# Patient Record
Sex: Male | Born: 1955 | Race: White | Hispanic: No | Marital: Married | Smoking: Former smoker
Health system: Southern US, Community
[De-identification: ages and names within clinical notes are randomized; demographics above are authoritative.]

## PROBLEM LIST (undated history)

## (undated) DIAGNOSIS — K2270 Barrett's esophagus without dysplasia: Secondary | ICD-10-CM

## (undated) DIAGNOSIS — C801 Malignant (primary) neoplasm, unspecified: Secondary | ICD-10-CM

## (undated) DIAGNOSIS — R7303 Prediabetes: Secondary | ICD-10-CM

## (undated) DIAGNOSIS — B029 Zoster without complications: Secondary | ICD-10-CM

## (undated) DIAGNOSIS — I209 Angina pectoris, unspecified: Secondary | ICD-10-CM

## (undated) DIAGNOSIS — E785 Hyperlipidemia, unspecified: Secondary | ICD-10-CM

## (undated) DIAGNOSIS — J189 Pneumonia, unspecified organism: Secondary | ICD-10-CM

## (undated) DIAGNOSIS — M795 Residual foreign body in soft tissue: Secondary | ICD-10-CM

## (undated) DIAGNOSIS — F32A Depression, unspecified: Secondary | ICD-10-CM

## (undated) DIAGNOSIS — M1990 Unspecified osteoarthritis, unspecified site: Secondary | ICD-10-CM

## (undated) DIAGNOSIS — G709 Myoneural disorder, unspecified: Secondary | ICD-10-CM

## (undated) DIAGNOSIS — Z87891 Personal history of nicotine dependence: Secondary | ICD-10-CM

## (undated) DIAGNOSIS — G35D Multiple sclerosis, unspecified: Secondary | ICD-10-CM

## (undated) DIAGNOSIS — B019 Varicella without complication: Secondary | ICD-10-CM

## (undated) DIAGNOSIS — I7143 Infrarenal abdominal aortic aneurysm, without rupture: Secondary | ICD-10-CM

## (undated) DIAGNOSIS — K219 Gastro-esophageal reflux disease without esophagitis: Secondary | ICD-10-CM

## (undated) DIAGNOSIS — I714 Abdominal aortic aneurysm, without rupture, unspecified: Secondary | ICD-10-CM

## (undated) DIAGNOSIS — Z87442 Personal history of urinary calculi: Secondary | ICD-10-CM

## (undated) DIAGNOSIS — G35 Multiple sclerosis: Secondary | ICD-10-CM

## (undated) DIAGNOSIS — I10 Essential (primary) hypertension: Secondary | ICD-10-CM

## (undated) DIAGNOSIS — H269 Unspecified cataract: Secondary | ICD-10-CM

## (undated) DIAGNOSIS — J45909 Unspecified asthma, uncomplicated: Secondary | ICD-10-CM

## (undated) DIAGNOSIS — K635 Polyp of colon: Secondary | ICD-10-CM

## (undated) DIAGNOSIS — J449 Chronic obstructive pulmonary disease, unspecified: Secondary | ICD-10-CM

## (undated) DIAGNOSIS — K5909 Other constipation: Secondary | ICD-10-CM

## (undated) DIAGNOSIS — R0789 Other chest pain: Secondary | ICD-10-CM

## (undated) DIAGNOSIS — Z7960 Long term (current) use of unspecified immunomodulators and immunosuppressants: Secondary | ICD-10-CM

## (undated) DIAGNOSIS — N400 Enlarged prostate without lower urinary tract symptoms: Secondary | ICD-10-CM

## (undated) DIAGNOSIS — Z79899 Other long term (current) drug therapy: Secondary | ICD-10-CM

## (undated) DIAGNOSIS — N200 Calculus of kidney: Secondary | ICD-10-CM

## (undated) DIAGNOSIS — I2510 Atherosclerotic heart disease of native coronary artery without angina pectoris: Secondary | ICD-10-CM

## (undated) HISTORY — PX: FRACTURE SURGERY: SHX138

## (undated) HISTORY — PX: SEPTOPLASTY: SUR1290

## (undated) HISTORY — PX: WRIST FRACTURE SURGERY: SHX121

## (undated) HISTORY — DX: Calculus of kidney: N20.0

## (undated) HISTORY — PX: CARDIAC CATHETERIZATION: SHX172

## (undated) HISTORY — PX: JOINT REPLACEMENT: SHX530

## (undated) HISTORY — PX: EYE SURGERY: SHX253

---
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
## 1977-03-26 HISTORY — PX: APPENDECTOMY: SHX54

## 2007-06-20 ENCOUNTER — Inpatient Hospital Stay: Payer: Self-pay | Admitting: Psychiatry

## 2007-06-20 ENCOUNTER — Other Ambulatory Visit: Payer: Self-pay

## 2007-06-24 ENCOUNTER — Emergency Department: Payer: Self-pay | Admitting: Emergency Medicine

## 2007-06-24 ENCOUNTER — Ambulatory Visit: Payer: Self-pay | Admitting: Unknown Physician Specialty

## 2007-06-25 ENCOUNTER — Ambulatory Visit: Payer: Self-pay | Admitting: Unknown Physician Specialty

## 2007-07-14 DIAGNOSIS — I2510 Atherosclerotic heart disease of native coronary artery without angina pectoris: Secondary | ICD-10-CM

## 2007-07-14 HISTORY — PX: LEFT HEART CATH AND CORONARY ANGIOGRAPHY: CATH118249

## 2007-07-14 HISTORY — DX: Atherosclerotic heart disease of native coronary artery without angina pectoris: I25.10

## 2007-07-18 ENCOUNTER — Ambulatory Visit: Payer: Self-pay | Admitting: Internal Medicine

## 2007-07-25 ENCOUNTER — Ambulatory Visit: Payer: Self-pay | Admitting: Unknown Physician Specialty

## 2007-08-06 ENCOUNTER — Ambulatory Visit: Payer: Self-pay

## 2008-02-05 ENCOUNTER — Ambulatory Visit: Payer: Self-pay | Admitting: Gastroenterology

## 2008-02-05 HISTORY — PX: ESOPHAGOGASTRODUODENOSCOPY ENDOSCOPY: SHX5814

## 2008-02-05 HISTORY — PX: COLONOSCOPY: SHX174

## 2008-02-05 HISTORY — PX: TONSILLECTOMY: SUR1361

## 2008-03-26 HISTORY — PX: CARDIAC CATHETERIZATION: SHX172

## 2010-08-22 ENCOUNTER — Other Ambulatory Visit: Payer: Self-pay | Admitting: Unknown Physician Specialty

## 2010-12-20 DIAGNOSIS — I2510 Atherosclerotic heart disease of native coronary artery without angina pectoris: Secondary | ICD-10-CM

## 2010-12-20 DIAGNOSIS — I259 Chronic ischemic heart disease, unspecified: Secondary | ICD-10-CM

## 2010-12-20 DIAGNOSIS — Z8249 Family history of ischemic heart disease and other diseases of the circulatory system: Secondary | ICD-10-CM | POA: Insufficient documentation

## 2010-12-20 DIAGNOSIS — Z720 Tobacco use: Secondary | ICD-10-CM | POA: Insufficient documentation

## 2010-12-20 DIAGNOSIS — K219 Gastro-esophageal reflux disease without esophagitis: Secondary | ICD-10-CM | POA: Insufficient documentation

## 2010-12-20 DIAGNOSIS — F32A Depression, unspecified: Secondary | ICD-10-CM | POA: Insufficient documentation

## 2010-12-20 DIAGNOSIS — IMO0002 Reserved for concepts with insufficient information to code with codable children: Secondary | ICD-10-CM | POA: Insufficient documentation

## 2010-12-20 DIAGNOSIS — E785 Hyperlipidemia, unspecified: Secondary | ICD-10-CM | POA: Insufficient documentation

## 2010-12-20 DIAGNOSIS — F329 Major depressive disorder, single episode, unspecified: Secondary | ICD-10-CM | POA: Insufficient documentation

## 2010-12-20 HISTORY — DX: Atherosclerotic heart disease of native coronary artery without angina pectoris: I25.10

## 2010-12-20 HISTORY — DX: Chronic ischemic heart disease, unspecified: I25.9

## 2010-12-27 ENCOUNTER — Ambulatory Visit: Payer: Self-pay | Admitting: Unknown Physician Specialty

## 2010-12-27 DIAGNOSIS — R072 Precordial pain: Secondary | ICD-10-CM

## 2011-01-02 ENCOUNTER — Ambulatory Visit: Payer: Self-pay | Admitting: Unknown Physician Specialty

## 2011-01-02 HISTORY — PX: KNEE ARTHROSCOPY W/ PARTIAL MEDIAL MENISCECTOMY: SHX1882

## 2011-01-02 HISTORY — PX: ARTHROPLASTY: SHX135

## 2012-01-31 DIAGNOSIS — G35 Multiple sclerosis: Secondary | ICD-10-CM | POA: Insufficient documentation

## 2012-04-07 DIAGNOSIS — R0600 Dyspnea, unspecified: Secondary | ICD-10-CM | POA: Insufficient documentation

## 2013-06-08 DIAGNOSIS — Z79899 Other long term (current) drug therapy: Secondary | ICD-10-CM | POA: Insufficient documentation

## 2013-08-27 DIAGNOSIS — S5420XA Injury of radial nerve at forearm level, unspecified arm, initial encounter: Secondary | ICD-10-CM | POA: Insufficient documentation

## 2013-08-31 ENCOUNTER — Ambulatory Visit: Payer: Self-pay | Admitting: Gastroenterology

## 2013-09-02 LAB — PATHOLOGY REPORT

## 2015-03-11 ENCOUNTER — Other Ambulatory Visit: Payer: Self-pay | Admitting: Orthopedic Surgery

## 2015-03-11 DIAGNOSIS — M25561 Pain in right knee: Secondary | ICD-10-CM

## 2015-03-11 DIAGNOSIS — M1711 Unilateral primary osteoarthritis, right knee: Secondary | ICD-10-CM

## 2015-03-11 DIAGNOSIS — M25621 Stiffness of right elbow, not elsewhere classified: Secondary | ICD-10-CM

## 2015-03-16 ENCOUNTER — Ambulatory Visit
Admission: RE | Admit: 2015-03-16 | Discharge: 2015-03-16 | Disposition: A | Discharge status: Home / Self Care | Payer: BLUE CROSS/BLUE SHIELD | Source: Ambulatory Visit | Attending: Orthopedic Surgery | Admitting: Orthopedic Surgery

## 2015-03-16 DIAGNOSIS — X58XXXD Exposure to other specified factors, subsequent encounter: Secondary | ICD-10-CM | POA: Insufficient documentation

## 2015-03-16 DIAGNOSIS — M25561 Pain in right knee: Secondary | ICD-10-CM

## 2015-03-16 DIAGNOSIS — M1711 Unilateral primary osteoarthritis, right knee: Secondary | ICD-10-CM

## 2015-03-16 DIAGNOSIS — M25621 Stiffness of right elbow, not elsewhere classified: Secondary | ICD-10-CM

## 2015-03-16 DIAGNOSIS — S50351D Superficial foreign body of right elbow, subsequent encounter: Secondary | ICD-10-CM | POA: Diagnosis present

## 2015-04-11 DIAGNOSIS — M19121 Post-traumatic osteoarthritis, right elbow: Secondary | ICD-10-CM | POA: Insufficient documentation

## 2015-04-11 DIAGNOSIS — M24621 Ankylosis, right elbow: Secondary | ICD-10-CM | POA: Insufficient documentation

## 2015-05-27 ENCOUNTER — Other Ambulatory Visit: Payer: Self-pay | Admitting: Orthopedic Surgery

## 2015-05-27 DIAGNOSIS — M25561 Pain in right knee: Secondary | ICD-10-CM

## 2015-05-27 DIAGNOSIS — M1711 Unilateral primary osteoarthritis, right knee: Secondary | ICD-10-CM

## 2015-06-20 ENCOUNTER — Ambulatory Visit
Admission: RE | Admit: 2015-06-20 | Discharge: 2015-06-20 | Disposition: A | Discharge status: Home / Self Care | Payer: BLUE CROSS/BLUE SHIELD | Source: Ambulatory Visit | Attending: Orthopedic Surgery | Admitting: Orthopedic Surgery

## 2015-06-20 DIAGNOSIS — M1711 Unilateral primary osteoarthritis, right knee: Secondary | ICD-10-CM

## 2015-06-20 DIAGNOSIS — Z9889 Other specified postprocedural states: Secondary | ICD-10-CM | POA: Diagnosis not present

## 2015-06-20 DIAGNOSIS — M7121 Synovial cyst of popliteal space [Baker], right knee: Secondary | ICD-10-CM | POA: Diagnosis not present

## 2015-06-20 DIAGNOSIS — M25561 Pain in right knee: Secondary | ICD-10-CM | POA: Diagnosis present

## 2015-06-20 DIAGNOSIS — M25461 Effusion, right knee: Secondary | ICD-10-CM | POA: Insufficient documentation

## 2015-06-20 DIAGNOSIS — M659 Synovitis and tenosynovitis, unspecified: Secondary | ICD-10-CM | POA: Diagnosis not present

## 2015-11-24 ENCOUNTER — Other Ambulatory Visit: Payer: Self-pay | Admitting: Otolaryngology

## 2015-11-24 DIAGNOSIS — M542 Cervicalgia: Secondary | ICD-10-CM

## 2015-12-02 ENCOUNTER — Ambulatory Visit
Admission: RE | Admit: 2015-12-02 | Discharge: 2015-12-02 | Disposition: A | Discharge status: Home / Self Care | Payer: BLUE CROSS/BLUE SHIELD | Source: Ambulatory Visit | Attending: Otolaryngology | Admitting: Otolaryngology

## 2015-12-02 DIAGNOSIS — M542 Cervicalgia: Secondary | ICD-10-CM | POA: Diagnosis not present

## 2016-08-23 ENCOUNTER — Ambulatory Visit
Admission: RE | Admit: 2016-08-23 | Discharge: 2016-08-23 | Disposition: A | Discharge status: Home / Self Care | Payer: BLUE CROSS/BLUE SHIELD | Source: Ambulatory Visit | Attending: Gastroenterology | Admitting: Gastroenterology

## 2016-08-23 ENCOUNTER — Encounter
Admission: RE | Disposition: A | Discharge status: Home / Self Care | Payer: Self-pay | Source: Ambulatory Visit | Attending: Gastroenterology

## 2016-08-23 ENCOUNTER — Ambulatory Visit: Payer: BLUE CROSS/BLUE SHIELD | Admitting: Anesthesiology

## 2016-08-23 ENCOUNTER — Encounter: Payer: Self-pay | Admitting: *Deleted

## 2016-08-23 DIAGNOSIS — I493 Ventricular premature depolarization: Secondary | ICD-10-CM | POA: Diagnosis not present

## 2016-08-23 DIAGNOSIS — Z7982 Long term (current) use of aspirin: Secondary | ICD-10-CM | POA: Insufficient documentation

## 2016-08-23 DIAGNOSIS — Z86010 Personal history of colonic polyps: Secondary | ICD-10-CM | POA: Diagnosis not present

## 2016-08-23 DIAGNOSIS — K219 Gastro-esophageal reflux disease without esophagitis: Secondary | ICD-10-CM | POA: Insufficient documentation

## 2016-08-23 DIAGNOSIS — K2970 Gastritis, unspecified, without bleeding: Secondary | ICD-10-CM | POA: Insufficient documentation

## 2016-08-23 DIAGNOSIS — K2270 Barrett's esophagus without dysplasia: Secondary | ICD-10-CM | POA: Insufficient documentation

## 2016-08-23 DIAGNOSIS — I2510 Atherosclerotic heart disease of native coronary artery without angina pectoris: Secondary | ICD-10-CM | POA: Diagnosis not present

## 2016-08-23 DIAGNOSIS — D125 Benign neoplasm of sigmoid colon: Secondary | ICD-10-CM | POA: Insufficient documentation

## 2016-08-23 DIAGNOSIS — K621 Rectal polyp: Secondary | ICD-10-CM | POA: Insufficient documentation

## 2016-08-23 DIAGNOSIS — Z79899 Other long term (current) drug therapy: Secondary | ICD-10-CM | POA: Diagnosis not present

## 2016-08-23 DIAGNOSIS — D123 Benign neoplasm of transverse colon: Secondary | ICD-10-CM | POA: Insufficient documentation

## 2016-08-23 DIAGNOSIS — G35 Multiple sclerosis: Secondary | ICD-10-CM | POA: Insufficient documentation

## 2016-08-23 DIAGNOSIS — K5909 Other constipation: Secondary | ICD-10-CM | POA: Insufficient documentation

## 2016-08-23 DIAGNOSIS — Z800 Family history of malignant neoplasm of digestive organs: Secondary | ICD-10-CM | POA: Diagnosis not present

## 2016-08-23 DIAGNOSIS — E785 Hyperlipidemia, unspecified: Secondary | ICD-10-CM | POA: Insufficient documentation

## 2016-08-23 DIAGNOSIS — Z87891 Personal history of nicotine dependence: Secondary | ICD-10-CM | POA: Diagnosis not present

## 2016-08-23 DIAGNOSIS — D126 Benign neoplasm of colon, unspecified: Secondary | ICD-10-CM

## 2016-08-23 DIAGNOSIS — Z1211 Encounter for screening for malignant neoplasm of colon: Secondary | ICD-10-CM | POA: Insufficient documentation

## 2016-08-23 DIAGNOSIS — D124 Benign neoplasm of descending colon: Secondary | ICD-10-CM | POA: Diagnosis not present

## 2016-08-23 HISTORY — DX: Other constipation: K59.09

## 2016-08-23 HISTORY — DX: Zoster without complications: B02.9

## 2016-08-23 HISTORY — DX: Barrett's esophagus without dysplasia: K22.70

## 2016-08-23 HISTORY — PX: ESOPHAGOGASTRODUODENOSCOPY (EGD) WITH PROPOFOL: SHX5813

## 2016-08-23 HISTORY — DX: Unspecified cataract: H26.9

## 2016-08-23 HISTORY — DX: Other chest pain: R07.89

## 2016-08-23 HISTORY — DX: Benign neoplasm of colon, unspecified: D12.6

## 2016-08-23 HISTORY — DX: Varicella without complication: B01.9

## 2016-08-23 HISTORY — DX: Gastro-esophageal reflux disease without esophagitis: K21.9

## 2016-08-23 HISTORY — PX: COLONOSCOPY WITH PROPOFOL: SHX5780

## 2016-08-23 HISTORY — DX: Atherosclerotic heart disease of native coronary artery without angina pectoris: I25.10

## 2016-08-23 HISTORY — DX: Multiple sclerosis: G35

## 2016-08-23 HISTORY — DX: Residual foreign body in soft tissue: M79.5

## 2016-08-23 HISTORY — DX: Hyperlipidemia, unspecified: E78.5

## 2016-08-23 HISTORY — DX: Multiple sclerosis, unspecified: G35.D

## 2016-08-23 LAB — CBC WITH DIFFERENTIAL/PLATELET
Basophils Absolute: 0 10*3/uL (ref 0–0.1)
Basophils Relative: 1 %
EOS ABS: 0.2 10*3/uL (ref 0–0.7)
EOS PCT: 4 %
HCT: 44.1 % (ref 40.0–52.0)
Hemoglobin: 14.9 g/dL (ref 13.0–18.0)
LYMPHS ABS: 0.1 10*3/uL — AB (ref 1.0–3.6)
Lymphocytes Relative: 4 %
MCH: 30.7 pg (ref 26.0–34.0)
MCHC: 33.8 g/dL (ref 32.0–36.0)
MCV: 90.8 fL (ref 80.0–100.0)
MONOS PCT: 15 %
Monocytes Absolute: 0.6 10*3/uL (ref 0.2–1.0)
Neutro Abs: 3 10*3/uL (ref 1.4–6.5)
Neutrophils Relative %: 76 %
PLATELETS: 215 10*3/uL (ref 150–440)
RBC: 4.86 MIL/uL (ref 4.40–5.90)
RDW: 14.2 % (ref 11.5–14.5)
WBC: 3.9 10*3/uL (ref 3.8–10.6)

## 2016-08-23 SURGERY — COLONOSCOPY WITH PROPOFOL
Anesthesia: General

## 2016-08-23 MED ORDER — PROPOFOL 500 MG/50ML IV EMUL
INTRAVENOUS | Status: DC | PRN
  Administered 2016-08-23: 150 ug/kg/min via INTRAVENOUS

## 2016-08-23 MED ORDER — PHENYLEPHRINE HCL 10 MG/ML IJ SOLN
INTRAMUSCULAR | Status: DC | PRN
  Administered 2016-08-23 (×2): 100 ug via INTRAVENOUS

## 2016-08-23 MED ORDER — PROPOFOL 10 MG/ML IV BOLUS
INTRAVENOUS | Status: DC | PRN
  Administered 2016-08-23: 80 mg via INTRAVENOUS
  Administered 2016-08-23: 20 mg via INTRAVENOUS

## 2016-08-23 MED ORDER — SODIUM CHLORIDE 0.9 % IV SOLN
INTRAVENOUS | Status: DC
  Administered 2016-08-23 (×3): via INTRAVENOUS

## 2016-08-23 MED ORDER — PROPOFOL 500 MG/50ML IV EMUL
INTRAVENOUS | Status: AC
  Filled 2016-08-23: qty 50

## 2016-08-23 MED ORDER — SODIUM CHLORIDE 0.9 % IV SOLN: INTRAVENOUS | Status: DC

## 2016-08-23 NOTE — Anesthesia Postprocedure Evaluation (Signed)
Anesthesia Post Note  Patient: [name]  Procedure(s) Performed: Procedure(s) (LRB): COLONOSCOPY WITH PROPOFOL (N/A) ESOPHAGOGASTRODUODENOSCOPY (EGD) WITH PROPOFOL (N/A)  Patient location during evaluation: Endoscopy Anesthesia Type: General Level of consciousness: awake and alert Pain management: pain level controlled Vital Signs Assessment: post-procedure vital signs reviewed and stable Respiratory status: spontaneous breathing and respiratory function stable Cardiovascular status: stable Anesthetic complications: no     Last Vitals:  Vitals:   08/23/16 1120 08/23/16 1130  BP: (!) 83/48 (!) 94/58  Pulse: (!) 56 (!) 50  Resp: 18 12  Temp: 36.1 C     Last Pain:  Vitals:   08/23/16 1120  TempSrc: Tympanic                 Aceson Labell K

## 2016-08-23 NOTE — H&P (Signed)
Outpatient short stay form Pre-procedure 08/23/2016 10:07 AM Lollie Sails MD  Primary Physician: Dr Derinda Late  Reason for visit:  EGD and colonoscopy  History of present illness:  Patient is a 61 year old male presenting today as above. He has personal history of adenomatous colon polyps as well as a family history of a cancer in his father. It was thought to be possibly GI related although not completely certain. Patient also has a personal history of Barrett's esophagus. He is taking omeprazole twice a day with good control symptoms. Does have multiple sclerosis and may contribute to some reflux or dysphagia issues. He tolerated his prep well. He takes no blood thinning agents. He does take 81 mg aspirin but has not for several days.    Current Facility-Administered Medications:  .  0.9 %  sodium chloride infusion, , Intravenous, Continuous, Lollie Sails, MD, Last Rate: 20 mL/hr at 08/23/16 0912 .  0.9 %  sodium chloride infusion, , Intravenous, Continuous, Lollie Sails, MD  Prescriptions Prior to Admission  Medication Sig Dispense Refill Last Dose  . acetaminophen (TYLENOL) 500 MG tablet Take 1,000 mg by mouth every 6 (six) hours as needed.   Past Week at Unknown time  . amphetamine-dextroamphetamine (ADDERALL XR) 25 MG 24 hr capsule Take 25 mg by mouth every morning.   Past Week at Unknown time  . aspirin EC 81 MG tablet Take 81 mg by mouth daily.   Past Week at Unknown time  . baclofen (LIORESAL) 20 MG tablet Take 20 mg by mouth 3 (three) times daily.   08/22/2016 at Unknown time  . cholecalciferol (VITAMIN D) 1000 units tablet Take 1,000 Units by mouth daily. 2 capsules daily (2,000 units)   Past Week at Unknown time  . Fingolimod HCl (GILENYA) 0.5 MG CAPS Take by mouth every morning.   08/22/2016 at Unknown time  . metoprolol tartrate (LOPRESSOR) 25 MG tablet Take 25 mg by mouth every morning.   08/22/2016 at Unknown time  . omeprazole (PRILOSEC) 20 MG capsule Take  20 mg by mouth 2 (two) times daily before a meal.   08/22/2016 at Unknown time  . simvastatin (ZOCOR) 40 MG tablet Take 40 mg by mouth daily.   08/22/2016 at Unknown time  . traZODone (DESYREL) 100 MG tablet Take 200 mg by mouth at bedtime.   08/22/2016 at Unknown time  . albuterol (PROVENTIL HFA;VENTOLIN HFA) 108 (90 Base) MCG/ACT inhaler Inhale 2 puffs into the lungs every 4 (four) hours as needed for wheezing or shortness of breath.   Not Taking at Unknown time  . amoxicillin (AMOXIL) 875 MG tablet Take 875 mg by mouth 2 (two) times daily. Every 12 hours x 7 days   Completed Course at Unknown time  . benzonatate (TESSALON) 200 MG capsule Take 200 mg by mouth 3 (three) times daily as needed for cough.   Completed Course at Unknown time  . predniSONE (STERAPRED UNI-PAK 21 TAB) 10 MG (21) TBPK tablet Take 10 mg by mouth daily. 6 po qd x 1 day, then 5 po qd x 1 day, then 4 po x 1 day, then 3 po x 1 day, then 2 po x 1 day, then 1 po x 1 day.   Completed Course at Unknown time     No Known Allergies   Past Medical History:  Diagnosis Date  . Atypical chest pain   . Barrett's esophagus   . Bilateral cataracts   . Chicken pox   . Chronic  constipation   . Coronary artery disease   . Foreign body (FB) in soft tissue   . GERD (gastroesophageal reflux disease)   . Hyperlipidemia   . MS (multiple sclerosis) ([place])   . Shingles     Review of systems:      Physical Exam    Heart and lungs: Regular rate and rhythm without rub or gallop, lungs are bilaterally clear.    HEENT: Normal cephalic atraumatic eyes are anicteric    Other:     Pertinant exam for procedure: Soft nontender nondistended bowel sounds positive normoactive    Planned proceedures: EGD, colonoscopy and indicated procedures. I have discussed the risks benefits and complications of procedures to include not limited to bleeding, infection, perforation and the risk of sedation and the patient wishes to proceed.    Lollie Sails, MD Gastroenterology 08/23/2016  10:07 AM

## 2016-08-23 NOTE — Anesthesia Preprocedure Evaluation (Signed)
Anesthesia Evaluation  Patient identified by MRN, date of birth, ID band Patient awake    Reviewed: Allergy & Precautions, NPO status , Patient's Chart, lab work & pertinent test results, reviewed documented beta blocker date and time   History of Anesthesia Complications Negative for: history of anesthetic complications  Airway Mallampati: II       Dental   Pulmonary former smoker,           Cardiovascular Pt. on home beta blockers + CAD  + dysrhythmias (PVCs)      Neuro/Psych    GI/Hepatic Neg liver ROS, GERD  Medicated,  Endo/Other  negative endocrine ROS  Renal/GU negative Renal ROS     Musculoskeletal   Abdominal   Peds  Hematology negative hematology ROS (+)   Anesthesia Other Findings   Reproductive/Obstetrics                             Anesthesia Physical Anesthesia Plan  ASA: II  Anesthesia Plan: General   Post-op Pain Management:    Induction: Intravenous  Airway Management Planned: Nasal Cannula  Additional Equipment:   Intra-op Plan:   Post-operative Plan:   Informed Consent: I have reviewed the patients History and Physical, chart, labs and discussed the procedure including the risks, benefits and alternatives for the proposed anesthesia with the patient or authorized representative who has indicated his/her understanding and acceptance.     Plan Discussed with:   Anesthesia Plan Comments:         Anesthesia Quick Evaluation

## 2016-08-23 NOTE — Anesthesia Post-op Follow-up Note (Cosign Needed)
Anesthesia QCDR form completed.        

## 2016-08-23 NOTE — Transfer of Care (Signed)
Immediate Anesthesia Transfer of Care Note  Patient: Allen Tapia  Procedure(s) Performed: Procedure(s): COLONOSCOPY WITH PROPOFOL (N/A) ESOPHAGOGASTRODUODENOSCOPY (EGD) WITH PROPOFOL (N/A)  Patient Location: PACU  Anesthesia Type:General  Level of Consciousness: sedated  Airway & Oxygen Therapy: Patient Spontanous Breathing and Patient connected to nasal cannula oxygen  Post-op Assessment: Report given to RN and Post -op Vital signs reviewed and stable  Post vital signs: Reviewed and stable  Last Vitals:  Vitals:   08/23/16 0822 08/23/16 1120  BP: 136/78 (!) 83/48  Pulse: 60 (!) 56  Resp:  18  Temp: (!) 36.1 C 36.1 C    Last Pain:  Vitals:   08/23/16 1120  TempSrc: Tympanic         Complications: No apparent anesthesia complications

## 2016-08-23 NOTE — Op Note (Signed)
[facility] Gastroenterology Patient Name: Allen Tapia Procedure Date: 08/23/2016 10:07 AM MRN: [id number] Account #: [id number] Date of Birth: 06-May-1955 Admit Type: Outpatient Age: 61 Room: [facility] ENDO ROOM 1 Gender: Male Note Status: Finalized Procedure:            Colonoscopy Indications:          Personal history of colonic polyps Providers:            Lollie Sails, MD Referring MD:         No Local Md, MD (Referring MD) Medicines:            Monitored Anesthesia Care Complications:        No immediate complications. Procedure:            Pre-Anesthesia Assessment:                       - ASA Grade Assessment: III - A patient with severe                        systemic disease.                       After obtaining informed consent, the colonoscope was                        passed under direct vision. Throughout the procedure,                        the patient's blood pressure, pulse, and oxygen                        saturations were monitored continuously. The                        Colonoscope was introduced through the anus and                        advanced to the the cecum, identified by appendiceal                        orifice and ileocecal valve. The colonoscopy was                        performed without difficulty. The patient tolerated the                        procedure well. The quality of the bowel preparation                        was fair. Findings:      A 4 mm polyp was found in the distal sigmoid colon. The polyp was       sessile. The polyp was removed with a cold snare. Resection and       retrieval were complete.      A 4 mm polyp was found in the transverse colon. The polyp was sessile.       The polyp was removed with a cold snare. Resection and retrieval were       complete.      A 4 mm polyp was found in the distal descending colon. The polyp was  flat. The polyp was removed with a cold snare. Resection and  retrieval       were complete.      A 2 mm polyp was found in the descending colon. The polyp was sessile.       The polyp was removed with a cold biopsy forceps. Resection and       retrieval were complete.      Three sessile polyps were found in the rectum. The polyps were less than       1 mm in size. These polyps were removed with a cold biopsy forceps.       Resection and retrieval were complete.      The retroflexed view of the distal rectum and anal verge was normal and       showed no anal or rectal abnormalities.      The digital rectal exam was normal. Impression:           - Preparation of the colon was fair.                       - One 4 mm polyp in the distal sigmoid colon, removed                        with a cold snare. Resected and retrieved.                       - One 4 mm polyp in the transverse colon, removed with                        a cold snare. Resected and retrieved.                       - One 4 mm polyp in the distal descending colon,                        removed with a cold snare. Resected and retrieved.                       - One 2 mm polyp in the descending colon, removed with                        a cold biopsy forceps. Resected and retrieved.                       - Three less than 1 mm polyps in the rectum, removed                        with a cold biopsy forceps. Resected and retrieved.                       - The distal rectum and anal verge are normal on                        retroflexion view. Recommendation:       - Await pathology results.                       - Telephone GI clinic for pathology results in 1 week. Procedure Code(s):    --- Professional ---  45385, Colonoscopy, flexible; with removal of tumor(s),                        polyp(s), or other lesion(s) by snare technique                       45380, 59, Colonoscopy, flexible; with biopsy, single                        or multiple Diagnosis Code(s):    ---  Professional ---                       D12.5, Benign neoplasm of sigmoid colon                       D12.3, Benign neoplasm of transverse colon (hepatic                        flexure or splenic flexure)                       D12.4, Benign neoplasm of descending colon                       K62.1, Rectal polyp                       Z86.010, Personal history of colonic polyps CPT copyright 2016 American Medical Association. All rights reserved. The codes documented in this report are preliminary and upon coder review may  be revised to meet current compliance requirements. Lollie Sails, MD 08/23/2016 11:18:56 AM This report has been signed electronically. Number of Addenda: 0 Note Initiated On: 08/23/2016 10:07 AM Scope Withdrawal Time: 0 hours 22 minutes 22 seconds  Total Procedure Duration: 0 hours 39 minutes 38 seconds       [facility]

## 2016-08-23 NOTE — Anesthesia Procedure Notes (Signed)
Performed by: Nelda Marseille Pre-anesthesia Checklist: Patient identified, Suction available, Emergency Drugs available, Patient being monitored and Timeout performed Oxygen Delivery Method: Nasal cannula

## 2016-08-23 NOTE — Op Note (Signed)
[facility]astroenter[facility] Patient Name: Allen Tapia Procedure Date: 08/23/2016 10:08 AM MRN: [id number] Account #: [id number] Date of Birth: Apr 16, 1955 Admit Type: Outpatient Age: 61 Room: [facility] ENDO ROOM 1 Gender: Male Note Status: Finalized Procedure:            Upper GI endoscopy Indications:          Follow-up of Barrett's esophagus Providers:            Lollie Sails, MD Referring MD:         No Local Md, MD (Referring MD) Medicines:            Monitored Anesthesia Care Complications:        No immediate complications. Procedure:            Pre-Anesthesia Assessment:                       - ASA Grade Assessment: III - A patient with severe                        systemic disease.                       After obtaining informed consent, the endoscope was                        passed under direct vision. Throughout the procedure,                        the patient's blood pressure, pulse, and oxygen                        saturations were monitored continuously. The Endoscope                        was introduced through the mouth, and advanced to the                        third part of duodenum. The upper GI endoscopy was                        accomplished without difficulty. The patient tolerated                        the procedure well. Findings:      The Z-line was variable. Mucosa was biopsied with a cold forceps for       histology in 4 quadrants at the gastroesophageal junction. One specimen       bottle was sent to pathology.      Patchy mild inflammation characterized by congestion (edema) and       erythema was found in the upper gastric body and in the gastric antrum.       Biopsies were taken with a cold forceps for histology. Biopsies were       taken with a cold forceps for Helicobacter pylori testing.      The cardia and gastric fundus were normal on retroflexion.      Patchy minimal erythematous mucosa without active bleeding and  with no       stigmata of bleeding was found in the duodenal bulb. Impression:           - Z-line  variable. Biopsied.                       - Gastritis. Biopsied. Recommendation:       - Return to GI clinic in 1 month.                       - Continue present medications.                       - Await pathology results. Procedure Code(s):    --- Professional ---                       [phone], Esophagogastroduodenoscopy, flexible, transoral;                        with biopsy, single or multiple Diagnosis Code(s):    --- Professional ---                       K22.8, Other specified diseases of esophagus                       K29.70, Gastritis, unspecified, without bleeding                       K22.70, Barrett's esophagus without dysplasia CPT copyright 2016 American Medical Association. All rights reserved. The codes documented in this report are preliminary and upon coder review may  be revised to meet current compliance requirements. Lollie Sails, MD 08/23/2016 10:32:02 AM This report has been signed electronically. Number of Addenda: 0 Note Initiated On: 08/23/2016 10:08 AM      [facility]

## 2016-08-24 ENCOUNTER — Encounter: Payer: Self-pay | Admitting: Gastroenterology

## 2016-08-24 LAB — SURGICAL PATHOLOGY

## 2016-10-10 ENCOUNTER — Encounter: Payer: Self-pay | Admitting: Podiatry

## 2016-10-10 ENCOUNTER — Ambulatory Visit (INDEPENDENT_AMBULATORY_CARE_PROVIDER_SITE_OTHER): Payer: BLUE CROSS/BLUE SHIELD

## 2016-10-10 ENCOUNTER — Ambulatory Visit (INDEPENDENT_AMBULATORY_CARE_PROVIDER_SITE_OTHER): Payer: BLUE CROSS/BLUE SHIELD | Admitting: Podiatry

## 2016-10-10 DIAGNOSIS — K5909 Other constipation: Secondary | ICD-10-CM | POA: Insufficient documentation

## 2016-10-10 DIAGNOSIS — K2270 Barrett's esophagus without dysplasia: Secondary | ICD-10-CM | POA: Insufficient documentation

## 2016-10-10 DIAGNOSIS — M722 Plantar fascial fibromatosis: Secondary | ICD-10-CM

## 2016-10-10 MED ORDER — METHYLPREDNISOLONE 4 MG PO TBPK: ORAL_TABLET | ORAL | 0 refills | Status: DC

## 2016-10-10 MED ORDER — MELOXICAM 15 MG PO TABS: 15.0000 mg | ORAL_TABLET | Freq: Every day | ORAL | 3 refills | Status: DC

## 2016-10-10 NOTE — Patient Instructions (Signed)

## 2016-10-10 NOTE — Progress Notes (Signed)
   Subjective:    Patient ID: Allen Tapia, male    DOB: 21-Jan-1956, 61 y.o.   MRN: [id number]  HPI: He presents today with chief complaint of plantar heel pain right and mid foot heel pain left he says been aching for several months mornings are particularly bad he also has numbness in his feet and other parts of his body as well. States that he's noticed more falling recently with his MS and he takes Tylenol.    Review of Systems  All other systems reviewed and are negative.      Objective:   Physical Exam: Vital signs are stable alert and oriented 3. Pulses are palpable. Neurologic sensorium is intact. Deep tendon reflexes are intact. Muscle strength is 5 over 5 dorsiflexion plantar flexors and inverters everters all intrinsic musculature is intact. Orthopedic evaluation demonstrates pain on palpation medial calcaneal tubercles bilateral. Some tenderness on palpation of the dorsal lateral aspect of the left foot overlying lateral Lisfranc's joints possibly associated compensation.  Radiographs taken today demonstrate osseous mature individual with no major osseous abnormalities. Mild pes planus is noted with soft-tissue increase in density at the plantar fascial calcaneal insertion sites bilaterally consistent with plantar fasciitis.     Assessment & Plan:  Assessment: Plantar fasciitis bilateral lateral compensatory syndrome.  Plan: Discussed etiology pathology conservative or surgical clips. At this point start him on a Medrol Dosepak to be followed by meloxicam. Injected the bilateral heels today with Kenalog and local anesthetic started him with plantar fascial braces and the night splint. Discussed appropriate shoe gear stretching exercises ice therapy shoe modifications I also put stretching exercises on his checkup sheet.

## 2016-11-14 ENCOUNTER — Ambulatory Visit: Payer: BLUE CROSS/BLUE SHIELD | Admitting: Podiatry

## 2016-12-05 ENCOUNTER — Ambulatory Visit: Payer: BLUE CROSS/BLUE SHIELD | Admitting: Podiatry

## 2017-01-07 ENCOUNTER — Ambulatory Visit (INDEPENDENT_AMBULATORY_CARE_PROVIDER_SITE_OTHER): Payer: BLUE CROSS/BLUE SHIELD | Admitting: Podiatry

## 2017-01-07 ENCOUNTER — Encounter: Payer: Self-pay | Admitting: Podiatry

## 2017-01-07 VITALS — BP 106/64 | HR 69 | Resp 16

## 2017-01-07 DIAGNOSIS — M722 Plantar fascial fibromatosis: Secondary | ICD-10-CM

## 2017-01-07 MED ORDER — MELOXICAM 15 MG PO TABS: 15.0000 mg | ORAL_TABLET | Freq: Every day | ORAL | 1 refills | Status: DC

## 2017-01-07 NOTE — Progress Notes (Signed)
   Subjective:    Patient ID: Allen Tapia, male    DOB: 1956-01-23, 62 y.o.   MRN: [id number]  HPI: He presents chief complaint plantar fasciitis resolving to about 75%. He states that he wears his braces and takes his medication. He is also concerned about discoloration of the toenail second digit of the right foot and a callus in that toe.    Review of Systems  All other systems reviewed and are negative.      Objective:   Physical Exam: Pulses are palpable. Hammertoe deformity noted second right. This is resulting in a distal clavus and a dystrophic second nail with some bleeding beneath it. No major abnormalities. Residual pain on palpation medial calcaneal tubercle bilateral        Assessment & Plan:  Plantar fasciitis resolves 75%. Hammertoe deformity second digit right foot. History of multiple sclerosis.  Plan: Reinject the bilateral heels today with Kenalog and local anesthetic he will follow-up with me in 4 weeks if necessary which time we'll discuss orthotics.

## 2017-02-11 ENCOUNTER — Ambulatory Visit: Payer: BLUE CROSS/BLUE SHIELD | Admitting: Podiatry

## 2017-03-13 ENCOUNTER — Ambulatory Visit: Payer: BLUE CROSS/BLUE SHIELD | Admitting: Podiatry

## 2017-05-15 ENCOUNTER — Ambulatory Visit: Payer: BLUE CROSS/BLUE SHIELD | Admitting: Podiatry

## 2017-05-15 ENCOUNTER — Encounter: Payer: Self-pay | Admitting: Podiatry

## 2017-05-15 DIAGNOSIS — M722 Plantar fascial fibromatosis: Secondary | ICD-10-CM

## 2017-05-15 NOTE — Progress Notes (Signed)
He presents today for follow-up of his plantar fasciitis.  States that it seems to be doing much better he does state approximately 85%.  Objective: Vital signs are stable he is alert and oriented x3.  Pulses are palpable.  He has pain on palpation medial calcaneal tubercles bilateral left greater than right no calf pain.  Assessment: Resolving plantar fasciitis.  Plan: I reinjected his bilateral heels today after sterile Betadine skin prep and verbal consent with 20 mg of Kenalog 5 mg Marcaine bilateral heel to the point of maximal tenderness medial aspect of the foot.  Tolerated procedure well without comp occasions.  He spoke to [place] today about getting a pair of orthotics.  He was scanned and they were ordered.

## 2017-06-12 ENCOUNTER — Encounter: Payer: BLUE CROSS/BLUE SHIELD | Admitting: Orthotics

## 2017-06-19 ENCOUNTER — Encounter: Payer: BLUE CROSS/BLUE SHIELD | Admitting: Orthotics

## 2017-06-19 ENCOUNTER — Ambulatory Visit: Payer: BLUE CROSS/BLUE SHIELD | Admitting: Orthotics

## 2017-06-19 DIAGNOSIS — M722 Plantar fascial fibromatosis: Secondary | ICD-10-CM

## 2017-06-19 NOTE — Progress Notes (Signed)
Patient came in today to pick up custom made foot orthotics.  The goals were accomplished and the patient reported no dissatisfaction with said orthotics.  Patient was advised of breakin period and how to report any issues. 

## 2018-06-11 ENCOUNTER — Other Ambulatory Visit: Payer: Self-pay

## 2018-06-11 ENCOUNTER — Ambulatory Visit (INDEPENDENT_AMBULATORY_CARE_PROVIDER_SITE_OTHER): Payer: Medicare Other | Admitting: Pulmonary Disease

## 2018-06-11 ENCOUNTER — Encounter: Payer: Self-pay | Admitting: Pulmonary Disease

## 2018-06-11 VITALS — BP 122/80 | HR 67 | Ht 74.0 in | Wt 191.0 lb

## 2018-06-11 DIAGNOSIS — Z8709 Personal history of other diseases of the respiratory system: Secondary | ICD-10-CM

## 2018-06-11 DIAGNOSIS — Z87891 Personal history of nicotine dependence: Secondary | ICD-10-CM

## 2018-06-11 DIAGNOSIS — R062 Wheezing: Secondary | ICD-10-CM

## 2018-06-11 DIAGNOSIS — R059 Cough, unspecified: Secondary | ICD-10-CM

## 2018-06-11 DIAGNOSIS — G35D Multiple sclerosis, unspecified: Secondary | ICD-10-CM

## 2018-06-11 DIAGNOSIS — J45998 Other asthma: Secondary | ICD-10-CM

## 2018-06-11 DIAGNOSIS — R05 Cough: Secondary | ICD-10-CM

## 2018-06-11 DIAGNOSIS — G35 Multiple sclerosis: Secondary | ICD-10-CM

## 2018-06-11 MED ORDER — AEROCHAMBER MV MISC: 0 refills | Status: AC

## 2018-06-11 MED ORDER — BUDESONIDE-FORMOTEROL FUMARATE 160-4.5 MCG/ACT IN AERO: 2.0000 | INHALATION_SPRAY | Freq: Two times a day (BID) | RESPIRATORY_TRACT | 0 refills | Status: DC

## 2018-06-11 NOTE — Progress Notes (Signed)
Synopsis: Referred in March 2020 for cough by Derinda Late, MD  Subjective:   PATIENT ID: Allen Tapia GENDER: male DOB: 05-12-1955, MRN: [id number]  Chief Complaint  Patient presents with   Pulmonary Consult    cough since jan 6, productive clear mucus, already been on prednisone and ABK    Patient complains of cough for the past several weeks. He has episodic events since then. He took a group of teenagers to the beach and got sick for a few days with URI symptoms the cough has been persistent. Former smoker, quit 20 years ago, smoked for 20 years, for about 1 ppd. He had pulmonary function tests in 2009. 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 He notices that his cough is worse when its cold. He has had allergies as a child. He had significant allergies as a kid. Never hospitalized as a child for respiratory symptoms. He does have a dry mouth. He does take trazadone and benadryl at bedtime. No eczema. He was dx with MS, dx in 2012. His gait has been the most difficult. He a swallow study 5 years ago. He has had episodes of cough/choking recently.    Past Medical History:  Diagnosis Date   Atypical chest pain    Barrett's esophagus    Bilateral cataracts    Chicken pox    Chronic constipation    Coronary artery disease    Foreign body (FB) in soft tissue    GERD (gastroesophageal reflux disease)    Hyperlipidemia    MS (multiple sclerosis) (HCC)    Shingles      No family history on file.   Past Surgical History:  Procedure Laterality Date   ADENOIDECTOMY     APPENDECTOMY     ARTHROPLASTY Right 01/02/2011   partial meniscectomy    CARDIAC CATHETERIZATION     COLONOSCOPY  02/05/2008   COLONOSCOPY WITH PROPOFOL N/A 08/23/2016   Procedure: COLONOSCOPY WITH PROPOFOL;  Surgeon: Lollie Sails, MD;  Location: [facility] ENDOSCOPY;  Service: Endoscopy;  Laterality: N/A;   ESOPHAGOGASTRODUODENOSCOPY (EGD) WITH PROPOFOL N/A 08/23/2016   Procedure: ESOPHAGOGASTRODUODENOSCOPY (EGD) WITH PROPOFOL;  Surgeon: Lollie Sails, MD;  Location: [facility] ENDOSCOPY;  Service: Endoscopy;  Laterality: N/A;   ESOPHAGOGASTRODUODENOSCOPY ENDOSCOPY  02/05/2008   right wrist surgery     SEPTOPLASTY     TONSILLECTOMY      Social History   Socioeconomic History   Marital status: Married    Spouse name: Not on file   Number of children: Not on file   Years of education: Not on file   Highest education level: Not on file  Occupational History   Not on file  Social Needs   Financial resource strain: Not on file   Food insecurity:    Worry: Not on file    Inability: Not on file   Transportation needs:    Medical: Not on file    Non-medical: Not on file  Tobacco Use   Smoking status: Former Smoker    Packs/day: 1.00    Years: 20.00    Pack years: 20.00    Types: Cigarettes    Last attempt to quit: 09/23/2006    Years since quitting: 11.7   Smokeless tobacco: Never Used  Substance and Sexual Activity   Alcohol use: No   Drug use: No   Sexual activity: Not on file  Lifestyle   Physical activity:    Days per week: Not on file    Minutes per  session: Not on file   Stress: Not on file  Relationships   Social connections:    Talks on phone: Not on file    Gets together: Not on file    Attends religious service: Not on file    Active member of club or organization: Not on file    Attends meetings of clubs or organizations: Not on file    Relationship status: Not on file   Intimate partner violence:    Fear of current or ex partner: Not on file    Emotionally abused: Not on file    Physically abused: Not on file    Forced sexual activity: Not on file  Other Topics Concern   Not on file  Social History Narrative   Not on file     No Known Allergies   Outpatient Medications Prior to Visit  Medication Sig Dispense Refill   acetaminophen (TYLENOL) 500 MG tablet Take 1,000 mg by mouth every 6 (six)  hours as needed.     albuterol (PROVENTIL HFA;VENTOLIN HFA) 108 (90 Base) MCG/ACT inhaler Inhale 2 puffs into the lungs every 4 (four) hours as needed for wheezing or shortness of breath.     Albuterol Sulfate 108 (90 Base) MCG/ACT AEPB Inhale into the lungs.     amoxicillin (AMOXIL) 875 MG tablet Take 875 mg by mouth 2 (two) times daily. Every 12 hours x 7 days     amphetamine-dextroamphetamine (ADDERALL XR) 25 MG 24 hr capsule Take 25 mg by mouth every morning.     aspirin EC 81 MG tablet Take 81 mg by mouth daily.     baclofen (LIORESAL) 20 MG tablet Take 20 mg by mouth 3 (three) times daily.     benzonatate (TESSALON) 200 MG capsule Take 200 mg by mouth 3 (three) times daily as needed for cough.     cholecalciferol (VITAMIN D) 1000 units tablet Take 1,000 Units by mouth daily. 2 capsules daily (2,000 units)     Fingolimod HCl (GILENYA) 0.5 MG CAPS Take by mouth every morning.     Fingolimod HCl (GILENYA) 0.5 MG CAPS TAKE 1 CAPSULE (0.5 MG TOTAL) DAILY     meloxicam (MOBIC) 15 MG tablet Take 1 tablet (15 mg total) by mouth daily. 90 tablet 1   methylPREDNISolone (MEDROL DOSEPAK) 4 MG TBPK tablet 6 day dose pack - take as directed 21 tablet 0   metoprolol tartrate (LOPRESSOR) 25 MG tablet Take 25 mg by mouth every morning.     omeprazole (PRILOSEC) 20 MG capsule Take 20 mg by mouth 2 (two) times daily before a meal.     predniSONE (STERAPRED UNI-PAK 21 TAB) 10 MG (21) TBPK tablet Take 10 mg by mouth daily. 6 po qd x 1 day, then 5 po qd x 1 day, then 4 po x 1 day, then 3 po x 1 day, then 2 po x 1 day, then 1 po x 1 day.     SHINGRIX injection inject 0.5 milliliter intramuscularly  0   simvastatin (ZOCOR) 40 MG tablet Take 40 mg by mouth daily.     traMADol (ULTRAM) 50 MG tablet take 1 tablet by mouth every 4 to 6 hours if needed for DENTAL PAIN  0   traZODone (DESYREL) 100 MG tablet Take 200 mg by mouth at bedtime.     FLOVENT HFA 110 MCG/ACT inhaler INL 1 PUFF ITL BID      No facility-administered medications prior to visit.     Review of Systems  Constitutional: Negative for chills, fever, malaise/fatigue and weight loss.  HENT: Negative for hearing loss, sore throat and tinnitus.   Eyes: Negative for blurred vision and double vision.  Respiratory: Positive for cough and wheezing. Negative for hemoptysis, sputum production, shortness of breath and stridor.   Cardiovascular: Negative for chest pain, palpitations, orthopnea, leg swelling and PND.  Gastrointestinal: Negative for abdominal pain, constipation, diarrhea, heartburn, nausea and vomiting.  Genitourinary: Negative for dysuria, hematuria and urgency.  Musculoskeletal: Negative for joint pain and myalgias.  Skin: Negative for itching and rash.  Neurological: Negative for dizziness, tingling, weakness and headaches.  Endo/Heme/Allergies: Negative for environmental allergies. Does not bruise/bleed easily.  Psychiatric/Behavioral: Negative for depression. The patient is not nervous/anxious and does not have insomnia.   All other systems reviewed and are negative.    Objective:  Physical Exam Vitals signs reviewed.  Constitutional:      General: He is not in acute distress.    Appearance: He is well-developed.  HENT:     Head: Normocephalic and atraumatic.  Eyes:     General: No scleral icterus.    Conjunctiva/sclera: Conjunctivae normal.     Pupils: Pupils are equal, round, and reactive to light.  Neck:     Musculoskeletal: Neck supple.     Vascular: No JVD.     Trachea: No tracheal deviation.  Cardiovascular:     Rate and Rhythm: Normal rate and regular rhythm.     Heart sounds: Normal heart sounds. No murmur.  Pulmonary:     Effort: Pulmonary effort is normal. No tachypnea, accessory muscle usage or respiratory distress.     Breath sounds: No stridor. Wheezing present. No rhonchi or rales.  Abdominal:     General: Bowel sounds are normal. There is no distension.     Palpations:  Abdomen is soft.     Tenderness: There is no abdominal tenderness.  Musculoskeletal:        General: No tenderness.  Lymphadenopathy:     Cervical: No cervical adenopathy.  Skin:    General: Skin is warm and dry.     Capillary Refill: Capillary refill takes less than 2 seconds.     Findings: No rash.  Neurological:     Mental Status: He is alert and oriented to person, place, and time.  Psychiatric:        Behavior: Behavior normal.      Vitals:   06/11/18 0912  BP: 122/80  Pulse: 67  SpO2: 99%  Weight: 191 lb (86.6 kg)  Height: 6\' 2"  (1.88 m)   99% on RA BMI Readings from Last 3 Encounters:  06/11/18 24.52 kg/m  08/23/16 26.04 kg/m   Wt Readings from Last 3 Encounters:  06/11/18 191 lb (86.6 kg)  08/23/16 192 lb (87.1 kg)     CBC    Component Value Date/Time   WBC 3.9 08/23/2016 0848   RBC 4.86 08/23/2016 0848   HGB 14.9 08/23/2016 0848   HCT 44.1 08/23/2016 0848   PLT 215 08/23/2016 0848   MCV 90.8 08/23/2016 0848   MCH 30.7 08/23/2016 0848   MCHC 33.8 08/23/2016 0848   RDW 14.2 08/23/2016 0848   LYMPHSABS 0.1 (L) 08/23/2016 0848   MONOABS 0.6 08/23/2016 0848   EOSABS 0.2 08/23/2016 0848   BASOSABS 0.0 08/23/2016 0848    Chest Imaging: No recent chest imaging   Pulmonary Functions Testing Results: No flowsheet data found.  FeNO: None   Pathology: None   Echocardiogram: None   Heart Catheterization:  None     Assessment & Plan:   Cough - Plan: Pulmonary Function Test  Wheezing - Plan: Pulmonary Function Test  History of URI (upper respiratory infection)  Post viral asthma  Post viral RAD (reactive airway disease)  Former smoker  MS (multiple sclerosis) ([place])  Discussion:  This is a 63 year old gentleman former smoker, history of allergies as a child.  Does have seasonal allergies as well.  Presents with cough since January.  This is all following an upper respiratory tract infection.  I do believe he has baseline reasoning for  development of a post viral asthma/post viral reactive airway disease type situation.  He is now ongoing persistent daily cough and chest tightness worse with cold weather.  On exam today has wheezing.  Patient also has multiple sclerosis and is on fingolimod for treatment.  Has had some lymphocyte suppression in the past.  We will start with the following: Continue use of albuterol for shortness of breath and wheezing. We will add Symbicort 160, 2 puffs twice daily with spacer.  After he is done this for approximately a month and if he is improving can de-escalate back to the Flovent inhaler that he was started on by primary care. We will also need to complete pulmonary function tests once he returns to the office and is back to his normal breathing baseline. If his cough is continued we need to start investigating other reasons.  He should continue the use of his PPI for his reflux and antihistamines for his allergies As for his multiple sclerosis he has had swallow studies in the past.  If his cough stays persistent may need to consider evaluation of swallow function again.  Greater than 50% of this patient 60-minute of visit was been face-to-face discussing above recommendations and treatment plan.    Current Outpatient Medications:    acetaminophen (TYLENOL) 500 MG tablet, Take 1,000 mg by mouth every 6 (six) hours as needed., Disp: , Rfl:    albuterol (PROVENTIL HFA;VENTOLIN HFA) 108 (90 Base) MCG/ACT inhaler, Inhale 2 puffs into the lungs every 4 (four) hours as needed for wheezing or shortness of breath., Disp: , Rfl:    Albuterol Sulfate 108 (90 Base) MCG/ACT AEPB, Inhale into the lungs., Disp: , Rfl:    amoxicillin (AMOXIL) 875 MG tablet, Take 875 mg by mouth 2 (two) times daily. Every 12 hours x 7 days, Disp: , Rfl:    amphetamine-dextroamphetamine (ADDERALL XR) 25 MG 24 hr capsule, Take 25 mg by mouth every morning., Disp: , Rfl:    aspirin EC 81 MG tablet, Take 81 mg by mouth  daily., Disp: , Rfl:    baclofen (LIORESAL) 20 MG tablet, Take 20 mg by mouth 3 (three) times daily., Disp: , Rfl:    benzonatate (TESSALON) 200 MG capsule, Take 200 mg by mouth 3 (three) times daily as needed for cough., Disp: , Rfl:    cholecalciferol (VITAMIN D) 1000 units tablet, Take 1,000 Units by mouth daily. 2 capsules daily (2,000 units), Disp: , Rfl:    Fingolimod HCl (GILENYA) 0.5 MG CAPS, Take by mouth every morning., Disp: , Rfl:    Fingolimod HCl (GILENYA) 0.5 MG CAPS, TAKE 1 CAPSULE (0.5 MG TOTAL) DAILY, Disp: , Rfl:    meloxicam (MOBIC) 15 MG tablet, Take 1 tablet (15 mg total) by mouth daily., Disp: 90 tablet, Rfl: 1   methylPREDNISolone (MEDROL DOSEPAK) 4 MG TBPK tablet, 6 day dose pack - take as directed, Disp: 21 tablet, Rfl: 0  metoprolol tartrate (LOPRESSOR) 25 MG tablet, Take 25 mg by mouth every morning., Disp: , Rfl:    omeprazole (PRILOSEC) 20 MG capsule, Take 20 mg by mouth 2 (two) times daily before a meal., Disp: , Rfl:    predniSONE (STERAPRED UNI-PAK 21 TAB) 10 MG (21) TBPK tablet, Take 10 mg by mouth daily. 6 po qd x 1 day, then 5 po qd x 1 day, then 4 po x 1 day, then 3 po x 1 day, then 2 po x 1 day, then 1 po x 1 day., Disp: , Rfl:    SHINGRIX injection, inject 0.5 milliliter intramuscularly, Disp: , Rfl: 0   simvastatin (ZOCOR) 40 MG tablet, Take 40 mg by mouth daily., Disp: , Rfl:    traMADol (ULTRAM) 50 MG tablet, take 1 tablet by mouth every 4 to 6 hours if needed for DENTAL PAIN, Disp: , Rfl: 0   traZODone (DESYREL) 100 MG tablet, Take 200 mg by mouth at bedtime., Disp: , Rfl:    FLOVENT HFA 110 MCG/ACT inhaler, INL 1 PUFF ITL BID, Disp: , Rfl:    Garner Nash, DO [facility] 06/11/2018 9:17 AM

## 2018-06-11 NOTE — Patient Instructions (Addendum)
Thank you for visiting Dr. Valeta Harms at [facility]. Today we recommend the following:  Orders Placed This Encounter  Procedures  . Pulmonary Function Test   Please use your symbicort 160, 2 puffs twice daily with spacer for the next month.  Then you can return to using the flovent Continue to use your albuterol for shortness of breath and wheezing.   Return in about 8 weeks (around 08/06/2018).

## 2018-08-06 ENCOUNTER — Ambulatory Visit: Payer: Medicare Other | Admitting: Pulmonary Disease

## 2018-09-16 ENCOUNTER — Encounter: Payer: Self-pay | Admitting: Pulmonary Disease

## 2018-09-29 ENCOUNTER — Ambulatory Visit: Payer: Medicare Other | Admitting: Pulmonary Disease

## 2018-10-08 ENCOUNTER — Ambulatory Visit: Payer: Self-pay | Admitting: Urology

## 2018-10-09 ENCOUNTER — Telehealth: Payer: Self-pay | Admitting: Pulmonary Disease

## 2018-10-09 NOTE — Telephone Encounter (Signed)
Patient called back to schedule PFT - pt scheduled on 11/06/2018 - pt mention that he has ran out of the samples of Symbicort - he would like to know if he needs to be on this medicine still and if so can he can additional samples -pr

## 2018-10-09 NOTE — Telephone Encounter (Signed)
Discussion:  This is a 63 year old gentleman former smoker, history of allergies as a child.  Does have seasonal allergies as well.  Presents with cough since January.  This is all following an upper respiratory tract infection.  I do believe he has baseline reasoning for development of a post viral asthma/post viral reactive airway disease type situation.  He is now ongoing persistent daily cough and chest tightness worse with cold weather.  On exam today has wheezing.  Patient also has multiple sclerosis and is on fingolimod for treatment.  Has had some lymphocyte suppression in the past.  We will start with the following: Continue use of albuterol for shortness of breath and wheezing. We will add Symbicort 160, 2 puffs twice daily with spacer.  After he is done this for approximately a month and if he is improving can de-escalate back to the Flovent inhaler that he was started on by primary care. We will also need to complete pulmonary function tests once he returns to the office and is back to his normal breathing baseline. If his cough is continued we need to start investigating other reasons.  He should continue the use of his PPI for his reflux and antihistamines for his allergies As for his multiple sclerosis he has had swallow studies in the past.  If his cough stays persistent may need to consider evaluation of swallow function again.  Called and spoke with pt letting him know info stated in last OV in regards to the symbicort. Pt verbalized understanding and stated he was going to go without using inhalers now that he was finished with the symbicort and not go back on flovent to see how things go for him. Stated to pt that after PFT in  August, when he has OV following PFT, Dr. Valeta Harms could reassess at that visit to see if any changes needed to be made with inhalers. Pt verbalized understanding.nothing further needed.

## 2018-10-23 ENCOUNTER — Other Ambulatory Visit: Payer: Self-pay | Admitting: Pulmonary Disease

## 2018-11-01 ENCOUNTER — Other Ambulatory Visit (HOSPITAL_COMMUNITY): Payer: Medicare Other

## 2018-11-06 ENCOUNTER — Ambulatory Visit: Payer: Medicare Other | Admitting: Pulmonary Disease

## 2018-11-13 ENCOUNTER — Ambulatory Visit: Payer: Self-pay | Admitting: Urology

## 2018-12-24 ENCOUNTER — Telehealth: Payer: Self-pay | Admitting: Pulmonary Disease

## 2018-12-24 MED ORDER — BUDESONIDE-FORMOTEROL FUMARATE 160-4.5 MCG/ACT IN AERO
2.0000 | INHALATION_SPRAY | Freq: Two times a day (BID) | RESPIRATORY_TRACT | 0 refills | Status: DC
Start: 2018-12-24 — End: 2018-12-30

## 2018-12-24 NOTE — Telephone Encounter (Signed)
Attempted to call patient, no answer, left a detailed message per DPR. I had also spoke to patient prior letting him know I would check with provider and send in Rx to preferred pharmacy. Nothing further needed at this time.

## 2018-12-24 NOTE — Telephone Encounter (Signed)
Per last OV note patient was to use samples of Symbicort 160 and then go back on his Flovent.  Called and spoke to patient to relay message from Dr. Valeta Harms. Patient reports he doesn't have any Flovent and doesn't remember when he last used it.   Dr. Valeta Harms would you like me to send in a refill of the Symbicort or Flovent?  Thank you!

## 2018-12-24 NOTE — Telephone Encounter (Signed)
PCCM:  That is fine. But I am not going to be able to tell him much more than his last office visit.   If he is breathing worse then he should follow the first recommendations I made when I saw him and continue his symbicort like he was instructed.   Thank I am glad to see him and reinforce the same plan if necessary.   Garner Nash, DO [facility] 12/24/2018 9:58 AM

## 2018-12-24 NOTE — Telephone Encounter (Signed)
PCCM:  Yes, ok to refill symbicort Thanks  [facility] 12/24/2018 2:08 PM

## 2018-12-24 NOTE — Telephone Encounter (Signed)
Called and spoke to patient to schedule pre-procedure covid testing prior to PFT scheduled for 12/30/2018.  Patient stated that he thinks it isn't a good time for a PFT since he hasn't been breathing well.  Patient reports that he has been having shortness of breath and increased wheezing since the weather became cooler.  Patient stated he has been using albuterol rescue inhaler 4 times a day, everyday.  Patient stated that he hasn't been using Symbicort.  Patient shared that he used the samples but the weather was then warm and he felt fine so he has not been taking any other preventative/maintance medication.  Patient stated that he went and had blood drawn for the covid antibodies and that he was negative.  Patient would like to keep OV for 10/6 and talk with Dr. Valeta Harms during that OV about when it would be good to have a PFT.  Dr. Valeta Harms, please advise. Thanks!

## 2018-12-30 ENCOUNTER — Ambulatory Visit (INDEPENDENT_AMBULATORY_CARE_PROVIDER_SITE_OTHER): Payer: Medicare Other | Admitting: Pulmonary Disease

## 2018-12-30 ENCOUNTER — Other Ambulatory Visit: Payer: Self-pay

## 2018-12-30 ENCOUNTER — Encounter: Payer: Self-pay | Admitting: Pulmonary Disease

## 2018-12-30 VITALS — BP 120/80 | HR 64 | Temp 98.0°F | Ht 75.0 in | Wt 193.4 lb

## 2018-12-30 DIAGNOSIS — R062 Wheezing: Secondary | ICD-10-CM | POA: Diagnosis not present

## 2018-12-30 DIAGNOSIS — R05 Cough: Secondary | ICD-10-CM | POA: Diagnosis not present

## 2018-12-30 DIAGNOSIS — Z87891 Personal history of nicotine dependence: Secondary | ICD-10-CM | POA: Diagnosis not present

## 2018-12-30 DIAGNOSIS — R059 Cough, unspecified: Secondary | ICD-10-CM

## 2018-12-30 MED ORDER — MONTELUKAST SODIUM 10 MG PO TABS: 10.0000 mg | ORAL_TABLET | Freq: Every day | ORAL | 3 refills | Status: DC

## 2018-12-30 MED ORDER — BUDESONIDE-FORMOTEROL FUMARATE 160-4.5 MCG/ACT IN AERO: 2.0000 | INHALATION_SPRAY | Freq: Two times a day (BID) | RESPIRATORY_TRACT | 3 refills | Status: DC

## 2018-12-30 MED ORDER — MONTELUKAST SODIUM 10 MG PO TABS: 10.0000 mg | ORAL_TABLET | Freq: Every day | ORAL | 11 refills | Status: DC

## 2018-12-30 MED ORDER — BUDESONIDE-FORMOTEROL FUMARATE 160-4.5 MCG/ACT IN AERO: 2.0000 | INHALATION_SPRAY | Freq: Two times a day (BID) | RESPIRATORY_TRACT | 0 refills | Status: DC

## 2018-12-30 MED ORDER — BUDESONIDE-FORMOTEROL FUMARATE 160-4.5 MCG/ACT IN AERO
2.0000 | INHALATION_SPRAY | Freq: Two times a day (BID) | RESPIRATORY_TRACT | 6 refills | Status: DC
Start: 2018-12-30 — End: 2019-01-20

## 2018-12-30 NOTE — Progress Notes (Signed)
Synopsis: Referred in March 2020 for cough by Derinda Late, MD  Subjective:   PATIENT ID: Allen Tapia GENDER: male DOB: 1955-06-04, MRN: [id number]  Chief Complaint  Patient presents with  . Follow-up    F/u Re: Cough. He is currenlty using symbicort. He reports using 2-3/day but has since decreased since starting back on the symbicort.     Patient complains of cough for the past several weeks. He has episodic events since then. He took a group of teenagers to the beach and got sick for a few days with URI symptoms the cough has been persistent. Former smoker, quit 20 years ago, smoked for 20 years, for about 1 ppd. He had pulmonary function tests in 2009. 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 He notices that his cough is worse when its cold. He has had allergies as a child. He had significant allergies as a kid. Never hospitalized as a child for respiratory symptoms. He does have a dry mouth. He does take trazadone and benadryl at bedtime. No eczema. He was dx with MS, dx in 2012. His gait has been the most difficult. He a swallow study 5 years ago. He has had episodes of cough/choking recently.   OV 12/30/2018: Doing well since last office visit.  He used Symbicort for a short period of time.  His symptoms abated.  He did well throughout the summer with no issues.  This fall noticed with the change in weather developed chest tightness with wheezing again.  Had an exacerbation a few weeks ago.  He was called to set up his PFT appointment at this time noticed that he was still having much difficulty breathing.  He did restart his Symbicort samples that he was given in the past.  After about a week of Symbicort his symptoms significantly improved no longer had dyspnea on exertion shortness of breath chest tightness or wheezing.  At this time he is doing much better understands that he needs to be on medication daily.  He has restarted it does need refills of this  medication.   Past Medical History:  Diagnosis Date  . Atypical chest pain   . Barrett's esophagus   . Bilateral cataracts   . Chicken pox   . Chronic constipation   . Coronary artery disease   . Foreign body (FB) in soft tissue   . GERD (gastroesophageal reflux disease)   . Hyperlipidemia   . MS (multiple sclerosis) ([place])   . Shingles      No family history on file.   Past Surgical History:  Procedure Laterality Date  . ADENOIDECTOMY    . APPENDECTOMY    . ARTHROPLASTY Right 01/02/2011   partial meniscectomy   . CARDIAC CATHETERIZATION    . COLONOSCOPY  02/05/2008  . COLONOSCOPY WITH PROPOFOL N/A 08/23/2016   Procedure: COLONOSCOPY WITH PROPOFOL;  Surgeon: Lollie Sails, MD;  Location: [facility] ENDOSCOPY;  Service: Endoscopy;  Laterality: N/A;  . ESOPHAGOGASTRODUODENOSCOPY (EGD) WITH PROPOFOL N/A 08/23/2016   Procedure: ESOPHAGOGASTRODUODENOSCOPY (EGD) WITH PROPOFOL;  Surgeon: Lollie Sails, MD;  Location: [facility] ENDOSCOPY;  Service: Endoscopy;  Laterality: N/A;  . ESOPHAGOGASTRODUODENOSCOPY ENDOSCOPY  02/05/2008  . right wrist surgery    . SEPTOPLASTY    . TONSILLECTOMY      Social History   Socioeconomic History  . Marital status: Married    Spouse name: Not on file  . Number of children: Not on file  . Years of education: Not on file  .  Highest education level: Not on file  Occupational History  . Not on file  Social Needs  . Financial resource strain: Not on file  . Food insecurity    Worry: Not on file    Inability: Not on file  . Transportation needs    Medical: Not on file    Non-medical: Not on file  Tobacco Use  . Smoking status: Former Smoker    Packs/day: 1.00    Years: 20.00    Pack years: 20.00    Types: Cigarettes    Quit date: 09/23/2006    Years since quitting: 12.2  . Smokeless tobacco: Never Used  Substance and Sexual Activity  . Alcohol use: No  . Drug use: No  . Sexual activity: Not on file  Lifestyle  . Physical activity     Days per week: Not on file    Minutes per session: Not on file  . Stress: Not on file  Relationships  . Social Herbalist on phone: Not on file    Gets together: Not on file    Attends religious service: Not on file    Active member of club or organization: Not on file    Attends meetings of clubs or organizations: Not on file    Relationship status: Not on file  . Intimate partner violence    Fear of current or ex partner: Not on file    Emotionally abused: Not on file    Physically abused: Not on file    Forced sexual activity: Not on file  Other Topics Concern  . Not on file  Social History Narrative  . Not on file     No Known Allergies   Outpatient Medications Prior to Visit  Medication Sig Dispense Refill  . acetaminophen (TYLENOL) 500 MG tablet Take 1,000 mg by mouth every 6 (six) hours as needed.    Marland Kitchen albuterol (PROVENTIL HFA;VENTOLIN HFA) 108 (90 Base) MCG/ACT inhaler Inhale 2 puffs into the lungs every 4 (four) hours as needed for wheezing or shortness of breath.    . Albuterol Sulfate 108 (90 Base) MCG/ACT AEPB Inhale into the lungs.    Marland Kitchen amoxicillin (AMOXIL) 875 MG tablet Take 875 mg by mouth 2 (two) times daily. Every 12 hours x 7 days    . amphetamine-dextroamphetamine (ADDERALL XR) 25 MG 24 hr capsule Take 25 mg by mouth every morning.    Marland Kitchen aspirin EC 81 MG tablet Take 81 mg by mouth daily.    . baclofen (LIORESAL) 20 MG tablet Take 20 mg by mouth 3 (three) times daily.    . benzonatate (TESSALON) 200 MG capsule Take 200 mg by mouth 3 (three) times daily as needed for cough.    . budesonide-formoterol (SYMBICORT) 160-4.5 MCG/ACT inhaler Inhale 2 puffs into the lungs 2 (two) times daily. 1 Inhaler 0  . cholecalciferol (VITAMIN D) 1000 units tablet Take 1,000 Units by mouth daily. 2 capsules daily (2,000 units)    . Fingolimod HCl (GILENYA) 0.5 MG CAPS Take by mouth every morning.    . Fingolimod HCl (GILENYA) 0.5 MG CAPS TAKE 1 CAPSULE (0.5 MG TOTAL) DAILY     . FLOVENT HFA 110 MCG/ACT inhaler INL 1 PUFF ITL BID    . meloxicam (MOBIC) 15 MG tablet Take 1 tablet (15 mg total) by mouth daily. 90 tablet 1  . methylPREDNISolone (MEDROL DOSEPAK) 4 MG TBPK tablet 6 day dose pack - take as directed 21 tablet 0  . metoprolol  tartrate (LOPRESSOR) 25 MG tablet Take 25 mg by mouth every morning.    Marland Kitchen omeprazole (PRILOSEC) 20 MG capsule Take 20 mg by mouth 2 (two) times daily before a meal.    . SHINGRIX injection inject 0.5 milliliter intramuscularly  0  . simvastatin (ZOCOR) 40 MG tablet Take 40 mg by mouth daily.    Marland Kitchen Spacer/Aero-Holding Chambers (AEROCHAMBER MV) inhaler Use as instructed 1 each 0  . traMADol (ULTRAM) 50 MG tablet take 1 tablet by mouth every 4 to 6 hours if needed for DENTAL PAIN  0  . traZODone (DESYREL) 100 MG tablet Take 200 mg by mouth at bedtime.    . predniSONE (STERAPRED UNI-PAK 21 TAB) 10 MG (21) TBPK tablet Take 10 mg by mouth daily. 6 po qd x 1 day, then 5 po qd x 1 day, then 4 po x 1 day, then 3 po x 1 day, then 2 po x 1 day, then 1 po x 1 day.     No facility-administered medications prior to visit.     Review of Systems  Constitutional: Negative for chills, fever, malaise/fatigue and weight loss.  HENT: Negative for hearing loss, sore throat and tinnitus.   Eyes: Negative for blurred vision and double vision.  Respiratory: Positive for cough and wheezing. Negative for hemoptysis, sputum production, shortness of breath and stridor.   Cardiovascular: Negative for chest pain, palpitations, orthopnea, leg swelling and PND.  Gastrointestinal: Negative for abdominal pain, constipation, diarrhea, heartburn, nausea and vomiting.  Genitourinary: Negative for dysuria, hematuria and urgency.  Musculoskeletal: Negative for joint pain and myalgias.  Skin: Negative for itching and rash.  Neurological: Negative for dizziness, tingling, weakness and headaches.  Endo/Heme/Allergies: Negative for environmental allergies. Does not  bruise/bleed easily.  Psychiatric/Behavioral: Negative for depression. The patient is not nervous/anxious and does not have insomnia.   All other systems reviewed and are negative.    Objective:  Physical Exam Vitals signs reviewed.  Constitutional:      General: He is not in acute distress.    Appearance: He is well-developed.  HENT:     Head: Normocephalic and atraumatic.  Eyes:     General: No scleral icterus.    Conjunctiva/sclera: Conjunctivae normal.     Pupils: Pupils are equal, round, and reactive to light.  Neck:     Musculoskeletal: Neck supple.     Vascular: No JVD.     Trachea: No tracheal deviation.  Cardiovascular:     Rate and Rhythm: Normal rate and regular rhythm.     Heart sounds: Normal heart sounds. No murmur.  Pulmonary:     Effort: Pulmonary effort is normal. No tachypnea, accessory muscle usage or respiratory distress.     Breath sounds: Normal breath sounds. No stridor. No wheezing, rhonchi or rales.  Abdominal:     General: Bowel sounds are normal. There is no distension.     Palpations: Abdomen is soft.     Tenderness: There is no abdominal tenderness.  Musculoskeletal:        General: No tenderness.  Lymphadenopathy:     Cervical: No cervical adenopathy.  Skin:    General: Skin is warm and dry.     Capillary Refill: Capillary refill takes less than 2 seconds.     Findings: No rash.  Neurological:     Mental Status: He is alert and oriented to person, place, and time.  Psychiatric:        Behavior: Behavior normal.      Vitals:  12/30/18 1413  BP: 120/80  Pulse: 64  Temp: 98 F (36.7 C)  TempSrc: Temporal  SpO2: 93%  Weight: 193 lb 6.4 oz (87.7 kg)  Height: 6\' 3"  (1.905 m)   93% on RA BMI Readings from Last 3 Encounters:  12/30/18 24.17 kg/m  06/11/18 24.52 kg/m  08/23/16 26.04 kg/m   Wt Readings from Last 3 Encounters:  12/30/18 193 lb 6.4 oz (87.7 kg)  06/11/18 191 lb (86.6 kg)  08/23/16 192 lb (87.1 kg)     CBC     Component Value Date/Time   WBC 3.9 08/23/2016 0848   RBC 4.86 08/23/2016 0848   HGB 14.9 08/23/2016 0848   HCT 44.1 08/23/2016 0848   PLT 215 08/23/2016 0848   MCV 90.8 08/23/2016 0848   MCH 30.7 08/23/2016 0848   MCHC 33.8 08/23/2016 0848   RDW 14.2 08/23/2016 0848   LYMPHSABS 0.1 (L) 08/23/2016 0848   MONOABS 0.6 08/23/2016 0848   EOSABS 0.2 08/23/2016 0848   BASOSABS 0.0 08/23/2016 0848    Chest Imaging: No recent chest imaging   Pulmonary Functions Testing Results: No flowsheet data found.  FeNO: None   Pathology: None   Echocardiogram: None   Heart Catheterization: None     Assessment & Plan:   Cough  Wheezing  Former smoker  Discussion:  This is a 63 year old gentleman former smoker history of allergies as a child, seasonal allergies, worse now in the fall.  He does have multiple sclerosis on fingolimod which has lymphocyte suppression in the past.  At this time I believe he has moderate persistent asthma symptoms.  He did significantly improve on Symbicort.  However he needs refills of this medication.  He is using it with a spacer.  He does have questions about albuterol use as well.  Plan: Continue Symbicort 2 puffs twice daily with spacer. Continue albuterol use as needed for shortness of breath and wheezing. We will start you on Singulair 10 mg daily at this time. Pulmonary function test to be completed in approximately 8 weeks with return to the office. Patient can be seen by myself or app for return to the office to review pulmonary function test.  Greater than 50% of this patient's 15-minute office visit was spent face-to-face discussing the recommendations and treatment plan.    Current Outpatient Medications:  .  acetaminophen (TYLENOL) 500 MG tablet, Take 1,000 mg by mouth every 6 (six) hours as needed., Disp: , Rfl:  .  albuterol (PROVENTIL HFA;VENTOLIN HFA) 108 (90 Base) MCG/ACT inhaler, Inhale 2 puffs into the lungs every 4 (four) hours  as needed for wheezing or shortness of breath., Disp: , Rfl:  .  Albuterol Sulfate 108 (90 Base) MCG/ACT AEPB, Inhale into the lungs., Disp: , Rfl:  .  amoxicillin (AMOXIL) 875 MG tablet, Take 875 mg by mouth 2 (two) times daily. Every 12 hours x 7 days, Disp: , Rfl:  .  amphetamine-dextroamphetamine (ADDERALL XR) 25 MG 24 hr capsule, Take 25 mg by mouth every morning., Disp: , Rfl:  .  aspirin EC 81 MG tablet, Take 81 mg by mouth daily., Disp: , Rfl:  .  baclofen (LIORESAL) 20 MG tablet, Take 20 mg by mouth 3 (three) times daily., Disp: , Rfl:  .  benzonatate (TESSALON) 200 MG capsule, Take 200 mg by mouth 3 (three) times daily as needed for cough., Disp: , Rfl:  .  budesonide-formoterol (SYMBICORT) 160-4.5 MCG/ACT inhaler, Inhale 2 puffs into the lungs 2 (two) times daily., Disp:  1 Inhaler, Rfl: 0 .  cholecalciferol (VITAMIN D) 1000 units tablet, Take 1,000 Units by mouth daily. 2 capsules daily (2,000 units), Disp: , Rfl:  .  Fingolimod HCl (GILENYA) 0.5 MG CAPS, Take by mouth every morning., Disp: , Rfl:  .  Fingolimod HCl (GILENYA) 0.5 MG CAPS, TAKE 1 CAPSULE (0.5 MG TOTAL) DAILY, Disp: , Rfl:  .  FLOVENT HFA 110 MCG/ACT inhaler, INL 1 PUFF ITL BID, Disp: , Rfl:  .  meloxicam (MOBIC) 15 MG tablet, Take 1 tablet (15 mg total) by mouth daily., Disp: 90 tablet, Rfl: 1 .  methylPREDNISolone (MEDROL DOSEPAK) 4 MG TBPK tablet, 6 day dose pack - take as directed, Disp: 21 tablet, Rfl: 0 .  metoprolol tartrate (LOPRESSOR) 25 MG tablet, Take 25 mg by mouth every morning., Disp: , Rfl:  .  omeprazole (PRILOSEC) 20 MG capsule, Take 20 mg by mouth 2 (two) times daily before a meal., Disp: , Rfl:  .  SHINGRIX injection, inject 0.5 milliliter intramuscularly, Disp: , Rfl: 0 .  simvastatin (ZOCOR) 40 MG tablet, Take 40 mg by mouth daily., Disp: , Rfl:  .  Spacer/Aero-Holding Chambers (AEROCHAMBER MV) inhaler, Use as instructed, Disp: 1 each, Rfl: 0 .  traMADol (ULTRAM) 50 MG tablet, take 1 tablet by mouth  every 4 to 6 hours if needed for DENTAL PAIN, Disp: , Rfl: 0 .  traZODone (DESYREL) 100 MG tablet, Take 200 mg by mouth at bedtime., Disp: , Rfl:    Garner Nash, DO [facility] 12/30/2018 2:26 PM

## 2018-12-30 NOTE — Patient Instructions (Addendum)
Thank you for visiting Dr. Valeta Harms at [facility]. Today we recommend the following:  Meds ordered this encounter  Medications  . budesonide-formoterol (SYMBICORT) 160-4.5 MCG/ACT inhaler    Sig: Inhale 2 puffs into the lungs 2 (two) times daily.    Dispense:  3 Inhaler    Refill:  3  . montelukast (SINGULAIR) 10 MG tablet    Sig: Take 1 tablet (10 mg total) by mouth at bedtime.    Dispense:  30 tablet    Refill:  11   Return in about 8 weeks (around 02/24/2019).    Please do your part to reduce the spread of COVID-19.

## 2018-12-31 ENCOUNTER — Ambulatory Visit: Payer: Self-pay | Admitting: Urology

## 2019-01-05 ENCOUNTER — Other Ambulatory Visit: Payer: Self-pay

## 2019-01-05 ENCOUNTER — Encounter: Payer: Self-pay | Admitting: Podiatry

## 2019-01-05 ENCOUNTER — Ambulatory Visit (INDEPENDENT_AMBULATORY_CARE_PROVIDER_SITE_OTHER): Payer: Medicare Other | Admitting: Podiatry

## 2019-01-05 ENCOUNTER — Ambulatory Visit: Payer: Self-pay

## 2019-01-05 DIAGNOSIS — Q828 Other specified congenital malformations of skin: Secondary | ICD-10-CM

## 2019-01-05 DIAGNOSIS — M722 Plantar fascial fibromatosis: Secondary | ICD-10-CM

## 2019-01-05 DIAGNOSIS — M7751 Other enthesopathy of right foot: Secondary | ICD-10-CM

## 2019-01-05 DIAGNOSIS — L603 Nail dystrophy: Secondary | ICD-10-CM

## 2019-01-05 NOTE — Progress Notes (Signed)
He presents today requesting an injection for his anterior ankle right.  States that his worked in the past and like to have another one.  He is also concerned about a callus at the tip of the right toe.  Objective: Vital signs are stable alert and oriented x3.  There is no erythema edema cellulitis drainage odor.  He has reactive hyper keratoma distal aspect second digit right foot with mild mallet toe deformity.  He also has pain on palpation anterior ankle right.  There appears to be fluctuance within the ankle capsule.  Assessment: Capsulitis of the ankle joint right.  Mallet toe fifth right.  Plan: Discussed etiology pathology and surgical therapies debrided reactive hyperkeratotic lesion.  After that I went ahead and injected his anterior ankle with 20 mg Kenalog 5 mg Marcaine after Betadine skin prep.  He tolerated procedure well without complications.

## 2019-01-07 ENCOUNTER — Ambulatory Visit: Payer: Self-pay | Admitting: Urology

## 2019-01-08 ENCOUNTER — Other Ambulatory Visit: Payer: Self-pay

## 2019-01-08 ENCOUNTER — Encounter: Payer: Self-pay | Admitting: Urology

## 2019-01-08 ENCOUNTER — Ambulatory Visit (INDEPENDENT_AMBULATORY_CARE_PROVIDER_SITE_OTHER): Payer: Medicare Other | Admitting: Urology

## 2019-01-08 VITALS — BP 120/70 | HR 76 | Ht 74.0 in | Wt 189.0 lb

## 2019-01-08 DIAGNOSIS — N3281 Overactive bladder: Secondary | ICD-10-CM | POA: Diagnosis not present

## 2019-01-08 DIAGNOSIS — R350 Frequency of micturition: Secondary | ICD-10-CM

## 2019-01-08 LAB — URINALYSIS, COMPLETE
Bilirubin, UA: NEGATIVE
Glucose, UA: NEGATIVE
Ketones, UA: NEGATIVE
Nitrite, UA: NEGATIVE
Protein,UA: NEGATIVE
RBC, UA: NEGATIVE
Specific Gravity, UA: 1.02 (ref 1.005–1.030)
Urobilinogen, Ur: 0.2 mg/dL (ref 0.2–1.0)
pH, UA: 7 (ref 5.0–7.5)

## 2019-01-08 LAB — MICROSCOPIC EXAMINATION
Bacteria, UA: NONE SEEN
RBC: NONE SEEN /hpf (ref 0–2)

## 2019-01-08 LAB — BLADDER SCAN AMB NON-IMAGING: Scan Result: 206

## 2019-01-08 NOTE — Patient Instructions (Addendum)

## 2019-01-08 NOTE — Progress Notes (Signed)
01/08/19 3:06 PM   Allen Tapia 1955-12-31 [id number]  Referring provider: Derinda Late, MD [phone] [facility] [place],  [place] [phone]  CC: Urinary frequency  HPI: I saw Allen Tapia in urology clinic in consultation for urinary frequency from Dr. Marion Downer off.  He is a 63 year old male with multiple sclerosis who complains of chronic moderate to severe urinary frequency.  This can be as bad as 20-30 times during the day.  He is minimally bothered overnight.  He is never had urinary tract infections or needed a catheter before.  He consumes diet cherry Coke and high volumes during the day.  His primary complaint is urinary frequency and some occasional urgency, but he denies any urge incontinence.  He does occasionally have some trouble starting his stream and some weak stream.  He also has trouble sometimes in a public bathroom figuring out if he has to have a bowel movement or urinate.  He tried Flomax previously about 10 years ago that did not improve his symptoms at all.  There are no aggravating factors.  IPSS score in clinic today as 21, with quality of life mostly dissatisfied.  PVR is 117ml.  Urinalysis is benign today with 0-5 WBCs, 0 RBCs, no bacteria, nitrite negative.  PSA is well within the normal range at 0.2. PMH: Past Medical History:  Diagnosis Date  . Atypical chest pain   . Barrett's esophagus   . Bilateral cataracts   . Chicken pox   . Chronic constipation   . Coronary artery disease   . Foreign body (FB) in soft tissue   . GERD (gastroesophageal reflux disease)   . Hyperlipidemia   . MS (multiple sclerosis) ([place])   . Shingles     Surgical History: Past Surgical History:  Procedure Laterality Date  . ADENOIDECTOMY    . APPENDECTOMY    . ARTHROPLASTY Right 01/02/2011   partial meniscectomy   . CARDIAC CATHETERIZATION    . COLONOSCOPY  02/05/2008  . COLONOSCOPY WITH PROPOFOL N/A 08/23/2016   Procedure:  COLONOSCOPY WITH PROPOFOL;  Surgeon: Lollie Sails, MD;  Location: [facility] ENDOSCOPY;  Service: Endoscopy;  Laterality: N/A;  . ESOPHAGOGASTRODUODENOSCOPY (EGD) WITH PROPOFOL N/A 08/23/2016   Procedure: ESOPHAGOGASTRODUODENOSCOPY (EGD) WITH PROPOFOL;  Surgeon: Lollie Sails, MD;  Location: [facility] ENDOSCOPY;  Service: Endoscopy;  Laterality: N/A;  . ESOPHAGOGASTRODUODENOSCOPY ENDOSCOPY  02/05/2008  . right wrist surgery    . SEPTOPLASTY    . TONSILLECTOMY     Allergies: No Known Allergies  Family History: No family history on file.  Social History:  reports that he quit smoking about 12 years ago. His smoking use included cigarettes. He has a 20.00 pack-year smoking history. He has never used smokeless tobacco. He reports that he does not drink alcohol or use drugs.  ROS: Please see flowsheet from today's date for complete review of systems.  Physical Exam: BP 120/70   Pulse 76   Ht 6\' 2"  (1.88 m)   Wt 189 lb (85.7 kg)   BMI 24.27 kg/m    Constitutional:  Alert and oriented, No acute distress. Cardiovascular: No clubbing, cyanosis, or edema. Respiratory: Normal respiratory effort, no increased work of breathing. GI: Abdomen is soft, nontender, nondistended, no abdominal masses Lymph: No cervical or inguinal lymphadenopathy. Skin: No rashes, bruises or suspicious lesions. Neurologic: Grossly intact, no focal deficits, moving all 4 extremities. Psychiatric: Normal mood and affect.  Assessment & Plan:   In summary, the  patient is a 63 year old male with MS and primarily urinary frequency and symptoms most consistent with overactive bladder, as opposed to obstruction.  We had a long conversation about the relationship between overactive bladder and MS, as well as the role of diet sodas and overactive bladder.  I recommended minimizing the diet sodas in his diet, and seeing our pelvic floor physical therapist.  We also discussed a possible trial of Myrbetriq 50 mg daily if his  symptoms do not improve with these measures.  If he is not making progress despite these changes, could consider further investigation with either cystoscopy or urodynamics.  Minimize diet sodas Referral placed to pelvic floor physical therapy Myrbetriq 50 mg daily samples given to try if no improvement with above RTC 8 weeks for PVR and symptom check  Billey Co, MD  [facility] [address], [place], [place] [address] [phone]

## 2019-01-20 ENCOUNTER — Telehealth: Payer: Self-pay | Admitting: Pulmonary Disease

## 2019-01-20 MED ORDER — BUDESONIDE-FORMOTEROL FUMARATE 160-4.5 MCG/ACT IN AERO
2.0000 | INHALATION_SPRAY | Freq: Two times a day (BID) | RESPIRATORY_TRACT | 3 refills | Status: DC
Start: 2019-01-20 — End: 2019-03-26

## 2019-01-20 NOTE — Telephone Encounter (Signed)
Spoke with pt, requesting a 90 day rx for Symbicort to be sent to [facility].  This has been sent as requested.  Nothing further needed at this time- will close encounter.

## 2019-02-04 ENCOUNTER — Ambulatory Visit: Payer: Medicare Other | Attending: Urology | Admitting: Physical Therapy

## 2019-02-04 ENCOUNTER — Encounter: Payer: Self-pay | Admitting: Physical Therapy

## 2019-02-04 ENCOUNTER — Other Ambulatory Visit: Payer: Self-pay

## 2019-02-04 DIAGNOSIS — R296 Repeated falls: Secondary | ICD-10-CM | POA: Diagnosis present

## 2019-02-04 DIAGNOSIS — M6281 Muscle weakness (generalized): Secondary | ICD-10-CM | POA: Insufficient documentation

## 2019-02-04 DIAGNOSIS — R2689 Other abnormalities of gait and mobility: Secondary | ICD-10-CM

## 2019-02-05 NOTE — Patient Instructions (Signed)
Wear shoe lift in R  Remove if it gets uncomfortable or makes you off balance

## 2019-02-05 NOTE — Therapy (Addendum)
[place] MAIN [facility] SERVICES [address] [place], [place], [address] Phone: [phone]   Fax:  [phone]  Physical Therapy Evaluation  Patient Details  Name: MUCAAD SCHIERMAN MRN: [id number] Date of Birth: [birth year] Referring Provider (PT): Sninisky    Encounter Date: 02/04/2019  PT End of Session - 02/04/19 1016    Visit Number  1    Number of Visits  10    Date for PT Re-Evaluation  04/15/19    PT Start Time  1010    PT Stop Time  1110    PT Time Calculation (min)  60 min    Activity Tolerance  Patient tolerated treatment well    Behavior During Therapy  [facility] for tasks assessed/performed       Past Medical History:  Diagnosis Date  . Atypical chest pain   . Barrett's esophagus   . Bilateral cataracts   . Chicken pox   . Chronic constipation   . Coronary artery disease   . Foreign body (FB) in soft tissue   . GERD (gastroesophageal reflux disease)   . Hyperlipidemia   . MS (multiple sclerosis) ([place])   . Shingles     Past Surgical History:  Procedure Laterality Date  . ADENOIDECTOMY    . APPENDECTOMY    . ARTHROPLASTY Right 01/02/2011   partial meniscectomy   . CARDIAC CATHETERIZATION    . COLONOSCOPY  02/05/2008  . COLONOSCOPY WITH PROPOFOL N/A 08/23/2016   Procedure: COLONOSCOPY WITH PROPOFOL;  Surgeon: Lollie Sails, MD;  Location: [facility] ENDOSCOPY;  Service: Endoscopy;  Laterality: N/A;  . ESOPHAGOGASTRODUODENOSCOPY (EGD) WITH PROPOFOL N/A 08/23/2016   Procedure: ESOPHAGOGASTRODUODENOSCOPY (EGD) WITH PROPOFOL;  Surgeon: Lollie Sails, MD;  Location: [facility] ENDOSCOPY;  Service: Endoscopy;  Laterality: N/A;  . ESOPHAGOGASTRODUODENOSCOPY ENDOSCOPY  02/05/2008  . right wrist surgery    . SEPTOPLASTY    . TONSILLECTOMY      There were no vitals filed for this visit.   Subjective Assessment - 02/04/19 1014    Subjective 1) urinary frequency:  Bladder issues started 5 years ago with frequent voiding with 3 x  per every 2 hours. It has been better since he stopped drinking sodas. Daily fluid intake 5-6 ( 16 fl oz) water, no sodas, no teas, no alcohol. 2) Difficulty with eliminating urine . It takes 3 x before voiding. standing is the preferred position. If he waits till very full to the point of pain, then he can not completely empty. Many times it takes a reasonable amount 2 out of 3 x.  The stream is slowly.  3) irregular bowel movements since childhood. 5-6 x week.    Pertinent History  Pt was Dx in 2012 with MS, relapsing and remitting. No flare ups. 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 Pt was looking into R TKA surgery but continues to get steroid shots and more recently received a gel shot which has relieved 90% of knee pain. Pt has to rely on holding his hand rail with stair climbing. Pt noticed he has L foot drop for 3-4 years and the podiatrist gave him injection 1.5 years in between shots. Pt has had R knee issues for at least 10 years with pain in the below the patella, with walking and stressful activities: bending and twist the knee. Has not had PT for his L knee.  Pt has had vision issues and due for cataract surgery in 1 weeek.  Total vision is not as good.  Late June 2020, pt got shingles and tingling on R side of his face.    Patient Stated Goals  Learn to help with bladder issues and avoid self-cath         [facility] PT Assessment - 02/05/19 1139      Assessment   Medical Diagnosis  overactive bladders    Referring Provider (PT)  Sninisky       Precautions   Precautions  None      Restrictions   Weight Bearing Restrictions  No      Balance Screen   Has the patient fallen in the past 6 months  Yes   15 falls across 2 years 2017-8, broke elbow in fall in 2015.   Has the patient had a decrease in activity level because of a fear of falling?   No    Is the patient reluctant to leave their home because of a fear of falling?   No      Strength    Overall Strength Comments  R hip flex 3/5, L 4/5, Knee flex/ext R 4-/5, L 4+/5 , hip abd 3/5 B       Palpation   Spinal mobility  L rotation ~20%, R 45%  ,sidebend WFL     SI assessment   R convex lumbar curve, R iliac crest lower, R shoulder lower   .   Pelvic floor contraction with overuse of abdominal mm - dyscoordination     Ambulation/Gait   Gait Pattern  --   decreased R stance, R genu varus, pelvic on R, trunklean    Gait velocity  pre Tx: 1.17 m/s, ( post Tx: 1.5 m/s with less trunk lean , more equal stance phase B) )                 Objective measurements completed on examination: See above findings.    Pelvic Floor Special Questions - 02/04/19 1105    Diastasis Recti  3 fingers below umbilicus        [facility] Adult PT Treatment/Exercise - 02/05/19 1139      Therapeutic Activites    Therapeutic Activities  Other Therapeutic Activities    Other Therapeutic Activities  discussed anatomy/ physiology, role of gait/ pelvic alignment, MS on his urinary Sx and repeated falls, Importance of continuation of care for MS management                   PT Long Term Goals - 02/05/19 1138      PT LONG TERM GOAL #1   Title  Pt will demo levelled pelvis girdle with use of shoe lift in R shoe across 2 visits in order to restore alignment of pelvic floor and progress to deep core training    Time  2    Period  Weeks    Status  New    Target Date  02/19/19      PT LONG TERM GOAL #2   Title  Pt will demo increased gait speed from 1.17 m/s to > 1.3 m/s in order to ambulate safely in community and to get to bathroom before leakage    Time  8    Period  Weeks    Status  New    Target Date  04/02/19      PT LONG TERM GOAL #3   Title  Pt will demo increased anterior pelvic floor mobility to eliminate urine    Time  4    Status  New    Target Date  03/05/19      PT LONG TERM GOAL #4   Title  Pt will demo increased leg strength from 3/5 to > 4/5 ( B hip abd, R knee/  ext) in order to walk with less deviations and minimize falls    Time  10    Period  Weeks    Status  New    Target Date  04/16/19      PT LONG TERM GOAL #5   Title  Pt will demo decreased 5 x Sit to stand from 2:29 to < 2:00 without BUE support in order to minimize falls    Time  10    Period  Weeks    Status  New    Target Date  04/16/19      Additional Long Term Goals   Additional Long Term Goals  --      PT LONG TERM GOAL #6   Title  Pt will decrease NIH-CPSI score from 49% to < 25 % in order to restore pelvic floor function    Time  10    Period  Weeks    Status  New    Target Date  04/16/19      PT LONG TERM GOAL #7   Title  Pt will report less difficulty with complete elimination of urine from 3 x to < 2 x in order to improve hygiene and function    Time  5    Period  Weeks    Status  New    Target Date  03/12/19             Plan - 02/05/19 1145    Clinical Impression Statement  Pt is a  63 yo male who reports of urinary frequency, difficulty with eliminating urine,  irregular bowel movements, and frequent falls. These Sx impact his QOL. Pt's Hx of MS is a contributing factor. Below are musculoskeletal findings that lead to an inefficient intraabdominal pressure system which also impact his urinary and bowel functions: _ gait deviations with decreased R stance phase, decreased R stance, R genu varus, pelvic on R, trunklean _ R lumbar scoliotic curve, lowered R shoulder/ iliac crest, limited trunk L rotation  _dyscoordination of pelvic floor with abdominal overuse _diastasis recti of 3 fingers width below umbilicus  Provided a shoe lift to R foot and pt's gait speed increased from 1.17 m/s to 1.5 m/s without c/o pain.  Prioritizing addressing pt's signifcantly spinal and lower kinetic chain deviations first to minimize risk for falls. Plan to refer to neuro PT after completing pelvic PT as pt will benefit from a continuum of care for minimize fall risks.   Next  session, reassess diastasis recti, pelvic floor mm, and progress to deep core coordination and strengthening.  Pt benefit from customized skilled Pelvic PT.      Personal Factors and Comorbidities  Comorbidity 3+;Comorbidity 2    Comorbidities  GERD, MS, multiple falls    Examination-Activity Limitations  Continence;Locomotion Level    Stability/Clinical Decision Making  Unstable/Unpredictable    Clinical Decision Making  High    Rehab Potential  Good    PT Frequency  1x / week    PT Duration  --   10   PT Treatment/Interventions  Functional mobility training;Moist Heat;Therapeutic activities;Patient/family education;Manual lymph drainage;Therapeutic exercise;Gait training;Iontophoresis 4mg /ml Dexamethasone;Electrical Stimulation;ADLs/Self Care Home Management;Neuromuscular re-education;Balance training;Manual techniques;Scar mobilization;Energy conservation;Splinting;Taping;Spinal Manipulations;Joint Manipulations    Consulted  and Agree with Plan of Care  Patient       Patient will benefit from skilled therapeutic intervention in order to improve the following deficits and impairments:  Increased muscle spasms, Postural dysfunction, Improper body mechanics, Abnormal gait, Decreased balance, Impaired flexibility, Decreased endurance, Decreased coordination, Decreased mobility, Decreased scar mobility, Hypomobility, Decreased strength, Decreased range of motion, Decreased activity tolerance, Decreased safety awareness, Difficulty walking  Visit Diagnosis: Other abnormalities of gait and mobility  Muscle weakness (generalized)  Repeated falls     Problem List Patient Active Problem List   Diagnosis Date Noted  . Barrett's esophagus 10/10/2016  . Chronic constipation 10/10/2016  . 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 Encounter for long-term (current) use of other medications  06/08/2013  . Dyspnea, unspecified 04/07/2012  . Multiple sclerosis ([place]) 01/31/2012  . Acid reflux 12/20/2010  . CAD (coronary artery disease) 12/20/2010  . Depression 12/20/2010  . FH: ischemic heart disease 12/20/2010  . Foreign body 12/20/2010  . Hyperlipemia 12/20/2010  . Tobacco abuse 12/20/2010    Jerl Mina ,PT, DPT, E-RYT  02/05/2019, 11:57 AM  [place] MAIN [facility] SERVICES [address] [place], [place], [address] Phone: [phone]   Fax:  [phone]  Name: KAMRIN STANBERY MRN: [id number] Date of Birth: 07-Jan-1956

## 2019-02-11 DIAGNOSIS — Z9842 Cataract extraction status, left eye: Secondary | ICD-10-CM | POA: Insufficient documentation

## 2019-02-13 NOTE — Addendum Note (Signed)
Addended by: Jerl Mina on: 02/13/2019 10:36 AM   Modules accepted: Orders

## 2019-02-23 ENCOUNTER — Ambulatory Visit: Payer: Medicare Other | Admitting: Physical Therapy

## 2019-02-23 ENCOUNTER — Other Ambulatory Visit: Payer: Self-pay

## 2019-02-23 DIAGNOSIS — R296 Repeated falls: Secondary | ICD-10-CM

## 2019-02-23 DIAGNOSIS — R2689 Other abnormalities of gait and mobility: Secondary | ICD-10-CM

## 2019-02-23 DIAGNOSIS — M6281 Muscle weakness (generalized): Secondary | ICD-10-CM

## 2019-02-23 NOTE — Therapy (Signed)
[place] MAIN [facility] SERVICES [address] [place], [place], [address] Phone: [phone]   Fax:  [phone]  Physical Therapy Treatment  Patient Details  Name: Allen Tapia MRN: [id number] Date of Birth: 1955-05-03 Referring Provider (PT): Sninisky    Encounter Date: 02/23/2019  PT End of Session - 02/23/19 1316    Visit Number  2    Number of Visits  10    Date for PT Re-Evaluation  04/15/19    PT Start Time  1000    PT Stop Time  1100    PT Time Calculation (min)  60 min    Activity Tolerance  Patient tolerated treatment well    Behavior During Therapy  [facility] for tasks assessed/performed       Past Medical History:  Diagnosis Date  . Atypical chest pain   . Barrett's esophagus   . Bilateral cataracts   . Chicken pox   . Chronic constipation   . Coronary artery disease   . Foreign body (FB) in soft tissue   . GERD (gastroesophageal reflux disease)   . Hyperlipidemia   . MS (multiple sclerosis) ([place])   . Shingles     Past Surgical History:  Procedure Laterality Date  . ADENOIDECTOMY    . APPENDECTOMY    . ARTHROPLASTY Right 01/02/2011   partial meniscectomy   . CARDIAC CATHETERIZATION    . COLONOSCOPY  02/05/2008  . COLONOSCOPY WITH PROPOFOL N/A 08/23/2016   Procedure: COLONOSCOPY WITH PROPOFOL;  Surgeon: Lollie Sails, MD;  Location: [facility] ENDOSCOPY;  Service: Endoscopy;  Laterality: N/A;  . ESOPHAGOGASTRODUODENOSCOPY (EGD) WITH PROPOFOL N/A 08/23/2016   Procedure: ESOPHAGOGASTRODUODENOSCOPY (EGD) WITH PROPOFOL;  Surgeon: Lollie Sails, MD;  Location: [facility] ENDOSCOPY;  Service: Endoscopy;  Laterality: N/A;  . ESOPHAGOGASTRODUODENOSCOPY ENDOSCOPY  02/05/2008  . right wrist surgery    . SEPTOPLASTY    . TONSILLECTOMY      There were no vitals filed for this visit.  Subjective Assessment - 02/23/19 1005    Subjective  On occasion the breathing practice helped with the urine to come out sooner. It did help  everytime. Pt cut out soda altogether.  Pt plans to number his bottles to make sure he is drinking water.  The shoe lift helped with walking. Pt still has a fair amount of knee pain.    Pertinent History  Pt was Dx in 2012 with MS, relapsing and remitting. No flare ups. 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 Pt was looking into R TKA surgery but continues to get steroid shots and more recently received a gel shot which has relieved 90% of knee pain. Pt has to rely on holding his hand rail with stair climbing. Pt noticed he has L foot drop for 3-4 years and the podiatrist gave him injection 1.5 years in between shots. Pt has had R knee issues for at least 10 years with pain in the below the patella, with walking and stressful activities: bending and twist the knee. Has not had PT for his L knee.  Pt has had vision issues and due for cataract surgery in 1 weeek. Total vision is not as good.  Late June 2020, pt got shingles and tingling on R side of his face.    Patient Stated Goals  Learn to help with bladder issues and avoid self-cath         [facility] PT Assessment - 02/23/19 1008  Strength   Overall Strength Comments  R hip flex, knee flex 3/5, knee ext 3+/5, L hip /knee flexion/ flex 4-/5        Palpation   Spinal mobility  L rotation ~45%, R 45%  ,sidebend WFL     SI assessment   R convex curve, Iliac crest levelled in stance     Palpation comment  increased QL/ paraspinal/ interspinal R > L.       Ambulation/Gait   Gait Pattern  --   less R trunk lean, more stance phase on R    Gait velocity  1.33 m/s with shoe lift in R                 Pelvic Floor Special Questions - 02/23/19 0001    Diastasis Recti  no fingers width separation.     External Perineal Exam  through clothing: no tightness, proper lengthening and quick squeeze without compensatory mechanicms        [facility] Adult PT Treatment/Exercise - 02/23/19 1008      Exercises    Other Exercises   see pt instructions to increase spinal mobility/ correct asymmetrical tightness 2/2 scoliosis       Modalities   Modalities  Moist Heat      Moist Heat Therapy   Number Minutes Moist Heat  5 Minutes   during exercise training   Moist Heat Location  --   lumbar      Manual Therapy   Manual therapy comments  STM/ MWM QL/ paraspinal/ interspinal R > L, midthoracic                    PT Long Term Goals - 02/05/19 1138      PT LONG TERM GOAL #1   Title  Pt will demo levelled pelvis girdle with use of shoe lift in R shoe across 2 visits in order to restore alignment of pelvic floor and progress to deep core training    Time  2    Period  Weeks    Status  New    Target Date  02/19/19      PT LONG TERM GOAL #2   Title  Pt will demo increased gait speed from 1.17 m/s to > 1.3 m/s in order to ambulate safely in community and to get to bathroom before leakage    Time  8    Period  Weeks    Status  New    Target Date  04/02/19      PT LONG TERM GOAL #3   Title  Pt will demo increased anterior pelvic floor mobility to eliminate urine    Time  4    Status  New    Target Date  03/05/19      PT LONG TERM GOAL #4   Title  Pt will demo increased leg strength from 3/5 to > 4/5 ( B hip abd, R knee/ ext) in order to walk with less deviations and minimize falls    Time  10    Period  Weeks    Status  New    Target Date  04/16/19      PT LONG TERM GOAL #5   Title  Pt will demo decreased 5 x Sit to stand from 2:29 to < 2:00 without BUE support in order to minimize falls    Time  10    Period  Weeks    Status  New    Target  Date  04/16/19      Additional Long Term Goals   Additional Long Term Goals  --      PT LONG TERM GOAL #6   Title  Pt will decrease NIH-CPSI score from 49 % to <25 % in order to restore pelvic floor function    Time  10    Period  Weeks    Status  New    Target Date  04/16/19      PT LONG TERM GOAL #7   Title  Pt will report less  difficulty with complete elimination of urine from 3 x to < 2 x in order to improve hygiene and function    Time  5    Period  Weeks    Status  New    Target Date  03/12/19            Plan - 02/23/19 1326    Clinical Impression Statement  Pt demo'd increased spinal mobility, levelled pelvic girdle with shoe lift placed R shoe and reports his walking is better but knee pain still remains. Breathing practices has occasionally helped with urination. Diastasis recti resolved which indicates intraabdominal pressure system will continue to improve.  Today's focus helped to decrease R lumbar mm tightness 2/2 R convex curve. Manual Tx was tolerated without increased pain and HEP was provided to increase mm pliability for HEP and after functional activities. Still withholding kegel exercises even though no pelvic floor tightness was present. Anticipate decreasing global back mm tightness will yield better outcome and pt will be ready for deep core strengthening at next session.  Pt continues to benefit from skilled PT.      Personal Factors and Comorbidities  Comorbidity 3+;Comorbidity 2    Comorbidities  GERD, MS, multiple falls    Examination-Activity Limitations  Continence;Locomotion Level    Stability/Clinical Decision Making  Unstable/Unpredictable    Rehab Potential  Good    PT Frequency  1x / week    PT Duration  --   10   PT Treatment/Interventions  Functional mobility training;Moist Heat;Therapeutic activities;Patient/family education;Manual lymph drainage;Therapeutic exercise;Gait training;Iontophoresis 4mg /ml Dexamethasone;Electrical Stimulation;ADLs/Self Care Home Management;Neuromuscular re-education;Balance training;Manual techniques;Scar mobilization;Energy conservation;Splinting;Taping;Spinal Manipulations;Joint Manipulations    Consulted and Agree with Plan of Care  Patient       Patient will benefit from skilled therapeutic intervention in order to improve the following  deficits and impairments:  Increased muscle spasms, Postural dysfunction, Improper body mechanics, Abnormal gait, Decreased balance, Impaired flexibility, Decreased endurance, Decreased coordination, Decreased mobility, Decreased scar mobility, Hypomobility, Decreased strength, Decreased range of motion, Decreased activity tolerance, Decreased safety awareness, Difficulty walking  Visit Diagnosis: Other abnormalities of gait and mobility  Repeated falls  Muscle weakness (generalized)     Problem List Patient Active Problem List   Diagnosis Date Noted  . Barrett's esophagus 10/10/2016  . Chronic constipation 10/10/2016  . 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 Encounter for long-term (current) use of other medications 06/08/2013  . Dyspnea, unspecified 04/07/2012  . Multiple sclerosis ([place]) 01/31/2012  . Acid reflux 12/20/2010  . CAD (coronary artery disease) 12/20/2010  . Depression 12/20/2010  . FH: ischemic heart disease 12/20/2010  . Foreign body 12/20/2010  . Hyperlipemia 12/20/2010  . Tobacco abuse 12/20/2010    Jerl Mina ,PT, DPT, E-RYT  02/23/2019, 1:29 PM  [place] MAIN [facility] SERVICES [place], [place],  [place] Phone: [phone]   Fax:  [phone]  Name: NICKSON DIMARE MRN: [id number] Date of Birth: 1955-12-28

## 2019-02-23 NOTE — Patient Instructions (Addendum)
Open book ( both sides ) 15 reps each side     Scoliosis specific stretch for R low back  -  Side of hip stretch:  Reclined twist for hips and side of the hips/ legs  Lay on your back, knees bend Scoot hips to the R , leave shoulders in place  A) rock knees side to side   B)  Drop knees to the L side resting onto pillows to keep leg at the same width of hips Pillow under L thigh to minimize too much strain  Rest for 5 mins    ____  Perform these stretches mid day after activities as well  _ B side stretch by wall  stand perpendicular to wall and reach up and down on forearm pressing against wall ( tip the tea pot)   _ hands on countertop,    Mini squat, lengthening back and     To R and L    _ coming up, hands on hips, and mini squat, chest lifts, look up,

## 2019-02-25 ENCOUNTER — Ambulatory Visit: Payer: Medicare Other | Admitting: Pulmonary Disease

## 2019-03-02 ENCOUNTER — Ambulatory Visit: Payer: Medicare Other | Admitting: Physical Therapy

## 2019-03-05 ENCOUNTER — Ambulatory Visit: Payer: Medicare Other | Admitting: Urology

## 2019-03-09 ENCOUNTER — Other Ambulatory Visit: Payer: Self-pay

## 2019-03-09 ENCOUNTER — Ambulatory Visit: Payer: Medicare Other | Attending: Urology | Admitting: Physical Therapy

## 2019-03-09 DIAGNOSIS — R296 Repeated falls: Secondary | ICD-10-CM | POA: Diagnosis present

## 2019-03-09 DIAGNOSIS — R2689 Other abnormalities of gait and mobility: Secondary | ICD-10-CM | POA: Insufficient documentation

## 2019-03-09 DIAGNOSIS — M6281 Muscle weakness (generalized): Secondary | ICD-10-CM | POA: Diagnosis present

## 2019-03-09 NOTE — Patient Instructions (Signed)
Deep core 1 and 2 ( handout)

## 2019-03-09 NOTE — Therapy (Signed)
[place] MAIN [facility] SERVICES [address] [place], [place], [address] Phone: [phone]   Fax:  [phone]  Physical Therapy Treatment  Patient Details  Name: Allen Tapia MRN: [id number] Date of Birth: 03-08-1956 Referring Provider (PT): [place]    Encounter Date: 03/09/2019  PT End of Session - 03/09/19 1031    Visit Number  3    Number of Visits  10    Date for PT Re-Evaluation  04/15/19    PT Start Time  1005    PT Stop Time  1103    PT Time Calculation (min)  58 min    Activity Tolerance  Patient tolerated treatment well    Behavior During Therapy  [facility] for tasks assessed/performed       Past Medical History:  Diagnosis Date  . Atypical chest pain   . Barrett's esophagus   . Bilateral cataracts   . Chicken pox   . Chronic constipation   . Coronary artery disease   . Foreign body (FB) in soft tissue   . GERD (gastroesophageal reflux disease)   . Hyperlipidemia   . MS (multiple sclerosis) ([place])   . Shingles     Past Surgical History:  Procedure Laterality Date  . ADENOIDECTOMY    . APPENDECTOMY    . ARTHROPLASTY Right 01/02/2011   partial meniscectomy   . CARDIAC CATHETERIZATION    . COLONOSCOPY  02/05/2008  . COLONOSCOPY WITH PROPOFOL N/A 08/23/2016   Procedure: COLONOSCOPY WITH PROPOFOL;  Surgeon: Lollie Sails, MD;  Location: [facility] ENDOSCOPY;  Service: Endoscopy;  Laterality: N/A;  . ESOPHAGOGASTRODUODENOSCOPY (EGD) WITH PROPOFOL N/A 08/23/2016   Procedure: ESOPHAGOGASTRODUODENOSCOPY (EGD) WITH PROPOFOL;  Surgeon: Lollie Sails, MD;  Location: [facility] ENDOSCOPY;  Service: Endoscopy;  Laterality: N/A;  . ESOPHAGOGASTRODUODENOSCOPY ENDOSCOPY  02/05/2008  . right wrist surgery    . SEPTOPLASTY    . TONSILLECTOMY      There were no vitals filed for this visit.  Subjective Assessment - 03/09/19 1008    Subjective  Pt needs to write up the exercises from last week , step by step. Pt will have cataract  surgery this week. Pt reports that he notices voiding is better when inhaling, but it does not happen all the time.    Pertinent History  Pt was Dx in 2012 with MS, relapsing and remitting. No flare ups. 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 Pt was looking into R TKA surgery but continues to get steroid shots and more recently received a gel shot which has relieved 90% of knee pain. Pt has to rely on holding his hand rail with stair climbing. Pt noticed he has L foot drop for 3-4 years and the podiatrist gave him injection 1.5 years in between shots. Pt has had R knee issues for at least 10 years with pain in the below the patella, with walking and stressful activities: bending and twist the knee. Has not had PT for his L knee.  Pt has had vision issues and due for cataract surgery in 1 weeek. Total vision is not as good.  Late June 2020, pt got shingles and tingling on R side of his face.    Patient Stated Goals  Learn to help with bladder issues and avoid self-cath         [facility] PT Assessment - 03/09/19 1029      AROM   Overall AROM   --  WFL all four directions     Palpation   Spinal mobility  iliac crest levelled     Palpation comment  no more tightness along R QL and B paraspinals                  Pelvic Floor Special Questions - 03/09/19 1245    Diastasis Recti  no fingers width separation.         [facility] PT Treatment/Exercise - 03/09/19 1244      Neuro Re-ed    Neuro Re-ed Details   reviewed HEP, introduced deep core level 1 and 2 with verbal and visual cues                  PT Long Term Goals - 02/05/19 1138      PT LONG TERM GOAL #1   Title  Pt will demo levelled pelvis girdle with use of shoe lift in R shoe across 2 visits in order to restore alignment of pelvic floor and progress to deep core training    Time  2    Period  Weeks    Status  New    Target Date  02/19/19      PT LONG TERM GOAL #2   Title   Pt will demo increased gait speed from 1.17 m/s to > 1.3 m/s in order to ambulate safely in community and to get to bathroom before leakage    Time  8    Period  Weeks    Status  New    Target Date  04/02/19      PT LONG TERM GOAL #3   Title  Pt will demo increased anterior pelvic floor mobility to eliminate urine    Time  4    Status  New    Target Date  03/05/19      PT LONG TERM GOAL #4   Title  Pt will demo increased leg strength from 3/5 to > 4/5 ( B hip abd, R knee/ ext) in order to walk with less deviations and minimize falls    Time  10    Period  Weeks    Status  New    Target Date  04/16/19      PT LONG TERM GOAL #5   Title  Pt will demo decreased 5 x Sit to stand from 2:29 to < 2:00 without BUE support in order to minimize falls    Time  10    Period  Weeks    Status  New    Target Date  04/16/19      Additional Long Term Goals   Additional Long Term Goals  --      PT LONG TERM GOAL #6   Title  Pt will decrease NIH-CPSI score from 49 % to <25 % in order to restore pelvic floor function    Time  10    Period  Weeks    Status  New    Target Date  04/16/19      PT LONG TERM GOAL #7   Title  Pt will report less difficulty with complete elimination of urine from 3 x to < 2 x in order to improve hygiene and function    Time  5    Period  Weeks    Status  New    Target Date  03/12/19            Plan - 03/09/19 1245    Clinical  Impression Statement  Pt demonstrates more equal alignment of iliac crest with shoe lift in R shoe, less back mm tightness with previous manual Tx at last session. Diastasis recti has resolved. These improvements optimizes his intraabdominal pressure system and thus, pt progressed to deep core coordination and strengthening today.  Pt demo'd technique correctly. Pt reports he has noticed better elimination of urine with inhalation. This is sign of progress. Pt continues to benefit from skilled PT.     Personal Factors and Comorbidities   Comorbidity 3+;Comorbidity 2    Comorbidities  GERD, MS, multiple falls    Examination-Activity Limitations  Continence;Locomotion Level    Stability/Clinical Decision Making  Unstable/Unpredictable    Rehab Potential  Good    PT Frequency  1x / week    PT Duration  --   10   PT Treatment/Interventions  Functional mobility training;Moist Heat;Therapeutic activities;Patient/family education;Manual lymph drainage;Therapeutic exercise;Gait training;Iontophoresis 4mg /ml Dexamethasone;Electrical Stimulation;ADLs/Self Care Home Management;Neuromuscular re-education;Balance training;Manual techniques;Scar mobilization;Energy conservation;Splinting;Taping;Spinal Manipulations;Joint Manipulations    Consulted and Agree with Plan of Care  Patient       Patient will benefit from skilled therapeutic intervention in order to improve the following deficits and impairments:  Increased muscle spasms, Postural dysfunction, Improper body mechanics, Abnormal gait, Decreased balance, Impaired flexibility, Decreased endurance, Decreased coordination, Decreased mobility, Decreased scar mobility, Hypomobility, Decreased strength, Decreased range of motion, Decreased activity tolerance, Decreased safety awareness, Difficulty walking  Visit Diagnosis: Other abnormalities of gait and mobility  Repeated falls  Muscle weakness (generalized)     Problem List Patient Active Problem List   Diagnosis Date Noted  . Barrett's esophagus 10/10/2016  . Chronic constipation 10/10/2016  . 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 Encounter for long-term (current) use of other medications 06/08/2013  . Dyspnea, unspecified 04/07/2012  . Multiple sclerosis ([place]) 01/31/2012  . Acid reflux 12/20/2010  . CAD (coronary artery disease) 12/20/2010  . Depression 12/20/2010  . FH: ischemic heart disease 12/20/2010  . Foreign body  12/20/2010  . Hyperlipemia 12/20/2010  . Tobacco abuse 12/20/2010    Jerl Mina ,PT, DPT, E-RYT  03/09/2019, 12:46 PM  [place] MAIN [facility] SERVICES [address] [place], [place], [address] Phone: [phone]   Fax:  [phone]  Name: Allen Tapia MRN: [id number] Date of Birth: [birth year]

## 2019-03-12 HISTORY — PX: CATARACT EXTRACTION, BILATERAL: SHX1313

## 2019-03-16 ENCOUNTER — Encounter: Payer: Medicare Other | Admitting: Physical Therapy

## 2019-03-23 ENCOUNTER — Ambulatory Visit: Payer: Medicare Other | Admitting: Physical Therapy

## 2019-03-26 ENCOUNTER — Other Ambulatory Visit: Payer: Self-pay

## 2019-03-26 ENCOUNTER — Encounter: Payer: Self-pay | Admitting: Pulmonary Disease

## 2019-03-26 ENCOUNTER — Ambulatory Visit (INDEPENDENT_AMBULATORY_CARE_PROVIDER_SITE_OTHER): Payer: Medicare Other | Admitting: Pulmonary Disease

## 2019-03-26 VITALS — BP 122/80 | HR 61 | Temp 97.3°F | Ht 74.0 in | Wt 202.4 lb

## 2019-03-26 DIAGNOSIS — J45998 Other asthma: Secondary | ICD-10-CM

## 2019-03-26 DIAGNOSIS — R059 Cough, unspecified: Secondary | ICD-10-CM

## 2019-03-26 DIAGNOSIS — J4520 Mild intermittent asthma, uncomplicated: Secondary | ICD-10-CM | POA: Diagnosis not present

## 2019-03-26 DIAGNOSIS — R05 Cough: Secondary | ICD-10-CM | POA: Diagnosis not present

## 2019-03-26 DIAGNOSIS — R062 Wheezing: Secondary | ICD-10-CM | POA: Diagnosis not present

## 2019-03-26 DIAGNOSIS — Z9109 Other allergy status, other than to drugs and biological substances: Secondary | ICD-10-CM

## 2019-03-26 DIAGNOSIS — I2510 Atherosclerotic heart disease of native coronary artery without angina pectoris: Secondary | ICD-10-CM | POA: Diagnosis not present

## 2019-03-26 MED ORDER — BUDESONIDE-FORMOTEROL FUMARATE 160-4.5 MCG/ACT IN AERO: 2.0000 | INHALATION_SPRAY | Freq: Two times a day (BID) | RESPIRATORY_TRACT | 3 refills | Status: DC

## 2019-03-26 NOTE — Patient Instructions (Addendum)
Thank you for visiting Dr. Valeta Harms at [facility]. Today we recommend the following:  Orders Placed This Encounter  Procedures  . Ambulatory referral to Cardiology   Continue current inhaler regimen   Can decrease to 1 puff twice daily Symbicort and monitor symptoms.   Return in about 6 months (around 09/23/2019) for with APP or Dr. Valeta Harms.    Please do your part to reduce the spread of COVID-19.

## 2019-03-26 NOTE — Progress Notes (Signed)
Synopsis: Referred in March 2020 for cough by Derinda Late, MD  Subjective:   PATIENT ID: Allen Tapia GENDER: male DOB: 05-Oct-1955, MRN: [id number]  Chief Complaint  Patient presents with  . Follow-up    Patient states he feels better since last visit. Patient states Symbicort has really helped and his cough is gone for the most part.    Patient complains of cough for the past several weeks. He has episodic events since then. He took a group of teenagers to the beach and got sick for a few days with URI symptoms the cough has been persistent. Former smoker, quit 20 years ago, smoked for 20 years, for about 1 ppd. He had pulmonary function tests in 2009. 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 He notices that his cough is worse when its cold. He has had allergies as a child. He had significant allergies as a kid. Never hospitalized as a child for respiratory symptoms. He does have a dry mouth. He does take trazadone and benadryl at bedtime. No eczema. He was dx with MS, dx in 2012. His gait has been the most difficult. He a swallow study 5 years ago. He has had episodes of cough/choking recently.   OV 12/30/2018: Doing well since last office visit.  He used Symbicort for a short period of time.  His symptoms abated.  He did well throughout the summer with no issues.  This fall noticed with the change in weather developed chest tightness with wheezing again.  Had an exacerbation a few weeks ago.  He was called to set up his PFT appointment at this time noticed that he was still having much difficulty breathing.  He did restart his Symbicort samples that he was given in the past.  After about a week of Symbicort his symptoms significantly improved no longer had dyspnea on exertion shortness of breath chest tightness or wheezing.  At this time he is doing much better understands that he needs to be on medication daily.  He has restarted it does need refills of this medication.  OV  03/26/2019: Patient here today for follow-up from her's symptoms of cough and shortness of breath.  He was restarted on Symbicort.  His symptoms have completely dissipated.  No longer having chest tightness no longer having wheezing.  His cough is also gone.  He is doing much better.  Using his inhalers regularly with spacer.  He has occasionally noticed some hoarseness of voice.  Overall respiratory symptoms are better.  Denies hemoptysis shortness of breath chest pain chest tightness or wheezing.   Past Medical History:  Diagnosis Date  . Atypical chest pain   . Barrett's esophagus   . Bilateral cataracts   . Chicken pox   . Chronic constipation   . Coronary artery disease   . Foreign body (FB) in soft tissue   . GERD (gastroesophageal reflux disease)   . Hyperlipidemia   . MS (multiple sclerosis) ([place])   . Shingles      Family History  Problem Relation Age of Onset  . Heart disease Mother   . Hypertension Father   . Heart disease Father   . Cancer Father      Past Surgical History:  Procedure Laterality Date  . ADENOIDECTOMY    . APPENDECTOMY    . ARTHROPLASTY Right 01/02/2011   partial meniscectomy   . CARDIAC CATHETERIZATION    . CATARACT EXTRACTION, BILATERAL  03/12/2019  . COLONOSCOPY  02/05/2008  . COLONOSCOPY  WITH PROPOFOL N/A 08/23/2016   Procedure: COLONOSCOPY WITH PROPOFOL;  Surgeon: Lollie Sails, MD;  Location: [facility] ENDOSCOPY;  Service: Endoscopy;  Laterality: N/A;  . ESOPHAGOGASTRODUODENOSCOPY (EGD) WITH PROPOFOL N/A 08/23/2016   Procedure: ESOPHAGOGASTRODUODENOSCOPY (EGD) WITH PROPOFOL;  Surgeon: Lollie Sails, MD;  Location: [facility] ENDOSCOPY;  Service: Endoscopy;  Laterality: N/A;  . ESOPHAGOGASTRODUODENOSCOPY ENDOSCOPY  02/05/2008  . right wrist surgery    . SEPTOPLASTY    . TONSILLECTOMY      Social History   Socioeconomic History  . Marital status: Married    Spouse name: Not on file  . Number of children: Not on file  . Years of education:  Not on file  . Highest education level: Not on file  Occupational History  . Not on file  Tobacco Use  . Smoking status: Former Smoker    Packs/day: 1.00    Years: 20.00    Pack years: 20.00    Types: Cigarettes    Quit date: 09/23/2006    Years since quitting: 12.5  . Smokeless tobacco: Never Used  Substance and Sexual Activity  . Alcohol use: No  . Drug use: No  . Sexual activity: Not on file  Other Topics Concern  . Not on file  Social History Narrative  . Not on file   Social Determinants of Health   Financial Resource Strain:   . Difficulty of Paying Living Expenses: Not on file  Food Insecurity:   . Worried About Charity fundraiser in the Last Year: Not on file  . Ran Out of Food in the Last Year: Not on file  Transportation Needs:   . Lack of Transportation (Medical): Not on file  . Lack of Transportation (Non-Medical): Not on file  Physical Activity:   . Days of Exercise per Week: Not on file  . Minutes of Exercise per Session: Not on file  Stress:   . Feeling of Stress : Not on file  Social Connections:   . Frequency of Communication with Friends and Family: Not on file  . Frequency of Social Gatherings with Friends and Family: Not on file  . Attends Religious Services: Not on file  . Active Member of Clubs or Organizations: Not on file  . Attends Archivist Meetings: Not on file  . Marital Status: Not on file  Intimate Partner Violence:   . Fear of Current or Ex-Partner: Not on file  . Emotionally Abused: Not on file  . Physically Abused: Not on file  . Sexually Abused: Not on file     No Known Allergies   Outpatient Medications Prior to Visit  Medication Sig Dispense Refill  . acetaminophen (TYLENOL) 500 MG tablet Take 1,000 mg by mouth every 6 (six) hours as needed.    Marland Kitchen amphetamine-dextroamphetamine (ADDERALL XR) 25 MG 24 hr capsule Take 25 mg by mouth every morning.    Marland Kitchen aspirin EC 81 MG tablet Take 81 mg by mouth daily.    . baclofen  (LIORESAL) 20 MG tablet Take 20 mg by mouth 3 (three) times daily.    . budesonide-formoterol (SYMBICORT) 160-4.5 MCG/ACT inhaler Inhale 2 puffs into the lungs every 12 (twelve) hours. 3 Inhaler 3  . buPROPion (WELLBUTRIN XL) 150 MG 24 hr tablet     . cholecalciferol (VITAMIN D) 1000 units tablet Take 1,000 Units by mouth daily. 2 capsules daily (2,000 units)    . Fingolimod HCl (GILENYA) 0.5 MG CAPS Take by mouth every morning.    Marland Kitchen  gabapentin (NEURONTIN) 300 MG capsule     . metoprolol tartrate (LOPRESSOR) 25 MG tablet Take 25 mg by mouth every morning.    . montelukast (SINGULAIR) 10 MG tablet Take 1 tablet (10 mg total) by mouth at bedtime. 90 tablet 3  . omeprazole (PRILOSEC) 20 MG capsule Take 20 mg by mouth 2 (two) times daily before a meal.    . simvastatin (ZOCOR) 40 MG tablet Take 40 mg by mouth daily.    Marland Kitchen Spacer/Aero-Holding Chambers (AEROCHAMBER MV) inhaler Use as instructed 1 each 0  . traZODone (DESYREL) 100 MG tablet Take 200 mg by mouth at bedtime.    Marland Kitchen albuterol (VENTOLIN HFA) 108 (90 Base) MCG/ACT inhaler Inhale 2 puffs into the lungs as needed for wheezing or shortness of breath.    Cristy Friedlander HFA 110 MCG/ACT inhaler INL 1 PUFF ITL BID    . meloxicam (MOBIC) 15 MG tablet Take 1 tablet (15 mg total) by mouth daily. (Patient not taking: Reported on 03/26/2019) 90 tablet 1  . SHINGRIX injection inject 0.5 milliliter intramuscularly  0  . benzonatate (TESSALON) 200 MG capsule Take 200 mg by mouth 3 (three) times daily as needed for cough.    . valACYclovir (VALTREX) 1000 MG tablet      No facility-administered medications prior to visit.    Review of Systems  Constitutional: Negative for chills, fever, malaise/fatigue and weight loss.  HENT: Negative for hearing loss, sore throat and tinnitus.   Eyes: Negative for blurred vision and double vision.  Respiratory: Negative for cough, hemoptysis, sputum production, shortness of breath, wheezing and stridor.   Cardiovascular:  Negative for chest pain, palpitations, orthopnea, leg swelling and PND.  Gastrointestinal: Negative for abdominal pain, constipation, diarrhea, heartburn, nausea and vomiting.  Genitourinary: Negative for dysuria, hematuria and urgency.  Musculoskeletal: Negative for joint pain and myalgias.  Skin: Negative for itching and rash.  Neurological: Negative for dizziness, tingling, weakness and headaches.  Endo/Heme/Allergies: Negative for environmental allergies. Does not bruise/bleed easily.  Psychiatric/Behavioral: Negative for depression. The patient is not nervous/anxious and does not have insomnia.   All other systems reviewed and are negative.    Objective:  Physical Exam Vitals reviewed.  Constitutional:      General: He is not in acute distress.    Appearance: He is well-developed.  HENT:     Head: Normocephalic and atraumatic.  Eyes:     General: No scleral icterus.    Conjunctiva/sclera: Conjunctivae normal.     Pupils: Pupils are equal, round, and reactive to light.  Neck:     Vascular: No JVD.     Trachea: No tracheal deviation.  Cardiovascular:     Rate and Rhythm: Normal rate and regular rhythm.     Heart sounds: Normal heart sounds. No murmur.  Pulmonary:     Effort: Pulmonary effort is normal. No tachypnea, accessory muscle usage or respiratory distress.     Breath sounds: Normal breath sounds. No stridor. No wheezing, rhonchi or rales.  Abdominal:     General: Bowel sounds are normal. There is no distension.     Palpations: Abdomen is soft.     Tenderness: There is no abdominal tenderness.  Musculoskeletal:        General: No tenderness.     Cervical back: Neck supple.  Lymphadenopathy:     Cervical: No cervical adenopathy.  Skin:    General: Skin is warm and dry.     Findings: No rash.  Neurological:  Mental Status: He is alert and oriented to person, place, and time.  Psychiatric:        Behavior: Behavior normal.      Vitals:   03/26/19 1146    BP: 122/80  Pulse: 61  Temp: (!) 97.3 F (36.3 C)  TempSrc: Temporal  SpO2: 95%  Weight: 202 lb 6.4 oz (91.8 kg)  Height: 6\' 2"  (1.88 m)   95% on RA BMI Readings from Last 3 Encounters:  03/26/19 25.99 kg/m  01/08/19 24.27 kg/m  12/30/18 24.17 kg/m   Wt Readings from Last 3 Encounters:  03/26/19 202 lb 6.4 oz (91.8 kg)  01/08/19 189 lb (85.7 kg)  12/30/18 193 lb 6.4 oz (87.7 kg)     CBC    Component Value Date/Time   WBC 3.9 08/23/2016 0848   RBC 4.86 08/23/2016 0848   HGB 14.9 08/23/2016 0848   HCT 44.1 08/23/2016 0848   PLT 215 08/23/2016 0848   MCV 90.8 08/23/2016 0848   MCH 30.7 08/23/2016 0848   MCHC 33.8 08/23/2016 0848   RDW 14.2 08/23/2016 0848   LYMPHSABS 0.1 (L) 08/23/2016 0848   MONOABS 0.6 08/23/2016 0848   EOSABS 0.2 08/23/2016 0848   BASOSABS 0.0 08/23/2016 0848    Chest Imaging: No recent chest imaging   Pulmonary Functions Testing Results: No flowsheet data found.  FeNO: None   Pathology: None   Echocardiogram: None   Heart Catheterization: None     Assessment & Plan:   Mild intermittent asthma without complication  Coronary artery disease involving native coronary artery of native heart without angina pectoris - Plan: Ambulatory referral to Cardiology  Cough  Wheezing  Post viral asthma  Environmental allergies   Discussion:  63 year old gentleman, former smoker, history of environmental allergies, seasonal allergies, history of multiple sclerosis on fingolimod.  Diagnosis of mild intermittent asthma.  Doing well on Symbicort.  Plan: Continue Symbicort 160, decrease to 1 puff, twice daily with spacer See if this helps with his hoarseness of voice and dry mouth symptoms. Patient to return to clinic in 6 months or as needed. If still having significant allergy symptoms can consider addition of daily antihistamine Continue montelukast daily.  RTC 6 months or as needed.  Greater than 50% of this patient's 15-minute  office visit was spent face-to-face discussing above recommendations and treatment plan.   Current Outpatient Medications:  .  acetaminophen (TYLENOL) 500 MG tablet, Take 1,000 mg by mouth every 6 (six) hours as needed., Disp: , Rfl:  .  amphetamine-dextroamphetamine (ADDERALL XR) 25 MG 24 hr capsule, Take 25 mg by mouth every morning., Disp: , Rfl:  .  aspirin EC 81 MG tablet, Take 81 mg by mouth daily., Disp: , Rfl:  .  baclofen (LIORESAL) 20 MG tablet, Take 20 mg by mouth 3 (three) times daily., Disp: , Rfl:  .  budesonide-formoterol (SYMBICORT) 160-4.5 MCG/ACT inhaler, Inhale 2 puffs into the lungs every 12 (twelve) hours., Disp: 3 Inhaler, Rfl: 3 .  buPROPion (WELLBUTRIN XL) 150 MG 24 hr tablet, , Disp: , Rfl:  .  cholecalciferol (VITAMIN D) 1000 units tablet, Take 1,000 Units by mouth daily. 2 capsules daily (2,000 units), Disp: , Rfl:  .  Fingolimod HCl (GILENYA) 0.5 MG CAPS, Take by mouth every morning., Disp: , Rfl:  .  gabapentin (NEURONTIN) 300 MG capsule, , Disp: , Rfl:  .  metoprolol tartrate (LOPRESSOR) 25 MG tablet, Take 25 mg by mouth every morning., Disp: , Rfl:  .  montelukast (SINGULAIR)  10 MG tablet, Take 1 tablet (10 mg total) by mouth at bedtime., Disp: 90 tablet, Rfl: 3 .  omeprazole (PRILOSEC) 20 MG capsule, Take 20 mg by mouth 2 (two) times daily before a meal., Disp: , Rfl:  .  simvastatin (ZOCOR) 40 MG tablet, Take 40 mg by mouth daily., Disp: , Rfl:  .  Spacer/Aero-Holding Chambers (AEROCHAMBER MV) inhaler, Use as instructed, Disp: 1 each, Rfl: 0 .  traZODone (DESYREL) 100 MG tablet, Take 200 mg by mouth at bedtime., Disp: , Rfl:  .  albuterol (VENTOLIN HFA) 108 (90 Base) MCG/ACT inhaler, Inhale 2 puffs into the lungs as needed for wheezing or shortness of breath., Disp: , Rfl:  .  FLOVENT HFA 110 MCG/ACT inhaler, INL 1 PUFF ITL BID, Disp: , Rfl:  .  meloxicam (MOBIC) 15 MG tablet, Take 1 tablet (15 mg total) by mouth daily. (Patient not taking: Reported on  03/26/2019), Disp: 90 tablet, Rfl: 1 .  SHINGRIX injection, inject 0.5 milliliter intramuscularly, Disp: , Rfl: 0   Garner Nash, DO [facility] 03/26/2019 12:04 PM

## 2019-03-30 ENCOUNTER — Other Ambulatory Visit: Payer: Self-pay

## 2019-03-30 ENCOUNTER — Ambulatory Visit: Payer: Medicare Other | Attending: Urology | Admitting: Physical Therapy

## 2019-03-30 DIAGNOSIS — R296 Repeated falls: Secondary | ICD-10-CM | POA: Diagnosis present

## 2019-03-30 DIAGNOSIS — R2689 Other abnormalities of gait and mobility: Secondary | ICD-10-CM | POA: Insufficient documentation

## 2019-03-30 DIAGNOSIS — M6281 Muscle weakness (generalized): Secondary | ICD-10-CM | POA: Diagnosis present

## 2019-03-30 NOTE — Therapy (Signed)
[place] MAIN [facility] SERVICES [address] [place], [place], [address] Phone: [phone]   Fax:  [phone]  Physical Therapy Treatment / Progress Note  Reporting from 02/04/19- 03/30/19   Patient Details  Name: Allen Tapia MRN: [id number] Date of Birth: Apr 19, 1955 Referring Provider (PT): [place]    Encounter Date: 03/30/2019  PT End of Session - 03/30/19 1158    Visit Number  4    Number of Visits  10    Date for PT Re-Evaluation  04/15/19    PT Start Time  1100    PT Stop Time  1158    PT Time Calculation (min)  58 min    Activity Tolerance  Patient tolerated treatment well    Behavior During Therapy  [facility] for tasks assessed/performed       Past Medical History:  Diagnosis Date  . Atypical chest pain   . Barrett's esophagus   . Bilateral cataracts   . Chicken pox   . Chronic constipation   . Coronary artery disease   . Foreign body (FB) in soft tissue   . GERD (gastroesophageal reflux disease)   . Hyperlipidemia   . MS (multiple sclerosis) ([place])   . Shingles     Past Surgical History:  Procedure Laterality Date  . ADENOIDECTOMY    . APPENDECTOMY    . ARTHROPLASTY Right 01/02/2011   partial meniscectomy   . CARDIAC CATHETERIZATION    . CATARACT EXTRACTION, BILATERAL  03/12/2019  . COLONOSCOPY  02/05/2008  . COLONOSCOPY WITH PROPOFOL N/A 08/23/2016   Procedure: COLONOSCOPY WITH PROPOFOL;  Surgeon: Lollie Sails, MD;  Location[facility] ENDOSCOPY;  Service: Endoscopy;  Laterality: N/A;  . ESOPHAGOGASTRODUODENOSCOPY (EGD) WITH PROPOFOL N/A 08/23/2016   Procedure: ESOPHAGOGASTRODUODENOSCOPY (EGD) WITH PROPOFOL;  Surgeon: Lollie Sails, MD;  Location: [facility] ENDOSCOPY;  Service: Endoscopy;  Laterality: N/A;  . ESOPHAGOGASTRODUODENOSCOPY ENDOSCOPY  02/05/2008  . right wrist surgery    . SEPTOPLASTY    . TONSILLECTOMY      There were no vitals filed for this visit.  Subjective Assessment - 03/30/19 1110    Subjective  Pt has been intermittent about his exercises lately. Pt has been doing strenously, removing ply wood, chain linked fence with some help. Pt feels his bladder is better. Pt has stopped drinking soda altogether. When he goes to void and he concentrates on his breathing, most of the time it does help eliminating urine 50% of the time. .    Pertinent History  Pt was Dx in 2012 with MS, relapsing and remitting. No flare ups. 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 Pt was looking into R TKA surgery but continues to get steroid shots and more recently received a gel shot which has relieved 90% of knee pain. Pt has to rely on holding his hand rail with stair climbing. Pt noticed he has L foot drop for 3-4 years and the podiatrist gave him injection 1.5 years in between shots. Pt has had R knee issues for at least 10 years with pain in the below the patella, with walking and stressful activities: bending and twist the knee. Has not had PT for his L knee.  Pt has had vision issues and due for cataract surgery in 1 weeek. Total vision is not as good.  Late June 2020, pt got shingles and tingling on R side of his face.    Patient Stated Goals  Learn to help  with bladder issues and avoid self-cath         [facility] PT Assessment - 03/30/19 1136      Strength   Overall Strength Comments    L hip 4-/5, R 4/5, knee flex/ hip ext  L 4+/5, R 3+/5.       Ambulation/Gait   Gait Comments  1.32 m/s                    [facility] Adult PT Treatment/Exercise - 03/30/19 1136      Therapeutic Activites    Other Therapeutic Activities  reassessed goals       Neuro Re-ed    Neuro Re-ed Details   technique and alignment for new HEP                   PT Long Term Goals - 03/30/19 1122      PT LONG TERM GOAL #1   Title  Pt will demo levelled pelvis girdle with use of shoe lift in R shoe across 2 visits in order to restore alignment of pelvic floor and  progress to deep core training    Time  2    Period  Weeks    Status  Achieved      PT LONG TERM GOAL #2   Title  Pt will demo increased gait speed from 1.17 m/s to > 1.3 m/s in order to ambulate safely in community and to get to bathroom before leakage  ( 03/30/19:  1.3 m/s)    Time  8    Period  Weeks    Status  Achieved      PT LONG TERM GOAL #3   Title  Pt will demo increased anterior pelvic floor mobility to eliminate urine    Time  4    Status  Achieved      PT LONG TERM GOAL #4   Title  Pt will demo increased leg strength from 3/5 to > 4/5 ( B hip abd, R knee/ ext) in order to walk with less deviations and minimize falls  ( 03/30/19:  L hip 4-/5, R 4/5, knee flex/ hip ext  L 4+/5, R 3+/5. )    Time  10    Period  Weeks    Status  Partially Met      PT LONG TERM GOAL #5   Title  Pt will demo decreased 5 x Sit to stand from 2:29 to < 2:00 without BUE support in order to minimize falls  ( 1/4: 13 sec)    Time  10    Period  Weeks    Status  Achieved      PT LONG TERM GOAL #6   Title  Pt will decrease NIH-CPSI score from 49 % to <25 % in order to restore pelvic floor function  ( 1/4/ 21 : 30%)    Time  10    Period  Weeks    Status  Partially Met      PT LONG TERM GOAL #7   Title  Pt will report less difficulty with complete elimination of urine from 3 x to < 2 x in order to improve hygiene and function    Time  5    Period  Weeks    Status  Achieved            Plan - 03/30/19 1157    Clinical Impression Statement  Across the past 4 weeks, pt has achieved 5/7  goals and progressing well towards remaining goals. Pt's improvements include: _increased gait speed and 5 X Sit to Stand speed( lowers risk for falls)   _decreased NIH-CPSI score from 49%to 30% with improved elimination of urine and frequency  _better bladder health habits with no more soda intake and increased water intake   _ leg length difference addressed with shoe lift  _pelvic girdle / SIJ  misalignment, spinal/ pelvic mm imbalances./ scoliosis addressed with manual Tx and proper strengthening  _improved upright posture   Today, focused on specific strengthening of lower kinetic chain. Pt demo'd correctly with cues. Pt continues to benefit from skilled PT to progress to remaining goals.      Personal Factors and Comorbidities  Comorbidity 3+;Comorbidity 2    Comorbidities  GERD, MS, multiple falls    Examination-Activity Limitations  Continence;Locomotion Level    Stability/Clinical Decision Making  Unstable/Unpredictable    Rehab Potential  Good    PT Frequency  1x / week    PT Duration  --   10   PT Treatment/Interventions  Functional mobility training;Moist Heat;Therapeutic activities;Patient/family education;Manual lymph drainage;Therapeutic exercise;Gait training;Iontophoresis 24m/ml Dexamethasone;Electrical Stimulation;ADLs/Self Care Home Management;Neuromuscular re-education;Balance training;Manual techniques;Scar mobilization;Energy conservation;Splinting;Taping;Spinal Manipulations;Joint Manipulations    Consulted and Agree with Plan of Care  Patient       Patient will benefit from skilled therapeutic intervention in order to improve the following deficits and impairments:  Increased muscle spasms, Postural dysfunction, Improper body mechanics, Abnormal gait, Decreased balance, Impaired flexibility, Decreased endurance, Decreased coordination, Decreased mobility, Decreased scar mobility, Hypomobility, Decreased strength, Decreased range of motion, Decreased activity tolerance, Decreased safety awareness, Difficulty walking  Visit Diagnosis: Other abnormalities of gait and mobility  Repeated falls  Muscle weakness (generalized)     Problem List Patient Active Problem List   Diagnosis Date Noted  . Barrett's esophagus 10/10/2016  . Chronic constipation 10/10/2016  . 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 Post-traumatic osteoarthritis of right elbow  04/11/2015  . 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 Encounter for long-term (current) use of other medications 06/08/2013  . Dyspnea, unspecified 04/07/2012  . Multiple sclerosis ([place] 01/31/2012  . Acid reflux 12/20/2010  . CAD (coronary artery disease) 12/20/2010  . Depression 12/20/2010  . FH: ischemic heart disease 12/20/2010  . Foreign body 12/20/2010  . Hyperlipemia 12/20/2010  . Tobacco abuse 12/20/2010    YJerl Mina,PT, DPT, E-RYT  03/30/2019, 1:27 PM  [place] [facility] [address] [place] [address]Phone: [phone]  Fax:  [phone] Name: Allen MODEMRN: [id number] of Birth: 910-14-1957

## 2019-03-30 NOTE — Patient Instructions (Addendum)
Bladder diary provided to mark frequency of urination and the amount of fluid  ___  Clam Shell 45 Degrees   R sideLying with hips and knees bent 45, one pillow between knees and ankles. Lift knee with exhale. Be sure pelvis does not roll backward. Do not arch back. Do 30  On L leg only      Complimentary stretch: [employer] _ foot over _ thigh, opposite knee straight  3 breaths  * Keep pelvis levelled with tactile cue with hand under back of hips  * Slide the ankle of the supporting foot out to decrease the angle which can help level the pelvis   ____  Hamstring with blue band under door Hold onto wall to put loop at ankle Hold onto wall during exercise  Inhale, exhale, bend knee, heel towards butt, lower slow 20 reps , R leg only  Hamstring stretch ( see video)    _____ Hip ext strengthening  h blue band under door Hold onto wall to put loop at ankle Hold onto wall during exercise  Inhale, exhale, keep knee straight, swing R foot back, press ballmounds into floor at the end of the swing back

## 2019-04-06 ENCOUNTER — Ambulatory Visit: Payer: Medicare Other | Admitting: Physical Therapy

## 2019-04-07 DIAGNOSIS — Z8616 Personal history of COVID-19: Secondary | ICD-10-CM

## 2019-04-07 HISTORY — DX: Personal history of COVID-19: Z86.16

## 2019-04-10 ENCOUNTER — Ambulatory Visit: Payer: Medicare Other | Admitting: Cardiology

## 2019-04-13 ENCOUNTER — Ambulatory Visit: Payer: Medicare Other | Admitting: Physical Therapy

## 2019-04-22 ENCOUNTER — Ambulatory Visit: Payer: Medicare Other | Admitting: Urology

## 2019-04-27 ENCOUNTER — Ambulatory Visit: Payer: Medicare Other | Admitting: Physical Therapy

## 2019-05-04 ENCOUNTER — Encounter: Payer: Self-pay | Admitting: Cardiology

## 2019-05-04 ENCOUNTER — Ambulatory Visit (INDEPENDENT_AMBULATORY_CARE_PROVIDER_SITE_OTHER): Payer: Medicare Other | Admitting: Cardiology

## 2019-05-04 ENCOUNTER — Other Ambulatory Visit: Payer: Self-pay

## 2019-05-04 VITALS — BP 112/80 | HR 63 | Ht 74.0 in | Wt 204.2 lb

## 2019-05-04 DIAGNOSIS — E7800 Pure hypercholesterolemia, unspecified: Secondary | ICD-10-CM | POA: Diagnosis not present

## 2019-05-04 DIAGNOSIS — Z7189 Other specified counseling: Secondary | ICD-10-CM

## 2019-05-04 DIAGNOSIS — R0789 Other chest pain: Secondary | ICD-10-CM

## 2019-05-04 DIAGNOSIS — Z8249 Family history of ischemic heart disease and other diseases of the circulatory system: Secondary | ICD-10-CM | POA: Diagnosis not present

## 2019-05-04 DIAGNOSIS — I2510 Atherosclerotic heart disease of native coronary artery without angina pectoris: Secondary | ICD-10-CM | POA: Diagnosis not present

## 2019-05-04 MED ORDER — ROSUVASTATIN CALCIUM 20 MG PO TABS: 20.0000 mg | ORAL_TABLET | Freq: Every day | ORAL | 3 refills | Status: DC

## 2019-05-04 NOTE — Patient Instructions (Signed)
Medication Instructions:  Stop: Simvastatin 40 mg and Metoprolol 25 mg Start: Rosuvastatin 20 mg daily  *If you need a refill on your cardiac medications before your next appointment, please call your pharmacy*  Lab Work: None  Testing/Procedures: None  Follow-Up: At [facility], you and your health needs are our priority.  As part of our continuing mission to provide you with exceptional heart care, we have created designated Provider Care Teams.  These Care Teams include your primary Cardiologist (physician) and Advanced Practice Providers (APPs -  Physician Assistants and Nurse Practitioners) who all work together to provide you with the care you need, when you need it.  Your next appointment:   3 month(s)  The format for your next appointment:   In Person  Provider:   Buford Dresser, MD

## 2019-05-04 NOTE — Progress Notes (Signed)
Cardiology Office Note:    Date:  05/04/2019   ID:  RITVIK POPOCA, DOB 06-27-1955, MRN [id number]  PCP:  Derinda Late, MD  Cardiologist:  Buford Dresser, MD  Referring MD: Garner Nash, DO   CC: new patient evaluation for history of coronary artery disease  History of Present Illness:    Allen Tapia is a 64 y.o. male with a hx of coronary disease, multiple sclerosis, squamous cell skin cancer who is seen as a new consult at the request of Icard, Octavio Graves, DO for the evaluation and management of coronary artery disease.  Cardiac history: Cardiac cath ([facility]) 07/14/2007: obstructive LAD disease only, not intervened on as there was no matching ischemia on stress testing/atypical symptoms, EF 65%. Other vessels with nonobstructive lesions  Patient concerns today: -has taken gilenya for multiple sclerosis for a number of years. Initially had heart pounding but now tolerates. -would like to review cath from [facility]  Cardiovascular risk factors: Prior clinical ASCVD: CAD, no history Comorbid conditions endorses hyperlipidemia, Denies hypertension, diabetes (has been told prediabetes), chronic kidney disease Metabolic syndrome/Obesity: BMI 26 Chronic inflammatory conditions: no specific, does have multiple sclerosis Tobacco use history: former, quit in 2008 in not earlier. Alcohol: none since 2009 Family history: father had 5V CABG in his mid-50s, did well for 5 years, then quickly declined with cancer. Had hypertension. Mother had a viral illness and suspected cardiomyopathy from it, had a pacemaker for the last 15 years of life. Had unclear valve issue, pacemaker turned off at end of like Prior cardiac testing and/or incidental findings on other testing (ie coronary calcium): cath as above Exercise level: stays busy renovating houses. Moves slowly but can do what he wants to do most of the time, but he does feel like he is losing strength/fine motor skills. Current diet:  loves bread, occasional ice cream. Tries to eat well overall, does eat vegetables but not as much as he should.  Recent labs reviewed from [facility]: Tchol 130, TG 55, HDL 50, LDL 69,  CMP reviewed, Cr 0.7, rest of panel normal CBC reviewed, within normal limits  Has chronic MSK chest pain, chronic, always sternal. Occurs with very specific movements, and he knows what will trigger it. Improves with tylenol and/or ibuprofren. He is no longer taking meloxicam.  In a similar sense, we discussed the adderall and CV risk. He feels much more functional with it. We discussed risks/benefits of this. He recently start wellbutrin but still feels better with adderall.  We discussed statins as well. He has not been tried on any other statins in the past.  Also discussed metoprolol. He is taking metoprolol tartrate but only in the AMs.   Has never been told to avoid NSAIDs. We discussed risk of MI theoretically, and he understands the risks. He is on meloxicam and occasionally takes ibuprofen as well.  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   Had covid early January, sick for about 3 days, recovered well.  Past Medical History:  Diagnosis Date  . Atypical chest pain   . Barrett's esophagus   . Bilateral cataracts   . Chicken pox   . Chronic constipation   . Coronary artery disease   . Foreign body (FB) in soft tissue   . GERD (gastroesophageal reflux disease)   . Hyperlipidemia   . MS (multiple sclerosis) ([place])   . Shingles  Past Surgical History:  Procedure Laterality Date  . ADENOIDECTOMY    . APPENDECTOMY    . ARTHROPLASTY Right 01/02/2011   partial meniscectomy   . CARDIAC CATHETERIZATION    . CATARACT EXTRACTION, BILATERAL  03/12/2019  . COLONOSCOPY  02/05/2008  . COLONOSCOPY WITH PROPOFOL N/A 08/23/2016   Procedure: COLONOSCOPY WITH PROPOFOL;  Surgeon: Lollie Sails, MD;  Location: [facility]  ENDOSCOPY;  Service: Endoscopy;  Laterality: N/A;  . ESOPHAGOGASTRODUODENOSCOPY (EGD) WITH PROPOFOL N/A 08/23/2016   Procedure: ESOPHAGOGASTRODUODENOSCOPY (EGD) WITH PROPOFOL;  Surgeon: Lollie Sails, MD;  Location: [facility] ENDOSCOPY;  Service: Endoscopy;  Laterality: N/A;  . ESOPHAGOGASTRODUODENOSCOPY ENDOSCOPY  02/05/2008  . right wrist surgery    . SEPTOPLASTY    . TONSILLECTOMY      Current Medications: Current Outpatient Medications on File Prior to Visit  Medication Sig  . acetaminophen (TYLENOL) 500 MG tablet Take 1,000 mg by mouth every 6 (six) hours as needed.  Marland Kitchen albuterol (VENTOLIN HFA) 108 (90 Base) MCG/ACT inhaler Inhale 2 puffs into the lungs as needed for wheezing or shortness of breath.  . amphetamine-dextroamphetamine (ADDERALL XR) 25 MG 24 hr capsule Take 25 mg by mouth every morning.  Marland Kitchen aspirin EC 81 MG tablet Take 81 mg by mouth daily.  . baclofen (LIORESAL) 20 MG tablet Take 20 mg by mouth 3 (three) times daily.  Marland Kitchen buPROPion (WELLBUTRIN XL) 150 MG 24 hr tablet   . cholecalciferol (VITAMIN D) 1000 units tablet Take 1,000 Units by mouth daily. 2 capsules daily (2,000 units)  . Fingolimod HCl (GILENYA) 0.5 MG CAPS Take by mouth every morning.  Marland Kitchen FLOVENT HFA 110 MCG/ACT inhaler INL 1 PUFF ITL BID  . gabapentin (NEURONTIN) 300 MG capsule   . meloxicam (MOBIC) 15 MG tablet Take 1 tablet (15 mg total) by mouth daily.  . metoprolol tartrate (LOPRESSOR) 25 MG tablet Take 25 mg by mouth every morning.  Marland Kitchen omeprazole (PRILOSEC) 20 MG capsule Take 20 mg by mouth 2 (two) times daily before a meal.  . SHINGRIX injection inject 0.5 milliliter intramuscularly  . simvastatin (ZOCOR) 40 MG tablet Take 40 mg by mouth daily.  Marland Kitchen Spacer/Aero-Holding Chambers (AEROCHAMBER MV) inhaler Use as instructed  . traZODone (DESYREL) 100 MG tablet Take 200 mg by mouth at bedtime.  . budesonide-formoterol (SYMBICORT) 160-4.5 MCG/ACT inhaler Inhale 2 puffs into the lungs every 12 (twelve) hours.  .  montelukast (SINGULAIR) 10 MG tablet Take 1 tablet (10 mg total) by mouth at bedtime.   No current facility-administered medications on file prior to visit.     Allergies:   Patient has no known allergies.   Social History   Tobacco Use  . Smoking status: Former Smoker    Packs/day: 1.00    Years: 20.00    Pack years: 20.00    Types: Cigarettes    Quit date: 09/23/2006    Years since quitting: 12.6  . Smokeless tobacco: Never Used  Substance Use Topics  . Alcohol use: No  . Drug use: No    Family History: family history includes Cancer in his father; Heart disease in his father and mother; Hypertension in his father.  ROS:   Please see the history of present illness.  Additional pertinent ROS: Constitutional: Negative for chills, fever, night sweats, unintentional weight loss  HENT: Negative for ear pain and hearing loss.   Eyes: Negative for loss of vision and eye pain.  Respiratory: Negative for cough, sputum, wheezing.   Cardiovascular: See HPI. Gastrointestinal: Negative for  abdominal pain, melena, and hematochezia.  Genitourinary: Negative for dysuria and hematuria.  Musculoskeletal: Negative for falls and myalgias.  Skin: Negative for itching and rash.  Neurological: Negative for focal weakness, focal sensory changes and loss of consciousness.  Endo/Heme/Allergies: Does not bruise/bleed easily.     EKGs/Labs/Other Studies Reviewed:    The following studies were reviewed today: Cath 06/2007 ([facility]) CORONARY ARTERIOGRAMS Dominance: right SA node origin: right AV node origin: right BranchStenosisLesion TypeTIMI Flow ---------------------------------------------------- Right Coronary Artery Prox RCA40% Discrete Prox RCA40% Discrete Dist RCA40% Discrete RPL120% Discrete RPL2(Small) RPL3(Small) Left  Main L Main30% Discrete Left Circumflex Mid LCX 20% Discrete Mid LCX 40% Discrete OM3(Large)20% Discrete OM3(Large)20% Discrete Left Anterior Descending Prox LAD20% Discrete Mid LAD 40% Diffuse *Mid LAD70% Discrete Mid LAD 40% Discrete Dist LAD20% Discrete D140% Discrete D240% Discrete  MPI ([facility]) 07/08/2007 Left Ventricle Analysis :  Rest Stress Conclusions  - High AnterolateralNormal Normal Normal  - Low Anterolateral Normal Normal Normal  - High Posterolateral Normal Normal Normal  - Low PosterolateralNormal Normal Normal  - High Anterior Normal Normal Normal  - Low AnteriorNormal Normal Normal  - AnteroapicalNormal Normal Normal  - PosterobasalNormal Dec modIschemia  - InferiorNormal Dec modIschemia  - InferoapicalNormal Normal Normal  - InferoseptalNormal Normal Normal  - AnteroseptalNormal Normal Normal   EKG:  EKG is personally reviewed.  The ekg ordered today demonstrates NSR  Recent Labs: No results found for requested labs within last 8760 hours.  Recent Lipid Panel No results found for: CHOL, TRIG, HDL, CHOLHDL, VLDL, LDLCALC, LDLDIRECT  Physical Exam:    VS:  BP 112/80   Pulse 63   Ht 6\' 2"  (1.88 m)   Wt 204 lb 3.2 oz (92.6 kg)   SpO2 93%   BMI 26.22 kg/m     Wt Readings from Last 3  Encounters:  05/04/19 204 lb 3.2 oz (92.6 kg)  03/26/19 202 lb 6.4 oz (91.8 kg)  01/08/19 189 lb (85.7 kg)    GEN: Well nourished, well developed in no acute distress HEENT: Normal, moist mucous membranes NECK: No JVD CARDIAC: regular rhythm, normal S1 and S2, no rubs or gallops. No murmurs. VASCULAR: Radial and DP pulses 2+ bilaterally. No carotid bruits RESPIRATORY:  Clear to auscultation without rales, wheezing or rhonchi  ABDOMEN: Soft, non-tender, non-distended MUSCULOSKELETAL:  Ambulates independently SKIN: Warm and dry, no edema NEUROLOGIC:  Alert and oriented x 3. No focal neuro deficits noted. PSYCHIATRIC:  Normal affect    ASSESSMENT:    1. Coronary artery disease involving native coronary artery of native heart without angina pectoris   2. Pure hypercholesterolemia   3. Family history of heart disease   4. Cardiac risk counseling   5. Counseling on health promotion and disease prevention   6. Other chest pain    PLAN:    Coronary artery disease without angina: family history of heart disease as risk -reviewed his cath from [place] based on report -on aspirin 81 mg daily -counseled on risk of adderall with known CAD -counseled on risk of NSAIDs with known CAD -lipid management, as below -has been on metoprolol for year. No known reduced EF. EF on stress test prior to cath 60%. Will trial cessation and see how he feels.  Chronic musculoskeletal chest pain: long term -discussed red flag symptoms that need immediate medical attention and are concerning for cardiac source of chest pain -discussed risk of NSAIDs, as above  Hypercholesterolemia: Switch simvastatin to rosuvastatin for high intensity statin treatment, f/u 3 mos with  lipids.  Cardiac risk counseling and prevention recommendations: -recommend heart healthy/Mediterranean diet, with whole grains, fruits, vegetable, fish, lean meats, nuts, and olive oil. Limit salt. -recommend moderate walking, 3-5 times/week  for 30-50 minutes each session. Aim for at least 150 minutes.week. Goal should be pace of 3 miles/hours, or walking 1.5 miles in 30 minutes -recommend avoidance of tobacco products. Avoid excess alcohol.  Plan for follow up: 3 mos, labs at that time  Buford Dresser, MD, PhD [place]  [facility]    Medication Adjustments/Labs and Tests Ordered: Current medicines are reviewed at length with the patient today.  Concerns regarding medicines are outlined above.  Orders Placed This Encounter  Procedures  . EKG 12-Lead   Meds ordered this encounter  Medications  . rosuvastatin (CRESTOR) 20 MG tablet    Sig: Take 1 tablet (20 mg total) by mouth daily.    Dispense:  90 tablet    Refill:  3    Patient Instructions  Medication Instructions:  Stop: Simvastatin 40 mg and Metoprolol 25 mg Start: Rosuvastatin 20 mg daily  *If you need a refill on your cardiac medications before your next appointment, please call your pharmacy*  Lab Work: None  Testing/Procedures: None  Follow-Up: At [facility], you and your health needs are our priority.  As part of our continuing mission to provide you with exceptional heart care, we have created designated Provider Care Teams.  These Care Teams include your primary Cardiologist (physician) and Advanced Practice Providers (APPs -  Physician Assistants and Nurse Practitioners) who all work together to provide you with the care you need, when you need it.  Your next appointment:   3 month(s)  The format for your next appointment:   In Person  Provider:   Buford Dresser, MD     Signed, Buford Dresser, MD PhD 05/04/2019  [place]

## 2019-05-18 ENCOUNTER — Other Ambulatory Visit: Payer: Self-pay

## 2019-05-18 ENCOUNTER — Ambulatory Visit: Payer: Medicare Other | Attending: Urology | Admitting: Physical Therapy

## 2019-05-18 DIAGNOSIS — R2689 Other abnormalities of gait and mobility: Secondary | ICD-10-CM | POA: Diagnosis not present

## 2019-05-18 DIAGNOSIS — M6281 Muscle weakness (generalized): Secondary | ICD-10-CM | POA: Diagnosis present

## 2019-05-18 DIAGNOSIS — R296 Repeated falls: Secondary | ICD-10-CM | POA: Diagnosis present

## 2019-05-18 NOTE — Addendum Note (Signed)
Addended by: Jerl Mina on: 05/18/2019 10:48 AM   Modules accepted: Orders

## 2019-05-18 NOTE — Patient Instructions (Addendum)
Applying Pelvic tilts:  Finding a comfortable position when laying on your back  Laying on your back, lift hips up, then scoot tail under, lowering ribs / midback first, then the low back Pillow under knees    Decreasing Low back pain:  Pelvic tilts Forward, Back, and Neutral   STANDING ( neutral is where you want to practice finding more and more often. More weight across the ball mound of feet and heels not only the heels, and not locking the knees.   SITTING: feet under knees, hip width apart. Weigh on the sitting bones. Thumb on the back iliac crest, Index finger at the front of the hip. Rock through 3 positions to find neutral, press in the feet and sense the sitting bones ( ischial tuberosity) in contact to the seat.   Posterior tilt ( thumb is lower)  Anterior tilt ( index finger is lower).   Neutral ( thumb and index finger is levelled)      ---   Apply tilts when seated  toilet this week to promote eliminate

## 2019-05-18 NOTE — Therapy (Signed)
[place] MAIN [facility] SERVICES [address] [place], [place], [address] Phone: [phone]   Fax:  [phone]  Physical Therapy Treatment   Patient Details  Name: Allen Tapia MRN: [id number] Date of Birth: 11/30/1955 Referring Provider (PT): Sn[place]    Encounter Date: 05/18/2019  PT End of Session - 05/18/19 0919    Visit Number  5    Number of Visits  10    Date for PT Re-Evaluation  04/15/19    PT Start Time  0903    PT Stop Time  1000    PT Time Calculation (min)  57 min    Activity Tolerance  Patient tolerated treatment well    Behavior During Therapy  [facility] for tasks assessed/performed       Past Medical History:  Diagnosis Date  . Atypical chest pain   . Barrett's esophagus   . Bilateral cataracts   . Chicken pox   . Chronic constipation   . Coronary artery disease   . Foreign body (FB) in soft tissue   . GERD (gastroesophageal reflux disease)   . Hyperlipidemia   . MS (multiple sclerosis) ([place])   . Shingles     Past Surgical History:  Procedure Laterality Date  . ADENOIDECTOMY    . APPENDECTOMY    . ARTHROPLASTY Right 01/02/2011   partial meniscectomy   . CARDIAC CATHETERIZATION    . CATARACT EXTRACTION, BILATERAL  03/12/2019  . COLONOSCOPY  02/05/2008  . COLONOSCOPY WITH PROPOFOL N/A 08/23/2016   Procedure: COLONOSCOPY WITH PROPOFOL;  Surgeon: Lollie Sails, MD;  Location: [facility] ENDOSCOPY;  Service: Endoscopy;  Laterality: N/A;  . ESOPHAGOGASTRODUODENOSCOPY (EGD) WITH PROPOFOL N/A 08/23/2016   Procedure: ESOPHAGOGASTRODUODENOSCOPY (EGD) WITH PROPOFOL;  Surgeon: Lollie Sails, MD;  Location: [facility] ENDOSCOPY;  Service: Endoscopy;  Laterality: N/A;  . ESOPHAGOGASTRODUODENOSCOPY ENDOSCOPY  02/05/2008  . right wrist surgery    . SEPTOPLASTY    . TONSILLECTOMY      There were no vitals filed for this visit.  Subjective Assessment - 05/18/19 0913    Subjective  Pt recovered from [place].  Pt tried placing a  shoe lift in his work shoe and even with the lowest elevation caused L hip pain. Pt 's urinary frequency has decreased from 26 x / day to 12 x/ day. Pt started a new medication prescribed by urologist. Pt has stopped diet soda.    Pertinent History  Pt was Dx in 2012 with MS, relapsing and remitting. No flare ups. 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 Pt was looking into R TKA surgery but continues to get steroid shots and more recently received a gel shot which has relieved 90% of knee pain. Pt has to rely on holding his hand rail with stair climbing. Pt noticed he has L foot drop for 3-4 years and the podiatrist gave him injection 1.5 years in between shots. Pt has had R knee issues for at least 10 years with pain in the below the patella, with walking and stressful activities: bending and twist the knee. Has not had PT for his L knee.  Pt has had vision issues and due for cataract surgery in 1 weeek. Total vision is not as good.  Late June 2020, pt got shingles and tingling on R side of his face.    Patient Stated Goals  Learn to help with bladder issues and avoid self-cath  [facility] PT Assessment - 05/18/19 0931      Coordination   Gross Motor Movements are Fluid and Coordinated  --   pelvic tilt with poor coordination/ excessive upper spine ov   Fine Motor Movements are Fluid and Coordinated  --   decreased lower kinetic chain co-activation      Strength   Overall Strength Comments  hip abd L 4-/5, R 4/5, hip ext B 4+/5, knee flex/ext  4+/5  B                 Pelvic Floor Special Questions - 05/18/19 0935    External Perineal Exam  through clothing: 10 quick contractions without proper technique, minimal cues     External Palpation  suprapubic fascial region restrictons by lateral border of bladder         [facility] Adult PT Treatment/Exercise - 05/18/19 0921      Therapeutic Activites    Other Therapeutic Activities  provided shoe lift  and discussed pattern to cut pieces to inser tinto work shoes                   PT Long Term Goals - 05/18/19 0919      PT LONG TERM GOAL #1   Title  Pt will demo levelled pelvis girdle with use of shoe lift in R shoe across 2 visits in order to restore alignment of pelvic floor and progress to deep core training    Time  2    Period  Weeks    Status  Achieved      PT LONG TERM GOAL #2   Title  Pt will demo increased gait speed from 1.17 m/s to > 1.3 m/s in order to ambulate safely in community and to get to bathroom before leakage  ( 03/30/19:  1.3 m/s)    Time  8    Period  Weeks    Status  Achieved      PT LONG TERM GOAL #3   Title  Pt will demo increased anterior pelvic floor mobility to eliminate urine    Time  4    Status  Achieved      PT LONG TERM GOAL #4   Title  Pt will demo increased leg strength from 3/5 to > 4/5 ( B hip abd, R knee/ ext) in order to walk with less deviations and minimize falls  ( 03/30/19:  L hip 4-/5, R 4/5, knee flex/ hip ext  L 4+/5, R 3+/5.     05/18/19: knee ext/flex 4+/5 B, 4-/5 hip abd L, 4+/5 R )  )    Time  10    Period  Weeks    Status  Achieved      PT LONG TERM GOAL #5   Title  Pt will demo decreased 5 x Sit to stand from 2:29 to < 2:00 without BUE support in order to minimize falls  ( 1/4: 13 sec)    Time  10    Period  Weeks    Status  Achieved      Additional Long Term Goals   Additional Long Term Goals  Yes      PT LONG TERM GOAL #6   Title  Pt will decrease NIH-CPSI score from 49 % to <25 % in order to restore pelvic floor function  ( 1/4/ 21 : 30%,             05/18/19:  23%   )  Time  10    Period  Weeks    Status  Achieved      PT LONG TERM GOAL #7   Title  Pt will report less difficulty with complete elimination of urine from 3 x to < 2 x in order to improve hygiene and function    Time  5    Period  Weeks    Status  Partially Met      PT LONG TERM GOAL #8   Title  Pt will demo pelvic tilts in standing  without cuing to be able to eliminate urine with less delay    Time  4    Period  Weeks    Status  New    Target Date  06/15/19            Plan - 05/18/19 1046    Clinical Impression Statement  Pt returns to PT after skipping a month due to cataract surgery and contracting COVID. Pt has recovered from [place].    Improvements:   Pt 's urinary frequency has decreased from 26 x / day to 12 x/ day. Pt started a new medication prescribed by urologist. Pt has stopped diet soda.   Pt decreased NIH-CPSI score from 49 % to 23 % indicating improved pelvic function.    Focused today on helping pt improve eliminating urine with less delay with manual Tx over suprapubic area which was restricted by R lateral bladder wall. Pt demo'd improved fascial mobility post Tx. Pt required excessive cues for pelvic tilt in supine and standing. Recommended pt to practice pelvic tilts in seated position on toliet before urination to promote pelvic floor lengthening. Pt demo'd poor pelvic propioception and coordination with pelvic tilt in standing position. Plan to focus on  Improving this issue at next session to help pt urinate in standing with less difficulty. Pt continues to benefit from skilled PT.     Personal Factors and Comorbidities  Comorbidity 3+;Comorbidity 2    Comorbidities  GERD, MS, multiple falls    Examination-Activity Limitations  Continence;Locomotion Level    Stability/Clinical Decision Making  Unstable/Unpredictable    Rehab Potential  Good    PT Frequency  1x / week    PT Duration  --   10   PT Treatment/Interventions  Functional mobility training;Moist Heat;Therapeutic activities;Patient/family education;Manual lymph drainage;Therapeutic exercise;Gait training;Iontophoresis 85m/ml Dexamethasone;Electrical Stimulation;ADLs/Self Care Home Management;Neuromuscular re-education;Balance training;Manual techniques;Scar mobilization;Energy conservation;Splinting;Taping;Spinal Manipulations;Joint  Manipulations    Consulted and Agree with Plan of Care  Patient       Patient will benefit from skilled therapeutic intervention in order to improve the following deficits and impairments:  Increased muscle spasms, Postural dysfunction, Improper body mechanics, Abnormal gait, Decreased balance, Impaired flexibility, Decreased endurance, Decreased coordination, Decreased mobility, Decreased scar mobility, Hypomobility, Decreased strength, Decreased range of motion, Decreased activity tolerance, Decreased safety awareness, Difficulty walking  Visit Diagnosis: Other abnormalities of gait and mobility  Repeated falls  Muscle weakness (generalized)     Problem List Patient Active Problem List   Diagnosis Date Noted  . Barrett's esophagus 10/10/2016  . Chronic constipation 10/10/2016  . 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 Encounter for long-term (current) use of other medications 06/08/2013  . Dyspnea, unspecified 04/07/2012  . Multiple sclerosis ([place] 01/31/2012  . Acid reflux 12/20/2010  . CAD (coronary artery disease) 12/20/2010  . Depression 12/20/2010  . FH: ischemic heart disease 12/20/2010  .  Foreign body 12/20/2010  . Hyperlipemia 12/20/2010  . Tobacco abuse 12/20/2010    Jerl Mina ,PT, DPT, E-RYT  05/18/2019, 10:56 AM  [place] MAIN [facility] SERVICES [address] [place], [place], [address] Phone: [phone]   Fax:  [phone]  Name: Allen Tapia MRN: [id number] Date of Birth: 1956-01-25

## 2019-05-20 ENCOUNTER — Ambulatory Visit (INDEPENDENT_AMBULATORY_CARE_PROVIDER_SITE_OTHER): Payer: Medicare Other | Admitting: Urology

## 2019-05-20 ENCOUNTER — Encounter: Payer: Self-pay | Admitting: Urology

## 2019-05-20 ENCOUNTER — Other Ambulatory Visit: Payer: Self-pay

## 2019-05-20 VITALS — BP 124/73 | HR 71 | Ht 74.0 in | Wt 204.0 lb

## 2019-05-20 DIAGNOSIS — N401 Enlarged prostate with lower urinary tract symptoms: Secondary | ICD-10-CM | POA: Diagnosis not present

## 2019-05-20 DIAGNOSIS — N3281 Overactive bladder: Secondary | ICD-10-CM | POA: Diagnosis not present

## 2019-05-20 DIAGNOSIS — N529 Male erectile dysfunction, unspecified: Secondary | ICD-10-CM | POA: Diagnosis not present

## 2019-05-20 DIAGNOSIS — R350 Frequency of micturition: Secondary | ICD-10-CM | POA: Diagnosis not present

## 2019-05-20 DIAGNOSIS — N138 Other obstructive and reflux uropathy: Secondary | ICD-10-CM

## 2019-05-20 LAB — BLADDER SCAN AMB NON-IMAGING

## 2019-05-20 MED ORDER — TAMSULOSIN HCL 0.4 MG PO CAPS: 0.4000 mg | ORAL_CAPSULE | Freq: Every day | ORAL | 11 refills | Status: DC

## 2019-05-20 MED ORDER — TADALAFIL 5 MG PO TABS: 5.0000 mg | ORAL_TABLET | Freq: Every day | ORAL | 11 refills | Status: DC | PRN

## 2019-05-20 NOTE — Progress Notes (Signed)
   05/20/2019 10:05 AM   Allen Tapia 06/27/1955 [id number]  Reason for visit: Follow up urinary symptoms, ED  HPI: I saw Allen Tapia back in urology clinic for follow-up of his urinary symptoms.  I originally saw him in October 2020 for severe urinary frequency with 25-30 voids per day.  He is minimally bothered overnight with only 1-3 episodes of nocturia.  His past medical history is notable for multiple sclerosis.  He was also drinking high volumes of diet sodas at that time, PVR was normal, and we elected to try behavioral strategies, pelvic floor physical therapy, and Myrbetriq for his urinary frequency.  He stopped taking the Myrbetriq about a month ago, but has noticed persistent improvement in his frequency down to about 15 urinations a day.  He complains of some weakening stream and difficulty initiating the stream over the last few months as well.  He has completely cut out sodas which she feels improved his urination significantly, he also has noticed some benefit from pelvic floor physical therapy.  He tried Flomax in the past over 10 years ago that he did not think improved his symptoms.  Urinalysis at our last visit was benign, and PSA was normal at 0.2.  Bladder scan mildly elevated today at 234 mL.  He also has noticed worsening difficulty with ED and is interested in trying medications for this.  He has never taken medications for ED.  He does not take any nitrates for chest pain.  We a long conversation about his urinary symptoms and ED.  Regarding ED, we discussed PDE5 inhibitors and the risks and benefits, and he would like to trial Cialis 5 mg on demand.  Regarding his urination we discussed that his mixed symptoms may be related to either overactive bladder, BPH, multiple sclerosis, or combination of these things.  We discussed that these are all managed very differently.  I do think he would benefit significantly from urodynamics for more objective picture of his voiding  problems.  While he awaits urodynamic evaluation, he would like to try Flomax to see if this improves his urinary stream.  Trial of Flomax for weak stream Trial of Cialis for ED, good Rx coupon provided Referral placed for urodynamics in [place], RTC 1 to 2 weeks after for results   I spent 30 total minutes on the day of the encounter including pre-visit review of the medical record, face-to-face time with the patient, and post visit ordering of labs/imaging/tests.  Billey Co, [facility] [address] [place], [place] [address] [phone]

## 2019-05-20 NOTE — Patient Instructions (Signed)
Benign Prostatic Hyperplasia  Benign prostatic hyperplasia (BPH) is an enlarged prostate gland that is caused by the normal aging process and not by cancer. The prostate is a walnut-sized gland that is involved in the production of semen. It is located in front of the rectum and below the bladder. The bladder stores urine and the urethra is the tube that carries the urine out of the body. The prostate may get bigger as a man gets older. An enlarged prostate can press on the urethra. This can make it harder to pass urine. The build-up of urine in the bladder can cause infection. Back pressure and infection may progress to bladder damage and kidney (renal) failure. What are the causes? This condition is part of a normal aging process. However, not all men develop problems from this condition. If the prostate enlarges away from the urethra, urine flow will not be blocked. If it enlarges toward the urethra and compresses it, there will be problems passing urine. What increases the risk? This condition is more likely to develop in men over the age of 50 years. What are the signs or symptoms? Symptoms of this condition include:  Getting up often during the night to urinate.  Needing to urinate frequently during the day.  Difficulty starting urine flow.  Decrease in size and strength of your urine stream.  Leaking (dribbling) after urinating.  Inability to pass urine. This needs immediate treatment.  Inability to completely empty your bladder.  Pain when you pass urine. This is more common if there is also an infection.  Urinary tract infection (UTI). How is this diagnosed? This condition is diagnosed based on your medical history, a physical exam, and your symptoms. Tests will also be done, such as:  A post-void bladder scan. This measures any amount of urine that may remain in your bladder after you finish urinating.  A digital rectal exam. In a rectal exam, your health care provider  checks your prostate by putting a lubricated, gloved finger into your rectum to feel the back of your prostate gland. This exam detects the size of your gland and any abnormal lumps or growths.  An exam of your urine (urinalysis).  A prostate specific antigen (PSA) screening. This is a blood test used to screen for prostate cancer.  An ultrasound. This test uses sound waves to electronically produce a picture of your prostate gland. Your health care provider may refer you to a specialist in kidney and prostate diseases (urologist). How is this treated? Once symptoms begin, your health care provider will monitor your condition (active surveillance or watchful waiting). Treatment for this condition will depend on the severity of your condition. Treatment may include:  Observation and yearly exams. This may be the only treatment needed if your condition and symptoms are mild.  Medicines to relieve your symptoms, including: ? Medicines to shrink the prostate. ? Medicines to relax the muscle of the prostate.  Surgery in severe cases. Surgery may include: ? Prostatectomy. In this procedure, the prostate tissue is removed completely through an open incision or with a laparoscope or robotics. ? Transurethral resection of the prostate (TURP). In this procedure, a tool is inserted through the opening at the tip of the penis (urethra). It is used to cut away tissue of the inner core of the prostate. The pieces are removed through the same opening of the penis. This removes the blockage. ? Transurethral incision (TUIP). In this procedure, small cuts are made in the prostate. This lessens   the prostate's pressure on the urethra. ? Transurethral microwave thermotherapy (TUMT). This procedure uses microwaves to create heat. The heat destroys and removes a small amount of prostate tissue. ? Transurethral needle ablation (TUNA). This procedure uses radio frequencies to destroy and remove a small amount of  prostate tissue. ? Interstitial laser coagulation (ILC). This procedure uses a laser to destroy and remove a small amount of prostate tissue. ? Transurethral electrovaporization (TUVP). This procedure uses electrodes to destroy and remove a small amount of prostate tissue. ? Prostatic urethral lift. This procedure inserts an implant to push the lobes of the prostate away from the urethra. Follow these instructions at home:  Take over-the-counter and prescription medicines only as told by your health care provider.  Monitor your symptoms for any changes. Contact your health care provider with any changes.  Avoid drinking large amounts of liquid before going to bed or out in public.  Avoid or reduce how much caffeine or alcohol you drink.  Give yourself time when you urinate.  Keep all follow-up visits as told by your health care provider. This is important. Contact a health care provider if:  You have unexplained back pain.  Your symptoms do not get better with treatment.  You develop side effects from the medicine you are taking.  Your urine becomes very dark or has a bad smell.  Your lower abdomen becomes distended and you have trouble passing your urine. Get help right away if:  You have a fever or chills.  You suddenly cannot urinate.  You feel lightheaded, or very dizzy, or you faint.  There are large amounts of blood or clots in the urine.  Your urinary problems become hard to manage.  You develop moderate to severe low back or flank pain. The flank is the side of your body between the ribs and the hip. These symptoms may represent a serious problem that is an emergency. Do not wait to see if the symptoms will go away. Get medical help right away. Call your local emergency services (911 in the [place] Do not drive yourself to the hospital. Summary  Benign prostatic hyperplasia (BPH) is an enlarged prostate that is caused by the normal aging process and not by  cancer.  An enlarged prostate can press on the urethra. This can make it hard to pass urine.  This condition is part of a normal aging process and is more likely to develop in men over the age of 50 years.  Get help right away if you suddenly cannot urinate. This information is not intended to replace advice given to you by your health care provider. Make sure you discuss any questions you have with your health care provider. Document Revised: 02/04/2018 Document Reviewed: 04/16/2016 Elsevier Patient Education  2020 [employer]   Overactive Bladder, Adult  Overactive bladder refers to a condition in which a person has a sudden need to pass urine. The person may leak urine if he or she cannot get to the bathroom fast enough (urinary incontinence). A person with this condition may also wake up several times in the night to go to the bathroom. Overactive bladder is associated with poor nerve signals between your bladder and your brain. Your bladder may get the signal to empty before it is full. You may also have very sensitive muscles that make your bladder squeeze too soon. These symptoms might interfere with daily work or social activities. What are the causes? This condition may be associated with or caused by:    Urinary tract infection.  Infection of nearby tissues, such as the prostate.  Prostate enlargement.  Surgery on the uterus or urethra.  Bladder stones, inflammation, or tumors.  Drinking too much caffeine or alcohol.  Certain medicines, especially medicines that get rid of extra fluid in the body (diuretics).  Muscle or nerve weakness, especially from: ? A spinal cord injury. ? Stroke. ? Multiple sclerosis. ? Parkinson's disease.  Diabetes.  Constipation. What increases the risk? You may be at greater risk for overactive bladder if you:  Are an older adult.  Smoke.  Are going through menopause.  Have prostate problems.  Have a neurological disease, such  as stroke, dementia, Parkinson's disease, or multiple sclerosis (MS).  Eat or drink things that irritate the bladder. These include alcohol, spicy food, and caffeine.  Are overweight or obese. What are the signs or symptoms? Symptoms of this condition include:  Sudden, strong urge to urinate.  Leaking urine.  Urinating 8 or more times a day.  Waking up to urinate 2 or more times a night. How is this diagnosed? Your health care provider may suspect overactive bladder based on your symptoms. He or she will diagnose this condition by:  A physical exam and medical history.  Blood or urine tests. You might need bladder or urine tests to help determine what is causing your overactive bladder. You might also need to see a health care provider who specializes in urinary tract problems (urologist). How is this treated? Treatment for overactive bladder depends on the cause of your condition and whether it is mild or severe. You can also make lifestyle changes at home. Options include:  Bladder training. This may include: ? Learning to control the urge to urinate by following a schedule that directs you to urinate at regular intervals (timed voiding). ? Doing Kegel exercises to strengthen your pelvic floor muscles, which support your bladder. Toning these muscles can help you control urination, even if your bladder muscles are overactive.  Special devices. This may include: ? Biofeedback, which uses sensors to help you become aware of your body's signals. ? Electrical stimulation, which uses electrodes placed inside the body (implanted) or outside the body. These electrodes send gentle pulses of electricity to strengthen the nerves or muscles that control the bladder. ? Women may use a plastic device that fits into the vagina and supports the bladder (pessary).  Medicines. ? Antibiotics to treat bladder infection. ? Antispasmodics to stop the bladder from releasing urine at the wrong  time. ? Tricyclic antidepressants to relax bladder muscles. ? Injections of botulinum toxin type A directly into the bladder tissue to relax bladder muscles.  Lifestyle changes. This may include: ? Weight loss. Talk to your health care provider about weight loss methods that would work best for you. ? Diet changes. This may include reducing how much alcohol and caffeine you consume, or drinking fluids at different times of the day. ? Not smoking. Do not use any products that contain nicotine or tobacco, such as cigarettes and e-cigarettes. If you need help quitting, ask your health care provider.  Surgery. ? A device may be implanted to help manage the nerve signals that control urination. ? An electrode may be implanted to stimulate electrical signals in the bladder. ? A procedure may be done to change the shape of the bladder. This is done only in very severe cases. Follow these instructions at home: Lifestyle  Make any diet or lifestyle changes that are recommended by your health care   provider. These may include: ? Drinking less fluid or drinking fluids at different times of the day. ? Cutting down on caffeine or alcohol. ? Doing Kegel exercises. ? Losing weight if needed. ? Eating a healthy and balanced diet to prevent constipation. This may include:  Eating foods that are high in fiber, such as fresh fruits and vegetables, whole grains, and beans.  Limiting foods that are high in fat and processed sugars, such as fried and sweet foods. General instructions  Take over-the-counter and prescription medicines only as told by your health care provider.  If you were prescribed an antibiotic medicine, take it as told by your health care provider. Do not stop taking the antibiotic even if you start to feel better.  Use any implants or pessary as told by your health care provider.  If needed, wear pads to absorb urine leakage.  Keep a journal or log to track how much and when you  drink and when you feel the need to urinate. This will help your health care provider monitor your condition.  Keep all follow-up visits as told by your health care provider. This is important. Contact a health care provider if:  You have a fever.  Your symptoms do not get better with treatment.  Your pain and discomfort get worse.  You have more frequent urges to urinate. Get help right away if:  You are not able to control your bladder. Summary  Overactive bladder refers to a condition in which a person has a sudden need to pass urine.  Several conditions may lead to an overactive bladder.  Treatment for overactive bladder depends on the cause and severity of your condition.  Follow your health care provider's instructions about lifestyle changes, doing Kegel exercises, keeping a journal, and taking medicines. This information is not intended to replace advice given to you by your health care provider. Make sure you discuss any questions you have with your health care provider. Document Revised: 07/03/2018 Document Reviewed: 03/28/2017 Elsevier Patient Education  [place] What is urodynamic testing?  Urodynamic tests are done to determine how well your lower urinary tract is working. The lower urinary tract includes your bladder and the part of your body that drains urine from the bladder (urethra). When your kidneys filter your blood, urine is stored in your bladder until you feel the urge to urinate. Urination requires coordination between the nerves and muscles of your bladder and urethra. When your lower urinary tract is working well, you should be able to:  Start urinating when your bladder is full.  Empty your bladder completely.  Control the flow of your urine. Why do I need urodynamic testing? You may need urodynamic testing to help find the cause of any of these problems:  Leaking urine (incontinence).  Problems starting or  stopping your urine flow.  Frequent or painful urination.  Frequent urinary tract infections.  Being unable to empty your bladder completely.  Having strong urges to pass urine (urgency).  Having a weak flow of urine. How do I prepare for the tests?  Ask your health care provider about changing or stopping your regular medicines. This is especially important if you are taking diabetes medicines or blood thinners.  You may be asked to avoid urinating before coming to the test so that you arrive with a full bladder.  Tell a health care provider about: ? Any allergies you have. ? All medicines you are taking, including vitamins, herbs, eye  drops, creams, and over-the-counter medicines. ? Whether you are pregnant or may be pregnant. What are the risks of this testing? Generally, these tests are safe. However, some of the tests have risks, including:  Discomfort.  Frequent urge to urinate.  Bleeding.  Infection.  Allergic reactions to medicines or dyes (contrast material). How is urodynamic testing done? You may have various urodynamic tests. The tests may be done separately or may all be done during one testing visit. You may be given an antibiotic medicine before or after testing to help prevent infection. The types of tests that may be done include: Uroflowmetry This test measures how much urine you pass and how long it takes to pass.  You will urinate into a certain type of toilet or device (flowmeter).  The device will measure the volume and the time of your urine flow.  These measurements will be sent to a computer that creates a graph of your urine flow. Postvoid residual measurement This test measures how much urine is left in your bladder after you urinate.  The test may be done with ultrasound. In this method, sound waves and a computer will be used to create an image of your bladder.  The test can also be done by inserting a thin, flexible tube (catheter) into  your bladder after you urinate. The remaining urine will be removed through the catheter so it can be measured.  Remaining urine will be measured in milliliters (mL). If you have more than 100 mL left in your bladder after you urinate, your bladder is not emptying as it should. Cystometric testing This test uses a type of bladder catheter that can measure pressure.  You may be given a medicine to numb the area (local anesthetic).  The area around the opening of your urethra will be cleaned.  A urinary catheter will be passed through your urethra into your bladder and used to empty your bladder completely.  Then a measuring catheter will be placed, and your bladder will be filled with warm, germ-free (sterile) water.  Pressure measurements will be taken: ? As your bladder fills. ? When you feel the need to urinate. ? As your bladder is emptied.  You may be asked to cough or bear down to check for leakage.  In some cases, your bladder may be filled with a material that shows up on X-rays (contrast material) so that X-ray pictures can be taken during the test. Electromyogram This test measures the electrical activity of the nerves and muscles of your bladder and the opening of your urethra.  Sticky patches (electrodes) will be placed near your rectum and urethra to measure electrical activity.  The measurements will show how well your nerves are communicating with your muscles. What happens after the testing?  You should be able to go home right away and do your usual activities.  You may be told to drink a glass of water every 30 minutes for the first 2 hours after testing.  Taking a warm bath or using warm, wet cloths (warm compresses) may relieve any discomfort near your urethra.  Contact your health care provider if you have: ? Pain. ? Blood in your urine. ? Chills. ? Fever. What do the results mean? Talk with your health care provider about what your results mean. Some  common causes for abnormal results from urodynamic tests include:  Enlarged prostate in men.  Overactive bladder.  Urinary tract infection.  Nervous system diseases.  Spinal cord damage. Questions to ask  your health care provider Ask your health care provider, or the department that is doing the test:  When will my results be ready?  How will I get my results?  What are my treatment options?  What other tests do I need?  What are my next steps? Summary  Urodynamic tests are done to determine how well your lower urinary tract is working. The lower urinary tract includes your bladder and urethra.  You may need urodynamic testing to help find the cause of various problems with urination, such as leaking urine (incontinence) or problems starting or stopping your urine flow.  You may have various urodynamic tests. The tests may be done separately or may all be done during one testing visit.  Talk with your health care provider about what your results mean.  Contact your health care provider if you have pain, chills, a fever, or blood in your urine. This information is not intended to replace advice given to you by your health care provider. Make sure you discuss any questions you have with your health care provider. Document Revised: 07/01/2018 Document Reviewed: 01/14/2017 Elsevier Patient Education  [place].  Tadalafil tablets (Cialis) What is this medicine? TADALAFIL (tah DA la fil) is used to treat erection problems in men. It is also used for enlargement of the prostate gland in men, a condition called benign prostatic hyperplasia or BPH. This medicine improves urine flow and reduces BPH symptoms. This medicine can also treat both erection problems and BPH when they occur together. This medicine may be used for other purposes; ask your health care provider or pharmacist if you have questions. COMMON BRAND NAME(S): Kathaleen Bury, Cialis What should I tell my  health care provider before I take this medicine? They need to know if you have any of these conditions:  bleeding disorders  eye or vision problems, including a rare inherited eye disease called retinitis pigmentosa  anatomical deformation of the penis, Peyronie's disease, or history of priapism (painful and prolonged erection)  heart disease, angina, a history of heart attack, irregular heart beats, or other heart problems  high or low blood pressure  history of blood diseases, like sickle cell anemia or leukemia  history of stomach bleeding  kidney disease  liver disease  stroke  an unusual or allergic reaction to tadalafil, other medicines, foods, dyes, or preservatives  pregnant or trying to get pregnant  breast-feeding How should I use this medicine? Take this medicine by mouth with a glass of water. Follow the directions on the prescription label. You may take this medicine with or without meals. When this medicine is used for erection problems, your doctor may prescribe it to be taken once daily or as needed. If you are taking the medicine as needed, you may be able to have sexual activity 30 minutes after taking it and for up to 36 hours after taking it. Whether you are taking the medicine as needed or once daily, you should not take more than one dose per day. If you are taking this medicine for symptoms of benign prostatic hyperplasia (BPH) or to treat both BPH and an erection problem, take the dose once daily at about the same time each day. Do not take your medicine more often than directed. Talk to your pediatrician regarding the use of this medicine in children. Special care may be needed. Overdosage: If you think you have taken too much of this medicine contact a poison control center or emergency room at once.  NOTE: This medicine is only for you. Do not share this medicine with others. What if I miss a dose? If you are taking this medicine as needed for erection  problems, this does not apply. If you miss a dose while taking this medicine once daily for an erection problem, benign prostatic hyperplasia, or both, take it as soon as you remember, but do not take more than one dose per day. What may interact with this medicine? Do not take this medicine with any of the following medications:  nitrates like amyl nitrite, isosorbide dinitrate, isosorbide mononitrate, nitroglycerin  other medicines for erectile dysfunction like avanafil, sildenafil, vardenafil  other tadalafil products (Adcirca)  riociguat This medicine may also interact with the following medications:  certain drugs for high blood pressure  certain drugs for the treatment of HIV infection or AIDS  certain drugs used for fungal or yeast infections, like fluconazole, itraconazole, ketoconazole, and voriconazole  certain drugs used for seizures like carbamazepine, phenytoin, and phenobarbital  grapefruit juice  macrolide antibiotics like clarithromycin, erythromycin, troleandomycin  medicines for prostate problems  rifabutin, rifampin or rifapentine This list may not describe all possible interactions. Give your health care provider a list of all the medicines, herbs, non-prescription drugs, or dietary supplements you use. Also tell them if you smoke, drink alcohol, or use illegal drugs. Some items may interact with your medicine. What should I watch for while using this medicine? If you notice any changes in your vision while taking this drug, call your doctor or health care professional as soon as possible. Stop using this medicine and call your health care provider right away if you have a loss of sight in one or both eyes. Contact your doctor or health care professional right away if the erection lasts longer than 4 hours or if it becomes painful. This may be a sign of serious problem and must be treated right away to prevent permanent damage. If you experience symptoms of nausea,  dizziness, chest pain or arm pain upon initiation of sexual activity after taking this medicine, you should refrain from further activity and call your doctor or health care professional as soon as possible. Do not drink alcohol to excess (examples, 5 glasses of wine or 5 shots of whiskey) when taking this medicine. When taken in excess, alcohol can increase your chances of getting a headache or getting dizzy, increasing your heart rate or lowering your blood pressure. Using this medicine does not protect you or your partner against HIV infection (the virus that causes AIDS) or other sexually transmitted diseases. What side effects may I notice from receiving this medicine? Side effects that you should report to your doctor or health care professional as soon as possible:  allergic reactions like skin rash, itching or hives, swelling of the face, lips, or tongue  breathing problems  changes in hearing  changes in vision  chest pain  fast, irregular heartbeat  prolonged or painful erection  seizures Side effects that usually do not require medical attention (report to your doctor or health care professional if they continue or are bothersome):  back pain  dizziness  flushing  headache  indigestion  muscle aches  nausea  stuffy or runny nose This list may not describe all possible side effects. Call your doctor for medical advice about side effects. You may report side effects to FDA at 1-800-FDA-1088. Where should I keep my medicine? Keep out of the reach of children. Store at room temperature between 15 and 30  degrees C (59 and 86 degrees F). Throw away any unused medicine after the expiration date. NOTE: This sheet is a summary. It may not cover all possible information. If you have questions about this medicine, talk to your doctor, pharmacist, or health care provider.  2020 Elsevier/Gold Standard (2013-07-31 13:15:49)

## 2019-05-27 ENCOUNTER — Encounter: Payer: Self-pay | Admitting: Cardiology

## 2019-05-27 DIAGNOSIS — E7800 Pure hypercholesterolemia, unspecified: Secondary | ICD-10-CM | POA: Insufficient documentation

## 2019-05-27 DIAGNOSIS — N138 Other obstructive and reflux uropathy: Secondary | ICD-10-CM

## 2019-05-28 ENCOUNTER — Ambulatory Visit: Payer: Medicare Other | Attending: Urology | Admitting: Physical Therapy

## 2019-05-28 ENCOUNTER — Other Ambulatory Visit: Payer: Self-pay

## 2019-05-28 DIAGNOSIS — M6281 Muscle weakness (generalized): Secondary | ICD-10-CM | POA: Diagnosis present

## 2019-05-28 DIAGNOSIS — R2689 Other abnormalities of gait and mobility: Secondary | ICD-10-CM | POA: Diagnosis present

## 2019-05-28 DIAGNOSIS — R296 Repeated falls: Secondary | ICD-10-CM

## 2019-05-28 MED ORDER — TAMSULOSIN HCL 0.4 MG PO CAPS: 0.4000 mg | ORAL_CAPSULE | Freq: Every day | ORAL | 3 refills | Status: DC

## 2019-05-28 NOTE — Therapy (Signed)
[place] MAIN [facility] SERVICES [address] [place], [place], [address] Phone: [phone]   Fax:  [phone]  Physical Therapy Treatment / Discharge Summary  reporting from 02/04/20 to 05/28/19  across 6 visits   Patient Details  Name: Allen Tapia MRN: [id number] Date of Birth: [birth year] Referring Provider (PT): Snin[place]y    Encounter Date: 05/28/2019  PT End of Session - 05/28/19 0811    Visit Number  6    Number of Visits  10    Date for PT Re-Evaluation  06/08/19    PT Start Time  0804    PT Stop Time  0858    PT Time Calculation (min)  54 min    Activity Tolerance  Patient tolerated treatment well    Behavior During Therapy  [facility] for tasks assessed/performed       Past Medical History:  Diagnosis Date  . Atypical chest pain   . Barrett's esophagus   . Bilateral cataracts   . Chicken pox   . Chronic constipation   . Coronary artery disease   . Foreign body (FB) in soft tissue   . GERD (gastroesophageal reflux disease)   . Hyperlipidemia   . MS (multiple sclerosis) ([place])   . Shingles     Past Surgical History:  Procedure Laterality Date  . ADENOIDECTOMY    . APPENDECTOMY    . ARTHROPLASTY Right 01/02/2011   partial meniscectomy   . CARDIAC CATHETERIZATION    . CATARACT EXTRACTION, BILATERAL  03/12/2019  . COLONOSCOPY  02/05/2008  . COLONOSCOPY WITH PROPOFOL N/A 08/23/2016   Procedure: COLONOSCOPY WITH PROPOFOL;  Surgeon: Lollie Sails, MD;  Location: [facility] ENDOSCOPY;  Service: Endoscopy;  Laterality: N/A;  . ESOPHAGOGASTRODUODENOSCOPY (EGD) WITH PROPOFOL N/A 08/23/2016   Procedure: ESOPHAGOGASTRODUODENOSCOPY (EGD) WITH PROPOFOL;  Surgeon: Lollie Sails, MD;  Location: [facility] ENDOSCOPY;  Service: Endoscopy;  Laterality: N/A;  . ESOPHAGOGASTRODUODENOSCOPY ENDOSCOPY  02/05/2008  . right wrist surgery    . SEPTOPLASTY    . TONSILLECTOMY      There were no vitals filed for this visit.  Subjective Assessment -  05/28/19 0812    Subjective  Pt feels his delay with urination has improved by 50%  Pt feels his Sx has changed "A Great Deal Better" based on the [facility].      Pertinent History  Pt was Dx in 2012 with MS, relapsing and remitting. No flare ups. 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 Pt was looking into R TKA surgery but continues to get steroid shots and more recently received a gel shot which has relieved 90% of knee pain. Pt has to rely on holding his hand rail with stair climbing. Pt noticed he has L foot drop for 3-4 years and the podiatrist gave him injection 1.5 years in between shots. Pt has had R knee issues for at least 10 years with pain in the below the patella, with walking and stressful activities: bending and twist the knee. Has not had PT for his L knee.  Pt has had vision issues and due for cataract surgery in 1 weeek. Total vision is not as good.  Late June 2020, pt got shingles and tingling on R side of his face.    Patient Stated Goals  Learn to help with bladder issues and avoid self-cath         [facility] PT Assessment - 05/28/19 0826      Strength  Overall Strength Comments  hip abd L 4-/5, R 4+/5 , hip ext B 4+/5, knee flex/ext  4+/5  B                 Pelvic Floor Special Questions - 05/28/19 0830    External Perineal Exam  through clothing: 10 quick contractions without proper technique, minimal cues     External Palpation  no suprapubic fascial restrictions noted                      PT Long Term Goals - 05/28/19 [id number]      PT LONG TERM GOAL #1   Title  Pt will demo levelled pelvis girdle with use of shoe lift in R shoe across 2 visits in order to restore alignment of pelvic floor and progress to deep core training    Time  2    Period  Weeks    Status  Achieved      PT LONG TERM GOAL #2   Title  Pt will demo increased gait speed from 1.17 m/s to > 1.3 m/s in order to ambulate safely in community and to  get to bathroom before leakage  ( 03/30/19:  1.3 m/s)    Time  8    Period  Weeks    Status  Achieved      PT LONG TERM GOAL #3   Title  Pt will demo increased anterior pelvic floor mobility to eliminate urine    Time  4    Status  Achieved      PT LONG TERM GOAL #4   Title  Pt will demo increased leg strength from 3/5 to > 4/5 ( B hip abd, R knee/ ext) in order to walk with less deviations and minimize falls  ( 03/30/19:  L hip 4-/5, R 4/5, knee flex/ hip ext  L 4+/5, R 3+/5.     05/18/19: knee ext/flex 4+/5 B, 4-/5 hip abd L, 4+/5 R )  )    Time  10    Period  Weeks    Status  Achieved      PT LONG TERM GOAL #5   Title  Pt will demo decreased 5 x Sit to stand from 2:29 to < 2:00 without BUE support in order to minimize falls  ( 1/4: 13 sec)    Time  10    Period  Weeks    Status  Achieved      PT LONG TERM GOAL #6   Title  Pt will decrease NIH-CPSI score from 49 % to <25 % in order to restore pelvic floor function  ( 1/4/ 21 : 30%,             05/18/19:  23%   )    Time  10    Period  Weeks    Status  Achieved      PT LONG TERM GOAL #7   Title  Pt will report less difficulty with complete elimination of urine from 3 x to < 2 x in order to improve hygiene and function    Time  5    Period  Weeks    Status  Achieved      PT LONG TERM GOAL #8   Title  Pt will demo pelvic tilts in standing without cuing to be able to eliminate urine with less delay    Time  4    Period  Weeks    Status  Achieved            Plan - 05/28/19 2206    Clinical Impression Statement  Pt has achieved 100% of goals across visits. Despite a hiatus from PT due to cataract surgery and getting COVID, pt has remained compliant to HEP and has maintained the outcomes of manual Tx , HEP, neuromuscular reeducation, and body mechanics. Pt feels his Sx has changed "A Great Deal Better" based on the GROC scale.  Pt achieved the following improvements: _NIH-CPSI score from 49 % to 23%  _levelled pelvic girdle  with shoe lift 2/2 leg length difference _gait speed increased from 1.13 m/s to 1.3 m/s  _less forward head / thoracic kyphosis/ pelvic tilts  _stronger deep core / pelvic floor coordination and strength _stronger hip strength _decreased pelvic floor mm tightness and increased ability to eliminate urinate completely _reports his delay with urination has improved by 50%   Pt was educated on the importance to continue strengthening L hip abductors, monitor for signs of acute urinary retention which will require medical attention immediately, understand the progression of MS can lead to neurogenic bladder with different bladder training strategies. Pt is ready for d/c at this time.  _   Personal Factors and Comorbidities  Comorbidity 3+;Comorbidity 2    Comorbidities  GERD, MS, multiple falls    Examination-Activity Limitations  Continence;Locomotion Level    Stability/Clinical Decision Making  Unstable/Unpredictable    Rehab Potential  Good    PT Frequency  1x / week    PT Duration  --   10   PT Treatment/Interventions  Functional mobility training;Moist Heat;Therapeutic activities;Patient/family education;Manual lymph drainage;Therapeutic exercise;Gait training;Iontophoresis 4mg /ml Dexamethasone;Electrical Stimulation;ADLs/Self Care Home Management;Neuromuscular re-education;Balance training;Manual techniques;Scar mobilization;Energy conservation;Splinting;Taping;Spinal Manipulations;Joint Manipulations    Consulted and Agree with Plan of Care  Patient       Patient will benefit from skilled therapeutic intervention in order to improve the following deficits and impairments:  Increased muscle spasms, Postural dysfunction, Improper body mechanics, Abnormal gait, Decreased balance, Impaired flexibility, Decreased endurance, Decreased coordination, Decreased mobility, Decreased scar mobility, Hypomobility, Decreased strength, Decreased range of motion, Decreased activity tolerance, Decreased  safety awareness, Difficulty walking  Visit Diagnosis: Other abnormalities of gait and mobility  Repeated falls  Muscle weakness (generalized)     Problem List Patient Active Problem List   Diagnosis Date Noted  . Pure hypercholesterolemia 05/27/2019  . Status post laser cataract surgery, left 02/11/2019  . Barrett's esophagus 10/10/2016  . Chronic constipation 10/10/2016  . 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 Encounter for long-term (current) use of other medications 06/08/2013  . Dyspnea, unspecified 04/07/2012  . Multiple sclerosis ([place]) 01/31/2012  . Acid reflux 12/20/2010  . CAD (coronary artery disease) 12/20/2010  . Depression 12/20/2010  . Family history of heart disease 12/20/2010  . Foreign body 12/20/2010  . Hyperlipemia 12/20/2010  . Tobacco abuse 12/20/2010    Jerl Mina ,PT, DPT, E-RYT  05/28/2019, 10:17 PM  [place] MAIN [facility] SERVICES [address] [place], [place], [address] Phone: [phone]   Fax:  [phone]  Name: Allen Tapia MRN: [id number] Date of Birth: 1956/02/11

## 2019-05-28 NOTE — Patient Instructions (Addendum)
  Information on Acute   [email or web address]  [email or web address]  ___   Maintain your stretches, deep core exercises,  Maintain upright posture   Consider PT for R knee for less pain   ___   Clam Shell 45 Degrees   Lying with hips and knees bent 45, one pillow between knees and ankles. Lift knee with exhale. Be sure pelvis does not roll backward. Do not arch back. Do 20 times, each leg, 2 times per day.      Complimentary stretch: Figure-4  3 breaths  * Keep pelvis levelled with tactile cue with hand under back of hips  * Slide the ankle of the supporting foot out to decrease the angle which can help level the pelvis    ___   Standing pelvic tilts by the wall to bring awareness anterior tilt which can be applied with lengthened pelvic floor muscles when urinating in standing position

## 2019-05-28 NOTE — Telephone Encounter (Signed)
Pt requests RX for tamsolusion be transferred to [facility]. RX sent.

## 2019-06-04 ENCOUNTER — Other Ambulatory Visit: Payer: Self-pay | Admitting: Urology

## 2019-06-17 ENCOUNTER — Other Ambulatory Visit: Payer: Self-pay

## 2019-06-17 ENCOUNTER — Encounter: Payer: Self-pay | Admitting: Urology

## 2019-06-17 ENCOUNTER — Ambulatory Visit (INDEPENDENT_AMBULATORY_CARE_PROVIDER_SITE_OTHER): Payer: Medicare Other | Admitting: Urology

## 2019-06-17 VITALS — BP 163/78 | HR 81 | Ht 74.0 in | Wt 204.0 lb

## 2019-06-17 DIAGNOSIS — N529 Male erectile dysfunction, unspecified: Secondary | ICD-10-CM

## 2019-06-17 DIAGNOSIS — N401 Enlarged prostate with lower urinary tract symptoms: Secondary | ICD-10-CM | POA: Diagnosis not present

## 2019-06-17 DIAGNOSIS — N138 Other obstructive and reflux uropathy: Secondary | ICD-10-CM

## 2019-06-17 DIAGNOSIS — N3281 Overactive bladder: Secondary | ICD-10-CM | POA: Diagnosis not present

## 2019-06-17 MED ORDER — SILDENAFIL CITRATE 20 MG PO TABS: 20.0000 mg | ORAL_TABLET | ORAL | 11 refills | Status: DC | PRN

## 2019-06-17 NOTE — Patient Instructions (Signed)

## 2019-06-17 NOTE — Progress Notes (Signed)
   06/17/2019 9:53 AM   Costella Hatcher 02-16-1956 [id number]  Reason for visit: Follow up BPH/OAB, discuss UDS, ED  HPI: I saw Mr. Swindall back in urology clinic for follow-up of his urinary symptoms and ED.  I originally saw him in October 2020 for severe urinary frequency with 25-30 voids per day.  He is minimally bothered overnight with only 1-3 episodes of nocturia.  His past medical history is notable for multiple sclerosis.  He was also drinking high volumes of diet sodas at that time, and we elected to try behavioral strategies, pelvic floor physical therapy, and Myrbetriq for his urinary frequency.  He noticed significant improvement in his frequency by cutting out diet sodas and the pelvic floor physical therapy, but he did not feel the Myrbetriq helped.  At our last visit, he was having some weakening stream and difficulty initiating stream with mildly elevated PVR of 234 mL.  I recommended urodynamics, followed by trial of Flomax. Urinalysis at our last visit was benign, and PSA was normal at 0.2.  There is no prior imaging to evaluate prostate volume  We reviewed his urodynamics today.  They show max capacity of 470 mL, for sensation at 330 mL, normal desire 368 mL, and strong desire 397 mL.  The bladder was unstable, and first unstable contraction was at 349 mL.  He could sense the unstable contractions but was able to inhibit any leaking.  There was no stress incontinence.  He voided with a prolonged, interrupted, and obstructed flow pattern with significant intermittency.  Max flow was 7 mL/S with an average flow of 1.2 mL/s, with max detrusor pressure of 57 cmH2O, and PVR of 115 mL.   This was consistent with obstruction.  He started taking the Flomax a few days after completing urodynamics and reports a "remarkable" difference in his urinary symptoms.  He really denies any urinary complaints today and feels his strength of stream and time to initiate voiding has improved significantly.   He is delighted with his urinary symptoms at this time.  He has some minimal urgency or leakage that is non-bothersome.  We had a long conversation about his urodynamic findings and surgical options in the future if he would like to get off of Flomax or pursue more definitive treatment.  I would be very careful prior to pursuing a outlet procedure with his MS and unstable contractions on urodynamics.  He may be a better candidate for UroLift as opposed to a HoLEP, but would be dependent on prostate volume and anatomy.  He would like to continue Flomax at this time and is not interested in surgical options currently which is very reasonable.  Regarding his erectile dysfunction, he tried the Cialis up to 20 mg on demand without any significant improvement in his erections.  He is interested in a trial of Revatio at this time.  He does not want to pursue penile injections in the future for refractory ED.  Continue Flomax 0.4 mg nightly Trial of Revatio for ED, good Rx coupon provided RTC 6 months with PVR   Billey Co, MD  [place] [address], [place], [place] [address] [phone]

## 2019-07-06 ENCOUNTER — Ambulatory Visit (INDEPENDENT_AMBULATORY_CARE_PROVIDER_SITE_OTHER): Payer: Medicare Other | Admitting: Podiatry

## 2019-07-06 ENCOUNTER — Other Ambulatory Visit: Payer: Self-pay

## 2019-07-06 ENCOUNTER — Encounter: Payer: Self-pay | Admitting: Podiatry

## 2019-07-06 VITALS — Temp 98.2°F

## 2019-07-06 DIAGNOSIS — M7751 Other enthesopathy of right foot: Secondary | ICD-10-CM | POA: Diagnosis not present

## 2019-07-06 DIAGNOSIS — I2510 Atherosclerotic heart disease of native coronary artery without angina pectoris: Secondary | ICD-10-CM | POA: Diagnosis not present

## 2019-07-06 NOTE — Progress Notes (Signed)
He presents today for follow-up of his MS and how it is affecting his feet.  He states that my right ankle still has that foot drop but after the injections it does not cause me to stumble as much.  He is wondering if he could have another injection.  He is also complaining of painful feet bilaterally states that it seems to bother him worse in the evenings.  He states that currently he is tapering gabapentin for postherpetic neuralgia that he was taking for shingles outbreak of the facial nerve right.  He states that it really has not seemed to help 1 where the other with his feet.  Objective: Vital signs are stable he is alert oriented x3 pulses are palpable.  Neurologic sensorium is intact.  Deep tendon reflexes are intact.  Muscle strength is normal and symmetrical bilateral.  Orthopedic evaluation of straits all joints distal ankle full range of motion without crepitation.  Cutaneous evaluation of straits supple well-hydrated cutis no open lesions or wounds are noted.  He has discomfort on palpation of the right ankle  Assessment: Capsulitis ankle joint with mild foot drop associated with MS.  Neuralgias neuropathy bilateral foot.  Plan: Discussed etiology pathology conservative surgical therapies at this point he would like to get off the gabapentin I recommended Lyrica.  He will talk to his neurologist about getting started with Lyrica.  For not only the postherpetic neuralgia but also his feet.  I did inject the ankle joint today with dexamethasone and Kenalog as well as local anesthetic.  He tolerated procedure well without complications.

## 2019-08-03 ENCOUNTER — Ambulatory Visit: Payer: Medicare Other | Admitting: Cardiology

## 2019-08-13 ENCOUNTER — Ambulatory Visit: Payer: Medicare Other | Admitting: Cardiology

## 2019-08-31 ENCOUNTER — Ambulatory Visit (INDEPENDENT_AMBULATORY_CARE_PROVIDER_SITE_OTHER): Payer: Medicare Other | Admitting: Cardiology

## 2019-08-31 ENCOUNTER — Encounter: Payer: Self-pay | Admitting: Cardiology

## 2019-08-31 ENCOUNTER — Other Ambulatory Visit: Payer: Self-pay

## 2019-08-31 VITALS — BP 122/74 | HR 65 | Ht 73.0 in | Wt 195.6 lb

## 2019-08-31 DIAGNOSIS — Z7189 Other specified counseling: Secondary | ICD-10-CM

## 2019-08-31 DIAGNOSIS — I2510 Atherosclerotic heart disease of native coronary artery without angina pectoris: Secondary | ICD-10-CM

## 2019-08-31 DIAGNOSIS — Z8249 Family history of ischemic heart disease and other diseases of the circulatory system: Secondary | ICD-10-CM

## 2019-08-31 DIAGNOSIS — E7800 Pure hypercholesterolemia, unspecified: Secondary | ICD-10-CM

## 2019-08-31 NOTE — Progress Notes (Signed)
Cardiology Office Note:    Date:  08/31/2019   ID:  Allen Tapia, DOB [birth year], MRN [id number]  PCP:  Derinda Late, MD  Cardiologist:  Buford Dresser, MD  Referring MD: Derinda Late, MD   CC: follow up  History of Present Illness:    Allen Tapia is a 64 y.o. male with a hx of coronary disease, multiple sclerosis, squamous cell skin cancer who is seen for follow up. I initially met him 05/04/19 as a new consult at the request of Derinda Late, MD for the evaluation and management of coronary artery disease.  Cardiac history: Cardiac cath ([facility]) 07/14/2007: obstructive LAD disease only, not intervened on as there was no matching ischemia on stress testing/atypical symptoms, EF 65%. Other vessels with nonobstructive lesions Risk: hyperlipidemia, former tobacco use.   Has chronic MSK chest pain, chronic, always sternal. Occurs with very specific movements, and he knows what will trigger it.  Have discussed adderall at prior visit.  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   Today: Doing well overall. 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 Discussed benefit of aspirin vs risk of bleeding. No melena/hematochezia.hematuria.  Working on diet, likes to eat butter but has otherwise tried to eat well. Very active, hiked [facility] yesterday without chest pain or shortness of breath. Does stumble/uses cane due to foot issue.   Denies shortness of breath at rest or with normal exertion. No PND, orthopnea, LE edema or unexpected weight gain. No syncope. Rare palpitations. Atypical chest pain stable, not exertional, better with activity.   Reviewed labs from [facility] from 08/21/19. BMET, LFTs normal. Lipids: Tchol 123, TG 47, HDL 55, LDL 59. Excellent, improved with rosuvastatin. Multiple sclerosis is stable.    Past Medical History:  Diagnosis Date  . Atypical chest pain   . Barrett's esophagus   . Bilateral cataracts   . Chicken pox   . Chronic constipation   . Coronary artery disease   . Foreign body (FB) in soft tissue   . GERD (gastroesophageal reflux disease)   . Hyperlipidemia   . MS (multiple sclerosis) ([place])   . Shingles     Past Surgical History:  Procedure Laterality Date  . ADENOIDECTOMY    . APPENDECTOMY    . ARTHROPLASTY Right 01/02/2011   partial meniscectomy   . CARDIAC CATHETERIZATION    . CATARACT EXTRACTION, BILATERAL  03/12/2019  . COLONOSCOPY  02/05/2008  . COLONOSCOPY WITH PROPOFOL N/A 08/23/2016   Procedure: COLONOSCOPY WITH PROPOFOL;  Surgeon: Lollie Sails, MD;  Location: [facility] ENDOSCOPY;  Service: Endoscopy;  Laterality: N/A;  . ESOPHAGOGASTRODUODENOSCOPY (EGD) WITH PROPOFOL N/A 08/23/2016   Procedure: ESOPHAGOGASTRODUODENOSCOPY (EGD) WITH PROPOFOL;  Surgeon: Lollie Sails, MD;  Location: [facility] ENDOSCOPY;  Service: Endoscopy;  Laterality: N/A;  . ESOPHAGOGASTRODUODENOSCOPY ENDOSCOPY  02/05/2008  . right wrist surgery    . SEPTOPLASTY    . TONSILLECTOMY      Current Medications: Current Outpatient Medications on File Prior to Visit  Medication Sig  . acetaminophen (TYLENOL) 500 MG tablet Take 1,000 mg by mouth every 6 (six) hours as needed.  Marland Kitchen albuterol (VENTOLIN HFA) 108 (90 Base) MCG/ACT inhaler Inhale 2 puffs into the lungs as needed for wheezing or shortness of breath.  . amphetamine-dextroamphetamine (ADDERALL XR) 25 MG 24 hr capsule Take by mouth.  Marland Kitchen  aspirin EC 81 MG tablet Take 81 mg by mouth daily.  . baclofen (LIORESAL) 20 MG tablet Take 20 mg by mouth 3 (three) times daily.  Marland Kitchen buPROPion (WELLBUTRIN XL) 150 MG 24 hr tablet   . cholecalciferol (VITAMIN D) 1000 units tablet Take 1,000 Units by mouth daily. 2 capsules daily (2,000 units)  . Fingolimod HCl (GILENYA) 0.5 MG CAPS Take by mouth every morning.  . fluorouracil (EFUDEX) 5 % cream  APPLY EXTERNALLY TO THE AFFECTED AREA TWICE DAILY  . gabapentin (NEURONTIN) 300 MG capsule   . montelukast (SINGULAIR) 10 MG tablet Take 1 tablet (10 mg total) by mouth at bedtime.  Marland Kitchen omeprazole (PRILOSEC) 40 MG capsule Take 40 mg by mouth daily.  . rosuvastatin (CRESTOR) 20 MG tablet Take 1 tablet (20 mg total) by mouth daily.  Marland Kitchen SHINGRIX injection inject 0.5 milliliter intramuscularly  . sildenafil (REVATIO) 20 MG tablet Take 1 tablet (20 mg total) by mouth as needed.  Marland Kitchen Spacer/Aero-Holding Chambers (AEROCHAMBER MV) inhaler Use as instructed  . tamsulosin (FLOMAX) 0.4 MG CAPS capsule Take 1 capsule (0.4 mg total) by mouth daily.  . traZODone (DESYREL) 100 MG tablet Take 200 mg by mouth at bedtime.  . budesonide-formoterol (SYMBICORT) 160-4.5 MCG/ACT inhaler Inhale 2 puffs into the lungs every 12 (twelve) hours.   No current facility-administered medications on file prior to visit.     Allergies:   Patient has no known allergies.   Social History   Tobacco Use  . Smoking status: Former Smoker    Packs/day: 1.00    Years: 20.00    Pack years: 20.00    Types: Cigarettes    Quit date: 09/23/2006    Years since quitting: 12.9  . Smokeless tobacco: Never Used  Substance Use Topics  . Alcohol use: No  . Drug use: No    Family History: family history includes Cancer in his father; Heart disease in his father and mother; Hypertension in his father. father had 5V CABG in his mid-50s, did well for 5 years, then quickly declined with cancer. Had hypertension. Mother had a viral illness and suspected cardiomyopathy from it, had a pacemaker for the last 15 years of life. Had unclear valve issue, pacemaker turned off at end of life  ROS:   Please see the history of present illness.  Additional pertinent ROS otherwise unremarkable.  EKGs/Labs/Other Studies Reviewed:    The following studies were reviewed today: Cath 06/2007 ([facility]) CORONARY ARTERIOGRAMS Dominance: right SA node  origin: right AV node origin: right BranchStenosisLesion TypeTIMI Flow ---------------------------------------------------- Right Coronary Artery Prox RCA40% Discrete Prox RCA40% Discrete Dist RCA40% Discrete RPL120% Discrete RPL2(Small) RPL3(Small) Left Main L Main30% Discrete Left Circumflex Mid LCX 20% Discrete Mid LCX 40% Discrete OM3(Large)20% Discrete OM3(Large)20% Discrete Left Anterior Descending Prox LAD20% Discrete Mid LAD 40% Diffuse *Mid LAD70% Discrete Mid LAD 40% Discrete Dist LAD20% Discrete D140% Discrete D240% Discrete  MPI ([facility]) 07/08/2007 Left Ventricle Analysis :  Rest Stress Conclusions  - High AnterolateralNormal Normal Normal  - Low Anterolateral Normal Normal Normal  - High Posterolateral Normal Normal Normal  - Low PosterolateralNormal Normal Normal  - High Anterior Normal Normal Normal  - Low AnteriorNormal Normal Normal  - AnteroapicalNormal Normal Normal  - PosterobasalNormal Dec modIschemia  - InferiorNormal Dec modIschemia  - InferoapicalNormal Normal Normal  - InferoseptalNormal Normal Normal  - AnteroseptalNormal  Normal Normal   EKG:  EKG is personally reviewed.  The ekg ordered 05/04/19 demonstrates NSR  Recent Labs: No results found for  requested labs within last 8760 hours.  Recent Lipid Panel No results found for: CHOL, TRIG, HDL, CHOLHDL, VLDL, LDLCALC, LDLDIRECT  Physical Exam:    VS:  BP 122/74   Pulse 65   Ht _0  (1.854 m)   Wt 195 lb 9.6 oz (88.7 kg)   SpO2 94%   BMI 25.81 kg/m     Wt Readings from Last 3 Encounters:  08/31/19 195 lb 9.6 oz (88.7 kg)  06/17/19 204 lb (92.5 kg)  05/20/19 204 lb (92.5 kg)    GEN: Well nourished, well developed in no acute distress HEENT: Normal, moist mucous membranes NECK: No JVD CARDIAC: regular rhythm, normal S1 and S2, no rubs or gallops. No murmur. VASCULAR: Radial and DP pulses 2+ bilaterally. No carotid bruits RESPIRATORY:  Clear to auscultation without rales, wheezing or rhonchi  ABDOMEN: Soft, non-tender, non-distended MUSCULOSKELETAL:  Ambulates independently SKIN: Warm and dry, no edema NEUROLOGIC:  Alert and oriented x 3. No focal neuro deficits noted. PSYCHIATRIC:  Normal affect   ASSESSMENT:    1. Coronary artery disease involving native coronary artery of native heart without angina pectoris   2. Pure hypercholesterolemia   3. Family history of heart disease   4. Cardiac risk counseling   5. Counseling on health promotion and disease prevention    PLAN:    Coronary artery disease without angina: family history of heart disease is his primary risk -no typical angina, only atypical chest discomfort that improves with exercise -on aspirin 81 mg daily -Has been counseled on adderall and NSAIDs with known CAD -lipid management, as below -doing well off metoprolol -discussed red flag symptoms that need immediate medical attention  Hypercholesterolemia: -tolerating rosuvastatin 20 mg daily, lipids as noted. LDL 59. -discussed diet and exercise today, doing well with this  Cardiac risk counseling and prevention  recommendations: -recommend heart healthy/Mediterranean diet, with whole grains, fruits, vegetable, fish, lean meats, nuts, and olive oil. Limit salt. -recommend moderate walking, 3-5 times/week for 30-50 minutes each session. Aim for at least 150 minutes.week. Goal should be pace of 3 miles/hours, or walking 1.5 miles in 30 minutes -recommend avoidance of tobacco products. Avoid excess alcohol.  Plan for follow up: 6 mos or sooner as needed  Buford Dresser, MD, PhD [place]  [facility]    Medication Adjustments/Labs and Tests Ordered: Current medicines are reviewed at length with the patient today.  Concerns regarding medicines are outlined above.  No orders of the defined types were placed in this encounter.  No orders of the defined types were placed in this encounter.   Patient Instructions  Medication Instructions:  Your Physician recommend you continue on your current medication as directed.    *If you need a refill on your cardiac medications before your next appointment, please call your pharmacy*   Lab Work: None   Testing/Procedures: None   Follow-Up: At [facility], you and your health needs are our priority.  As part of our continuing mission to provide you with exceptional heart care, we have created designated Provider Care Teams.  These Care Teams include your primary Cardiologist (physician) and Advanced Practice Providers (APPs -  Physician Assistants and Nurse Practitioners) who all work together to provide you with the care you need, when you need it.  We recommend signing up for the patient portal called "MyChart".  Sign up information is provided on this After Visit Summary.  MyChart is used to connect with patients for Virtual Visits (Telemedicine).  Patients are able  to view lab/test results, encounter notes, upcoming appointments, etc.  Non-urgent messages can be sent to your provider as well.   To learn more about what you can do with  MyChart, go to [email or web address].    Your next appointment:   6 month(s)  The format for your next appointment:   In Person  Provider:   Buford Dresser, MD      Signed, Buford Dresser, MD PhD 08/31/2019  [place]

## 2019-08-31 NOTE — Patient Instructions (Signed)

## 2019-11-17 ENCOUNTER — Encounter: Payer: Self-pay | Admitting: Cardiology

## 2019-12-01 ENCOUNTER — Other Ambulatory Visit: Payer: Self-pay | Admitting: *Deleted

## 2019-12-01 MED ORDER — MONTELUKAST SODIUM 10 MG PO TABS: 10.0000 mg | ORAL_TABLET | Freq: Every day | ORAL | 1 refills | Status: DC

## 2019-12-07 ENCOUNTER — Ambulatory Visit (INDEPENDENT_AMBULATORY_CARE_PROVIDER_SITE_OTHER): Payer: Medicare Other | Admitting: Primary Care

## 2019-12-07 ENCOUNTER — Encounter: Payer: Self-pay | Admitting: Primary Care

## 2019-12-07 ENCOUNTER — Other Ambulatory Visit: Payer: Self-pay

## 2019-12-07 DIAGNOSIS — J4520 Mild intermittent asthma, uncomplicated: Secondary | ICD-10-CM | POA: Diagnosis not present

## 2019-12-07 MED ORDER — ALBUTEROL SULFATE HFA 108 (90 BASE) MCG/ACT IN AERS: 2.0000 | INHALATION_SPRAY | Freq: Four times a day (QID) | RESPIRATORY_TRACT | 3 refills | Status: DC | PRN

## 2019-12-07 NOTE — Patient Instructions (Addendum)
Recommendations: - Use Symbicort 160 two puffs TWICE day (set a reminder to help) - Continue Singulair 10mg  at bedtime  - Continue Albuterol 2 puffs every 4-6 hours for breakthrough shortness (sent new RX to pharmacy) - Add Claritin/Zyrtec or Xyzal once daily in am (over the counter) - Continue Trazodone and benadry 25mg  at bedtime  - Let us know if you need a letter for medical necessity for booster shot   Follow-up: - 6 months with Dr. Valeta Harms

## 2019-12-07 NOTE — Progress Notes (Signed)
@Patient  ID: Allen Tapia, male    DOB: [birth year], 64 y.o.   MRN: [id number]  Chief Complaint  Patient presents with  . Follow-up    Pt states he has been doing okay since last visit. Pt stated he tested positive for Covid 03/2019 and states his symptoms were mild.     Referring provider: Derinda Late, MD  HPI: 64 year old male, former smoker quit 2008 (20 pack year hx). PMH significant for mild intermittent asthma, MS, barrett's esophagus, CAD, tobacco abuse. Patient of Dr. Valeta Harms, last seen on 03/26/19. Maintained on Symbicort 160, decreased to 1 puff twice daily with spacer. Continue Montelukast, if symptoms continues consider additional of daily antihistamine.   Previous [facility] pulmonary encounters:  Patient complains of cough for the past several weeks. He has episodic events since then. He took a group of teenagers to the beach and got sick for a few days with URI symptoms the cough has been persistent. Former smoker, quit 20 years ago, smoked for 20 years, for about 1 ppd. He had pulmonary function tests in 2009. 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 He notices that his cough is worse when its cold. He has had allergies as a child. He had significant allergies as a kid. Never hospitalized as a child for respiratory symptoms. He does have a dry mouth. He does take trazadone and benadryl at bedtime. No eczema. He was dx with MS, dx in 2012. His gait has been the most difficult. He a swallow study 5 years ago. He has had episodes of cough/choking recently.   OV 12/30/2018: Doing well since last office visit.  He used Symbicort for a short period of time.  His symptoms abated.  He did well throughout the summer with no issues.  This fall noticed with the change in weather developed chest tightness with wheezing again.  Had an exacerbation a few weeks ago.  He was called to set up his PFT appointment at this time noticed that he was still having much difficulty breathing.  He did  restart his Symbicort samples that he was given in the past.  After about a week of Symbicort his symptoms significantly improved no longer had dyspnea on exertion shortness of breath chest tightness or wheezing.  At this time he is doing much better understands that he needs to be on medication daily.  He has restarted it does need refills of this medication.  OV 03/26/2019: Patient here today for follow-up from her's symptoms of cough and shortness of breath.  He was restarted on Symbicort.  His symptoms have completely dissipated.  No longer having chest tightness no longer having wheezing.  His cough is also gone.  He is doing much better.  Using his inhalers regularly with spacer.  He has occasionally noticed some hoarseness of voice.  Overall respiratory symptoms are better.  Denies hemoptysis shortness of breath chest pain chest tightness or wheezing.  12/07/2019 Patient presents today for 6-8 month follow-up/ mild intermittent asthma. He had a mild case of Covid-19 in January 2021. No acute complaints today. He is complaints with Symbicort and Singulair.   His appoint was delays s/t scheduled with covid. When he feels well he does not have nay wheezing. He recently noticed increased wheezing symptoms 3-4 weeks ago. No significant cough. He is currently doing some home renovations outside including power washing. He is moving grass.  He is currently back on 2 puffs Symbicort 160 twice a day. He has missed  an occasional evening dose a couple times a week. Using rescue inhaler mostly twice a day. He is compliant with Singulair. He has slight cough with little mucus production. Planning [place] trip in December. He has had both covid vaccines and is hopping to get boost shot.   No Known Allergies  Immunization History  Administered Date(s) Administered  . Influenza,inj,Quad PF,6+ Mos 01/18/2017, 01/21/2018, 01/09/2019  . Influenza-Unspecified 12/24/2018  . PFIZER SARS-COV-2 Vaccination  06/15/2019, 07/13/2019  . Pneumococcal Conjugate-13 01/21/2018  . Pneumococcal Polysaccharide-23 07/23/2013, 01/29/2019  . Td 09/23/2009    Past Medical History:  Diagnosis Date  . Atypical chest pain   . Barrett's esophagus   . Bilateral cataracts   . Chicken pox   . Chronic constipation   . Coronary artery disease   . Foreign body (FB) in soft tissue   . GERD (gastroesophageal reflux disease)   . Hyperlipidemia   . MS (multiple sclerosis) ([place])   . Shingles     Tobacco History: Social History   Tobacco Use  Smoking Status Former Smoker  . Packs/day: 1.00  . Years: 20.00  . Pack years: 20.00  . Types: Cigarettes  . Quit date: 09/23/2006  . Years since quitting: 13.2  Smokeless Tobacco Never Used   Counseling given: Not Answered   Outpatient Medications Prior to Visit  Medication Sig Dispense Refill  . acetaminophen (TYLENOL) 500 MG tablet Take 1,000 mg by mouth every 6 (six) hours as needed.    Marland Kitchen amphetamine-dextroamphetamine (ADDERALL XR) 25 MG 24 hr capsule Take 25 mg by mouth every morning.    Marland Kitchen aspirin EC 81 MG tablet Take 81 mg by mouth daily.    . baclofen (LIORESAL) 20 MG tablet Take 20 mg by mouth 3 (three) times daily.    . budesonide-formoterol (SYMBICORT) 160-4.5 MCG/ACT inhaler Inhale 2 puffs into the lungs 2 (two) times daily.    Marland Kitchen buPROPion (WELLBUTRIN XL) 150 MG 24 hr tablet     . cholecalciferol (VITAMIN D) 1000 units tablet Take 1,000 Units by mouth daily. 2 capsules daily (2,000 units)    . diphenhydrAMINE (BENADRYL) 25 MG tablet Take 25 mg by mouth at bedtime.    . Fingolimod HCl (GILENYA) 0.5 MG CAPS Take by mouth every morning.    . fluorouracil (EFUDEX) 5 % cream APPLY EXTERNALLY TO THE AFFECTED AREA TWICE DAILY    . gabapentin (NEURONTIN) 300 MG capsule     . montelukast (SINGULAIR) 10 MG tablet Take 1 tablet (10 mg total) by mouth at bedtime. 90 tablet 1  . omeprazole (PRILOSEC) 40 MG capsule Take 40 mg by mouth daily.    . rosuvastatin  (CRESTOR) 20 MG tablet Take 1 tablet (20 mg total) by mouth daily. 90 tablet 3  . Spacer/Aero-Holding Chambers (AEROCHAMBER MV) inhaler Use as instructed 1 each 0  . tamsulosin (FLOMAX) 0.4 MG CAPS capsule Take 1 capsule (0.4 mg total) by mouth daily. 90 capsule 3  . traZODone (DESYREL) 100 MG tablet Take 200 mg by mouth at bedtime.    Marland Kitchen albuterol (VENTOLIN HFA) 108 (90 Base) MCG/ACT inhaler Inhale 2 puffs into the lungs as needed for wheezing or shortness of breath.    . amphetamine-dextroamphetamine (ADDERALL XR) 25 MG 24 hr capsule Take by mouth.    . budesonide-formoterol (SYMBICORT) 160-4.5 MCG/ACT inhaler Inhale 2 puffs into the lungs every 12 (twelve) hours. 3 Inhaler 3  . SHINGRIX injection inject 0.5 milliliter intramuscularly  0  . sildenafil (REVATIO) 20 MG tablet Take  1 tablet (20 mg total) by mouth as needed. 30 tablet 11   No facility-administered medications prior to visit.     Review of Systems  Review of Systems  Constitutional: Negative.   Respiratory: Positive for wheezing. Negative for cough.   Cardiovascular: Negative.      Physical Exam  BP 122/78 (BP Location: Right Arm, Cuff Size: Normal)   Pulse 66   Temp 98.1 F (36.7 C) (Other (Comment)) Comment (Src): wrist  Ht 6\' 2"  (1.88 m)   Wt 197 lb 12.8 oz (89.7 kg)   SpO2 97%   BMI 25.40 kg/m  Physical Exam Constitutional:      Appearance: Normal appearance.  HENT:     Head: Normocephalic and atraumatic.     Mouth/Throat:     Mouth: Mucous membranes are moist.     Pharynx: Oropharynx is clear.  Cardiovascular:     Rate and Rhythm: Normal rate and regular rhythm.  Pulmonary:     Effort: Pulmonary effort is normal.     Breath sounds: Normal breath sounds.     Comments: CTA Skin:    General: Skin is warm and dry.  Neurological:     General: No focal deficit present.     Mental Status: He is alert and oriented to person, place, and time. Mental status is at baseline.  Psychiatric:        Mood and  Affect: Mood normal.        Behavior: Behavior normal.        Thought Content: Thought content normal.        Judgment: Judgment normal.      Lab Results:  CBC    Component Value Date/Time   WBC 3.9 08/23/2016 0848   RBC 4.86 08/23/2016 0848   HGB 14.9 08/23/2016 0848   HCT 44.1 08/23/2016 0848   PLT 215 08/23/2016 0848   MCV 90.8 08/23/2016 0848   MCH 30.7 08/23/2016 0848   MCHC 33.8 08/23/2016 0848   RDW 14.2 08/23/2016 0848   LYMPHSABS 0.1 (L) 08/23/2016 0848   MONOABS 0.6 08/23/2016 0848   EOSABS 0.2 08/23/2016 0848   BASOSABS 0.0 08/23/2016 0848    BMET No results found for: NA, K, CL, CO2, GLUCOSE, BUN, CREATININE, CALCIUM, GFRNONAA, GFRAA  BNP No results found for: BNP  ProBNP No results found for: PROBNP  Imaging: No results found.   Assessment & Plan:   Mild intermittent asthma - Fairly well controlled on high dose ICS/LABA Reports increased wheezing when working outside for the last 3-4 weeks. Using SABA 1-2 times a day. No signs of acute exacerbation today in office.  - Continue Symbicort 160, encourage patient use 2 puffs TWICE DAILY (set alarm as reminder) - Continue Singulair 10mg  at bedtime - Adding daily antihistamine      Martyn Ehrich, NP 12/07/2019

## 2019-12-07 NOTE — Assessment & Plan Note (Signed)
-   Fairly well controlled on high dose ICS/LABA Reports increased wheezing when working outside for the last 3-4 weeks. Using SABA 1-2 times a day. No signs of acute exacerbation today in office.  - Continue Symbicort 160, encourage patient use 2 puffs TWICE DAILY (set alarm as reminder) - Continue Singulair 10mg  at bedtime - Adding daily antihistamine

## 2019-12-08 MED ORDER — ALBUTEROL SULFATE HFA 108 (90 BASE) MCG/ACT IN AERS: 2.0000 | INHALATION_SPRAY | Freq: Four times a day (QID) | RESPIRATORY_TRACT | 3 refills | Status: DC | PRN

## 2019-12-23 ENCOUNTER — Ambulatory Visit: Payer: Medicare Other | Admitting: Urology

## 2019-12-31 NOTE — Progress Notes (Signed)
Thanks for seeing him Garner Nash, DO  Pulmonary Critical Care 12/31/2019 12:16 AM

## 2020-01-06 ENCOUNTER — Ambulatory Visit (INDEPENDENT_AMBULATORY_CARE_PROVIDER_SITE_OTHER): Payer: Medicare Other | Admitting: Podiatry

## 2020-01-06 ENCOUNTER — Other Ambulatory Visit: Payer: Self-pay

## 2020-01-06 ENCOUNTER — Encounter: Payer: Self-pay | Admitting: Podiatry

## 2020-01-06 DIAGNOSIS — I2510 Atherosclerotic heart disease of native coronary artery without angina pectoris: Secondary | ICD-10-CM

## 2020-01-06 DIAGNOSIS — M7751 Other enthesopathy of right foot: Secondary | ICD-10-CM

## 2020-01-06 NOTE — Progress Notes (Signed)
He presents today for follow-up of his capsulitis in his history with MS.  He states that that there have been no new changes and that his neurologist did not want to change his medicine to Lyrica.  He states that he has not fallen recently.  He states that his second toe seems to grab and tripped him as he refers to a long second digit of the right foot.  Objective: Vital signs are stable he is alert oriented x3 muscle strength is peers to be normal and symmetrical bilateral with possibly very slight loss of extensor function right side.  Maybe a little bit weaker than the left.  Otherwise pulses are strong with palpable there is no signs of infection.  No open lesions or wounds the distal aspect of his second toe right foot does demonstrate a mild mallet toe deformity I feel that if we could release some of that I think the reactive hyperkeratosis in that area will resolve and may quit tripping him.  He does not have a lot of extensor function so I do think that more than likely his flexor function is overpowering the extensors.  Assessment: MS with neuropathy.  Mild hammertoe deformity right.  Plan: I will follow-up with him in a few weeks at which time we will do a flexor tenotomy this will be after he gets back from [place] most likely in January.  We did not inject his ankle today.

## 2020-01-21 ENCOUNTER — Encounter: Payer: Self-pay | Admitting: Urology

## 2020-01-21 ENCOUNTER — Ambulatory Visit (INDEPENDENT_AMBULATORY_CARE_PROVIDER_SITE_OTHER): Payer: Medicare Other | Admitting: Urology

## 2020-01-21 ENCOUNTER — Other Ambulatory Visit: Payer: Self-pay

## 2020-01-21 VITALS — BP 123/71 | HR 71 | Ht 74.0 in | Wt 197.0 lb

## 2020-01-21 DIAGNOSIS — N401 Enlarged prostate with lower urinary tract symptoms: Secondary | ICD-10-CM | POA: Diagnosis not present

## 2020-01-21 DIAGNOSIS — N529 Male erectile dysfunction, unspecified: Secondary | ICD-10-CM

## 2020-01-21 DIAGNOSIS — N3281 Overactive bladder: Secondary | ICD-10-CM

## 2020-01-21 DIAGNOSIS — N138 Other obstructive and reflux uropathy: Secondary | ICD-10-CM | POA: Diagnosis not present

## 2020-01-21 NOTE — Patient Instructions (Addendum)
Cystoscopy Cystoscopy is a procedure that is used to help diagnose and sometimes treat conditions that affect the lower urinary tract. The lower urinary tract includes the bladder and the urethra. The urethra is the tube that drains urine from the bladder. Cystoscopy is done using a thin, tube-shaped instrument with a light and camera at the end (cystoscope). The cystoscope may be hard or flexible, depending on the goal of the procedure. The cystoscope is inserted through the urethra, into the bladder. Cystoscopy may be recommended if you have:  Urinary tract infections that keep coming back.  Blood in the urine (hematuria).  An inability to control when you urinate (urinary incontinence) or an overactive bladder.  Unusual cells found in a urine sample.  A blockage in the urethra, such as a urinary stone.  Painful urination.  An abnormality in the bladder found during an intravenous pyelogram (IVP) or CT scan. Cystoscopy may also be done to remove a sample of tissue to be examined under a microscope (biopsy). What are the risks? Generally, this is a safe procedure. However, problems may occur, including:  Infection.  Bleeding.  What happens during the procedure?  1. You will be given one or more of the following: ? A medicine to numb the area (local anesthetic). 2. The area around the opening of your urethra will be cleaned. 3. The cystoscope will be passed through your urethra into your bladder. 4. Germ-free (sterile) fluid will flow through the cystoscope to fill your bladder. The fluid will stretch your bladder so that your health care provider can clearly examine your bladder walls. 5. Your doctor will look at the urethra and bladder. 6. The cystoscope will be removed The procedure may vary among health care providers  What can I expect after the procedure? After the procedure, it is common to have: 1. Some soreness or pain in your abdomen and urethra. 2. Urinary symptoms.  These include: ? Mild pain or burning when you urinate. Pain should stop within a few minutes after you urinate. This may last for up to 1 week. ? A small amount of blood in your urine for several days. ? Feeling like you need to urinate but producing only a small amount of urine. Follow these instructions at home: General instructions  Return to your normal activities as told by your health care provider.   Do not drive for 24 hours if you were given a sedative during your procedure.  Watch for any blood in your urine. If the amount of blood in your urine increases, call your health care provider.  If a tissue sample was removed for testing (biopsy) during your procedure, it is up to you to get your test results. Ask your health care provider, or the department that is doing the test, when your results will be ready.  Drink enough fluid to keep your urine pale yellow.  Keep all follow-up visits as told by your health care provider. This is important. Contact a health care provider if you:  Have pain that gets worse or does not get better with medicine, especially pain when you urinate.  Have trouble urinating.  Have more blood in your urine. Get help right away if you:  Have blood clots in your urine.  Have abdominal pain.  Have a fever or chills.  Are unable to urinate. Summary  Cystoscopy is a procedure that is used to help diagnose and sometimes treat conditions that affect the lower urinary tract.  Cystoscopy is done using   a thin, tube-shaped instrument with a light and camera at the end.  After the procedure, it is common to have some soreness or pain in your abdomen and urethra.  Watch for any blood in your urine. If the amount of blood in your urine increases, call your health care provider.  If you were prescribed an antibiotic medicine, take it as told by your health care provider. Do not stop taking the antibiotic even if you start to feel better. This  information is not intended to replace advice given to you by your health care provider. Make sure you discuss any questions you have with your health care provider. Document Revised: 03/04/2018 Document Reviewed: 03/04/2018 Elsevier Patient Education  2020 [employer]    Holmium Laser Enucleation of the Prostate (HoLEP)  HoLEP is a treatment for men with benign prostatic hyperplasia (BPH). The laser surgery removed blockages of urine flow, and is done without any incisions on the body.     What is HoLEP?  HoLEP is a type of laser surgery used to treat obstruction (blockage) of urine flow as a result of benign prostatic hyperplasia (BPH). In men with BPH, the prostate gland is not cancerous, but has become enlarged. An enlarged prostate can result in a number of urinary tract symptoms such as weak urinary stream, difficulty in starting urination, inability to urinate, frequent urination, or getting up at night to urinate.  HoLEP was developed in the 1990's as a more effective and less expensive surgical option for BPH, compared to other surgical options such as laser vaporization(PVP/greenlight laser), transurethral resection of the prostate(TURP), and open simple prostatectomy.   What happens during a HoLEP?  HoLEP requires general anesthesia ("asleep" throughout the procedure).   An antibiotic is given to reduce the risk of infection  A surgical instrument called a resectoscope is inserted through the urethra (the tube that carries urine from the bladder). The resectoscope has a camera that allows the surgeon to view the internal structure of the prostate gland, and to see where the incisions are being made during surgery.  The laser is inserted into the resectoscope and is used to enucleate (free up) the enlarged prostate tissue from the capsule (outer shell) and then to seal up any blood vessels. The tissue that has been removed is pushed back into the bladder.  A morcellator is  placed through the resectoscope, and is used to suction out the prostate tissue that has been pushed into the bladder.  When the prostate tissue has been removed, the resectoscope is removed, and a foley catheter is placed to allow healing and drain the urine from the bladder.     What happens after a HoLEP?  More than 90% of patients go home the same day a few hours after surgery. Less than 10% will be admitted to the hospital overnight for observation to monitor the urine, or if they have other medical problems.  Fluid is flushed through the catheter for about 1 hour after surgery to clear any blood from the urine. It is normal to have some blood in the urine after surgery. The need for blood transfusion is extremely rare.  Eating and drinking are permitted after the procedure once the patient has fully awakened from anesthesia.  The catheter is usually removed 2-3 days after surgery- the patient will come to clinic to have the catheter removed and make sure they can urinate on their own.  It is very important to drink lots of fluids after surgery   for one week to keep the bladder flushed.  At first, there may be some burning with urination, but this typically improved within a few hours to days. Most patients do not have a significant amount of pain, and narcotic pain medications are rarely needed.  Symptoms of urinary frequency, urgency, and even leakage are NORMAL for the first few weeks after surgery as the bladder adjusts after having to work hard against blockage from the prostate for many years. This will improve, but can sometimes take several months.  The use of pelvic floor exercises (Kegel exercises) can help improve problems with urinary incontinence.   After catheter removal, patients will be seen at 6 weeks and 6 months for symptom check  No heavy lifting for at least 2-3 weeks after surgery, however patients can walk and do light activities the first day after surgery.  Return to work time depends on occupation.    What are the advantages of HoLEP?  HoLEP has been studied in many different parts of the world and has been shown to be a safe and effective procedure. Although there are many types of BPH surgeries available, HoLEP offers a unique advantage in being able to remove a large amount of tissue without any incisions on the body, even in very large prostates, while decreasing the risk of bleeding and providing tissue for pathology (to look for cancer). This decreases the need for blood transfusions during surgery, minimizes hospital stay, and reduces the risk of needing repeat treatment.  What are the side effects of HoLEP?  Temporary burning and bleeding during urination. Some blood may be seen in the urine for weeks after surgery and is part of the healing process.  Urinary incontinence (inability to control urine flow) is expected in all patients immediately after surgery and they should wear pads for the first few days/weeks. This typically improves over the course of several weeks. Performing Kegel exercises can help decrease leakage from stress maneuvers such as coughing, sneezing, or lifting. The rate of long term leakage is very low. Patients may also have leakage with urgency and this may be treated with medication. The risk of urge incontinence can be dependent on several factors including age, prostate size, symptoms, and other medical problems.  Retrograde ejaculation or "backwards ejaculation." In 75% of cases, the patient will not see any fluid during ejaculation after surgery.  Erectile function is generally not significantly affected.   What are the risks of HoLEP?  Injury to the urethra or development of scar tissue at a later date  Injury to the capsule of the prostate (typically treated with longer catheterization).  Injury to the bladder or ureteral orifices (where the urine from the kidney drains out)  Infection of the bladder,  testes, or kidneys  Return of urinary obstruction at a later date requiring another operation (<2%)  Need for blood transfusion or re-operation due to bleeding  Failure to relieve all symptoms and/or need for prolonged catheterization after surgery  5-15% of patients are found to have previously undiagnosed prostate cancer in their specimen. Prostate cancer can be treated after HoLEP.  Standard risks of anesthesia including blood clots, heart attacks, etc  When should I call my doctor?  Fever over 101.3 degrees  Inability to urinate, or large blood clots in the urine   

## 2020-01-21 NOTE — Progress Notes (Signed)
   01/21/2020 9:49 AM   Allen Tapia [birth year] [id number]  Reason for visit: Follow up BPH/OAB, ED  HPI: I saw [name] back in urology clinic for the above issues.  Briefly I originally saw him in October 2020 for severe urinary frequency with 25-30 voids per day, nocturia 1-3 times per night.  His history is notable for multiple sclerosis long-term that has been very well managed.  He was drinking a high volume of diet sodas and cut those back, as well as some pelvic floor physical therapy with significant improvement in his urination.  He also tried Myrbetriq with minimal improvement.  PVRs have been persistently mildly elevated around 200 mL, and remains elevated at 175 mL today.  He previously underwent urodynamics that showed max capacity of 470 mL, for sensation at 330 mL, normal desire 368 mL, and strong desire 397 mL.  The bladder was unstable, and first unstable contraction was at 349 mL.  He could sense the unstable contractions but was able to inhibit any leaking.  There was no stress incontinence.  He voided with a prolonged, interrupted, and obstructed flow pattern with significant intermittency.  Max flow was 7 mL/S with an average flow of 1.2 mL/s, with max detrusor pressure of 57 cmH2O, and PVR of 115 mL.  This was consistent with obstruction.  He notices significant improvement on Flomax and was very pleased with his urinary symptoms.  There is no prior cystoscopy or evaluation of prostate volume.  Over the last year, he does notice worsening of his urinary symptoms despite Flomax with continued weak stream and urinary frequency.  IPSS score today is 19, with quality of life mixed.  His primary complaint is weak stream and ongoing urinary frequency during the day.  He is interested in other options for his urinary symptoms outside of Flomax at this time.  We had a very long conversation about the pros and cons of UroLift and HOLEP, and that cystoscopy/TRUS as needed prior to  determining an optimal outlet procedure.  He is in agreement with proceeding with cystoscopy/TRUS, and likely outlet procedure in the future.  With his MS and overactive symptoms, UroLift may be the best option, but we discussed the importance of further evaluation with cystoscopy/TRUS.  PSA has been normal, most recently 0.17 in May 2021.  Finally, regarding his ED, he had no improvement on Cialis 20 mg, and at her last visit I recommended a trial of Revatio 20 to 100 mg on demand.  It sounds like he was only taking 1 tab at a time and noticed some improvement in his erections, but not enough for penetration.  He is able to achieve orgasm.  He also had side effects of stuffy nose with Revatio.  Okay to take up to 100 mg of Revatio on demand Continue Flomax Follow-up for cystoscopy/TRUS and discuss outlet procedures for worsening BPH  Billey Co, MD  [facility] [address], [place], [place] [address] [phone]

## 2020-02-11 ENCOUNTER — Encounter: Payer: Self-pay | Admitting: Urology

## 2020-02-25 ENCOUNTER — Encounter: Payer: Medicare Other | Admitting: Urology

## 2020-03-01 ENCOUNTER — Ambulatory Visit: Payer: Medicare Other | Admitting: Cardiology

## 2020-03-09 ENCOUNTER — Other Ambulatory Visit: Payer: Self-pay

## 2020-03-09 ENCOUNTER — Encounter: Payer: Self-pay | Admitting: Urology

## 2020-03-09 ENCOUNTER — Ambulatory Visit (INDEPENDENT_AMBULATORY_CARE_PROVIDER_SITE_OTHER): Payer: Medicare Other | Admitting: Urology

## 2020-03-09 VITALS — BP 166/73 | HR 70 | Ht 74.0 in | Wt 197.0 lb

## 2020-03-09 DIAGNOSIS — N401 Enlarged prostate with lower urinary tract symptoms: Secondary | ICD-10-CM

## 2020-03-09 DIAGNOSIS — N3281 Overactive bladder: Secondary | ICD-10-CM

## 2020-03-09 DIAGNOSIS — N138 Other obstructive and reflux uropathy: Secondary | ICD-10-CM | POA: Diagnosis not present

## 2020-03-09 NOTE — Progress Notes (Signed)
Cystoscopy/TRUS Procedure Note:  Indication: BPH/OAB symptoms.  After informed consent and discussion of the procedure and its risks, Allen Tapia was positioned and prepped in the standard fashion. Cystoscopy was performed with a flexible cystoscope. The urethra, bladder neck and entire bladder was visualized in a standard fashion. The prostate was moderate in size with obstructing lateral lobes.  No median lobe. The ureteral orifices were visualized in their normal location and orientation.  Mild bladder trabeculations throughout, no suspicious lesions.  No abnormalities on retroflexion  Imaging: The transrectal ultrasound probe was inserted into the rectum and measurements were taken to calculated volume of 33 g  Findings: 33 g prostate with obstructing lateral lobes, high bladder neck ----------------------------------------------------------  Assessment and Plan: Briefly, 64 year old male with history of multiple sclerosis long-term this has been well managed.  He had a number of urinary symptoms including weak stream and urinary frequency.  IPSS score 19 with quality of life mixed.  Noticed some improvement on Flomax, but symptoms have worsened over the last year.  History of urodynamics that showed some unstable contractions and an obstructed flow pattern with max flow of 7 mL/s with an average flow of 1.2 mL/s and max detrusor pressure of 57 cmH2O.  He has had mildly elevated PVRs ranging around 200 mL in the past.  We had a long conversation about the differences between HOLEP and UroLift, and the risks and benefits.  We discussed that HOLEP removes a large amount of tissue, and has a longer recovery time with a low risk of long-term incontinence.  I would be concerned with his history of MS that if his MS acutely worsened he could have worsening frequency reviewed incontinence.  I think a better option for him is UroLift which is least invasive and will likely improve his frequency and  weak stream.  We also discussed that in the future if he had worsening obstructive symptoms could consider HOLEP if symptoms are refractory to UroLift.  He would like to pursue UroLift which I think is very reasonable.  Schedule UroLift in late January per patient request  Nickolas Madrid, MD 03/09/2020

## 2020-03-10 ENCOUNTER — Encounter: Payer: Self-pay | Admitting: Urology

## 2020-03-15 ENCOUNTER — Telehealth: Payer: Self-pay | Admitting: Primary Care

## 2020-03-15 MED ORDER — BUDESONIDE-FORMOTEROL FUMARATE 160-4.5 MCG/ACT IN AERO: 2.0000 | INHALATION_SPRAY | Freq: Two times a day (BID) | RESPIRATORY_TRACT | 3 refills | Status: DC

## 2020-03-15 NOTE — Telephone Encounter (Signed)
Called and spoke with patient to verify medication he needs refilled and which pharmacy. He verified both. Refill has been sent in. Nothing further needed at this time.

## 2020-04-05 ENCOUNTER — Other Ambulatory Visit: Payer: Self-pay | Admitting: Cardiology

## 2020-04-05 DIAGNOSIS — I2510 Atherosclerotic heart disease of native coronary artery without angina pectoris: Secondary | ICD-10-CM

## 2020-04-15 ENCOUNTER — Ambulatory Visit: Payer: Medicare Other | Admitting: Cardiology

## 2020-04-20 ENCOUNTER — Ambulatory Visit: Payer: Medicare Other | Admitting: Podiatry

## 2020-04-27 ENCOUNTER — Other Ambulatory Visit
Admission: RE | Admit: 2020-04-27 | Discharge: 2020-04-27 | Disposition: A | Discharge status: Home / Self Care | Payer: Medicare Other | Source: Ambulatory Visit | Attending: Gastroenterology | Admitting: Gastroenterology

## 2020-04-27 ENCOUNTER — Other Ambulatory Visit: Payer: Self-pay

## 2020-04-27 DIAGNOSIS — Z01812 Encounter for preprocedural laboratory examination: Secondary | ICD-10-CM | POA: Insufficient documentation

## 2020-04-27 DIAGNOSIS — Z20822 Contact with and (suspected) exposure to covid-19: Secondary | ICD-10-CM | POA: Diagnosis not present

## 2020-04-27 LAB — SARS CORONAVIRUS 2 (TAT 6-24 HRS): SARS Coronavirus 2: NEGATIVE

## 2020-04-28 ENCOUNTER — Encounter: Payer: Self-pay | Admitting: *Deleted

## 2020-04-29 ENCOUNTER — Other Ambulatory Visit: Payer: Self-pay | Admitting: Urology

## 2020-04-29 ENCOUNTER — Encounter
Admission: RE | Disposition: A | Discharge status: Home / Self Care | Payer: Self-pay | Source: Home / Self Care | Attending: Gastroenterology

## 2020-04-29 ENCOUNTER — Ambulatory Visit
Admission: RE | Admit: 2020-04-29 | Discharge: 2020-04-29 | Disposition: A | Discharge status: Home / Self Care | Payer: Medicare Other | Attending: Gastroenterology | Admitting: Gastroenterology

## 2020-04-29 ENCOUNTER — Ambulatory Visit: Payer: Medicare Other | Admitting: Anesthesiology

## 2020-04-29 ENCOUNTER — Encounter: Payer: Self-pay | Admitting: *Deleted

## 2020-04-29 ENCOUNTER — Other Ambulatory Visit: Payer: Self-pay

## 2020-04-29 DIAGNOSIS — Z1211 Encounter for screening for malignant neoplasm of colon: Secondary | ICD-10-CM | POA: Insufficient documentation

## 2020-04-29 DIAGNOSIS — K640 First degree hemorrhoids: Secondary | ICD-10-CM | POA: Insufficient documentation

## 2020-04-29 DIAGNOSIS — K5730 Diverticulosis of large intestine without perforation or abscess without bleeding: Secondary | ICD-10-CM | POA: Insufficient documentation

## 2020-04-29 DIAGNOSIS — D124 Benign neoplasm of descending colon: Secondary | ICD-10-CM | POA: Insufficient documentation

## 2020-04-29 DIAGNOSIS — Z86010 Personal history of colonic polyps: Secondary | ICD-10-CM | POA: Diagnosis not present

## 2020-04-29 DIAGNOSIS — Z8616 Personal history of COVID-19: Secondary | ICD-10-CM | POA: Diagnosis not present

## 2020-04-29 DIAGNOSIS — N138 Other obstructive and reflux uropathy: Secondary | ICD-10-CM

## 2020-04-29 DIAGNOSIS — Z7951 Long term (current) use of inhaled steroids: Secondary | ICD-10-CM | POA: Insufficient documentation

## 2020-04-29 DIAGNOSIS — Z7982 Long term (current) use of aspirin: Secondary | ICD-10-CM | POA: Insufficient documentation

## 2020-04-29 DIAGNOSIS — Z79899 Other long term (current) drug therapy: Secondary | ICD-10-CM | POA: Diagnosis not present

## 2020-04-29 DIAGNOSIS — N401 Enlarged prostate with lower urinary tract symptoms: Secondary | ICD-10-CM

## 2020-04-29 DIAGNOSIS — K219 Gastro-esophageal reflux disease without esophagitis: Secondary | ICD-10-CM | POA: Insufficient documentation

## 2020-04-29 HISTORY — PX: ESOPHAGOGASTRODUODENOSCOPY (EGD) WITH PROPOFOL: SHX5813

## 2020-04-29 HISTORY — DX: Polyp of colon: K63.5

## 2020-04-29 HISTORY — PX: COLONOSCOPY WITH PROPOFOL: SHX5780

## 2020-04-29 HISTORY — DX: Unspecified asthma, uncomplicated: J45.909

## 2020-04-29 HISTORY — DX: Personal history of nicotine dependence: Z87.891

## 2020-04-29 SURGERY — COLONOSCOPY WITH PROPOFOL
Anesthesia: General

## 2020-04-29 MED ORDER — LIDOCAINE HCL (PF) 2 % IJ SOLN
INTRAMUSCULAR | Status: DC | PRN
  Administered 2020-04-29: 50 mg

## 2020-04-29 MED ORDER — PROPOFOL 10 MG/ML IV BOLUS
INTRAVENOUS | Status: DC | PRN
  Administered 2020-04-29 (×2): 20 mg via INTRAVENOUS

## 2020-04-29 MED ORDER — PROPOFOL 500 MG/50ML IV EMUL
INTRAVENOUS | Status: AC
  Filled 2020-04-29: qty 50

## 2020-04-29 MED ORDER — PROPOFOL 500 MG/50ML IV EMUL
INTRAVENOUS | Status: DC | PRN
  Administered 2020-04-29: 50 ug/kg/min via INTRAVENOUS

## 2020-04-29 MED ORDER — MIDAZOLAM HCL 5 MG/5ML IJ SOLN
INTRAMUSCULAR | Status: DC | PRN
  Administered 2020-04-29: 2 mg via INTRAVENOUS

## 2020-04-29 MED ORDER — FENTANYL CITRATE (PF) 100 MCG/2ML IJ SOLN
INTRAMUSCULAR | Status: AC
  Filled 2020-04-29: qty 2

## 2020-04-29 MED ORDER — MIDAZOLAM HCL 2 MG/2ML IJ SOLN
INTRAMUSCULAR | Status: AC
  Filled 2020-04-29: qty 2

## 2020-04-29 MED ORDER — FENTANYL CITRATE (PF) 100 MCG/2ML IJ SOLN
INTRAMUSCULAR | Status: DC | PRN
  Administered 2020-04-29: 25 ug via INTRAVENOUS
  Administered 2020-04-29: 50 ug via INTRAVENOUS
  Administered 2020-04-29: 25 ug via INTRAVENOUS

## 2020-04-29 MED ORDER — SODIUM CHLORIDE 0.9 % IV SOLN: INTRAVENOUS | Status: DC

## 2020-04-29 NOTE — Interval H&P Note (Signed)
History and Physical Interval Note:  04/29/2020 1:20 PM  Costella Hatcher  has presented today for surgery, with the diagnosis of HX ADEN POLYP GERD  DYSPHAGIA.  The various methods of treatment have been discussed with the patient and family. After consideration of risks, benefits and other options for treatment, the patient has consented to  Procedure(s): COLONOSCOPY WITH PROPOFOL (N/A) ESOPHAGOGASTRODUODENOSCOPY (EGD) WITH PROPOFOL (N/A) as a surgical intervention.  The patient's history has been reviewed, patient examined, no change in status, stable for surgery.  I have reviewed the patient's chart and labs.  Questions were answered to the patient's satisfaction.     Lesly Rubenstein  Ok to proceed with EGD/Colonoscopy

## 2020-04-29 NOTE — Op Note (Signed)
[facility] Gastroenterology Patient Name: Allen Tapia Procedure Date: 04/29/2020 1:14 PM MRN: [id number] Account #: [id number] Date of Birth: [birth year] Admit Type: Outpatient Age: 65 Room: [facility] ENDO ROOM 3 Gender: Male Note Status: Finalized Procedure:             Upper GI endoscopy Indications:           Gastro-esophageal reflux disease Providers:             Andrey Farmer MD, MD Referring MD:          Caprice Renshaw MD (Referring MD) Medicines:             Monitored Anesthesia Care Complications:         No immediate complications. Procedure:             Pre-Anesthesia Assessment:                        - Prior to the procedure, a History and Physical was                         performed, and patient medications and allergies were                         reviewed. The patient is competent. The risks and                         benefits of the procedure and the sedation options and                         risks were discussed with the patient. All questions                         were answered and informed consent was obtained.                         Patient identification and proposed procedure were                         verified by the physician, the nurse, the anesthetist                         and the technician in the endoscopy suite. Mental                         Status Examination: alert and oriented. Airway                         Examination: normal oropharyngeal airway and neck                         mobility. Respiratory Examination: clear to                         auscultation. CV Examination: normal. Prophylactic                         Antibiotics: The patient does not require prophylactic  antibiotics. Prior Anticoagulants: The patient has                         taken no previous anticoagulant or antiplatelet                         agents. ASA Grade Assessment: II - A patient with mild                          systemic disease. After reviewing the risks and                         benefits, the patient was deemed in satisfactory                         condition to undergo the procedure. The anesthesia                         plan was to use monitored anesthesia care (MAC).                         Immediately prior to administration of medications,                         the patient was re-assessed for adequacy to receive                         sedatives. The heart rate, respiratory rate, oxygen                         saturations, blood pressure, adequacy of pulmonary                         ventilation, and response to care were monitored                         throughout the procedure. The physical status of the                         patient was re-assessed after the procedure.                        After obtaining informed consent, the endoscope was                         passed under direct vision. Throughout the procedure,                         the patient's blood pressure, pulse, and oxygen                         saturations were monitored continuously. The Endoscope                         was introduced through the mouth, and advanced to the                         second part of duodenum. The upper GI endoscopy was  accomplished without difficulty. The patient tolerated                         the procedure well. Findings:      The esophagus and gastroesophageal junction were examined with white       light and narrow band imaging (NBI). There was no visual evidence of       Barrett's esophagus.      The examined esophagus was normal.      The entire examined stomach was normal.      The examined duodenum was normal. Impression:            - There is no endoscopic evidence of Barrett's                         esophagus.                        - Normal esophagus.                        - Normal stomach.                        - Normal examined  duodenum.                        - No specimens collected. Recommendation:        - Perform a colonoscopy today. Procedure Code(s):     --- Professional ---                        [phone], Esophagogastroduodenoscopy, flexible,                         transoral; diagnostic, including collection of                         specimen(s) by brushing or washing, when performed                         (separate procedure) Diagnosis Code(s):     --- Professional ---                        K21.9, Gastro-esophageal reflux disease without                         esophagitis CPT copyright 2019 American Medical Association. All rights reserved. The codes documented in this report are preliminary and upon coder review may  be revised to meet current compliance requirements. Andrey Farmer MD, MD 04/29/2020 1:58:09 PM Number of Addenda: 0 Note Initiated On: 04/29/2020 1:14 PM Estimated Blood Loss:  Estimated blood loss: none.      [facility]

## 2020-04-29 NOTE — Anesthesia Preprocedure Evaluation (Signed)
Anesthesia Evaluation  Patient identified by MRN, date of birth, ID band Patient awake    Reviewed: Allergy & Precautions, NPO status , Patient's Chart, lab work & pertinent test results, reviewed documented beta blocker date and time   History of Anesthesia Complications Negative for: history of anesthetic complications  Airway Mallampati: II       Dental  (+) Dental Advidsory Given, Caps, Missing, Teeth Intact   Pulmonary neg shortness of breath, asthma , neg sleep apnea, neg COPD, Recent URI  (Had COVID, but resolved), Resolved, former smoker,           Cardiovascular (-) hypertension(-) angina+ CAD  (-) Past MI and (-) Cardiac Stents + dysrhythmias (PVCs) (-) Valvular Problems/Murmurs     Neuro/Psych PSYCHIATRIC DISORDERS Depression negative neurological ROS     GI/Hepatic Neg liver ROS, GERD  Medicated and Controlled,  Endo/Other  negative endocrine ROS  Renal/GU negative Renal ROS     Musculoskeletal   Abdominal   Peds  Hematology negative hematology ROS (+)   Anesthesia Other Findings Past Medical History: No date: Asthma No date: Atypical chest pain No date: Barrett's esophagus No date: Bilateral cataracts No date: Chicken pox No date: Chronic constipation 12/20/2010: Coronary artery disease No date: Foreign body (FB) in soft tissue No date: Former tobacco use No date: GERD (gastroesophageal reflux disease) No date: Hyperlipidemia No date: Hyperplastic colon polyp 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 No date: MS (multiple sclerosis) ([place]) No date: Shingles 08/23/2016: Tubular adenoma of colon   Reproductive/Obstetrics                             Anesthesia Physical  Anesthesia Plan  ASA: II  Anesthesia Plan: General   Post-op Pain Management:    Induction: Intravenous  PONV Risk Score and Plan: 2 and TIVA and Propofol infusion  Airway Management Planned: Nasal  Cannula and Natural Airway  Additional Equipment:   Intra-op Plan:   Post-operative Plan:   Informed Consent: I have reviewed the patients History and Physical, chart, labs and discussed the procedure including the risks, benefits and alternatives for the proposed anesthesia with the patient or authorized representative who has indicated his/her understanding and acceptance.       Plan Discussed with:   Anesthesia Plan Comments:         Anesthesia Quick Evaluation

## 2020-04-29 NOTE — H&P (Signed)
Outpatient short stay form Pre-procedure 04/29/2020 1:17 PM Allen Miyamoto MD, MPH  Primary Physician: Dr. 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 Had a first cousin with esophageal cancer. No blood thinners or abdominal surgeries.    Current Facility-Administered Medications:  .  0.9 %  sodium chloride infusion, , Intravenous, Continuous, Teriana Danker, Hilton Cork, MD, Last Rate: 20 mL/hr at 04/29/20 1312, New Bag at 04/29/20 1312  Medications Prior to Admission  Medication Sig Dispense Refill Last Dose  . acetaminophen (TYLENOL) 500 MG tablet Take 1,000 mg by mouth every 6 (six) hours as needed.   04/29/2020 at 0600  . albuterol (VENTOLIN HFA) 108 (90 Base) MCG/ACT inhaler Inhale 2 puffs into the lungs every 6 (six) hours as needed for wheezing or shortness of breath. 24 g 3 Past Week at Unknown time  . amphetamine-dextroamphetamine (ADDERALL XR) 25 MG 24 hr capsule Take 25 mg by mouth every morning.   04/28/2020 at Unknown time  . aspirin EC 81 MG tablet Take 81 mg by mouth daily.   04/28/2020 at Unknown time  . baclofen (LIORESAL) 20 MG tablet Take 20 mg by mouth 3 (three) times daily.   04/28/2020 at Unknown time  . budesonide-formoterol (SYMBICORT) 160-4.5 MCG/ACT inhaler Inhale 2 puffs into the lungs in the morning and at bedtime. 3 each 3 04/29/2020 at 0600  . buPROPion (WELLBUTRIN XL) 150 MG 24 hr tablet    04/28/2020 at Unknown time  . cholecalciferol (VITAMIN D) 1000 units tablet Take 1,000 Units by mouth daily. 2 capsules daily (2,000 units)   04/28/2020 at Unknown time  . Fingolimod HCl 0.5 MG CAPS Take by mouth every morning.   04/29/2020 at Unknown time  . fluorouracil (EFUDEX) 5 % cream APPLY EXTERNALLY TO THE AFFECTED AREA TWICE DAILY   04/29/2020 at Unknown time  . gabapentin (NEURONTIN) 300 MG capsule Take 300  mg by mouth 2 (two) times daily.    04/29/2020 at Unknown time  . ibuprofen (ADVIL) 800 MG tablet Take 800 mg by mouth every 6 (six) hours as needed.     . montelukast (SINGULAIR) 10 MG tablet Take 1 tablet (10 mg total) by mouth at bedtime. 90 tablet 1 04/28/2020 at Unknown time  . omeprazole (PRILOSEC) 40 MG capsule Take 40 mg by mouth daily.   04/28/2020 at Unknown time  . rosuvastatin (CRESTOR) 20 MG tablet Take 1 tablet (20 mg total) by mouth daily. 90 tablet 3 04/28/2020 at Unknown time  . tamsulosin (FLOMAX) 0.4 MG CAPS capsule TAKE 1 CAPSULE DAILY 90 capsule 1 04/28/2020 at Unknown time  . traZODone (DESYREL) 100 MG tablet Take 200 mg by mouth at bedtime.   04/28/2020 at Unknown time  . diphenhydrAMINE (BENADRYL) 25 MG tablet Take 25 mg by mouth at bedtime.     . sildenafil (REVATIO) 20 MG tablet Take 20 mg by mouth as needed. Take 1-5 tablets by mouth as needed prior to intercourse     . Spacer/Aero-Holding Chambers (AEROCHAMBER MV) inhaler Use as instructed 1 each 0      No Known Allergies   Past Medical History:  Diagnosis Date  . Asthma   . Atypical chest pain   . Barrett's esophagus   . Bilateral cataracts   . Chicken pox   . Chronic constipation   . Coronary artery disease 12/20/2010  .  Foreign body (FB) in soft tissue   . Former tobacco use   . GERD (gastroesophageal reflux disease)   . Hyperlipidemia   . Hyperplastic colon polyp   . Ischemic heart disease 12/20/2010  . MS (multiple sclerosis) ([place])   . Shingles   . Tubular adenoma of colon 08/23/2016    Review of systems:  Otherwise negative.    Physical Exam  Gen: Alert, oriented. Appears stated age.  HEENT: PERRLA. Lungs: No respiratory distress. CV: RRR Abd: soft, benign, no masses Ext: No edema    Planned procedures: Proceed with EGD/colonoscopy. The patient understands the nature of the planned procedure, indications, risks, alternatives and potential complications including but not limited to bleeding,  infection, perforation, damage to internal organs and possible oversedation/side effects from anesthesia. The patient agrees and gives consent to proceed.  Please refer to procedure notes for findings, recommendations and patient disposition/instructions.     Allen Miyamoto MD, MPH Gastroenterology 04/29/2020  1:17 PM

## 2020-04-29 NOTE — Op Note (Signed)
[facility] Gastroenterology Patient Name: Allen Tapia Procedure Date: 04/29/2020 1:12 PM MRN: [id number] Account #: [id number] Date of Birth: 10/18/1955 Admit Type: Outpatient Age: 65 Room: [facility] ENDO ROOM 3 Gender: Male Note Status: Finalized Procedure:             Colonoscopy Indications:           High risk colon cancer surveillance: Personal history                         of multiple (3 or more) adenomas Providers:             Andrey Farmer MD, MD Referring MD:          Caprice Renshaw MD (Referring MD) Medicines:             Monitored Anesthesia Care Complications:         No immediate complications. Estimated blood loss:                         Minimal. Procedure:             Pre-Anesthesia Assessment:                        - Prior to the procedure, a History and Physical was                         performed, and patient medications and allergies were                         reviewed. The patient is competent. The risks and                         benefits of the procedure and the sedation options and                         risks were discussed with the patient. All questions                         were answered and informed consent was obtained.                         Patient identification and proposed procedure were                         verified by the physician, the nurse, the anesthetist                         and the technician in the endoscopy suite. Mental                         Status Examination: alert and oriented. Airway                         Examination: normal oropharyngeal airway and neck                         mobility. Respiratory Examination: clear to  auscultation. CV Examination: normal. Prophylactic                         Antibiotics: The patient does not require prophylactic                         antibiotics. Prior Anticoagulants: The patient has                         taken no previous  anticoagulant or antiplatelet                         agents. ASA Grade Assessment: II - A patient with mild                         systemic disease. After reviewing the risks and                         benefits, the patient was deemed in satisfactory                         condition to undergo the procedure. The anesthesia                         plan was to use monitored anesthesia care (MAC).                         Immediately prior to administration of medications,                         the patient was re-assessed for adequacy to receive                         sedatives. The heart rate, respiratory rate, oxygen                         saturations, blood pressure, adequacy of pulmonary                         ventilation, and response to care were monitored                         throughout the procedure. The physical status of the                         patient was re-assessed after the procedure.                        After obtaining informed consent, the colonoscope was                         passed under direct vision. Throughout the procedure,                         the patient's blood pressure, pulse, and oxygen                         saturations were monitored continuously. The  Colonoscope was introduced through the anus and                         advanced to the the cecum, identified by appendiceal                         orifice and ileocecal valve. The colonoscopy was                         performed without difficulty. The patient tolerated                         the procedure well. The quality of the bowel                         preparation was good. Findings:      The perianal and digital rectal examinations were normal.      A few small-mouthed diverticula were found in the ascending colon.      Many small-mouthed diverticula were found in the sigmoid colon.      A 3 mm polyp was found in the proximal descending colon. The polyp was        sessile. The polyp was removed with a cold snare. Resection and       retrieval were complete. Estimated blood loss was minimal.      Non-bleeding internal hemorrhoids were found during retroflexion. The       hemorrhoids were Grade I (internal hemorrhoids that do not prolapse).      The exam was otherwise without abnormality on direct and retroflexion       views. Impression:            - Diverticulosis in the ascending colon.                        - Diverticulosis in the sigmoid colon.                        - One 3 mm polyp in the proximal descending colon,                         removed with a cold snare. Resected and retrieved.                        - Non-bleeding internal hemorrhoids.                        - The examination was otherwise normal on direct and                         retroflexion views. Recommendation:        - Discharge patient to home.                        - Resume previous diet.                        - Continue present medications.                        - Await pathology results.                        -  Repeat colonoscopy in 5 years for surveillance.                        - Return to referring physician as previously                         scheduled. Procedure Code(s):     --- Professional ---                        [phone], Colonoscopy, flexible; with removal of                         tumor(s), polyp(s), or other lesion(s) by snare                         technique Diagnosis Code(s):     --- Professional ---                        Z86.010, Personal history of colonic polyps                        K64.0, First degree hemorrhoids                        K63.5, Polyp of colon                        K57.30, Diverticulosis of large intestine without                         perforation or abscess without bleeding CPT copyright 2019 [employer]. All rights reserved. The codes documented in this report are preliminary and upon coder  review may  be revised to meet current compliance requirements. Andrey Farmer MD, MD 04/29/2020 2:01:45 PM Number of Addenda: 0 Note Initiated On: 04/29/2020 1:12 PM Scope Withdrawal Time: 0 hours 13 minutes 50 seconds  Total Procedure Duration: 0 hours 18 minutes 4 seconds  Estimated Blood Loss:  Estimated blood loss was minimal.      [facility]

## 2020-04-29 NOTE — Transfer of Care (Signed)
Immediate Anesthesia Transfer of Care Note  Patient: Allen Tapia  Procedure(s) Performed: COLONOSCOPY WITH PROPOFOL (N/A ) ESOPHAGOGASTRODUODENOSCOPY (EGD) WITH PROPOFOL (N/A )  Patient Location: PACU  Anesthesia Type:General  Level of Consciousness: awake, alert  and oriented  Airway & Oxygen Therapy: Patient Spontanous Breathing  Post-op Assessment: Report given to RN and Post -op Vital signs reviewed and stable  Post vital signs: Reviewed and stable  Last Vitals:  Vitals Value Taken Time  BP    Temp    Pulse 58 04/29/20 1358  Resp 13 04/29/20 1358  SpO2 99 % 04/29/20 1358  Vitals shown include unvalidated device data.  Last Pain:  Vitals:   04/29/20 1251  TempSrc: Temporal         Complications: No complications documented.

## 2020-04-29 NOTE — Anesthesia Postprocedure Evaluation (Signed)
Anesthesia Post Note  Patient: Allen Tapia  Procedure(s) Performed: COLONOSCOPY WITH PROPOFOL (N/A ) ESOPHAGOGASTRODUODENOSCOPY (EGD) WITH PROPOFOL (N/A )  Patient location during evaluation: Endoscopy Anesthesia Type: General Level of consciousness: awake and alert Pain management: pain level controlled Vital Signs Assessment: post-procedure vital signs reviewed and stable Respiratory status: spontaneous breathing, nonlabored ventilation, respiratory function stable and patient connected to nasal cannula oxygen Cardiovascular status: blood pressure returned to baseline and stable Postop Assessment: no apparent nausea or vomiting Anesthetic complications: no   No complications documented.   Last Vitals:  Vitals:   04/29/20 1251 04/29/20 1358  BP: 131/82 99/61  Pulse: 64   Resp: 16   Temp: (!) 36.1 C (!) 36.1 C  SpO2: 97%     Last Pain:  Vitals:   04/29/20 1408  TempSrc:   PainSc: 0-No pain                 Precious Haws Piscitello

## 2020-05-02 ENCOUNTER — Telehealth: Payer: Self-pay | Admitting: Urology

## 2020-05-02 NOTE — Telephone Encounter (Signed)
Pt. Did not want to schedule Urolift in January and ask for me to call and let him know the date for February. I Left a message on pt.'s  Voicemail today to call me regarding the Urolift date and get him scheduled. The date will be February 25,2022. I need him to be scheduled the week prior for a preop ucx and to see me for surgery info.

## 2020-05-03 LAB — SURGICAL PATHOLOGY

## 2020-05-09 ENCOUNTER — Ambulatory Visit: Payer: Medicare Other | Admitting: Podiatry

## 2020-05-09 DIAGNOSIS — I2510 Atherosclerotic heart disease of native coronary artery without angina pectoris: Secondary | ICD-10-CM

## 2020-05-09 MED ORDER — ROSUVASTATIN CALCIUM 20 MG PO TABS: 20.0000 mg | ORAL_TABLET | Freq: Every day | ORAL | 3 refills | Status: DC

## 2020-05-09 NOTE — Addendum Note (Signed)
Addended by: Caprice Beaver T on: 05/09/2020 11:08 AM   Modules accepted: Orders

## 2020-05-12 ENCOUNTER — Other Ambulatory Visit: Payer: Medicare Other

## 2020-05-12 ENCOUNTER — Other Ambulatory Visit: Payer: Self-pay

## 2020-05-12 DIAGNOSIS — N138 Other obstructive and reflux uropathy: Secondary | ICD-10-CM

## 2020-05-12 DIAGNOSIS — N401 Enlarged prostate with lower urinary tract symptoms: Secondary | ICD-10-CM

## 2020-05-13 ENCOUNTER — Other Ambulatory Visit: Payer: Self-pay

## 2020-05-13 ENCOUNTER — Other Ambulatory Visit: Payer: Self-pay | Admitting: Urology

## 2020-05-13 ENCOUNTER — Other Ambulatory Visit: Payer: Medicare Other

## 2020-05-13 DIAGNOSIS — N401 Enlarged prostate with lower urinary tract symptoms: Secondary | ICD-10-CM

## 2020-05-13 LAB — URINALYSIS, COMPLETE
Bilirubin, UA: NEGATIVE
Glucose, UA: NEGATIVE
Ketones, UA: NEGATIVE
Nitrite, UA: NEGATIVE
Protein,UA: NEGATIVE
RBC, UA: NEGATIVE
Specific Gravity, UA: 1.02 (ref 1.005–1.030)
Urobilinogen, Ur: 0.2 mg/dL (ref 0.2–1.0)
pH, UA: 6.5 (ref 5.0–7.5)

## 2020-05-13 LAB — MICROSCOPIC EXAMINATION: RBC, Urine: NONE SEEN /hpf (ref 0–2)

## 2020-05-17 ENCOUNTER — Other Ambulatory Visit
Admission: RE | Admit: 2020-05-17 | Discharge: 2020-05-17 | Disposition: A | Discharge status: Home / Self Care | Payer: Medicare Other | Source: Ambulatory Visit | Attending: Urology | Admitting: Urology

## 2020-05-17 ENCOUNTER — Other Ambulatory Visit: Payer: Self-pay

## 2020-05-17 DIAGNOSIS — Z01812 Encounter for preprocedural laboratory examination: Secondary | ICD-10-CM | POA: Insufficient documentation

## 2020-05-17 DIAGNOSIS — Z20822 Contact with and (suspected) exposure to covid-19: Secondary | ICD-10-CM | POA: Insufficient documentation

## 2020-05-17 NOTE — Patient Instructions (Signed)
Your procedure is scheduled on: 05/20/2020 Report to the Registration Desk on the 1st floor of the [facility]. To find out your arrival time, please call [phone] between 1PM - 3PM on: 05/19/2020  REMEMBER: Instructions that are not followed completely may result in serious medical risk, up to and including death; or upon the discretion of your surgeon and anesthesiologist your surgery may need to be rescheduled.  Do not eat food or drink any fluids after midnight the night before surgery.  No gum chewing, lozengers or hard candies. Marland Kitchen  TAKE THESE MEDICATIONS THE MORNING OF SURGERY WITH A SIP OF WATER: - baclofen (LIORESAL) 20 MG tablet - budesonide-formoterol (SYMBICORT) 160-4.5 MCG/ACT inhaler - buPROPion (WELLBUTRIN XL) 150 MG 24 hr tablet - Fingolimod HCl 0.5 MG CAPS - gabapentin (NEURONTIN) 300 MG capsule - omeprazole (PRILOSEC) 40 MG capsule, take one the night before and one on the morning of surgery - helps to prevent nausea after     surgery. - tamsulosin (FLOMAX) 0.4 MG CAPS capsule  Use inhaler albuterol (VENTOLIN HFA) 108 (90 Base) MCG/ACT inhaler on the day of surgery and bring to the hospital.  Follow recommendations from Cardiologist, Pulmonologist or PCP regarding stopping Aspirin, Coumadin, Plavix, Eliquis, Pradaxa, or Pletal.  One week prior to surgery: Stop Anti-inflammatories (NSAIDS) such as Advil, Aleve, Ibuprofen, Motrin, Naproxen, Naprosyn and Aspirin based products such as Excedrin, Goodys Powder, BC Powder.  Stop ANY OVER THE COUNTER supplements until after surgery. (However, you may continue taking Vitamin D, Vitamin B, and multivitamin up until the day before surgery.)  No Alcohol for 24 hours before or after surgery.  No Smoking including e-cigarettes for 24 hours prior to surgery.  No chewable tobacco products for at least 6 hours prior to surgery.  No nicotine patches on the day of surgery.  Do not use any "recreational" drugs for at least a  week prior to your surgery.  Please be advised that the combination of cocaine and anesthesia may have negative outcomes, up to and including death. If you test positive for cocaine, your surgery will be cancelled.  On the morning of surgery brush your teeth with toothpaste and water, you may rinse your mouth with mouthwash if you wish. Do not swallow any toothpaste or mouthwash.  Do not wear jewelry, make-up, hairpins, clips or nail polish.  Do not wear lotions, powders, or perfumes.   Do not shave body from the neck down 48 hours prior to surgery just in case you cut yourself which could leave a site for infection.  Also, freshly shaved skin may become irritated if using the CHG soap.  Contact lenses, hearing aids and dentures may not be worn into surgery.  Do not bring valuables to the hospital. [facility] is not responsible for any missing/lost belongings or valuables.   Notify your doctor if there is any change in your medical condition (cold, fever, infection).  Wear comfortable clothing (specific to your surgery type) to the hospital.  Plan for stool softeners for home use; pain medications have a tendency to cause constipation. You can also help prevent constipation by eating foods high in fiber such as fruits and vegetables and drinking plenty of fluids as your diet allows.  After surgery, you can help prevent lung complications by doing breathing exercises.  Take deep breaths and cough every 1-2 hours. Your doctor may order a device called an Incentive Spirometer to help you take deep breaths. When coughing or sneezing, hold a pillow firmly against  your incision with both hands. This is called "splinting." Doing this helps protect your incision. It also decreases belly discomfort.  If you are being admitted to the hospital overnight, leave your suitcase in the car. After surgery it may be brought to your room.  If you are being discharged the day of surgery, you will not be  allowed to drive home. You will need a responsible adult (18 years or older) to drive you home and stay with you that night.   If you are taking public transportation, you will need to have a responsible adult (18 years or older) with you. Please confirm with your physician that it is acceptable to use public transportation.   Please call the [facility] at [phone] if you have any questions about these instructions.  Visitation Policy:  Patients undergoing a surgery or procedure may have one family member or support person with them as long as that person is not COVID-19 positive or experiencing its symptoms.  That person may remain in the waiting area during the procedure.  Inpatient Visitation:    Visiting hours are 7 a.m. to 8 p.m. Patients will be allowed one visitor. The visitor may change daily. The visitor must pass COVID-19 screenings, use hand sanitizer when entering and exiting the patient's room and wear a mask at all times, including in the patient's room. Patients must also wear a mask when staff or their visitor are in the room. Masking is required regardless of vaccination status. Systemwide, no visitors 17 or younger.

## 2020-05-18 ENCOUNTER — Encounter: Payer: Self-pay | Admitting: Urology

## 2020-05-18 ENCOUNTER — Ambulatory Visit (INDEPENDENT_AMBULATORY_CARE_PROVIDER_SITE_OTHER): Payer: Medicare Other | Admitting: Cardiology

## 2020-05-18 ENCOUNTER — Encounter: Payer: Self-pay | Admitting: Cardiology

## 2020-05-18 ENCOUNTER — Other Ambulatory Visit
Admission: RE | Admit: 2020-05-18 | Discharge: 2020-05-18 | Disposition: A | Discharge status: Home / Self Care | Payer: Medicare Other | Source: Ambulatory Visit | Attending: Urology | Admitting: Urology

## 2020-05-18 VITALS — BP 124/78 | HR 64 | Ht 72.0 in | Wt 206.0 lb

## 2020-05-18 DIAGNOSIS — I2510 Atherosclerotic heart disease of native coronary artery without angina pectoris: Secondary | ICD-10-CM | POA: Diagnosis not present

## 2020-05-18 DIAGNOSIS — Z20822 Contact with and (suspected) exposure to covid-19: Secondary | ICD-10-CM | POA: Diagnosis not present

## 2020-05-18 DIAGNOSIS — Z7189 Other specified counseling: Secondary | ICD-10-CM | POA: Diagnosis not present

## 2020-05-18 DIAGNOSIS — Z01812 Encounter for preprocedural laboratory examination: Secondary | ICD-10-CM | POA: Diagnosis present

## 2020-05-18 DIAGNOSIS — Z8249 Family history of ischemic heart disease and other diseases of the circulatory system: Secondary | ICD-10-CM

## 2020-05-18 DIAGNOSIS — E7800 Pure hypercholesterolemia, unspecified: Secondary | ICD-10-CM | POA: Diagnosis not present

## 2020-05-18 LAB — SARS CORONAVIRUS 2 (TAT 6-24 HRS): SARS Coronavirus 2: NEGATIVE

## 2020-05-18 LAB — CULTURE, URINE COMPREHENSIVE

## 2020-05-18 NOTE — Patient Instructions (Signed)

## 2020-05-18 NOTE — Progress Notes (Signed)
Perioperative Services  Pre-Admission/Anesthesia Testing Clinical Review  Date: 05/19/20  Patient Demographics:  Name: Allen Tapia DOB:   08/09/1955 MRN:   [id number]  Planned Surgical Procedure(s):    Case: [id number] Date/Time: 05/20/20 0817   Procedure: CYSTOSCOPY WITH INSERTION OF UROLIFT (N/A )   Anesthesia type: Choice   Pre-op diagnosis: bph WITH INCOMPLETE EMPTYING   Location: [place] [facility] [place] [facility]   Surgeons: Billey Co, MD    NOTE: Available PAT nursing documentation and vital signs have been reviewed. Clinical nursing staff has updated patient's PMH/PSHx, current medication list, and drug allergies/intolerances to ensure comprehensive history available to assist in medical decision making as it pertains to the aforementioned surgical procedure and anticipated anesthetic course.   Clinical Discussion:  Allen Tapia is a 65 y.o. male who is submitted for pre-surgical anesthesia review and clearance prior to him undergoing the above procedure. Patient is a Former Smoker (20 pack years; quit 08/2006). Pertinent PMH includes: CAD, ischemic heart disease, atypical chest pain, HLD, shortness of breath, asthma, multiple sclerosis, GERD with associated Barrett's esophagus (on daily PPI), depression  Patient is followed by cardiology Harrell Gave, MD). He was last seen in the cardiology clinic on 05/18/2020; notes reviewed.  At the time of his clinic visit, patient doing well overall from a cardiovascular perspective.  Patient reported "mild twinges" with standing and positional changes that was felt to be likely musculoskeletal in nature.  Patient denied shortness of breath, PND, orthopnea, palpitations, peripheral edema, vertiginous symptoms, and presyncope/syncope. Patient underwent myocardial perfusion imaging study in 06/2007 that demonstrated no evidence of stress-induced myocardial ischemia, scar, or arrhythmia.  Subsequent diagnostic left heart  catheterization performed on 07/14/2007 revealed significant single-vessel CAD with a 70% stenosis of the mid LAD; LVEF 65%. There were other areas of nonobstructive disease noted the decision was made to defer treatment as patient's chest pain was atypical and his stress test did not show any anterior ischemia; medical management was recommended (see full interpretation of cardiovascular testing below).  Patient is on GDMT for his HLD diagnosis using daily rosuvastatin.  Blood pressure well controlled at 124/78; does not take any medications.  Patient making efforts to remain active (weather permitting). Functional capacity, as defined by DASI, is documented as being >/= 4 METS.  No changes were made to patient's medication regimen.  Patient to follow-up with outpatient cardiology in 6 months or sooner if needed.  Patient also being followed at [facility] for a diagnosis of multiple sclerosis.  He was last seen by his neurologist Moshe Cipro, MD) on 05/04/2020; notes reviewed.  Patient reported that since his last visit, no new neurological deficits or symptoms have declared.  Neurologist described that [place] could be implicated in patient's history of shingles, recurrent warts, and skin cancers, however patient did not wish to discontinue therapy.  Patient complained of PHN that was being effectively managed by his dose of gabapentin.  Exam revealed mild slowing of patient's fine motor skills in his bilateral upper extremities.  Patient unsteady during Romberg testing, however Romberg sign was not present.  Able to perform tandem gait testing with some noted difficulty. No changes were made to patient's medication regimen.  Patient to follow-up with outpatient neurology in 6 months or sooner if needed.  Patient scheduled to undergo an elective urological procedure on 05/20/2020 with Dr. Nickolas Madrid.  Given patient's past medical history significant cardiovascular diagnoses, presurgical cardiac clearance was sought by  the PAT team.  Per  cardiology, based on patient's past medical history and time since his last clinic visit, patient would be at an overall ACCEPTABLE risk for the planned procedure without further cardiovascular testing or intervention at this time. This patient is on daily antiplatelet therapy.  He has been instructed on recommendations for holding his daily low-dose ASA by attending surgeon with plans to restart as soon as postoperative bleeding risk felt to be minimized by his attending surgeon.    Patient denies previous perioperative complications with anesthesia in the past. In review of the available records, it is noted that patient underwent a general anesthetic course here (ASA II) in 04/2020 without documented complications.   Vitals with BMI 05/18/2020 04/29/2020 04/29/2020  Height 6\' 0"  - 6\' 2"   Weight 206 lbs - 190 lbs  BMI 64.33 - 29.51  Systolic 884 99 166  Diastolic 78 61 82  Pulse 64 - 64    Providers/Specialists:   NOTE: Primary physician provider listed below. Patient may have been seen by APP or partner within same practice.   PROVIDER ROLE / SPECIALTY LAST Ranae Pila, MD Urology (Surgeon)  01/21/2020  Derinda Late, MD Primary Care Provider  03/01/2020  Buford Dresser, MD Cardiology  05/18/2020  Diamantina Monks, MD Neurology  05/04/2020   Allergies:  Patient has no known allergies.  Current Home Medications:   No current facility-administered medications for this encounter.   Marland Kitchen acetaminophen (TYLENOL) 500 MG tablet  . albuterol (VENTOLIN HFA) 108 (90 Base) MCG/ACT inhaler  . amphetamine-dextroamphetamine (ADDERALL XR) 25 MG 24 hr capsule  . baclofen (LIORESAL) 20 MG tablet  . budesonide-formoterol (SYMBICORT) 160-4.5 MCG/ACT inhaler  . buPROPion (WELLBUTRIN XL) 150 MG 24 hr tablet  . cholecalciferol (VITAMIN D) 1000 units tablet  . diphenhydrAMINE (BENADRYL) 25 MG tablet  . Fingolimod HCl 0.5 MG CAPS  . fluorouracil (EFUDEX) 5 % cream  .  gabapentin (NEURONTIN) 300 MG capsule  . montelukast (SINGULAIR) 10 MG tablet  . omeprazole (PRILOSEC) 40 MG capsule  . rosuvastatin (CRESTOR) 20 MG tablet  . tamsulosin (FLOMAX) 0.4 MG CAPS capsule  . traZODone (DESYREL) 100 MG tablet  . aspirin EC 81 MG tablet  . ibuprofen (ADVIL) 800 MG tablet  . Spacer/Aero-Holding Chambers (AEROCHAMBER MV) inhaler   History:   Past Medical History:  Diagnosis Date  . Asthma   . Atypical chest pain   . Barrett's esophagus   . Bilateral cataracts   . Chicken pox   . Chronic constipation   . Coronary artery disease 12/20/2010  . Depression   . Foreign body (FB) in soft tissue   . Former tobacco use   . GERD (gastroesophageal reflux disease)   . History of 2019 novel coronavirus disease (COVID-19) 04/07/2019  . Hyperlipidemia   . Hyperplastic colon polyp   . Ischemic heart disease 12/20/2010  . MS (multiple sclerosis) ([place])   . Shingles   . Tubular adenoma of colon 08/23/2016   Past Surgical History:  Procedure Laterality Date  . ADENOIDECTOMY  1960  . APPENDECTOMY  1979  . ARTHROPLASTY Right 01/02/2011   partial meniscectomy   . CARDIAC CATHETERIZATION    . CATARACT EXTRACTION, BILATERAL  03/12/2019  . COLONOSCOPY  02/05/2008  . COLONOSCOPY WITH PROPOFOL N/A 08/23/2016   Procedure: COLONOSCOPY WITH PROPOFOL;  Surgeon: Lollie Sails, MD;  Location: [facility] ENDOSCOPY;  Service: Endoscopy;  Laterality: N/A;  . COLONOSCOPY WITH PROPOFOL N/A 04/29/2020   Procedure: COLONOSCOPY WITH PROPOFOL;  Surgeon: Lesly Rubenstein, MD;  Location: [facility] ENDOSCOPY;  Service: Endoscopy;  Laterality: N/A;  . ESOPHAGOGASTRODUODENOSCOPY (EGD) WITH PROPOFOL N/A 08/23/2016   Procedure: ESOPHAGOGASTRODUODENOSCOPY (EGD) WITH PROPOFOL;  Surgeon: Lollie Sails, MD;  Location: [facility] ENDOSCOPY;  Service: Endoscopy;  Laterality: N/A;  . ESOPHAGOGASTRODUODENOSCOPY (EGD) WITH PROPOFOL N/A 04/29/2020   Procedure: ESOPHAGOGASTRODUODENOSCOPY (EGD) WITH PROPOFOL;   Surgeon: Lesly Rubenstein, MD;  Location: [facility] ENDOSCOPY;  Service: Endoscopy;  Laterality: N/A;  . ESOPHAGOGASTRODUODENOSCOPY ENDOSCOPY  02/05/2008  . EYE SURGERY    . eye trauma  1970   piece of metal to eye removed in clinic  . FRACTURE SURGERY Right    wrist   . FRACTURE SURGERY Right    knee; partial meniscectomy  . right wrist surgery    . SEPTOPLASTY    . TONSILLECTOMY  02/05/2008   Family History  Problem Relation Age of Onset  . Heart disease Mother   . Glaucoma Mother   . Irregular heart beat Mother   . Cardiomyopathy Mother   . Colon polyps Mother   . Cataracts Mother   . Hypertension Father   . Heart disease Father   . Cancer Father   . Esophageal cancer Cousin    Social History   Tobacco Use  . Smoking status: Former Smoker    Packs/day: 1.00    Years: 20.00    Pack years: 20.00    Types: Cigarettes    Quit date: 09/23/2006    Years since quitting: 13.6  . Smokeless tobacco: Never Used  Vaping Use  . Vaping Use: Never used  Substance Use Topics  . Alcohol use: No  . Drug use: No    Pertinent Clinical Results:  LABS: Labs reviewed: Acceptable for surgery.      ECG: Date: 05/19/2020 Time ECG obtained: 0907 AM Rate: 64 bpm Rhythm: normal sinus Axis (leads I and aVF): Normal Intervals: PR 160 ms. QRS 106 ms. QTc 427 ms. ST segment and T wave changes: No evidence of acute ST segment elevation or depression Comparison: Similar to previous tracing obtained on 05/04/2019. No previous tracings available for review and comparison.   IMAGING / PROCEDURES: STRESS ECHOCARDIOGRAM performed on 08/11/2007 1. LVEF greater than 55% 2. Trivial MR, TR, and PR 3. No AR 4. No evidence of valvular stenosis 5. G2DD 6. Stress ECG results normal 7. Normal stress echocardiogram  LEFT HEART CATHETERIZATION AND CORONARY ANGIOGRAPHY performed on 07/14/2007 1. EF 65% 2. Right-sided dominance 3. Severe single-vessel CAD  Mid LAD 70% stenosis 4. Evidence of  multivessel nonobstructive CAD  Proximal RCA 40% stenosis  Distal RCA 40% stenosis  RPL2 and RPL3 small  LM 30% stenosis  Mid LCx 20% stenosis  Mid LCx 40% stenosis  OM 3 (large) 20% stenosis  Proximal LAD 20% stenosis  Mid LAD 40% stenosis  Distal LAD 20% stenosis   D1 40% stenosis  D2 40% stenosis 5. No intervention due to fairly atypical chest pain, stress test negative for signs of anterior ischemia, and good exercise tolerance; medical management recommended  MYOVIEW performed on 07/08/2007 1. LVEF 60% 2. Regional wall motion reveals normal wall motion and myocardial thickening 3. No evidence of stress-induced myocardial ischemia or arrhythmia  Impression and Plan:  Allen Tapia has been referred for pre-anesthesia review and clearance prior to him undergoing the planned anesthetic and procedural courses. Available labs, pertinent testing, and imaging results were personally reviewed by me. This patient has been appropriately cleared by cardiology with an overall ACCEPTABLE risk of significant perioperative cardiovascular complications.  Based on clinical review performed today (05/19/20), barring any significant acute changes in the patient's overall condition, it is anticipated that he will be able to proceed with the planned surgical intervention. Any acute changes in clinical condition may necessitate his procedure being postponed and/or cancelled. Pre-surgical instructions were reviewed with the patient during his PAT appointment and questions were fielded by PAT clinical staff.  Honor Loh, MSN, APRN, FNP-C, CEN [facility]  Peri-operative Services Nurse Practitioner Phone: [phone] 05/19/20 12:56 PM  NOTE: This note has been prepared using Dragon dictation software. Despite my best ability to proofread, there is always the potential that unintentional transcriptional errors may still occur from this process.

## 2020-05-18 NOTE — Progress Notes (Signed)
Cardiology Office Note:    Date:  05/18/2020   ID:  NITESH PITSTICK, DOB 06/28/1955, MRN [id number]  PCP:  Derinda Late, MD  Cardiologist:  Buford Dresser, MD  Referring MD: Derinda Late, MD   CC: follow up  History of Present Illness:    Allen Tapia is a 65 y.o. male with a hx of coronary disease, multiple sclerosis, squamous cell skin cancer who is seen for follow up. I initially met him 05/04/19 as a new consult at the request of Derinda Late, MD for the evaluation and management of coronary artery disease.  Cardiac history: Cardiac cath ([facility]) 07/14/2007: obstructive LAD disease only, not intervened on as there was no matching ischemia on stress testing/atypical symptoms, EF 65%. Other vessels with nonobstructive lesions Risk: hyperlipidemia, former tobacco use.   Has chronic MSK chest pain, chronic, always sternal. Occurs with very specific movements, and he knows what will trigger it.  Have discussed adderall at prior visit.  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   Today: Doing well overall.  Has urolift surgery this Friday for his prostate. Given copy of ECG to bring to his preop appt. Had to hold aspirin pending his upcoming procedure. Continues to bruise/bleed easily. No melena/hematochezia/hematuria.  Had a third round of Covid infection, despite three immunizations. Last round was mild but still not fully recovered form initial episode 03/2018.   Remains active, not as active as when the weather is good, but no limitations. Renovating a house. Likely will need a knee replacement in the next few months.   Continues to have mild chest twinges with standing/position changes, not exertional. Thinks it is likely MSK.   Denies shortness of breath at rest or with normal exertion. No PND, orthopnea, LE edema or unexpected weight gain. No syncope or palpitations.  Past Medical  History:  Diagnosis Date   Asthma    Atypical chest pain    Barrett's esophagus    Bilateral cataracts    Chicken pox    Chronic constipation    Coronary artery disease 12/20/2010   Depression    Foreign body (FB) in soft tissue    Former tobacco use    GERD (gastroesophageal reflux disease)    Hyperlipidemia    Hyperplastic colon polyp    Ischemic heart disease 12/20/2010   MS (multiple sclerosis) ([place])    Shingles    Tubular adenoma of colon 08/23/2016    Past Surgical History:  Procedure Laterality Date   [place] Right 01/02/2011   partial meniscectomy    CARDIAC CATHETERIZATION     CATARACT EXTRACTION, BILATERAL  03/12/2019   COLONOSCOPY  02/05/2008   COLONOSCOPY WITH PROPOFOL N/A 08/23/2016   Procedure: COLONOSCOPY WITH PROPOFOL;  Surgeon: Lollie Sails, MD;  Location: [facility] ENDOSCOPY;  Service: Endoscopy;  Laterality: N/A;   COLONOSCOPY WITH PROPOFOL N/A 04/29/2020   Procedure: COLONOSCOPY WITH PROPOFOL;  Surgeon: Lesly Rubenstein, MD;  Location: [facility] ENDOSCOPY;  Service: Endoscopy;  Laterality: N/A;   ESOPHAGOGASTRODUODENOSCOPY (EGD) WITH PROPOFOL N/A 08/23/2016   Procedure: ESOPHAGOGASTRODUODENOSCOPY (EGD) WITH PROPOFOL;  Surgeon: Lollie Sails, MD;  Location: [facility] ENDOSCOPY;  Service: Endoscopy;  Laterality: N/A;   ESOPHAGOGASTRODUODENOSCOPY (EGD) WITH PROPOFOL N/A 04/29/2020   Procedure: ESOPHAGOGASTRODUODENOSCOPY (EGD) WITH PROPOFOL;  Surgeon: Lesly Rubenstein, MD;  Location: [facility] ENDOSCOPY;  Service: Endoscopy;  Laterality: N/A;  ESOPHAGOGASTRODUODENOSCOPY ENDOSCOPY  02/05/2008   EYE SURGERY     eye trauma  1970   piece of metal to eye removed in clinic   FRACTURE SURGERY Right    wrist    FRACTURE SURGERY Right    knee; partial meniscectomy   right wrist surgery     SEPTOPLASTY     TONSILLECTOMY  02/05/2008    Current Medications: Current Outpatient Medications  on File Prior to Visit  Medication Sig   acetaminophen (TYLENOL) 500 MG tablet Take 1,000 mg by mouth every 6 (six) hours as needed for moderate pain.   albuterol (VENTOLIN HFA) 108 (90 Base) MCG/ACT inhaler Inhale 2 puffs into the lungs every 6 (six) hours as needed for wheezing or shortness of breath.   amphetamine-dextroamphetamine (ADDERALL XR) 25 MG 24 hr capsule Take 25 mg by mouth daily as needed (focus).   aspirin EC 81 MG tablet Take 81 mg by mouth daily.   baclofen (LIORESAL) 20 MG tablet Take 20 mg by mouth 3 (three) times daily.   budesonide-formoterol (SYMBICORT) 160-4.5 MCG/ACT inhaler Inhale 2 puffs into the lungs in the morning and at bedtime.   buPROPion (WELLBUTRIN XL) 150 MG 24 hr tablet Take 150 mg by mouth daily.   cholecalciferol (VITAMIN D) 1000 units tablet Take 2,000 Units by mouth daily.   diphenhydrAMINE (BENADRYL) 25 MG tablet Take 25 mg by mouth at bedtime.   Fingolimod HCl 0.5 MG CAPS Take 0.5 mg by mouth every morning.   fluorouracil (EFUDEX) 5 % cream Apply 1 application topically daily.   gabapentin (NEURONTIN) 300 MG capsule Take 300 mg by mouth 2 (two) times daily.    ibuprofen (ADVIL) 800 MG tablet Take 800 mg by mouth every 6 (six) hours as needed.   omeprazole (PRILOSEC) 40 MG capsule Take 40 mg by mouth daily.   rosuvastatin (CRESTOR) 20 MG tablet Take 1 tablet (20 mg total) by mouth daily.   Spacer/Aero-Holding Chambers (AEROCHAMBER MV) inhaler Use as instructed   tamsulosin (FLOMAX) 0.4 MG CAPS capsule TAKE 1 CAPSULE DAILY (Patient taking differently: Take 0.4 mg by mouth daily.)   traZODone (DESYREL) 100 MG tablet Take 100 mg by mouth at bedtime.   montelukast (SINGULAIR) 10 MG tablet Take 1 tablet (10 mg total) by mouth at bedtime.   No current facility-administered medications on file prior to visit.     Allergies:   Patient has no known allergies.   Social History   Tobacco Use   Smoking status: Former Smoker     Packs/day: 1.00    Years: 20.00    Pack years: 20.00    Types: Cigarettes    Quit date: 09/23/2006    Years since quitting: 13.6   Smokeless tobacco: Never Used  Vaping Use   Vaping Use: Never used  Substance Use Topics   Alcohol use: No   Drug use: No    Family History: family history includes Cancer in his father; Cardiomyopathy in his mother; Cataracts in his mother; Colon polyps in his mother; Esophageal cancer in his cousin; Glaucoma in his mother; Heart disease in his father and mother; Hypertension in his father; Irregular heart beat in his mother. father had 5V CABG in his mid-50s, did well for 5 years, then quickly declined with cancer. Had hypertension. Mother had a viral illness and suspected cardiomyopathy from it, had a pacemaker for the last 15 years of life. Had unclear valve issue, pacemaker turned off at end of life  ROS:   Please  see the history of present illness.  Additional pertinent ROS otherwise unremarkable.  EKGs/Labs/Other Studies Reviewed:    The following studies were reviewed today: Cath 06/2007 ([facility]) CORONARY ARTERIOGRAMS Dominance: right SA node origin: right AV node origin: right BranchStenosisLesion TypeTIMI Flow ---------------------------------------------------- Right Coronary Artery Prox RCA40% Discrete Prox RCA40% Discrete Dist RCA40% Discrete RPL120% Discrete RPL2(Small) RPL3(Small) Left Main L Main30% Discrete Left Circumflex Mid LCX 20% Discrete Mid LCX 40% Discrete OM3(Large)20% Discrete OM3(Large)20% Discrete Left Anterior Descending Prox LAD20% Discrete Mid LAD 40% Diffuse *Mid  LAD70% Discrete Mid LAD 40% Discrete Dist LAD20% Discrete D140% Discrete D240% Discrete  MPI ([facility]) 07/08/2007 Left Ventricle Analysis :  Rest Stress Conclusions  - High AnterolateralNormal Normal Normal  - Low Anterolateral Normal Normal Normal  - High Posterolateral Normal Normal Normal  - Low PosterolateralNormal Normal Normal  - High Anterior Normal Normal Normal  - Low AnteriorNormal Normal Normal  - AnteroapicalNormal Normal Normal  - PosterobasalNormal Dec modIschemia  - InferiorNormal Dec modIschemia  - InferoapicalNormal Normal Normal  - InferoseptalNormal Normal Normal  - AnteroseptalNormal Normal Normal   EKG:  EKG is personally reviewed.  The ekg ordered today demonstrates NSR at 64 bpm  Recent Labs: No results found for requested labs within last 8760 hours.  Recent Lipid Panel No results found for: CHOL, TRIG, HDL, CHOLHDL, VLDL, LDLCALC, LDLDIRECT  Physical Exam:    VS:  BP 124/78 (BP Location: Left Arm, Patient Position: Sitting, Cuff Size: Normal)    Pulse 64    Ht 6' (1.829 m)    Wt 206 lb (93.4 kg)    BMI 27.94 kg/m     Wt Readings from Last 3 Encounters:  05/20/20 205 lb 0.4 oz (93 kg)  05/18/20 206 lb (93.4 kg)  04/29/20 190 lb (86.2 kg)    GEN: Well nourished, well developed in no acute distress HEENT: Normal, moist mucous membranes NECK: No JVD CARDIAC: regular rhythm, normal S1 and S2, no rubs or gallops. No murmur. VASCULAR: Radial and DP pulses 2+ bilaterally. No  carotid bruits RESPIRATORY:  Clear to auscultation without rales, wheezing or rhonchi  ABDOMEN: Soft, non-tender, non-distended MUSCULOSKELETAL:  Ambulates independently SKIN: Warm and dry, no edema NEUROLOGIC:  Alert and oriented x 3. No focal neuro deficits noted. PSYCHIATRIC:  Normal affect   ASSESSMENT:    1. Coronary artery disease involving native coronary artery of native heart without angina pectoris   2. Pure hypercholesterolemia   3. Family history of heart disease   4. Cardiac risk counseling   5. Counseling on health promotion and disease prevention    PLAN:    Coronary artery disease without angina: diagnosed by cath at [facility] -family history of heart disease is his primary risk -no typical angina -on aspirin 81 mg daily -Has been counseled on adderall and NSAIDs with known CAD -lipid management, as below -doing well off metoprolol -discussed red flag symptoms that need immediate medical attention  Hypercholesterolemia: -tolerating rosuvastatin 20 mg daily -lipids at [facility] 02/23/20 show Tchol 130, TG 53, HDL 51, LDL 68 -doing well with diet and exercise  Cardiac risk counseling and prevention recommendations: -recommend heart healthy/Mediterranean diet, with whole grains, fruits, vegetable, fish, lean meats, nuts, and olive oil. Limit salt. -recommend moderate walking, 3-5 times/week for 30-50 minutes each session. Aim for at least 150 minutes.week. Goal should be pace of 3 miles/hours, or walking 1.5 miles in 30 minutes -recommend avoidance of tobacco products. Avoid excess alcohol.  Plan for follow up: 1 year or sooner  as needed  Buford Dresser, MD, PhD, [facility]    Medication Adjustments/Labs and Tests Ordered: Current medicines are reviewed at length with the patient today.  Concerns regarding medicines are outlined above.  Orders Placed This Encounter  Procedures   EKG 12-Lead   No orders of the defined types were placed  in this encounter.   Patient Instructions  Medication Instructions:  Your Physician recommend you continue on your current medication as directed.    *If you need a refill on your cardiac medications before your next appointment, please call your pharmacy*   Lab Work: None   Testing/Procedures: None   Follow-Up: At [facility], you and your health needs are our priority.  As part of our continuing mission to provide you with exceptional heart care, we have created designated Provider Care Teams.  These Care Teams include your primary Cardiologist (physician) and Advanced Practice Providers (APPs -  Physician Assistants and Nurse Practitioners) who all work together to provide you with the care you need, when you need it.  We recommend signing up for the patient portal called "MyChart".  Sign up information is provided on this After Visit Summary.  MyChart is used to connect with patients for Virtual Visits (Telemedicine).  Patients are able to view lab/test results, encounter notes, upcoming appointments, etc.  Non-urgent messages can be sent to your provider as well.   To learn more about what you can do with MyChart, go to [email or web address].    Your next appointment:   1 year(s)  The format for your next appointment:   In Person  Provider:   Buford Dresser, MD     Signed, Buford Dresser, MD PhD 05/18/2020  [place]

## 2020-05-20 ENCOUNTER — Encounter
Admission: RE | Disposition: A | Discharge status: Home / Self Care | Payer: Self-pay | Source: Home / Self Care | Attending: Urology

## 2020-05-20 ENCOUNTER — Ambulatory Visit: Payer: Medicare Other | Admitting: Urgent Care

## 2020-05-20 ENCOUNTER — Ambulatory Visit
Admission: RE | Admit: 2020-05-20 | Discharge: 2020-05-20 | Disposition: A | Discharge status: Home / Self Care | Payer: Medicare Other | Attending: Urology | Admitting: Urology

## 2020-05-20 ENCOUNTER — Encounter: Payer: Self-pay | Admitting: Urology

## 2020-05-20 ENCOUNTER — Other Ambulatory Visit: Payer: Self-pay

## 2020-05-20 DIAGNOSIS — N401 Enlarged prostate with lower urinary tract symptoms: Secondary | ICD-10-CM | POA: Insufficient documentation

## 2020-05-20 DIAGNOSIS — Z79899 Other long term (current) drug therapy: Secondary | ICD-10-CM | POA: Diagnosis not present

## 2020-05-20 DIAGNOSIS — R350 Frequency of micturition: Secondary | ICD-10-CM | POA: Insufficient documentation

## 2020-05-20 DIAGNOSIS — R3912 Poor urinary stream: Secondary | ICD-10-CM | POA: Insufficient documentation

## 2020-05-20 DIAGNOSIS — Z7982 Long term (current) use of aspirin: Secondary | ICD-10-CM | POA: Diagnosis not present

## 2020-05-20 DIAGNOSIS — Z87891 Personal history of nicotine dependence: Secondary | ICD-10-CM | POA: Diagnosis not present

## 2020-05-20 DIAGNOSIS — Z8616 Personal history of COVID-19: Secondary | ICD-10-CM | POA: Diagnosis not present

## 2020-05-20 DIAGNOSIS — R3914 Feeling of incomplete bladder emptying: Secondary | ICD-10-CM

## 2020-05-20 DIAGNOSIS — G35 Multiple sclerosis: Secondary | ICD-10-CM | POA: Diagnosis not present

## 2020-05-20 HISTORY — DX: Depression, unspecified: F32.A

## 2020-05-20 HISTORY — PX: CYSTOSCOPY WITH INSERTION OF UROLIFT: SHX6678

## 2020-05-20 SURGERY — CYSTOSCOPY WITH INSERTION OF UROLIFT
Anesthesia: General

## 2020-05-20 MED ORDER — MIDAZOLAM HCL 2 MG/2ML IJ SOLN
INTRAMUSCULAR | Status: AC
  Filled 2020-05-20: qty 2

## 2020-05-20 MED ORDER — ORAL CARE MOUTH RINSE: 15.0000 mL | Freq: Once | OROMUCOSAL | Status: AC

## 2020-05-20 MED ORDER — FENTANYL CITRATE (PF) 100 MCG/2ML IJ SOLN
INTRAMUSCULAR | Status: AC
  Filled 2020-05-20: qty 2

## 2020-05-20 MED ORDER — MIDAZOLAM HCL 2 MG/2ML IJ SOLN
INTRAMUSCULAR | Status: DC | PRN
  Administered 2020-05-20: 2 mg via INTRAVENOUS

## 2020-05-20 MED ORDER — CHLORHEXIDINE GLUCONATE 0.12 % MT SOLN
OROMUCOSAL | Status: AC
  Filled 2020-05-20: qty 15

## 2020-05-20 MED ORDER — PROPOFOL 10 MG/ML IV BOLUS
INTRAVENOUS | Status: AC
  Filled 2020-05-20: qty 40

## 2020-05-20 MED ORDER — CHLORHEXIDINE GLUCONATE 0.12 % MT SOLN
15.0000 mL | Freq: Once | OROMUCOSAL | Status: AC
  Administered 2020-05-20: 15 mL via OROMUCOSAL

## 2020-05-20 MED ORDER — LACTATED RINGERS IV SOLN: INTRAVENOUS | Status: DC

## 2020-05-20 MED ORDER — HYDROCODONE-ACETAMINOPHEN 5-325 MG PO TABS: 1.0000 | ORAL_TABLET | Freq: Four times a day (QID) | ORAL | 0 refills | Status: AC | PRN

## 2020-05-20 MED ORDER — PROPOFOL 500 MG/50ML IV EMUL
INTRAVENOUS | Status: DC | PRN
  Administered 2020-05-20: 120 ug/kg/min via INTRAVENOUS

## 2020-05-20 MED ORDER — CEFAZOLIN SODIUM-DEXTROSE 2-4 GM/100ML-% IV SOLN
2.0000 g | INTRAVENOUS | Status: AC
  Administered 2020-05-20: 2 g via INTRAVENOUS

## 2020-05-20 MED ORDER — CEFAZOLIN SODIUM-DEXTROSE 2-4 GM/100ML-% IV SOLN
INTRAVENOUS | Status: AC
  Filled 2020-05-20: qty 100

## 2020-05-20 MED ORDER — FENTANYL CITRATE (PF) 100 MCG/2ML IJ SOLN: 25.0000 ug | INTRAMUSCULAR | Status: DC | PRN

## 2020-05-20 MED ORDER — ONDANSETRON HCL 4 MG/2ML IJ SOLN: 4.0000 mg | Freq: Once | INTRAMUSCULAR | Status: DC | PRN

## 2020-05-20 MED ORDER — PROPOFOL 10 MG/ML IV BOLUS
INTRAVENOUS | Status: DC | PRN
  Administered 2020-05-20: 50 mg via INTRAVENOUS

## 2020-05-20 SURGICAL SUPPLY — 18 items
BAG DRAIN CYSTO-URO LG1000N (MISCELLANEOUS) ×2 IMPLANT
BRUSH SCRUB EZ  4% CHG (MISCELLANEOUS) ×1
BRUSH SCRUB EZ 4% CHG (MISCELLANEOUS) ×1 IMPLANT
GLOVE BIOGEL PI IND STRL 7.5 (GLOVE) ×1 IMPLANT
GLOVE BIOGEL PI INDICATOR 7.5 (GLOVE) ×1
GOWN STRL REUS W/ TWL LRG LVL3 (GOWN DISPOSABLE) ×1 IMPLANT
GOWN STRL REUS W/ TWL XL LVL3 (GOWN DISPOSABLE) ×1 IMPLANT
GOWN STRL REUS W/TWL LRG LVL3 (GOWN DISPOSABLE) ×2
GOWN STRL REUS W/TWL XL LVL3 (GOWN DISPOSABLE) ×2
KIT TURNOVER CYSTO (KITS) ×2 IMPLANT
MANIFOLD NEPTUNE II (INSTRUMENTS) ×2 IMPLANT
PACK CYSTO AR (MISCELLANEOUS) ×2 IMPLANT
SET CYSTO W/LG BORE CLAMP LF (SET/KITS/TRAYS/PACK) ×2 IMPLANT
SURGILUBE 2OZ TUBE FLIPTOP (MISCELLANEOUS) IMPLANT
SYSTEM ATC UROLIFT (Male Continence) ×2 IMPLANT
SYSTEM UROLIFT (Male Continence) ×8 IMPLANT
WATER STERILE IRR 1000ML POUR (IV SOLUTION) ×2 IMPLANT
WATER STERILE IRR 3000ML UROMA (IV SOLUTION) ×2 IMPLANT

## 2020-05-20 NOTE — Transfer of Care (Signed)
Immediate Anesthesia Transfer of Care Note  Patient: Allen Tapia  Procedure(s) Performed: CYSTOSCOPY WITH INSERTION OF UROLIFT (N/A )  Patient Location: PACU  Anesthesia Type:General  Level of Consciousness: drowsy and patient cooperative  Airway & Oxygen Therapy: Patient Spontanous Breathing and Patient connected to face mask oxygen  Post-op Assessment: Report given to RN and Post -op Vital signs reviewed and stable  Post vital signs: Reviewed and stable  Last Vitals:  Vitals Value Taken Time  BP 114/85 05/20/20 0856  Temp    Pulse 61 05/20/20 0858  Resp 15 05/20/20 0858  SpO2 100 % 05/20/20 0858  Vitals shown include unvalidated device data.  Last Pain:  Vitals:   05/20/20 0722  TempSrc: Temporal  PainSc: 0-No pain         Complications: No complications documented.

## 2020-05-20 NOTE — Discharge Instructions (Signed)

## 2020-05-20 NOTE — Op Note (Signed)
  Date of procedure:05/20/20  Preoperative diagnosis: 1. BPH with incomplete bladder emptying  Postoperative diagnosis: 1. Same  Procedure: 1. UroLift(total of 5 implants placed)  Surgeon: Nickolas Madrid, MD  Anesthesia: General  Complications:None  Intraoperative findings: 1.Moderate prostate, wide open channel at conclusion  MNO:[id number]  Specimens:None  Drains:None  Indication:Allen Tapia is a 1 year oldpatient with BPH and bothersome urinary symptoms of weak stream, incomplete emptying, and urinary frequency with urodynamic confirmed bladder outlet obstruction.    He has persistent bothersome symptoms despite multiple medications.. After reviewing the management options for treatment, they elected to proceed with the above surgical procedure(s). We have discussed the potential benefits and risks of the procedure, side effects of the proposed treatment, the likelihood of the patient achieving the goals of the procedure, and any potential problems that might occur during the procedure or recuperation. Informed consent has been obtained.  Description of procedure:  The patient was taken to the operating room and MAC was induced. SCDs were placed for DVT prophylaxis. The patient was placed in the dorsal lithotomy position, prepped and draped in the usual sterile fashion, and preoperative antibiotics were administered. A preoperative time-out was performed.  The visual obturator with the20French rigid cystoscope was used to intubate the meatus and a normal-appearing urethra was followed proximally into the bladder. The prostate was moderate in size with obstructing lateral lobes and a subtle median bar. Cystoscopy showed  mild bladder trabeculations but no suspicious lesions and the ureteral orifices were orthotopic bilaterally.  I started on the right side by placing a UroLift implant at the apex 1 cm distal to the bladder neck. An identical  implant was placed on the patient's left side. I then moved back to the verumontanum, and an additional implant was placed on the patient's right side just proximal to the Veru,andan identical procedure was performed on the left side. At this point I re-examined the channel with the visual obturator and there was residual obstructive tissue from the median bar.  The advanced tissue control UroLift device was used to place a fifth and final implant.  The median lobe was compressed and moved to the patient's left side opening an excellent channel.  The channel was examined with the visual obturator, and it was wide open from the verumontanum up to the bladder neck.  There was no significant bleeding.  A total of 5 UroLift implants were placed.  The bladder was left mildly distended, and the patient will need to void prior to discharge.  Disposition: Stable to PACU  Plan: Patient must void prior to discharge Continue Flomax x2 weeks Follow-up in 4 weeks forIPSS/PVR  Nickolas Madrid, MD 05/20/2020

## 2020-05-20 NOTE — H&P (Signed)
05/20/20 8:11 AM   Allen Tapia 10-08-1955 [id number]  CC: BPH  HPI: Briefly, 65 year old male with history of multiple sclerosis long-term this has been well managed.  He had a number of urinary symptoms including weak stream and urinary frequency.  IPSS score 19 with quality of life mixed.  Noticed some improvement on Flomax, but symptoms have worsened over the last year.  History of urodynamics that showed some unstable contractions and an obstructed flow pattern with max flow of 7 mL/s with an average flow of 1.2 mL/s and max detrusor pressure of 57 cmH2O.  He has had mildly elevated PVRs ranging around 200 mL in the past.  Prostate 33g with lateral lobe hypertrophy.  PMH: Past Medical History:  Diagnosis Date  . Asthma   . Atypical chest pain   . Barrett's esophagus   . Bilateral cataracts   . Chicken pox   . Chronic constipation   . Coronary artery disease 12/20/2010  . Depression   . Foreign body (FB) in soft tissue   . Former tobacco use   . GERD (gastroesophageal reflux disease)   . History of 2019 novel coronavirus disease (COVID-19) 04/07/2019  . Hyperlipidemia   . Hyperplastic colon polyp   . Ischemic heart disease 12/20/2010  . MS (multiple sclerosis) ([place])   . Shingles   . Tubular adenoma of colon 08/23/2016    Surgical History: Past Surgical History:  Procedure Laterality Date  . ADENOIDECTOMY  1960  . APPENDECTOMY  1979  . ARTHROPLASTY Right 01/02/2011   partial meniscectomy   . CARDIAC CATHETERIZATION    . CATARACT EXTRACTION, BILATERAL  03/12/2019  . COLONOSCOPY  02/05/2008  . COLONOSCOPY WITH PROPOFOL N/A 08/23/2016   Procedure: COLONOSCOPY WITH PROPOFOL;  Surgeon: Lollie Sails, MD;  Location: [facility] ENDOSCOPY;  Service: Endoscopy;  Laterality: N/A;  . COLONOSCOPY WITH PROPOFOL N/A 04/29/2020   Procedure: COLONOSCOPY WITH PROPOFOL;  Surgeon: Lesly Rubenstein, MD;  Location: [facility] ENDOSCOPY;  Service: Endoscopy;  Laterality: N/A;  .  ESOPHAGOGASTRODUODENOSCOPY (EGD) WITH PROPOFOL N/A 08/23/2016   Procedure: ESOPHAGOGASTRODUODENOSCOPY (EGD) WITH PROPOFOL;  Surgeon: Lollie Sails, MD;  Location: [facility] ENDOSCOPY;  Service: Endoscopy;  Laterality: N/A;  . ESOPHAGOGASTRODUODENOSCOPY (EGD) WITH PROPOFOL N/A 04/29/2020   Procedure: ESOPHAGOGASTRODUODENOSCOPY (EGD) WITH PROPOFOL;  Surgeon: Lesly Rubenstein, MD;  Location: [facility] ENDOSCOPY;  Service: Endoscopy;  Laterality: N/A;  . ESOPHAGOGASTRODUODENOSCOPY ENDOSCOPY  02/05/2008  . EYE SURGERY    . eye trauma  1970   piece of metal to eye removed in clinic  . FRACTURE SURGERY Right    wrist   . FRACTURE SURGERY Right    knee; partial meniscectomy  . right wrist surgery    . SEPTOPLASTY    . TONSILLECTOMY  02/05/2008     Family History: Family History  Problem Relation Age of Onset  . Heart disease Mother   . Glaucoma Mother   . Irregular heart beat Mother   . Cardiomyopathy Mother   . Colon polyps Mother   . Cataracts Mother   . Hypertension Father   . Heart disease Father   . Cancer Father   . Esophageal cancer Cousin     Social History:  reports that he quit smoking about 13 years ago. His smoking use included cigarettes. He has a 20.00 pack-year smoking history. He has never used smokeless tobacco. He reports that he does not drink alcohol and does not use drugs.  Physical Exam: BP 122/77   Pulse 68  Temp 97.7 F (36.5 C) (Temporal)   Resp 18   Ht 6' (1.829 m)   Wt 93 kg   SpO2 96%   BMI 27.81 kg/m    Constitutional:  Alert and oriented, No acute distress. Cardiovascular: RRR Respiratory: CTA bilaterally GI: Abdomen is soft, nontender, nondistended, no abdominal masses  Laboratory Data: Culture 2/18 4k streptococcus, suspect contaminant  Assessment & Plan:   We had a long conversation about the differences between HOLEP and UroLift, and the risks and benefits.  We discussed that HOLEP removes a large amount of tissue, and has a longer  recovery time with a low risk of long-term incontinence.  I would be concerned with his history of MS that if his MS acutely worsened he could have worsening frequency reviewed incontinence.  I think a better option for him is UroLift which is least invasive and will likely improve his frequency and weak stream.  We also discussed that in the future if he had worsening obstructive symptoms could consider HOLEP if symptoms are refractory to UroLift.  He would like to pursue UroLift which I think is very reasonable.  UroLift today  Nickolas Madrid, MD 05/20/2020  [place] [address] [place], [place] [address] [phone]

## 2020-05-20 NOTE — Anesthesia Preprocedure Evaluation (Signed)
Anesthesia Evaluation  Patient identified by MRN, date of birth, ID band Patient awake    Reviewed: Allergy & Precautions, NPO status , Patient's Chart, lab work & pertinent test results  History of Anesthesia Complications Negative for: history of anesthetic complications  Airway Mallampati: II       Dental   Pulmonary neg sleep apnea, neg COPD, Not current smoker, former smoker,  Covid x 3, resolved, still using inhalers          Cardiovascular (-) hypertension+ CAD (70% blockage 12 years ago)  (-) Past MI and (-) CHF (-) dysrhythmias (bradycardia with MS med, no problems in years) (-) Valvular Problems/Murmurs     Neuro/Psych neg Seizures Depression  Neuromuscular disease (MS - weakness all over, ?foot drop, decreased sensation)    GI/Hepatic Neg liver ROS, GERD  Medicated and Controlled,  Endo/Other  neg diabetes  Renal/GU negative Renal ROS     Musculoskeletal   Abdominal   Peds  Hematology   Anesthesia Other Findings   Reproductive/Obstetrics                             Anesthesia Physical Anesthesia Plan  ASA: III  Anesthesia Plan: General   Post-op Pain Management:    Induction: Intravenous  PONV Risk Score and Plan: 2 and Propofol infusion and TIVA  Airway Management Planned: Nasal Cannula  Additional Equipment:   Intra-op Plan:   Post-operative Plan:   Informed Consent: I have reviewed the patients History and Physical, chart, labs and discussed the procedure including the risks, benefits and alternatives for the proposed anesthesia with the patient or authorized representative who has indicated his/her understanding and acceptance.       Plan Discussed with:   Anesthesia Plan Comments:         Anesthesia Quick Evaluation

## 2020-05-20 NOTE — Anesthesia Postprocedure Evaluation (Signed)
Anesthesia Post Note  Patient: Allen Tapia  Procedure(s) Performed: CYSTOSCOPY WITH INSERTION OF UROLIFT (N/A )  Patient location during evaluation: PACU Anesthesia Type: General Level of consciousness: awake and alert and oriented Pain management: pain level controlled Vital Signs Assessment: post-procedure vital signs reviewed and stable Respiratory status: spontaneous breathing, nonlabored ventilation and respiratory function stable Cardiovascular status: blood pressure returned to baseline and stable Postop Assessment: no signs of nausea or vomiting Anesthetic complications: no   No complications documented.   Last Vitals:  Vitals:   05/20/20 0941 05/20/20 1020  BP: 137/85 132/83  Pulse: 60 65  Resp: 16 16  Temp: 36.5 C 36.4 C  SpO2: 99% 95%    Last Pain:  Vitals:   05/20/20 1020  TempSrc: Temporal  PainSc: 0-No pain                 Camille Thau

## 2020-05-22 ENCOUNTER — Encounter: Payer: Self-pay | Admitting: Cardiology

## 2020-05-23 ENCOUNTER — Other Ambulatory Visit: Payer: Self-pay

## 2020-05-23 ENCOUNTER — Encounter: Payer: Self-pay | Admitting: Podiatry

## 2020-05-23 ENCOUNTER — Ambulatory Visit (INDEPENDENT_AMBULATORY_CARE_PROVIDER_SITE_OTHER): Payer: Medicare Other | Admitting: Podiatry

## 2020-05-23 DIAGNOSIS — L603 Nail dystrophy: Secondary | ICD-10-CM | POA: Diagnosis not present

## 2020-05-23 DIAGNOSIS — I2510 Atherosclerotic heart disease of native coronary artery without angina pectoris: Secondary | ICD-10-CM

## 2020-05-23 NOTE — Progress Notes (Signed)
He presents today to discuss his tenotomy he also is concerned about fungus in the great toe and the second toe of the discoloration of those toes.  States that he lost a portion of his nail hallux left about a month ago leaving only a small sliver present.  He wants to know what to do about that.  Objective: Vital signs are stable he is alert oriented x3.  Pulses are palpable.  Hallux nails bilaterally do demonstrate a nail dystrophy possible onychomycosis with thickening of the nails brittle nails and near complete loss of the hallux nail left some dead components still present.  Contracted digits secondary to his MS there is resulting in some distal clavi but at this point he does not want to have his tenotomy performed.  Assessment: Mild hammertoe deformities right greater than left and nail dystrophy.  Plan: Take a sample of the nails and skin today to be sent for pathologic evaluation follow-up with him in about a month to see how he is doing.Marland Kitchen

## 2020-05-31 ENCOUNTER — Encounter: Payer: Self-pay | Admitting: *Deleted

## 2020-06-01 ENCOUNTER — Ambulatory Visit
Admission: RE | Admit: 2020-06-01 | Discharge: 2020-06-01 | Disposition: A | Discharge status: Home / Self Care | Payer: Medicare Other | Source: Ambulatory Visit | Attending: Family Medicine | Admitting: Family Medicine

## 2020-06-01 ENCOUNTER — Other Ambulatory Visit: Payer: Self-pay

## 2020-06-01 VITALS — BP 144/78 | HR 71 | Temp 98.4°F | Resp 18

## 2020-06-01 DIAGNOSIS — B029 Zoster without complications: Secondary | ICD-10-CM | POA: Diagnosis not present

## 2020-06-01 MED ORDER — VALACYCLOVIR HCL 1 G PO TABS: 1000.0000 mg | ORAL_TABLET | Freq: Three times a day (TID) | ORAL | 0 refills | Status: DC

## 2020-06-01 NOTE — ED Provider Notes (Signed)
Allen Tapia    CSN: [id number] Arrival date & time: 06/01/20  0849      History   Chief Complaint Chief Complaint  Patient presents with  . Rash    Rt upper back    HPI Allen Tapia is a 65 y.o. male.   Patient is a 65 year old male presents today with rash.  Rash is located to the right upper flank/back area.  This started approximately 2 days ago.  The rash is mildly painful.  History of shingles, chickenpox and had shingles vaccine.  Denies any itching to the area.   Denies any fever, joint pain. Denies any recent changes in lotions, detergents, foods or other possible irritants. No recent travel. Nobody else at home has the rash. Patient has been outside but denies any contact with plants or insects. No new foods or medications.        Past Medical History:  Diagnosis Date  . Asthma   . Atypical chest pain   . Barrett's esophagus   . Bilateral cataracts   . Chicken pox   . Chronic constipation   . Coronary artery disease 12/20/2010  . Depression   . Foreign body (FB) in soft tissue   . Former tobacco use   . GERD (gastroesophageal reflux disease)   . History of 2019 novel coronavirus disease (COVID-19) 04/07/2019  . Hyperlipidemia   . Hyperplastic colon polyp   . Ischemic heart disease 12/20/2010  . MS (multiple sclerosis) ([place])   . Shingles   . Shingles   . Tubular adenoma of colon 08/23/2016    Patient Active Problem List   Diagnosis Date Noted  . Mild intermittent asthma 12/07/2019  . Pure hypercholesterolemia 05/27/2019  . Status post laser cataract surgery, left 02/11/2019  . Barrett's esophagus 10/10/2016  . Chronic constipation 10/10/2016  . 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 Encounter for long-term (current) use of other medications 06/08/2013  . Dyspnea, unspecified 04/07/2012  . Multiple sclerosis ([place]) 01/31/2012  . Acid  reflux 12/20/2010  . CAD (coronary artery disease) 12/20/2010  . Depression 12/20/2010  . Family history of heart disease 12/20/2010  . Foreign body 12/20/2010  . Hyperlipemia 12/20/2010  . Tobacco abuse 12/20/2010    Past Surgical History:  Procedure Laterality Date  . ADENOIDECTOMY  1960  . APPENDECTOMY  1979  . ARTHROPLASTY Right 01/02/2011   partial meniscectomy   . CARDIAC CATHETERIZATION    . CATARACT EXTRACTION, BILATERAL  03/12/2019  . COLONOSCOPY  02/05/2008  . COLONOSCOPY WITH PROPOFOL N/A 08/23/2016   Procedure: COLONOSCOPY WITH PROPOFOL;  Surgeon: Lollie Sails, MD;  Location: [facility] ENDOSCOPY;  Service: Endoscopy;  Laterality: N/A;  . COLONOSCOPY WITH PROPOFOL N/A 04/29/2020   Procedure: COLONOSCOPY WITH PROPOFOL;  Surgeon: Lesly Rubenstein, MD;  Location: [facility] ENDOSCOPY;  Service: Endoscopy;  Laterality: N/A;  . CYSTOSCOPY WITH INSERTION OF UROLIFT N/A 05/20/2020   Procedure: CYSTOSCOPY WITH INSERTION OF UROLIFT;  Surgeon: Billey Co, MD;  Location: [facility] ORS;  Service: Urology;  Laterality: N/A;  . ESOPHAGOGASTRODUODENOSCOPY (EGD) WITH PROPOFOL N/A 08/23/2016   Procedure: ESOPHAGOGASTRODUODENOSCOPY (EGD) WITH PROPOFOL;  Surgeon: Lollie Sails, MD;  Location: [facility] ENDOSCOPY;  Service: Endoscopy;  Laterality: N/A;  . ESOPHAGOGASTRODUODENOSCOPY (EGD) WITH PROPOFOL N/A 04/29/2020   Procedure: ESOPHAGOGASTRODUODENOSCOPY (EGD) WITH PROPOFOL;  Surgeon: Lesly Rubenstein, MD;  Location: [facility] ENDOSCOPY;  Service: Endoscopy;  Laterality: N/A;  . ESOPHAGOGASTRODUODENOSCOPY  ENDOSCOPY  02/05/2008  . EYE SURGERY    . eye trauma  1970   piece of metal to eye removed in clinic  . FRACTURE SURGERY Right    wrist   . FRACTURE SURGERY Right    knee; partial meniscectomy  . right wrist surgery    . SEPTOPLASTY    . TONSILLECTOMY  02/05/2008       Home Medications    Prior to Admission medications   Medication Sig Start Date End Date Taking? Authorizing Provider   valACYclovir (VALTREX) 1000 MG tablet Take 1 tablet (1,000 mg total) by mouth 3 (three) times daily. 06/01/20  Yes Atom Solivan A, NP  acetaminophen (TYLENOL) 500 MG tablet Take 1,000 mg by mouth every 6 (six) hours as needed for moderate pain.    [provider]  albuterol (VENTOLIN HFA) 108 (90 Base) MCG/ACT inhaler Inhale 2 puffs into the lungs every 6 (six) hours as needed for wheezing or shortness of breath. 12/08/19   Martyn Ehrich, NP  amphetamine-dextroamphetamine (ADDERALL XR) 25 MG 24 hr capsule Take 25 mg by mouth daily as needed (focus).    [provider]  aspirin EC 81 MG tablet Take 81 mg by mouth daily.    [provider]  baclofen (LIORESAL) 20 MG tablet Take 20 mg by mouth 3 (three) times daily.    [provider]  budesonide-formoterol (SYMBICORT) 160-4.5 MCG/ACT inhaler Inhale 2 puffs into the lungs in the morning and at bedtime. 03/15/20   Martyn Ehrich, NP  buPROPion (WELLBUTRIN XL) 150 MG 24 hr tablet Take 150 mg by mouth daily. 12/16/18   [provider]  cholecalciferol (VITAMIN D) 1000 units tablet Take 2,000 Units by mouth daily.    [provider]  diphenhydrAMINE (BENADRYL) 25 MG tablet Take 25 mg by mouth at bedtime.    [provider]  Fingolimod HCl 0.5 MG CAPS Take 0.5 mg by mouth every morning.    [provider]  fluorouracil (EFUDEX) 5 % cream Apply 1 application topically daily. 05/01/19   [provider]  gabapentin (NEURONTIN) 300 MG capsule Take 300 mg by mouth 2 (two) times daily.  12/26/18   [provider]  ibuprofen (ADVIL) 800 MG tablet Take 800 mg by mouth every 6 (six) hours as needed.    [provider]  montelukast (SINGULAIR) 10 MG tablet Take 1 tablet (10 mg total) by mouth at bedtime. 12/01/19 02/29/20  Garner Nash, DO  omeprazole (PRILOSEC) 40 MG capsule Take 40 mg by mouth daily. 03/22/19   [provider]  rosuvastatin (CRESTOR) 20 MG  tablet Take 1 tablet (20 mg total) by mouth daily. 05/09/20 05/04/21  Buford Dresser, MD  Spacer/Aero-Holding Chambers (AEROCHAMBER MV) inhaler Use as instructed 06/11/18   Icard, Octavio Graves, DO  tamsulosin (FLOMAX) 0.4 MG CAPS capsule TAKE 1 CAPSULE DAILY Patient taking differently: Take 0.4 mg by mouth daily. 04/29/20   Billey Co, MD  traZODone (DESYREL) 100 MG tablet Take 100 mg by mouth at bedtime.    [provider]    Family History Family History  Problem Relation Age of Onset  . Heart disease Mother   . Glaucoma Mother   . Irregular heart beat Mother   . Cardiomyopathy Mother   . Colon polyps Mother   . Cataracts Mother   . Hypertension Father   . Heart disease Father   . Cancer Father   . Esophageal cancer Cousin  Social History Social History   Tobacco Use  . Smoking status: Former Smoker    Packs/day: 1.00    Years: 20.00    Pack years: 20.00    Types: Cigarettes    Quit date: 09/23/2006    Years since quitting: 13.6  . Smokeless tobacco: Never Used  Vaping Use  . Vaping Use: Never used  Substance Use Topics  . Alcohol use: No  . Drug use: No     Allergies   Patient has no known allergies.   Review of Systems Review of Systems   Physical Exam Triage Vital Signs ED Triage Vitals  Enc Vitals Group     BP 06/01/20 0901 (!) 144/78     Pulse Rate 06/01/20 0901 71     Resp 06/01/20 0901 18     Temp 06/01/20 0901 98.4 F (36.9 C)     Temp Source 06/01/20 0901 Oral     SpO2 06/01/20 0901 94 %     Weight --      Height --      Head Circumference --      Peak Flow --      Pain Score 06/01/20 0903 1     Pain Loc --      Pain Edu? --      Excl. in [place]? --    No data found.  Updated Vital Signs BP (!) 144/78 (BP Location: Left Arm)   Pulse 71   Temp 98.4 F (36.9 C) (Oral)   Resp 18   SpO2 94%   Visual Acuity Right Eye Distance:   Left Eye Distance:   Bilateral Distance:    Right Eye Near:   Left Eye Near:     Bilateral Near:     Physical Exam Vitals and nursing note reviewed.  Constitutional:      Appearance: Normal appearance.  HENT:     Head: Normocephalic and atraumatic.  Eyes:     Conjunctiva/sclera: Conjunctivae normal.  Pulmonary:     Effort: Pulmonary effort is normal.  Musculoskeletal:        General: Normal range of motion.     Cervical back: Normal range of motion.  Skin:    General: Skin is warm and dry.     Findings: Rash present. Rash is papular and pustular.          Comments: Erythematous,clustered,  pustular rash to the right upper back, flank area.   Neurological:     Mental Status: He is alert.  Psychiatric:        Mood and Affect: Mood normal.      UC Treatments / Results  Labs (all labs ordered are listed, but only abnormal results are displayed) Labs Reviewed - No data to display  EKG   Radiology No results found.  Procedures Procedures (including critical care time)  Medications Ordered in UC Medications - No data to display  Initial Impression / Assessment and Plan / UC Course  I have reviewed the triage vital signs and the nursing notes.  Pertinent labs & imaging results that were available during my care of the patient were reviewed by me and considered in my medical decision making (see chart for details).     Herpes zoster  Treating for shingles based on rash appearance.  Treating with valtrex Tylenol and ibuprofen as needed.  Follow up as needed for continued or worsening symptoms  Final Clinical Impressions(s) / UC Diagnoses   Final diagnoses:  Herpes zoster without complication  Discharge Instructions     Treating you for shingles Take the medicine as prescribed Tylenol and ibuprofen for pain as needed.  Follow up as needed for continued or worsening symptoms     ED Prescriptions    Medication Sig Dispense Auth. Provider   valACYclovir (VALTREX) 1000 MG tablet Take 1 tablet (1,000 mg total) by mouth 3 (three)  times daily. 21 tablet Yamilette Garretson A, NP     PDMP not reviewed this encounter.   Loura Halt A, NP 06/01/20 1004

## 2020-06-01 NOTE — ED Triage Notes (Signed)
Pt presents today with c/o of rash to right upper back x 2 days, painful. Recently dx with shingles to face (resolved).

## 2020-06-01 NOTE — Discharge Instructions (Addendum)
Treating you for shingles Take the medicine as prescribed Tylenol and ibuprofen for pain as needed.  Follow up as needed for continued or worsening symptoms

## 2020-06-05 DIAGNOSIS — M1711 Unilateral primary osteoarthritis, right knee: Secondary | ICD-10-CM | POA: Insufficient documentation

## 2020-06-06 ENCOUNTER — Other Ambulatory Visit: Payer: Self-pay

## 2020-06-06 ENCOUNTER — Ambulatory Visit (INDEPENDENT_AMBULATORY_CARE_PROVIDER_SITE_OTHER): Payer: Medicare Other

## 2020-06-06 ENCOUNTER — Encounter: Payer: Self-pay | Admitting: Primary Care

## 2020-06-06 ENCOUNTER — Ambulatory Visit (INDEPENDENT_AMBULATORY_CARE_PROVIDER_SITE_OTHER): Payer: Medicare Other | Admitting: Primary Care

## 2020-06-06 VITALS — BP 124/76 | HR 75 | Temp 98.0°F | Ht 72.0 in | Wt 205.0 lb

## 2020-06-06 DIAGNOSIS — J4520 Mild intermittent asthma, uncomplicated: Secondary | ICD-10-CM

## 2020-06-06 DIAGNOSIS — J42 Unspecified chronic bronchitis: Secondary | ICD-10-CM

## 2020-06-06 MED ORDER — MONTELUKAST SODIUM 10 MG PO TABS: 10.0000 mg | ORAL_TABLET | Freq: Every day | ORAL | 1 refills | Status: DC

## 2020-06-06 NOTE — Assessment & Plan Note (Addendum)
Stable interval; Winter exacerbates his asthma symptoms. Uses SABA x2/week.  ACT 23.  Has not required oral steriods or antibiotics. He has productive upper airway cough with clear mucus with intermittent wheezing. Lungs were clear on exam but we will check CXR and sputum culture. PND could be contributing. FU in 1 year or sooner if needed.   Recommendations:  - Continue Symbicort 114mcg 1-2 puffs twice daily - Use albuterol 2 puffs every 6 hours as needed for breakthrough shortness of breath/wheezing - Continue Montelukast 10mg  at bedtime (refill provided) - Add flonase nasal spray once daily, [place] nasal spray twice daily and Mucinex 600mg  1-2 times a day for cough

## 2020-06-06 NOTE — Progress Notes (Signed)
@Patient  ID: Allen Tapia, male    DOB: [birth year], 65 y.o.   MRN: [id number]  Chief Complaint  Patient presents with  . Follow-up    Pt states he has been doing okay since last visit. States that he has had phlegm in his throat that he has a hard time trying to get it out and will have to clear throat multiple times during the day to get it out.    Referring provider: Derinda Late, MD   HPI: 65 year old male, former smoker quit 2008 (20 pack year hx). PMH significant for mild intermittent asthma, MS, barrett's esophagus, CAD, tobacco abuse. Patient of Dr. Valeta Harms, last seen on 03/26/19. Maintained on Symbicort 160, decreased to 1 puff twice daily with spacer. Continue Montelukast, if symptoms continues consider additional of daily antihistamine.   Previous [facility] pulmonary encounters:  Patient complains of cough for the past several weeks. He has episodic events since then. He took a group of teenagers to the beach and got sick for a few days with URI symptoms the cough has been persistent. Former smoker, quit 20 years ago, smoked for 20 years, for about 1 ppd. He had pulmonary function tests in 2009. 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 He notices that his cough is worse when its cold. He has had allergies as a child. He had significant allergies as a kid. Never hospitalized as a child for respiratory symptoms. He does have a dry mouth. He does take trazadone and benadryl at bedtime. No eczema. He was dx with MS, dx in 2012. His gait has been the most difficult. He a swallow study 5 years ago. He has had episodes of cough/choking recently.   OV 12/30/2018: Doing well since last office visit.  He used Symbicort for a short period of time.  His symptoms abated.  He did well throughout the summer with no issues.  This fall noticed with the change in weather developed chest tightness with wheezing again.  Had an exacerbation a few weeks ago.  He was called to set up his PFT  appointment at this time noticed that he was still having much difficulty breathing.  He did restart his Symbicort samples that he was given in the past.  After about a week of Symbicort his symptoms significantly improved no longer had dyspnea on exertion shortness of breath chest tightness or wheezing.  At this time he is doing much better understands that he needs to be on medication daily.  He has restarted it does need refills of this medication.  OV 03/26/2019: Patient here today for follow-up from her's symptoms of cough and shortness of breath.  He was restarted on Symbicort.  His symptoms have completely dissipated.  No longer having chest tightness no longer having wheezing.  His cough is also gone.  He is doing much better.  Using his inhalers regularly with spacer.  He has occasionally noticed some hoarseness of voice.  Overall respiratory symptoms are better.  Denies hemoptysis shortness of breath chest pain chest tightness or wheezing.  12/07/2019 Patient presents today for 6-8 month follow-up/ mild intermittent asthma. He had a mild case of Covid-19 in January 2021. No acute complaints today. He is complaints with Symbicort and Singulair.   His appoint was delays s/t scheduled with covid. When he feels well he does not have nay wheezing. He recently noticed increased wheezing symptoms 3-4 weeks ago. No significant cough. He is currently doing some home renovations outside including power  washing. He is moving grass.  He is currently back on 2 puffs Symbicort 160 twice a day. He has missed an occasional evening dose a couple times a week. Using rescue inhaler mostly twice a day. He is compliant with Singulair. He has slight cough with little mucus production. Planning [place] mission trip in December. He has had both covid vaccines and is hopping to get boost shot.   06/06/2020 - interm hx  Patient presents today for 6 month follow-up. He is doing well, no acute complaints except for  congested upper airway cough. He has been taking Symbicort 1-2 puffs twice a day.  He needs full dose of inhaler more in winter time. On average he uses rescue inhaler 2 times a week for intermittent wheezing. He has a frequent cough several times a day, he gets up a lot of clear mucus. He is taking Singulair 10mg  at bedtime. He recently traveled to [place] this past year for mission trip. ACT 23.    No Known Allergies  Immunization History  Administered Date(s) Administered  . Influenza, High Dose Seasonal PF 12/25/2019  . Influenza,inj,Quad PF,6+ Mos 01/18/2017, 01/21/2018, 01/09/2019  . Influenza-Unspecified 12/24/2018  . PFIZER(Purple Top)SARS-COV-2 Vaccination 06/15/2019, 07/13/2019  . Pneumococcal Conjugate-13 01/21/2018  . Pneumococcal Polysaccharide-23 07/23/2013, 01/29/2019  . Td 09/23/2009    Past Medical History:  Diagnosis Date  . Asthma   . Atypical chest pain   . Barrett's esophagus   . Bilateral cataracts   . Chicken pox   . Chronic constipation   . Coronary artery disease 12/20/2010  . Depression   . Foreign body (FB) in soft tissue   . Former tobacco use   . GERD (gastroesophageal reflux disease)   . History of 2019 novel coronavirus disease (COVID-19) 04/07/2019  . Hyperlipidemia   . Hyperplastic colon polyp   . Ischemic heart disease 12/20/2010  . MS (multiple sclerosis) ([place])   . Shingles   . Shingles   . Tubular adenoma of colon 08/23/2016    Tobacco History: Social History   Tobacco Use  Smoking Status Former Smoker  . Packs/day: 1.00  . Years: 20.00  . Pack years: 20.00  . Types: Cigarettes  . Start date: 7  . Quit date: 09/23/2006  . Years since quitting: 13.7  Smokeless Tobacco Never Used   Counseling given: Not Answered   Outpatient Medications Prior to Visit  Medication Sig Dispense Refill  . acetaminophen (TYLENOL) 500 MG tablet Take 1,000 mg by mouth every 6 (six) hours as needed for moderate pain.    Marland Kitchen albuterol (VENTOLIN  HFA) 108 (90 Base) MCG/ACT inhaler Inhale 2 puffs into the lungs every 6 (six) hours as needed for wheezing or shortness of breath. 24 g 3  . amphetamine-dextroamphetamine (ADDERALL XR) 25 MG 24 hr capsule Take 25 mg by mouth daily as needed (focus).    Marland Kitchen aspirin EC 81 MG tablet Take 81 mg by mouth daily.    . baclofen (LIORESAL) 20 MG tablet Take 20 mg by mouth 3 (three) times daily.    . budesonide-formoterol (SYMBICORT) 160-4.5 MCG/ACT inhaler Inhale 2 puffs into the lungs in the morning and at bedtime. 3 each 3  . buPROPion (WELLBUTRIN XL) 150 MG 24 hr tablet Take 150 mg by mouth daily.    . cholecalciferol (VITAMIN D) 1000 units tablet Take 2,000 Units by mouth daily.    . diphenhydrAMINE (BENADRYL) 25 MG tablet Take 25 mg by mouth at bedtime.    . Fingolimod HCl 0.5  MG CAPS Take 0.5 mg by mouth every morning.    . fluorouracil (EFUDEX) 5 % cream Apply 1 application topically daily.    Marland Kitchen gabapentin (NEURONTIN) 300 MG capsule Take 300 mg by mouth 2 (two) times daily.     Marland Kitchen ibuprofen (ADVIL) 800 MG tablet Take 800 mg by mouth every 6 (six) hours as needed.    Marland Kitchen omeprazole (PRILOSEC) 40 MG capsule Take 40 mg by mouth daily.    . rosuvastatin (CRESTOR) 20 MG tablet Take 1 tablet (20 mg total) by mouth daily. 90 tablet 3  . Spacer/Aero-Holding Chambers (AEROCHAMBER MV) inhaler Use as instructed 1 each 0  . tamsulosin (FLOMAX) 0.4 MG CAPS capsule TAKE 1 CAPSULE DAILY (Patient taking differently: Take 0.4 mg by mouth daily.) 90 capsule 1  . traZODone (DESYREL) 100 MG tablet Take 100 mg by mouth at bedtime.    . valACYclovir (VALTREX) 1000 MG tablet Take 1 tablet (1,000 mg total) by mouth 3 (three) times daily. 21 tablet 0  . montelukast (SINGULAIR) 10 MG tablet Take 1 tablet (10 mg total) by mouth at bedtime. 90 tablet 1   No facility-administered medications prior to visit.   Review of Systems  Review of Systems  HENT: Positive for congestion.   Respiratory: Positive for cough and wheezing.  Negative for chest tightness and shortness of breath.   Cardiovascular: Negative.     Physical Exam  BP 124/76 (BP Location: Right Arm, Cuff Size: Normal)   Pulse 75   Temp 98 F (36.7 C) (Temporal)   Ht 6' (1.829 m)   Wt 205 lb (93 kg)   SpO2 95% Comment: RA  BMI 27.80 kg/m  Physical Exam Constitutional:      Appearance: Normal appearance.  HENT:     Mouth/Throat:     Mouth: Mucous membranes are moist.     Pharynx: Oropharynx is clear.  Cardiovascular:     Rate and Rhythm: Normal rate and regular rhythm.  Pulmonary:     Effort: Pulmonary effort is normal.     Breath sounds: Normal breath sounds.     Comments: CTA Musculoskeletal:        General: Normal range of motion.     Cervical back: Normal range of motion and neck supple.  Lymphadenopathy:     Cervical: No cervical adenopathy.  Skin:    General: Skin is warm and dry.  Neurological:     General: No focal deficit present.     Mental Status: He is alert and oriented to person, place, and time. Mental status is at baseline.  Psychiatric:        Mood and Affect: Mood normal.        Behavior: Behavior normal.        Thought Content: Thought content normal.        Judgment: Judgment normal.      Lab Results:  CBC    Component Value Date/Time   WBC 3.9 08/23/2016 0848   RBC 4.86 08/23/2016 0848   HGB 14.9 08/23/2016 0848   HCT 44.1 08/23/2016 0848   PLT 215 08/23/2016 0848   MCV 90.8 08/23/2016 0848   MCH 30.7 08/23/2016 0848   MCHC 33.8 08/23/2016 0848   RDW 14.2 08/23/2016 0848   LYMPHSABS 0.1 (L) 08/23/2016 0848   MONOABS 0.6 08/23/2016 0848   EOSABS 0.2 08/23/2016 0848   BASOSABS 0.0 08/23/2016 0848    BMET No results found for: NA, K, CL, CO2, GLUCOSE, BUN, CREATININE, CALCIUM, GFRNONAA, GFRAA  BNP No results found for: BNP  ProBNP No results found for: PROBNP  Imaging: No results found.   Assessment & Plan:   Mild intermittent asthma Stable interval; Winter exacerbates his asthma  symptoms. Uses SABA x2/week.  ACT 23.  Has not required oral steriods or antibiotics. He has productive upper airway cough with clear mucus with intermittent wheezing. Lungs were clear on exam but we will check CXR and sputum culture. PND could be contributing. FU in 1 year or sooner if needed.   Recommendations:  - Continue Symbicort 152mcg 1-2 puffs twice daily - Use albuterol 2 puffs every 6 hours as needed for breakthrough shortness of breath/wheezing - Continue Montelukast 10mg  at bedtime (refill provided) - Add flonase nasal spray once daily, Ocean nasal spray twice daily and Mucinex 600mg  1-2 times a day for cough       Martyn Ehrich, NP 06/06/2020

## 2020-06-06 NOTE — Patient Instructions (Signed)
Pleasure meeting you today Allen Tapia  Recommendations: Continue Symbicort 1-2 puffs twice daily Use albuterol 2 puffs every 6 hours as needed for breakthrough shortness of breath/wheezing Continue Montelukast 10mg  at bedtime Add flonase nasal spray once daily and Ocean nasal spray twice daily  Mucinex 600mg  1-2 times a day for cough (expectorant)  Orders: CXR Sputum culture   Follow-up 1 year with Dr. Valeta Harms or Eustaquio Maize NP

## 2020-06-13 ENCOUNTER — Telehealth: Payer: Self-pay

## 2020-06-13 MED ORDER — NEOMYCIN-POLYMYXIN-HC 3.5-10000-1 OT SOLN: OTIC | 0 refills | Status: DC

## 2020-06-13 NOTE — Telephone Encounter (Signed)
Patient has been notified of resutls and Dr. Stephenie Acres recommendations.  New script for Cortisporin has been sent to pharmacy

## 2020-06-13 NOTE — Progress Notes (Signed)
Thanks for seeing him  Garner Nash, DO [facility] 06/13/2020 12:52 PM

## 2020-06-13 NOTE — Telephone Encounter (Signed)
-----   Message from Garrel Ridgel, [place] sent at 06/01/2020  7:07 AM EST ----- Negative for fungus Positive for chronic trauma Subungual bacterial colonization which may be treated with Cortisporin otic twice daily to the end of the nail.  Also may treat with urea such as gel toe which may be ordered over-the-counter for the thicker nail.

## 2020-06-15 ENCOUNTER — Other Ambulatory Visit: Payer: Self-pay

## 2020-06-15 ENCOUNTER — Ambulatory Visit (INDEPENDENT_AMBULATORY_CARE_PROVIDER_SITE_OTHER): Payer: Medicare Other | Admitting: Urology

## 2020-06-15 ENCOUNTER — Encounter: Payer: Self-pay | Admitting: Urology

## 2020-06-15 VITALS — BP 117/69 | HR 71 | Ht 74.0 in | Wt 195.0 lb

## 2020-06-15 DIAGNOSIS — N401 Enlarged prostate with lower urinary tract symptoms: Secondary | ICD-10-CM

## 2020-06-15 DIAGNOSIS — N138 Other obstructive and reflux uropathy: Secondary | ICD-10-CM

## 2020-06-15 DIAGNOSIS — N3281 Overactive bladder: Secondary | ICD-10-CM | POA: Diagnosis not present

## 2020-06-15 NOTE — Progress Notes (Signed)
   06/15/2020 10:02 AM   Allen Tapia 09-17-1955 [id number]  Reason for visit: Follow up OAB, BPH status post UroLift  HPI: 65 year old male with long-term well-controlled multiple sclerosis as well as a long history of urinary symptoms.  He has a history of urodynamics showing an obstructed flow pattern with some unstable contractions, and mildly elevated PVRs ranging around 200 mL in the past.  His prostate measured 33 g with lateral lobe hypertrophy. He previously had had some mild improvement on Flomax. After extensive counseling, he opted to undergo a UroLift, and this was performed on 05/20/2020.    Overall, he feels his urination has improved significantly since surgery.  PVR is normal at 79 mL today for the first time.  When we originally met him he was voiding 27 times per day, after Flomax down to 17 times per day, and now it 10 times per day after UroLift and with Flomax discontinued.  He has nocturia 1-2 times per night that is not particularly bothersome.  IPSS score today is 16, with quality of life mostly satisfied.  We again discussed the overlap between BPH and OAB.  I recommended waiting another 2 to 3 months to see where his urinary symptoms settle out out after UroLift, and considering either Flomax or Myrbetriq/anticholinergic OAB medication at that time if he is persistently bothered by urinary frequency.  RTC 3 months with IPSS, PVR-> consider Flomax or OAB medication at that time if persistently bothersome frequency and normal PVR  Billey Co, MD  [facility] [address] [place], [name] [address] [phone]

## 2020-06-20 ENCOUNTER — Encounter: Payer: Medicare Other | Admitting: Podiatry

## 2020-09-14 ENCOUNTER — Ambulatory Visit (INDEPENDENT_AMBULATORY_CARE_PROVIDER_SITE_OTHER): Payer: Medicare Other | Admitting: Urology

## 2020-09-14 ENCOUNTER — Other Ambulatory Visit: Payer: Self-pay

## 2020-09-14 ENCOUNTER — Encounter: Payer: Self-pay | Admitting: Urology

## 2020-09-14 VITALS — BP 126/79 | HR 73 | Ht 72.0 in | Wt 185.0 lb

## 2020-09-14 DIAGNOSIS — N138 Other obstructive and reflux uropathy: Secondary | ICD-10-CM | POA: Diagnosis not present

## 2020-09-14 DIAGNOSIS — N3281 Overactive bladder: Secondary | ICD-10-CM | POA: Diagnosis not present

## 2020-09-14 DIAGNOSIS — N401 Enlarged prostate with lower urinary tract symptoms: Secondary | ICD-10-CM

## 2020-09-14 LAB — BLADDER SCAN AMB NON-IMAGING: Scan Result: 97

## 2020-09-14 NOTE — Progress Notes (Signed)
   09/14/2020 10:08 AM   Costella Hatcher December 07, 1955 [id number]  Reason for visit: Follow up OAB, BPH status post UroLift  HPI: 65 year old male with long-term well-controlled multiple sclerosis as well as a long history of urinary symptoms.  He has a history of urodynamics showing an obstructed flow pattern with some unstable contractions, and mildly elevated PVRs ranging around 200 mL in the past.  His prostate measured 33 g with lateral lobe hypertrophy. He previously had had some mild improvement on Flomax. After extensive counseling, he opted to undergo a UroLift, and this was performed on 05/20/2020.     Overall, he feels his urination has improved significantly since surgery.  PVR is again normal at 97 mL.  When we had urgently met he was voiding 25-30 times per day, now after UroLift is down to 10-15 times per day.  He still has some frequency during the day and nocturia 2-3 times overnight.  He resumed Flomax about a month ago with some mild improvement in the frequency.  We discussed the differences between BPH and OAB again, and I recommended trying the Myrbetriq 50 mg daily to see if this helps with some of his frequency.  Trial of Myrbetriq 50 mg x 1 month, okay to fill prescription if helpful and he will let us know in a month how things are going RTC 1 year PVR    Billey Co, [place] [address] [place], [place] [address] [phone]

## 2020-09-16 ENCOUNTER — Telehealth: Payer: Self-pay | Admitting: *Deleted

## 2020-09-16 NOTE — Telephone Encounter (Addendum)
Patient called to report he has been taking Myrbetriq, symptoms have improved with frequency. Concerned with urinary retention due to myrbetriq. Reviewed symptoms to be aware of-incomplete emptying, frequent urination in small amounts, difficulty starting the flow of urine, slow urine stream, urgent need to urinate, or any pain. Patient voiced understanding.and reassured Denies any symptoms of retention.

## 2020-09-18 NOTE — Discharge Instructions (Signed)
Instructions after Total Knee Replacement   Allen Wachter P. Taivon Tapia, Jr., M.D.     Dept. of Orthopaedics & Sports Medicine  [facility]  [address]  [place], [place]  [address]  Phone: [phone]   Fax: [phone]    DIET: Drink plenty of non-alcoholic fluids. Resume your normal diet. Include foods high in fiber.  ACTIVITY:  You may use crutches or a walker with weight-bearing as tolerated, unless instructed otherwise. You may be weaned off of the walker or crutches by your Physical Therapist.  Do NOT place pillows under the knee. Anything placed under the knee could limit your ability to straighten the knee.   Continue doing gentle exercises. Exercising will reduce the pain and swelling, increase motion, and prevent muscle weakness.   Please continue to use the TED compression stockings for 6 weeks. You may remove the stockings at night, but should reapply them in the morning. Do not drive or operate any equipment until instructed.  WOUND CARE:  Continue to use the PolarCare or ice packs periodically to reduce pain and swelling. You may bathe or shower after the staples are removed at the first office visit following surgery.  MEDICATIONS: You may resume your regular medications. Please take the pain medication as prescribed on the medication. Do not take pain medication on an empty stomach. You have been given a prescription for a blood thinner (Lovenox or Coumadin). Please take the medication as instructed. (NOTE: After completing a 2 week course of Lovenox, take one Enteric-coated aspirin once a day. This along with elevation will help reduce the possibility of phlebitis in your operated leg.) Do not drive or drink alcoholic beverages when taking pain medications.  CALL THE OFFICE FOR: Temperature above 101 degrees Excessive bleeding or drainage on the dressing. Excessive swelling, coldness, or paleness of the toes. Persistent nausea and vomiting.  FOLLOW-UP:  You  should have an appointment to return to the office in 10-14 days after surgery. Arrangements have been made for continuation of Physical Therapy (either home therapy or outpatient therapy).   [facility] Department Directory         [email or web address]       [email or web address]          Cardiology  Appointments: [place] - [phone] [place] - [phone]  Endocrinology  Appointments: [place] - [phone] [place] - [phone]  Gastroenterology  Appointments: [place] - [phone] [place] - [phone]        General Surgery   Appointments: [place] - [phone]  Internal Medicine/Family Medicine  Appointments: [place] - [phone] [place] - [phone] [place] - [phone]  Metabolic and Weigh Loss Surgery  Appointments: [place] - [phone]        Neurology  Appointments: [place] - [phone] [place] - [phone]  Neurosurgery  Appointments: [place] - [phone]  Obstetrics & Gynecology  Appointments: [place] - [phone] [place] - [phone]        Pediatrics  Appointments: [place] - [phone] [place] - [phone]  Physiatry  Appointments: [place] -[phone]  Physical Therapy  Appointments: [place] - [phone] [place] - [phone]        Podiatry  Appointments: [place] - [phone] [place] - [phone]  Pulmonology  Appointments: [place] - [phone]  Rheumatology  Appointments: [place] - [phone]         Location: [facility]  [address] , [place]  [address]  [place] Location: [facility] [address] [place], [place]  [address]  [place] Location: [facility] [address] [place], [place]  [address]    

## 2020-09-23 ENCOUNTER — Other Ambulatory Visit: Payer: Self-pay

## 2020-09-23 ENCOUNTER — Other Ambulatory Visit
Admission: RE | Admit: 2020-09-23 | Discharge: 2020-09-23 | Disposition: A | Discharge status: Home / Self Care | Payer: Medicare Other | Source: Ambulatory Visit | Attending: Orthopedic Surgery | Admitting: Orthopedic Surgery

## 2020-09-23 DIAGNOSIS — Z01818 Encounter for other preprocedural examination: Secondary | ICD-10-CM | POA: Insufficient documentation

## 2020-09-23 HISTORY — DX: Unspecified osteoarthritis, unspecified site: M19.90

## 2020-09-23 LAB — C-REACTIVE PROTEIN: CRP: <0.5 mg/dL (ref <1.0)

## 2020-09-23 LAB — COMPREHENSIVE METABOLIC PANEL
ALT: 33 U/L (ref 0–44)
AST: 33 U/L (ref 15–41)
Albumin: 4.4 g/dL (ref 3.5–5.0)
Alkaline Phosphatase: 61 U/L (ref 38–126)
Anion gap: 6 (ref 5–15)
BUN: 16 mg/dL (ref 8–23)
CO2: 27 mmol/L (ref 22–32)
Calcium: 9 mg/dL (ref 8.9–10.3)
Chloride: 103 mmol/L (ref 98–111)
Creatinine, Ser: 0.78 mg/dL (ref 0.61–1.24)
GFR, Estimated: >60 mL/min (ref >60)
Glucose, Bld: 95 mg/dL (ref 70–99)
Potassium: 3.8 mmol/L (ref 3.5–5.1)
Sodium: 136 mmol/L (ref 135–145)
Total Bilirubin: 1 mg/dL (ref 0.3–1.2)
Total Protein: 7 g/dL (ref 6.5–8.1)

## 2020-09-23 LAB — URINALYSIS, ROUTINE W REFLEX MICROSCOPIC
Bilirubin Urine: NEGATIVE
Glucose, UA: NEGATIVE mg/dL
Hgb urine dipstick: NEGATIVE
Ketones, ur: NEGATIVE mg/dL
Leukocytes,Ua: NEGATIVE
Nitrite: NEGATIVE
Protein, ur: NEGATIVE mg/dL
Specific Gravity, Urine: 1.013 (ref 1.005–1.030)
pH: 6 (ref 5.0–8.0)

## 2020-09-23 LAB — CBC
HCT: 41.1 % (ref 39.0–52.0)
Hemoglobin: 13.7 g/dL (ref 13.0–17.0)
MCH: 31.7 pg (ref 26.0–34.0)
MCHC: 33.3 g/dL (ref 30.0–36.0)
MCV: 95.1 fL (ref 80.0–100.0)
Platelets: 206 10*3/uL (ref 150–400)
RBC: 4.32 MIL/uL (ref 4.22–5.81)
RDW: 13.5 % (ref 11.5–15.5)
WBC: 4.8 10*3/uL (ref 4.0–10.5)
nRBC: 0 % (ref 0.0–0.2)

## 2020-09-23 LAB — SEDIMENTATION RATE: Sed Rate: 7 mm/hr (ref 0–20)

## 2020-09-23 LAB — TYPE AND SCREEN
ABO/RH(D): A NEG
Antibody Screen: NEGATIVE

## 2020-09-23 LAB — SURGICAL PCR SCREEN
MRSA, PCR: NEGATIVE
Staphylococcus aureus: POSITIVE — AB

## 2020-09-23 NOTE — Patient Instructions (Addendum)
Your procedure is scheduled on: Wednesday October 03, 2020. Report to Day Surgery inside [facility]nd floo (stop by admissions desk first before getting on elevator). To find out your arrival time please call [phone] between 1PM - 3PM on Tuesday October 02, 2020.  Remember: Instructions that are not followed completely may result in serious medical risk,  up to and including death, or upon the discretion of your surgeon and anesthesiologist your  surgery may need to be rescheduled.     _X__ 1. Do not eat food after midnight the night before your procedure.                 No chewing gum or hard candies. You may drink clear liquids up to 2 hours                 before you are scheduled to arrive for your surgery- DO not drink clear                 liquids within 2 hours of the start of your surgery.                 Clear Liquids include:  water, apple juice without pulp, clear Gatorade, G2 or                  Gatorade Zero (avoid Red/Purple/Blue), Black Coffee or Tea (Do not add                 anything to coffee or tea).  __X__2.   Complete the "Ensure Clear Pre-surgery Clear Carbohydrate Drink" provided to you, 2 hours before arrival. **If you are diabetic you will be provided with an alternative drink, Gatorade Zero or G2.  __X__3.  On the morning of surgery brush your teeth with toothpaste and water, you                may rinse your mouth with mouthwash if you wish.  Do not swallow any toothpaste of mouthwash.     _X__ 4.  No Alcohol for 24 hours before or after surgery.   _X__ 5.  Do Not Smoke or use e-cigarettes For 24 Hours Prior to Your Surgery.                 Do not use any chewable tobacco products for at least 6 hours prior to                 Surgery.  _X__  6.  Do not use any recreational drugs (marijuana, cocaine, heroin, ecstasy, MDMA or other)                For at least one week prior to your surgery.  Combination of these drugs with  anesthesia                May have life threatening results.  __X__ 7.  Notify your doctor if there is any change in your medical condition      (cold, fever, infections).     Do not wear jewelry, make-up, hairpins, clips or nail polish. Do not wear lotions, powders, or perfumes. You may wear deodorant. Do not shave 48 hours prior to surgery. Men may shave face and neck. Do not bring valuables to the hospital.    [facility] is not responsible for any belongings or valuables.  Contacts, dentures or bridgework may not be worn into surgery. Leave your suitcase in the car. After surgery  it may be brought to your room. For patients admitted to the hospital, discharge time is determined by your treatment team.   Patients discharged the day of surgery will not be allowed to drive home.   Make arrangements for someone to be with you for the first 24 hours of your Same Day Discharge.    Please read over the following fact sheets that you were given:   Total Joint Packet    __X__ Take these medicines the morning of surgery with A SIP OF WATER:    1. buPROPion (WELLBUTRIN XL) 150 MG   2. gabapentin (NEURONTIN) 300 MG   3. omeprazole (PRILOSEC) 20 MG  4. Fingolimod HCl 0.5 MG  5. mirabegron ER (MYRBETRIQ) 50 MG  6. baclofen (LIORESAL) 20 MG  ____ Fleet Enema (as directed)   __X__ Use CHG Soap (or wipes) as directed  ____ Use Benzoyl Peroxide Gel as instructed  __X__ Use inhalers on the day of surgery  albuterol (VENTOLIN HFA) 108 (90 Base) MCG/ACT inhaler  budesonide-formoterol (SYMBICORT) 160-4.5 MCG/ACT inhaler  ____ Stop metformin 2 days prior to surgery    ____ Take 1/2 of usual insulin dose the night before surgery. No insulin the morning          of surgery.   __X__ Stop Aspirin 81 mg as instructed by your doctor.   __X__ One Week prior to surgery- Stop Anti-inflammatories such as Ibuprofen, Aleve, Advil, Motrin, meloxicam (MOBIC), diclofenac, etodolac, ketorolac,  Toradol, Daypro, piroxicam, Goody's or BC powders. OK TO USE TYLENOL IF NEEDED   __X__ Stop supplements until after surgery.    ____ Bring C-Pap to the hospital.    If you have any questions regarding your pre-procedure instructions,  Please call Pre-admit Testing at [phone].

## 2020-09-25 LAB — URINE CULTURE
Culture: NO GROWTH
Special Requests: NORMAL

## 2020-09-27 ENCOUNTER — Telehealth: Payer: Self-pay | Admitting: *Deleted

## 2020-09-27 NOTE — Telephone Encounter (Signed)
Allen Tapia is a 65 year old male is requesting preoperative cardiac evaluation for RIGHT COMPUTER ASSISTED TOTAL KNEE ARTHROPLASTY.  He was last seen in the clinic on 05/18/2020.  He continued to do well at that time.  He was however recovering from his third round of COVID infection despite having 3 immunizations.  He remained active when the weather was good.  He reported no palpitations.  He was also renovating his house.  He continued to have mild chest twinges.  His chest discomfort was not exertional and was felt to be MSK in nature.  He denied shortness of breath, orthopnea and PND.  He denied syncope and palpitations.  Was PMH includes multiple sclerosis, squamous cell skin cancer, hyperlipidemia, tobacco use and coronary artery disease.  He underwent cardiac catheterization at [facility] 4/09 which showed obstructive LAD disease.  His lesion was not intervened upon due to it not matching ischemic changes on stress testing/atypical symptoms.  His EF was 65%.  Other vessels with nonobstructive lesions were noticed.  Medical management was recommended.  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  May his aspirin be held prior to his procedure?   Thank you for your help.  Please direct response to CV DIV preop will.  Jossie Ng. Skyllar Notarianni NP-C    09/27/2020, 9:13 AM [address] Office [phone] Fax [phone]

## 2020-09-27 NOTE — Progress Notes (Signed)
Perioperative Services Pre-Admission/Anesthesia Testing    Date: 09/28/20  Name: Allen Tapia MRN:   [id number]  Re: Updated clearance for surgery   Case: [id number] Date/Time: 10/05/20 1119   Procedure: COMPUTER ASSISTED TOTAL KNEE ARTHROPLASTY (Right: Knee)   Anesthesia type: Choice   Pre-op diagnosis: PRIMARY OSTEOARTHRITIS OF RIGHT KNEE.   Location: [facility] OR ROOM 01 / [place] [facility] FOR ANESTHESIA GROUP   Surgeons: Dereck Leep, MD   Patient scheduled for the above procedure on 10/05/2020 with Dr. Skip Estimable, MD.  Patient previously reviewed by PAT APP prior to urological procedure/surgery that was performed on 05/20/2020; see my note dated 05/19/2020. Interval health history/records reviewed.   Patient underwent an uncomplicated UroLift procedure on 05/20/2018. He discharged home on POD-0.  Patient was seen in the urgent care on 06/01/2020 painful unilateral rash.  Patient diagnosed with herpes zoster and treated with a 7-day course of valacyclovir.  Patient seen in follow-up consult by pulmonary medicine on 06/06/2020; notes reviewed.  Patient with a mild intermittent asthma diagnosis; symptoms stable overall.  Patient with cough and upper airway congestion.  CXR revealed mild linear scarring at the RIGHT lung base, otherwise no active cardiopulmonary disease.  Patient to continue ICS + LABA (Symbicort) and SABA (albuterol) MDIs as prescribed.  Additionally, patient advised to continue montelukast for allergy symptoms.  Pulmonary provider added fluticasone nasal spray, saline nasal spray, and guaifenesin for cough and congestion.  Patient previously cleared by cardiology prior to urologic procedure back in 04/2020.  With that being said, given the length of time that its been since clearance was issued, patient's past medical history significant for cardiovascular diagnoses, and the nature of his upcoming procedure, updated clearance from cardiology was sought.  Per cardiology, "based  on patient's past medical history and time since his last clinic visit, patient would be at an overall ACCEPTABLE risk for the planned procedure without further cardiovascular testing or intervention at this time". He has been instructed on recommendations for holding his daily low-dose ASA by attending surgeon with plans to restart as soon as postoperative bleeding risk felt to be minimized by his attending surgeon.  Pertinent Clinical Results:   Hospital Outpatient Visit on 09/23/2020  Component Date Value Ref Range Status   CRP 09/23/2020 <0.5  <1.0 mg/dL Final   Performed at [facility], [place] [address], [place], [place] [address]   Sed Rate 09/23/2020 7  0 - 20 mm/hr Final   Performed at [facility], [address] [place], [name] [address]   MRSA, PCR 09/23/2020 NEGATIVE  NEGATIVE Final   Staphylococcus aureus 09/23/2020 POSITIVE (A) NEGATIVE Final   WBC 09/23/2020 4.8  4.0 - 10.5 K/uL Final   RBC 09/23/2020 4.32  4.22 - 5.81 MIL/uL Final   Hemoglobin 09/23/2020 13.7  13.0 - 17.0 g/dL Final   HCT 09/23/2020 41.1  39.0 - 52.0 % Final   MCV 09/23/2020 95.1  80.0 - 100.0 fL Final   MCH 09/23/2020 31.7  26.0 - 34.0 pg Final   MCHC 09/23/2020 33.3  30.0 - 36.0 g/dL Final   RDW 09/23/2020 13.5  11.5 - 15.5 % Final   Platelets 09/23/2020 206  150 - 400 K/uL Final   nRBC 09/23/2020 0.0  0.0 - 0.2 % Final   Performed at [facility], [place], [place] [address]   Sodium 09/23/2020 136  135 - 145 mmol/L Final   Potassium 09/23/2020 3.8  3.5 - 5.1 mmol/L Final  Chloride 09/23/2020 103  98 - 111 mmol/L Final   CO2 09/23/2020 27  22 - 32 mmol/L Final   Glucose, Bld 09/23/2020 95  70 - 99 mg/dL Final   Glucose reference range applies only to samples taken after fasting for at least 8 hours.   BUN 09/23/2020 16  8 - 23 mg/dL Final   Creatinine, Ser 09/23/2020 0.78  0.61 - 1.24 mg/dL Final   Calcium 09/23/2020 9.0  8.9 - 10.3 mg/dL Final   Total  Protein 09/23/2020 7.0  6.5 - 8.1 g/dL Final   Albumin 09/23/2020 4.4  3.5 - 5.0 g/dL Final   AST 09/23/2020 33  15 - 41 U/L Final   ALT 09/23/2020 33  0 - 44 U/L Final   Alkaline Phosphatase 09/23/2020 61  38 - 126 U/L Final   Total Bilirubin 09/23/2020 1.0  0.3 - 1.2 mg/dL Final   GFR, Estimated 09/23/2020 >60  >60 mL/min Final   Comment: (NOTE) Calculated using the CKD-EPI Creatinine Equation (2021)   Anion gap 09/23/2020 6  5 - 15 Final   Performed at [facility], [place], [place] [address]   Color, Urine 09/23/2020 YELLOW (A) YELLOW Final   APPearance 09/23/2020 HAZY (A) CLEAR Final   Specific Gravity, Urine 09/23/2020 1.013  1.005 - 1.030 Final   pH 09/23/2020 6.0  5.0 - 8.0 Final   Glucose, UA 09/23/2020 NEGATIVE  NEGATIVE mg/dL Final   Hgb urine dipstick 09/23/2020 NEGATIVE  NEGATIVE Final   Bilirubin Urine 09/23/2020 NEGATIVE  NEGATIVE Final   Ketones, ur 09/23/2020 NEGATIVE  NEGATIVE mg/dL Final   Protein, ur 09/23/2020 NEGATIVE  NEGATIVE mg/dL Final   Nitrite 09/23/2020 NEGATIVE  NEGATIVE Final   Leukocytes,Ua 09/23/2020 NEGATIVE  NEGATIVE Final   Performed at [facility], [address], [place], [place] [address]   Specimen Description 09/23/2020  URINE, RANDOM  Final   Culture 09/23/2020    Final                   Value:NO GROWTH Performed at [facility], [place] [address] [place], [place] [address]    Report Status 09/23/2020 09/25/2020 FINAL   Final   ABO/RH(D) 09/23/2020 A NEG   Final   Antibody Screen 09/23/2020 NEG   Final   Sample Expiration 09/23/2020 10/07/2020,2359   Final   Extend sample reason 09/23/2020    Final                   Value:NO TRANSFUSIONS OR PREGNANCY IN THE PAST 3 MONTHS Performed at [facility], [address] [place], [place] [address]   ECG: Date: 09/23/2020 Time ECG obtained: 1430 PM Rate: 66 bpm Rhythm: normal sinus Axis (leads I and aVF): Normal Intervals: PR 174 ms. QRS 100  ms. QTc 440 ms. ST segment and T wave changes: No evidence of acute ST segment elevation or depression Comparison: Similar to previous tracing obtained on 05/18/2020  IMAGING: DIAGNOSTIC RADIOGRAPHS OF CHEST 2 VIEW performed in 06/06/2020 Normal heart size Mildly tortuous atherosclerotic thoracic aorta No pneumothorax or pleural effusion Mild linear scarring at the RIGHT lung base No pulmonary edema or acute consolidative airspace disease No active cardiopulmonary disease  Impression and Plan:  Allen Tapia has been referred for pre-anesthesia review and clearance prior to him undergoing the planned anesthetic and procedural courses. Available labs, pertinent testing, and imaging results were personally reviewed by me. This patient has been appropriately cleared by  cardiology with an overall ACCEPTABLE risk of significant perioperative cardiovascular complications.  Based on clinical review performed today (09/28/20), barring any significant acute changes in the patient's overall condition, it is anticipated that he will be able to proceed with the planned surgical intervention. Any acute changes in clinical condition may necessitate his procedure being postponed and/or cancelled. Patient will meet with anesthesia team (MD and/or CRNA) on this day of his procedure for preoperative evaluation/assessment.   Pre-surgical instructions were reviewed with the patient during his PAT appointment and questions were fielded by PAT clinical staff. Patient was advised that if any questions or concerns arise prior to his procedure then he should return a call to PAT and/or his surgeon's office to discuss.  Honor Loh, MSN, APRN, FNP-C, CEN [facility]  Peri-operative Services Nurse Practitioner Phone: [phone] 09/28/20 10:08 AM  NOTE: This note has been prepared using Dragon dictation software. Despite my best ability to proofread, there is always the potential that  unintentional transcriptional errors may still occur from this process.

## 2020-09-27 NOTE — Telephone Encounter (Signed)
Request for pre-operative cardiac clearance Received: 4 days ago Karen Kitchens, NP  P Cv Div Preop Callback Request for pre-operative cardiac clearance:     1. What type of surgery is being performed?  RIGHT COMPUTER ASSISTED TOTAL KNEE ARTHROPLASTY   2. When is this surgery scheduled?  10/05/2020     3. Are there any medications that need to be held prior to surgery?  ASA   4. Practice name and name of physician performing surgery?  Performing surgeon: Dr. Skip Estimable, MD  Requesting clearance: Honor Loh, FNP-C       5. Anesthesia type (none, local, MAC, general)? General   6. What is the office phone and fax number?    Phone: [phone]  Fax: [phone]   ATTENTION: Unable to create telephone message as per your standard workflow. Directed by [facility] providers to send requests for cardiac clearance to this pool for appropriate distribution to provider covering pre-operative clearances.   Honor Loh, MSN, APRN, FNP-C, CEN  [facility]  Peri-operative Services Nurse Practitioner  Phone: [phone]  09/23/20 3:58 PM

## 2020-09-28 NOTE — Telephone Encounter (Signed)
   Name: Allen Tapia  DOB: 1955/12/01  MRN: [id number]   Primary Cardiologist: Buford Dresser, MD  Chart reviewed as part of pre-operative protocol coverage. Patient was contacted 09/28/2020 in reference to pre-operative risk assessment for pending surgery as outlined below.  Allen Tapia was last seen on 04/2020 by Dr. Harrell Gave. I reached out to patient for update on how he is doing. The patient affirms he has been doing well without any new cardiac symptoms.  Therefore, based on ACC/AHA guidelines, the patient would be at acceptable risk for the planned procedure without further cardiovascular testing.   The patient was advised that if he develops new symptoms prior to surgery to contact our office to arrange for a follow-up visit, and he verbalized understanding.  Per verbal discussion with Dr. Harrell Gave, [place] to hold ASA as requested for procedure if needed, resume when felt safe by surgeon.  I will route this recommendation to the requesting party via Epic fax function and remove from pre-op pool. Please call with questions.  Charlie Pitter, PA-C 09/28/2020, 8:56 AM

## 2020-09-28 NOTE — Telephone Encounter (Signed)
Ok to hold aspirin for procedure, restart when clear.

## 2020-10-03 ENCOUNTER — Other Ambulatory Visit: Payer: Self-pay

## 2020-10-03 ENCOUNTER — Other Ambulatory Visit
Admission: RE | Admit: 2020-10-03 | Discharge: 2020-10-03 | Disposition: A | Discharge status: Home / Self Care | Payer: Medicare Other | Source: Ambulatory Visit | Attending: Orthopedic Surgery | Admitting: Orthopedic Surgery

## 2020-10-03 DIAGNOSIS — Z20822 Contact with and (suspected) exposure to covid-19: Secondary | ICD-10-CM | POA: Insufficient documentation

## 2020-10-03 DIAGNOSIS — Z01812 Encounter for preprocedural laboratory examination: Secondary | ICD-10-CM | POA: Insufficient documentation

## 2020-10-04 ENCOUNTER — Telehealth: Payer: Self-pay | Admitting: *Deleted

## 2020-10-04 ENCOUNTER — Encounter: Payer: Self-pay | Admitting: Orthopedic Surgery

## 2020-10-04 DIAGNOSIS — N3281 Overactive bladder: Secondary | ICD-10-CM

## 2020-10-04 LAB — SARS CORONAVIRUS 2 (TAT 6-24 HRS): SARS Coronavirus 2: NEGATIVE

## 2020-10-04 MED ORDER — MIRABEGRON ER 50 MG PO TB24: 50.0000 mg | ORAL_TABLET | Freq: Every day | ORAL | 0 refills | Status: DC

## 2020-10-04 NOTE — Telephone Encounter (Signed)
Called pt, he states he received my voicemail and believes there has been a miscommunication. Ultimately the patient states he feels he has not been on the myrbetriq long enough to notice any difference in symptoms and would like to try an additional month. He would like this sent to his local pharmacy. Rx sent. In addition he is very concerned about the potential cost of the medication, he believes his co-pay will be $150. Advised pt on coupons, see mychart.

## 2020-10-04 NOTE — H&P (Signed)
ORTHOPAEDIC HISTORY & PHYSICAL Allen Tapia, [place] - 09/21/2020 4:00 PM EDT Formatting of this note is different from the original. [place] Chief Complaint:   Chief Complaint  Patient presents with   Knee Pain  H & P RIGHT KNEE   History of Present Illness:   Allen Tapia is a 65 y.o. male that presents to clinic today for his preoperative history and evaluation. Patient presents unaccompanied. The patient is scheduled to undergo a right total knee arthroplasty on 10/05/20 by Dr. Marry Guan. His pain began several years ago. The pain is located primarily along the medial aspect of the knee. He describes his pain as worse with weightbearing. He reports associated swelling with some giving way of the knee He denies associated numbness or tingling, denies locking.   The patient's symptoms have progressed to the point that they decrease his quality of life. The patient has previously undergone conservative treatment including NSAIDS and injections to the knee without adequate control of his symptoms.  Patient does have a history of atypical chest pain. He sees Dr Harrell Gave in [place] annually. Denies history of blood clots, lumbar surgery. No known drug allergies. Patient lives at home with his wife who is a retired Marine scientist.   Of note, patient does have a history of multiple sclerosis with fatigue induced right foot drop. He is followed by Dr. Diamantina Monks and is currently taking Gilenya once daily.  Past Medical, Surgical, Family, Social History, Allergies, Medications:   Past Medical History:  Past Medical History:  Diagnosis Date   Atypical chest pain   Barrett's esophagus   Bilateral cataracts   CAD (coronary artery disease) 12/20/2010   Chronic constipation   Coronary artery disease   FH: ischemic heart disease 12/20/2010   Foreign body 12/20/2010   Former tobacco use   GERD (gastroesophageal reflux disease)   History of  chickenpox   Hyperlipidemia   Hyperplastic colon polyp 08/23/2016   Multiple sclerosis (CMS-HCC)   Shingles   Tubular adenoma of colon, unspecified 08/23/2016   Past Surgical History:  Past Surgical History:  Procedure Laterality Date   [place]   COLONOSCOPY  02/05/2008   COLONOSCOPY  02/12/2008   COLONOSCOPY 09/18/2012  PHX Colon polyps Repeat 09/18/17   COLONOSCOPY 08/23/2016  Tubular adenoma of colon/Hyperplastic colon polyp/Repeat 39yrs/MUS   COLONOSCOPY 04/29/2020  Tubular adenoma/PHx CP/Repeat 71yrs/CTL   EGD 08/23/2016  Gastritis/Negative for Barrett's this bx/Repeat 86yrs/MUS   EGD 04/29/2020  Normal EGD/Repeat as needed/CTL   EXTRACTION CATARACT INTRACAPSULAR W/INSERTION INTRAOCULAR PROSTHESIS Left 02/10/2019  Procedure: LENSX/ Panoptix-----EXTRACTION CATARACT EXTRACAPSULAR ECCE WITH INSERTION INTRAOCULAR PROSTHESIS; Surgeon: Golden Circle, MD; Location: [facility]; Service: Ophthalmology; Laterality: Left;   EXTRACTION CATARACT INTRACAPSULAR W/INSERTION INTRAOCULAR PROSTHESIS Right 03/10/2019  Procedure: LENSX/ Panoptix---EXTRACTION CATARACT EXTRACAPSULAR ECCE WITH INSERTION INTRAOCULAR PROSTHESIS; Surgeon: Golden Circle, MD; Location: [facility]; Service: Ophthalmology; Laterality: Right;   EYE TRAUMA  1970 piece of metal to eye - removed in clinic   FRACTURE SURGERY Right  Right wrist surgery for fracture.   KNEE ARTHROSCOPY Right 01/02/2011  Right knee arthroscopic, partial meniscectomy.   SEPTOPLASTY   TONSILLECTOMY   UPPER GASTROINTESTINAL ENDOSCOPY 02/05/2008   UPPER GASTROINTESTINAL ENDOSCOPY 09/18/2012  +Barrett's Esophagus Repeat 09/18/13   UPPER GASTROINTESTINAL ENDOSCOPY 08/31/2013   Current Medications:  Current Outpatient Medications  Medication Sig Dispense Refill   omeprazole (PRILOSEC) 20 MG DR capsule Take 20 mg by  mouth once daily   acetaminophen (TYLENOL) 500 MG  tablet Take 1,000 mg by mouth as needed for Pain.   ADDERALL XR 25 mg XR capsule Take 1 capsule (25 mg total) by mouth every morning 90 capsule 0   albuterol 90 mcg/actuation inhaler Inhale 2 inhalations into the lungs every 4 (four) hours as needed 3 each 3   aspirin 81 mg tablet Take 1 tablet by mouth once daily   baclofen (LIORESAL) 20 MG tablet TAKE 1 TABLET THREE TIMES A DAY 270 tablet 3   budesonide-formoteroL (SYMBICORT) 160-4.5 mcg/actuation inhaler Inhale 2 inhalations into the lungs 2 (two) times daily   buPROPion (WELLBUTRIN XL) 150 MG XL tablet TAKE 1 TABLET DAILY 90 tablet 3   cholecalciferol (VITAMIN D3) 1000 unit tablet Take 3,000 Units by mouth once daily   dextroamphetamine-amphetamine (ADDERALL XR) 25 MG XR capsule Take 1 capsule (25 mg total) by mouth every morning for 90 days 90 capsule 0   gabapentin (NEURONTIN) 300 MG capsule Take 1 capsule (300 mg total) by mouth 2 (two) times daily   GILENYA 0.5 mg Take 1 capsule (0.5 mg total) by mouth every morning 90 capsule 3   ibuprofen (ADVIL,MOTRIN) 800 MG tablet Take 800 mg by mouth every 6 (six) hours as needed for Pain.   mirabegron (MYRBETRIQ) 50 mg ER tablet Take 50 mg by mouth once daily   montelukast (SINGULAIR) 10 mg tablet Take 10 mg by mouth once daily   multivitamin with minerals tablet Take 1 tablet by mouth once daily   pantoprazole (PROTONIX) 20 MG DR tablet Take 1 tablet (20 mg total) by mouth once daily 90 tablet 3   rosuvastatin (CRESTOR) 20 MG tablet Take 20 mg by mouth once daily   tamsulosin (FLOMAX) 0.4 mg capsule Take 0.4 mg by mouth once daily   traZODone (DESYREL) 100 MG tablet Take 2 tablets (200 mg total) by mouth nightly   No current facility-administered medications for this visit.   Allergies: No Known Allergies  Social History:  Social History   Socioeconomic History   Marital status: Married  Spouse name: Jocelyn Lamer   Number of children: 1   Years of education: 16   Highest education level:  Bachelor's degree (e.g., BA, AB, BS)  Occupational History   Occupation: Retired Therapist, sports  Tobacco Use   Smoking status: Former Smoker  Packs/day: 1.00  Years: 20.00  Pack years: 20.00  Types: Cigarettes  Quit date: 08/25/2006  Years since quitting: 14.0   Smokeless tobacco: Never Used  Vaping Use   Vaping Use: Never used  Substance and Sexual Activity   Alcohol use: No  Alcohol/week: 0.0 standard drinks   Drug use: No   Sexual activity: Defer  Partners: Female   Family History:  Family History  Problem Relation Age of Onset   Glaucoma Mother   Irregular Heart Beat (Arrhythmia) Mother  pacemaker   Cardiomyopathy (Abnormal function of the heart muscle) Mother   Colon polyps Mother   Cataracts Mother   Cancer Father   High blood pressure (Hypertension) Father   Esophageal cancer Cousin   Macular degeneration Neg Hx   Blindness Neg Hx   Diabetes Neg Hx   Anesthesia problems Neg Hx   Review of Systems:   A 10+ ROS was performed, reviewed, and the pertinent orthopaedic findings are documented in the HPI.   Physical Examination:   BP 110/70 (BP Location: Left upper arm, Patient Position: Sitting, BP Cuff Size: Adult)  Ht 180.3 cm (5'  11")  Wt 84.8 kg (187 lb)  BMI 26.08 kg/m   Patient is a well-developed, well-nourished male in no acute distress. Patient has normal mood and affect. Patient is alert and oriented to person, place, and time.   HEENT: Atraumatic, normocephalic. Pupils equal and reactive to light. Extraocular motion intact. Noninjected sclera.  Cardiovascular: Regular rate and rhythm, with no murmurs, rubs, or gallops. Distal pulses palpable.  Respiratory: Lungs clear to auscultation bilaterally.   Right Knee: Soft tissue swelling: minimal Effusion: none Erythema: none Crepitance: mild Tenderness: medial Alignment: relative varus Mediolateral laxity: medial pseudolaxity Posterior sag: negative Patellar tracking: Good tracking without evidence of  subluxation or tilt Atrophy: No significant atrophy.  Quadriceps tone was good. Range of motion: 0/5/135 degrees  Able to plantarflex and dorsiflex the right ankle.  Sensation decreased to pinprick over the saphenous, lateral sural cutaneous, superficial fibular, and deep fibular nerve distributions.  Tests Performed/Reviewed:  X-rays  3 views of the right knee were obtained. Images reveal severe loss of medial compartment joint space with significant osteophyte formation. No fractures or dislocations. No osseous abnormality noted.  I personally ordered and interpreted today's x-rays.  Impression:   ICD-10-CM  1. Primary osteoarthritis of right knee M17.11   Plan:   The patient has end-stage degenerative changes of the right knee. It was explained to the patient that the condition is progressive in nature. Having failed conservative treatment, the patient has elected to proceed with a total joint arthroplasty. The patient will undergo a total joint arthroplasty with Dr. Marry Guan. The risks of surgery, including blood clot and infection, were discussed with the patient. Measures to reduce these risks, including the use of anticoagulation, perioperative antibiotics, and early ambulation were discussed. The importance of postoperative physical therapy was discussed with the patient. The patient elects to proceed with surgery. The patient is instructed to stop all blood thinners prior to surgery. The patient is instructed to call the hospital the day before surgery to learn of the proper arrival time.   Contact our office with any questions or concerns. Follow up as indicated, or sooner should any new problems arise, if conditions worsen, or if they are otherwise concerned.   Allen Tapia, [place] [facility] [place],  [address] Phone: [phone]  This note was generated in part with voice recognition software and I  apologize for any typographical errors that were not detected and corrected.  Electronically signed by Allen Tapia, [place] at 09/21/2020 5:09 PM EDT

## 2020-10-04 NOTE — Telephone Encounter (Signed)
Patient called to report he has been taking Myrbetriq, symptoms have improved with frequency. He states he still has a slow stream  Patient would to know if there is any thing else better.

## 2020-10-04 NOTE — Telephone Encounter (Signed)
Called pt, no answer. Left detailed message for pt per DPR. Advised pt to call back if he desires and appt.

## 2020-10-04 NOTE — Telephone Encounter (Signed)
The Myrbetriq is for the urgency/frequency and more overactive symptoms, and the Flomax is more for blockage.  They are okay to take together if he would like, completely up to how bothersome his symptoms are.  May need a little trial and error of trying 1 or both together to see what works best  Happy to see him in person to discuss further as well  Nickolas Madrid, MD 10/04/2020

## 2020-10-04 NOTE — Addendum Note (Signed)
Addended by: Donalee Citrin on: 10/04/2020 04:08 PM   Modules accepted: Orders

## 2020-10-04 NOTE — Telephone Encounter (Signed)
Notified patient as instructed, patient wants to know if there is anything he can take in place of the myrbetriq because the medication is to much money.

## 2020-10-05 ENCOUNTER — Encounter
Admission: RE | Disposition: A | Discharge status: Home / Self Care | Payer: Self-pay | Source: Home / Self Care | Attending: Orthopedic Surgery

## 2020-10-05 ENCOUNTER — Inpatient Hospital Stay
Admission: RE | Admit: 2020-10-05 | Discharge: 2020-10-06 | DRG: 470 | Disposition: A | Discharge status: Home / Self Care | Payer: Medicare Other | Attending: Orthopedic Surgery | Admitting: Orthopedic Surgery

## 2020-10-05 ENCOUNTER — Other Ambulatory Visit: Payer: Self-pay

## 2020-10-05 ENCOUNTER — Inpatient Hospital Stay: Payer: Medicare Other | Admitting: Urgent Care

## 2020-10-05 ENCOUNTER — Inpatient Hospital Stay: Payer: Medicare Other

## 2020-10-05 ENCOUNTER — Encounter: Payer: Self-pay | Admitting: Orthopedic Surgery

## 2020-10-05 DIAGNOSIS — E785 Hyperlipidemia, unspecified: Secondary | ICD-10-CM | POA: Diagnosis present

## 2020-10-05 DIAGNOSIS — Z7982 Long term (current) use of aspirin: Secondary | ICD-10-CM | POA: Diagnosis not present

## 2020-10-05 DIAGNOSIS — Z7951 Long term (current) use of inhaled steroids: Secondary | ICD-10-CM

## 2020-10-05 DIAGNOSIS — G35 Multiple sclerosis: Secondary | ICD-10-CM | POA: Diagnosis present

## 2020-10-05 DIAGNOSIS — M25761 Osteophyte, right knee: Secondary | ICD-10-CM | POA: Diagnosis present

## 2020-10-05 DIAGNOSIS — Z79899 Other long term (current) drug therapy: Secondary | ICD-10-CM | POA: Diagnosis not present

## 2020-10-05 DIAGNOSIS — M1711 Unilateral primary osteoarthritis, right knee: Principal | ICD-10-CM | POA: Diagnosis present

## 2020-10-05 DIAGNOSIS — Z96651 Presence of right artificial knee joint: Secondary | ICD-10-CM

## 2020-10-05 DIAGNOSIS — E7800 Pure hypercholesterolemia, unspecified: Secondary | ICD-10-CM | POA: Diagnosis present

## 2020-10-05 DIAGNOSIS — K219 Gastro-esophageal reflux disease without esophagitis: Secondary | ICD-10-CM | POA: Diagnosis present

## 2020-10-05 DIAGNOSIS — Z96659 Presence of unspecified artificial knee joint: Secondary | ICD-10-CM

## 2020-10-05 DIAGNOSIS — I2510 Atherosclerotic heart disease of native coronary artery without angina pectoris: Secondary | ICD-10-CM | POA: Diagnosis present

## 2020-10-05 DIAGNOSIS — K5909 Other constipation: Secondary | ICD-10-CM | POA: Diagnosis present

## 2020-10-05 DIAGNOSIS — Z8249 Family history of ischemic heart disease and other diseases of the circulatory system: Secondary | ICD-10-CM

## 2020-10-05 DIAGNOSIS — Z8616 Personal history of COVID-19: Secondary | ICD-10-CM

## 2020-10-05 DIAGNOSIS — Z87891 Personal history of nicotine dependence: Secondary | ICD-10-CM

## 2020-10-05 DIAGNOSIS — M21371 Foot drop, right foot: Secondary | ICD-10-CM | POA: Diagnosis present

## 2020-10-05 DIAGNOSIS — Z20822 Contact with and (suspected) exposure to covid-19: Secondary | ICD-10-CM | POA: Diagnosis present

## 2020-10-05 DIAGNOSIS — J45909 Unspecified asthma, uncomplicated: Secondary | ICD-10-CM | POA: Diagnosis present

## 2020-10-05 DIAGNOSIS — Z820 Family history of epilepsy and other diseases of the nervous system: Secondary | ICD-10-CM | POA: Diagnosis not present

## 2020-10-05 DIAGNOSIS — M25561 Pain in right knee: Secondary | ICD-10-CM | POA: Diagnosis present

## 2020-10-05 HISTORY — PX: KNEE ARTHROPLASTY: SHX992

## 2020-10-05 LAB — ABO/RH: ABO/RH(D): A NEG

## 2020-10-05 SURGERY — ARTHROPLASTY, KNEE, TOTAL, USING IMAGELESS COMPUTER-ASSISTED NAVIGATION
Anesthesia: General | Site: Knee | Laterality: Right

## 2020-10-05 MED ORDER — ORAL CARE MOUTH RINSE: 15.0000 mL | Freq: Once | OROMUCOSAL | Status: AC

## 2020-10-05 MED ORDER — MOMETASONE FURO-FORMOTEROL FUM 200-5 MCG/ACT IN AERO
2.0000 | INHALATION_SPRAY | Freq: Two times a day (BID) | RESPIRATORY_TRACT | Status: DC
  Administered 2020-10-05 – 2020-10-06 (×3): 2 via RESPIRATORY_TRACT
  Filled 2020-10-05: qty 8.8

## 2020-10-05 MED ORDER — ALBUTEROL SULFATE HFA 108 (90 BASE) MCG/ACT IN AERS
2.0000 | INHALATION_SPRAY | Freq: Four times a day (QID) | RESPIRATORY_TRACT | Status: DC | PRN
  Filled 2020-10-05: qty 6.7

## 2020-10-05 MED ORDER — PHENOL 1.4 % MT LIQD
1.0000 | OROMUCOSAL | Status: DC | PRN
  Filled 2020-10-05: qty 177

## 2020-10-05 MED ORDER — SODIUM CHLORIDE 0.9 % IV SOLN: INTRAVENOUS | Status: DC

## 2020-10-05 MED ORDER — ENSURE PRE-SURGERY PO LIQD
296.0000 mL | Freq: Once | ORAL | Status: DC
  Filled 2020-10-05: qty 296

## 2020-10-05 MED ORDER — ACETAMINOPHEN 10 MG/ML IV SOLN
1000.0000 mg | Freq: Four times a day (QID) | INTRAVENOUS | Status: AC
  Administered 2020-10-05 – 2020-10-06 (×4): 1000 mg via INTRAVENOUS
  Filled 2020-10-05 (×4): qty 100

## 2020-10-05 MED ORDER — METOCLOPRAMIDE HCL 10 MG PO TABS
10.0000 mg | ORAL_TABLET | Freq: Three times a day (TID) | ORAL | Status: DC
  Administered 2020-10-05 – 2020-10-06 (×4): 10 mg via ORAL
  Filled 2020-10-05 (×4): qty 1

## 2020-10-05 MED ORDER — CELECOXIB 200 MG PO CAPS: 400.0000 mg | ORAL_CAPSULE | Freq: Once | ORAL | Status: AC

## 2020-10-05 MED ORDER — TRANEXAMIC ACID-NACL 1000-0.7 MG/100ML-% IV SOLN
INTRAVENOUS | Status: AC
  Filled 2020-10-05: qty 100

## 2020-10-05 MED ORDER — PROPOFOL 10 MG/ML IV BOLUS
INTRAVENOUS | Status: DC | PRN
  Administered 2020-10-05: 170 mg via INTRAVENOUS

## 2020-10-05 MED ORDER — TRANEXAMIC ACID-NACL 1000-0.7 MG/100ML-% IV SOLN: 1000.0000 mg | Freq: Once | INTRAVENOUS | Status: AC

## 2020-10-05 MED ORDER — DEXAMETHASONE SODIUM PHOSPHATE 10 MG/ML IJ SOLN
INTRAMUSCULAR | Status: DC | PRN
  Administered 2020-10-05: 10 mg via INTRAVENOUS

## 2020-10-05 MED ORDER — LIDOCAINE HCL (CARDIAC) PF 100 MG/5ML IV SOSY
PREFILLED_SYRINGE | INTRAVENOUS | Status: DC | PRN
  Administered 2020-10-05: 100 mg via INTRAVENOUS

## 2020-10-05 MED ORDER — DIPHENHYDRAMINE HCL 25 MG PO CAPS
25.0000 mg | ORAL_CAPSULE | Freq: Every day | ORAL | Status: DC
  Administered 2020-10-05: 25 mg via ORAL
  Filled 2020-10-05: qty 1

## 2020-10-05 MED ORDER — MAGNESIUM HYDROXIDE 400 MG/5ML PO SUSP
30.0000 mL | Freq: Every day | ORAL | Status: DC
  Administered 2020-10-06: 30 mL via ORAL
  Filled 2020-10-05: qty 30

## 2020-10-05 MED ORDER — CEFAZOLIN SODIUM-DEXTROSE 2-4 GM/100ML-% IV SOLN
2.0000 g | INTRAVENOUS | Status: AC
  Administered 2020-10-05: 2 g via INTRAVENOUS

## 2020-10-05 MED ORDER — SURGIPHOR WOUND IRRIGATION SYSTEM - OPTIME
TOPICAL | Status: DC | PRN
  Administered 2020-10-05: 400 mL via TOPICAL

## 2020-10-05 MED ORDER — CEFAZOLIN SODIUM-DEXTROSE 2-4 GM/100ML-% IV SOLN
INTRAVENOUS | Status: AC
  Administered 2020-10-06: 2 g via INTRAVENOUS
  Filled 2020-10-05: qty 100

## 2020-10-05 MED ORDER — MENTHOL 3 MG MT LOZG
1.0000 | LOZENGE | OROMUCOSAL | Status: DC | PRN
  Filled 2020-10-05: qty 9

## 2020-10-05 MED ORDER — CELECOXIB 200 MG PO CAPS
ORAL_CAPSULE | ORAL | Status: AC
  Administered 2020-10-05: 400 mg via ORAL
  Filled 2020-10-05: qty 2

## 2020-10-05 MED ORDER — OXYCODONE HCL 5 MG PO TABS: 5.0000 mg | ORAL_TABLET | Freq: Once | ORAL | Status: DC | PRN

## 2020-10-05 MED ORDER — CEFAZOLIN SODIUM-DEXTROSE 2-4 GM/100ML-% IV SOLN
2.0000 g | Freq: Four times a day (QID) | INTRAVENOUS | Status: AC
  Administered 2020-10-05: 2 g via INTRAVENOUS
  Filled 2020-10-05 (×2): qty 100

## 2020-10-05 MED ORDER — PROPOFOL 10 MG/ML IV BOLUS
INTRAVENOUS | Status: AC
  Filled 2020-10-05: qty 20

## 2020-10-05 MED ORDER — CHLORHEXIDINE GLUCONATE 0.12 % MT SOLN
OROMUCOSAL | Status: AC
  Administered 2020-10-05: 15 mL via OROMUCOSAL
  Filled 2020-10-05: qty 15

## 2020-10-05 MED ORDER — GABAPENTIN 300 MG PO CAPS
ORAL_CAPSULE | ORAL | Status: AC
  Filled 2020-10-05: qty 1

## 2020-10-05 MED ORDER — PANTOPRAZOLE SODIUM 40 MG PO TBEC
40.0000 mg | DELAYED_RELEASE_TABLET | Freq: Two times a day (BID) | ORAL | Status: DC
  Administered 2020-10-05 – 2020-10-06 (×2): 40 mg via ORAL
  Filled 2020-10-05 (×2): qty 1

## 2020-10-05 MED ORDER — MIDAZOLAM HCL 2 MG/2ML IJ SOLN
INTRAMUSCULAR | Status: DC | PRN
  Administered 2020-10-05: 2 mg via INTRAVENOUS

## 2020-10-05 MED ORDER — ROCURONIUM BROMIDE 100 MG/10ML IV SOLN
INTRAVENOUS | Status: DC | PRN
  Administered 2020-10-05: 60 mg via INTRAVENOUS

## 2020-10-05 MED ORDER — CELECOXIB 200 MG PO CAPS
200.0000 mg | ORAL_CAPSULE | Freq: Two times a day (BID) | ORAL | Status: DC
  Administered 2020-10-05 – 2020-10-06 (×2): 200 mg via ORAL
  Filled 2020-10-05 (×2): qty 1

## 2020-10-05 MED ORDER — FENTANYL CITRATE (PF) 100 MCG/2ML IJ SOLN
INTRAMUSCULAR | Status: AC
  Administered 2020-10-05: 50 ug via INTRAVENOUS
  Filled 2020-10-05: qty 2

## 2020-10-05 MED ORDER — ROSUVASTATIN CALCIUM 10 MG PO TABS
20.0000 mg | ORAL_TABLET | Freq: Every day | ORAL | Status: DC
  Administered 2020-10-05: 20 mg via ORAL
  Filled 2020-10-05: qty 2

## 2020-10-05 MED ORDER — SENNOSIDES-DOCUSATE SODIUM 8.6-50 MG PO TABS
1.0000 | ORAL_TABLET | Freq: Two times a day (BID) | ORAL | Status: DC
  Administered 2020-10-05 – 2020-10-06 (×2): 1 via ORAL
  Filled 2020-10-05 (×2): qty 1

## 2020-10-05 MED ORDER — OXYCODONE HCL 5 MG/5ML PO SOLN: 5.0000 mg | Freq: Once | ORAL | Status: DC | PRN

## 2020-10-05 MED ORDER — FINGOLIMOD HCL 0.5 MG PO CAPS
0.5000 mg | ORAL_CAPSULE | Freq: Every morning | ORAL | Status: DC
  Administered 2020-10-06: 0.5 mg via ORAL
  Filled 2020-10-05: qty 1

## 2020-10-05 MED ORDER — MIDAZOLAM HCL 2 MG/2ML IJ SOLN
INTRAMUSCULAR | Status: AC
  Filled 2020-10-05: qty 2

## 2020-10-05 MED ORDER — FENTANYL CITRATE (PF) 100 MCG/2ML IJ SOLN
INTRAMUSCULAR | Status: DC | PRN
  Administered 2020-10-05: 100 ug via INTRAVENOUS
  Administered 2020-10-05 (×3): 50 ug via INTRAVENOUS

## 2020-10-05 MED ORDER — TRANEXAMIC ACID-NACL 1000-0.7 MG/100ML-% IV SOLN
1000.0000 mg | INTRAVENOUS | Status: AC
  Administered 2020-10-05: 1000 mg via INTRAVENOUS

## 2020-10-05 MED ORDER — OXYCODONE HCL 5 MG PO TABS
5.0000 mg | ORAL_TABLET | ORAL | Status: DC | PRN
Start: 2020-10-05 — End: 2020-10-07
  Administered 2020-10-05: 5 mg via ORAL
  Filled 2020-10-05: qty 1

## 2020-10-05 MED ORDER — ADULT MULTIVITAMIN W/MINERALS CH
1.0000 | ORAL_TABLET | Freq: Every day | ORAL | Status: DC
  Administered 2020-10-06: 1 via ORAL
  Filled 2020-10-05: qty 1

## 2020-10-05 MED ORDER — BUPIVACAINE HCL (PF) 0.25 % IJ SOLN
INTRAMUSCULAR | Status: DC | PRN
  Administered 2020-10-05: 60 mL

## 2020-10-05 MED ORDER — SODIUM CHLORIDE 0.9 % IV SOLN
INTRAVENOUS | Status: DC | PRN
  Administered 2020-10-05: 50 ug/min via INTRAVENOUS

## 2020-10-05 MED ORDER — TRANEXAMIC ACID-NACL 1000-0.7 MG/100ML-% IV SOLN
INTRAVENOUS | Status: AC
  Administered 2020-10-05: 1000 mg via INTRAVENOUS
  Filled 2020-10-05: qty 100

## 2020-10-05 MED ORDER — ACETAMINOPHEN 10 MG/ML IV SOLN
INTRAVENOUS | Status: DC | PRN
  Administered 2020-10-05: 1000 mg via INTRAVENOUS

## 2020-10-05 MED ORDER — EPHEDRINE SULFATE 50 MG/ML IJ SOLN
INTRAMUSCULAR | Status: DC | PRN
  Administered 2020-10-05 (×2): 10 mg via INTRAVENOUS

## 2020-10-05 MED ORDER — GABAPENTIN 300 MG PO CAPS: 300.0000 mg | ORAL_CAPSULE | Freq: Once | ORAL | Status: DC

## 2020-10-05 MED ORDER — HYDROMORPHONE HCL 1 MG/ML IJ SOLN
INTRAMUSCULAR | Status: AC
  Filled 2020-10-05: qty 1

## 2020-10-05 MED ORDER — ONDANSETRON HCL 4 MG/2ML IJ SOLN: 4.0000 mg | Freq: Four times a day (QID) | INTRAMUSCULAR | Status: DC | PRN

## 2020-10-05 MED ORDER — HYDROMORPHONE HCL 1 MG/ML IJ SOLN
INTRAMUSCULAR | Status: DC | PRN
  Administered 2020-10-05: .25 mg via INTRAVENOUS
  Administered 2020-10-05: .5 mg via INTRAVENOUS
  Administered 2020-10-05: .25 mg via INTRAVENOUS

## 2020-10-05 MED ORDER — HYDROMORPHONE HCL 1 MG/ML IJ SOLN
0.5000 mg | INTRAMUSCULAR | Status: DC | PRN
  Administered 2020-10-06: 1 mg via INTRAVENOUS
  Filled 2020-10-05: qty 1

## 2020-10-05 MED ORDER — CHLORHEXIDINE GLUCONATE 0.12 % MT SOLN: 15.0000 mL | Freq: Once | OROMUCOSAL | Status: AC

## 2020-10-05 MED ORDER — GABAPENTIN 300 MG PO CAPS
300.0000 mg | ORAL_CAPSULE | Freq: Three times a day (TID) | ORAL | Status: DC
  Administered 2020-10-05 – 2020-10-06 (×3): 300 mg via ORAL
  Filled 2020-10-05 (×3): qty 1

## 2020-10-05 MED ORDER — 0.9 % SODIUM CHLORIDE (POUR BTL) OPTIME
TOPICAL | Status: DC | PRN
  Administered 2020-10-05: 60 mL

## 2020-10-05 MED ORDER — BISACODYL 10 MG RE SUPP
10.0000 mg | Freq: Every day | RECTAL | Status: DC | PRN
Start: 2020-10-05 — End: 2020-10-07

## 2020-10-05 MED ORDER — LACTATED RINGERS IV SOLN: INTRAVENOUS | Status: DC

## 2020-10-05 MED ORDER — SUGAMMADEX SODIUM 200 MG/2ML IV SOLN
INTRAVENOUS | Status: DC | PRN
  Administered 2020-10-05: 167.8 mg via INTRAVENOUS

## 2020-10-05 MED ORDER — DEXAMETHASONE SODIUM PHOSPHATE 10 MG/ML IJ SOLN
INTRAMUSCULAR | Status: AC
  Administered 2020-10-05: 8 mg via INTRAVENOUS
  Filled 2020-10-05: qty 1

## 2020-10-05 MED ORDER — SODIUM CHLORIDE 0.9 % IR SOLN
Status: DC | PRN
  Administered 2020-10-05: 3060 mL

## 2020-10-05 MED ORDER — ACETAMINOPHEN 10 MG/ML IV SOLN
INTRAVENOUS | Status: AC
  Filled 2020-10-05: qty 100

## 2020-10-05 MED ORDER — SEVOFLURANE IN SOLN
RESPIRATORY_TRACT | Status: AC
  Filled 2020-10-05: qty 250

## 2020-10-05 MED ORDER — TRAMADOL HCL 50 MG PO TABS: 50.0000 mg | ORAL_TABLET | ORAL | Status: DC | PRN

## 2020-10-05 MED ORDER — OXYCODONE HCL 5 MG PO TABS
10.0000 mg | ORAL_TABLET | ORAL | Status: DC | PRN
  Administered 2020-10-05 – 2020-10-06 (×4): 10 mg via ORAL
  Filled 2020-10-05 (×4): qty 2

## 2020-10-05 MED ORDER — BUPROPION HCL ER (XL) 150 MG PO TB24
150.0000 mg | ORAL_TABLET | Freq: Every day | ORAL | Status: DC
  Administered 2020-10-06: 150 mg via ORAL
  Filled 2020-10-05: qty 1

## 2020-10-05 MED ORDER — NEOMYCIN-POLYMYXIN B GU 40-200000 IR SOLN
Status: AC
  Filled 2020-10-05: qty 1

## 2020-10-05 MED ORDER — FLEET ENEMA 7-19 GM/118ML RE ENEM: 1.0000 | ENEMA | Freq: Once | RECTAL | Status: DC | PRN

## 2020-10-05 MED ORDER — TRAZODONE HCL 100 MG PO TABS
100.0000 mg | ORAL_TABLET | Freq: Every day | ORAL | Status: DC
  Administered 2020-10-05: 100 mg via ORAL
  Filled 2020-10-05: qty 1

## 2020-10-05 MED ORDER — FENTANYL CITRATE (PF) 100 MCG/2ML IJ SOLN
25.0000 ug | INTRAMUSCULAR | Status: DC | PRN
  Administered 2020-10-05: 50 ug via INTRAVENOUS

## 2020-10-05 MED ORDER — AMPHETAMINE-DEXTROAMPHET ER 5 MG PO CP24: 25.0000 mg | ORAL_CAPSULE | Freq: Every day | ORAL | Status: DC | PRN

## 2020-10-05 MED ORDER — MONTELUKAST SODIUM 10 MG PO TABS
10.0000 mg | ORAL_TABLET | Freq: Every day | ORAL | Status: DC
  Administered 2020-10-05: 10 mg via ORAL
  Filled 2020-10-05: qty 1

## 2020-10-05 MED ORDER — SODIUM CHLORIDE 0.9 % IV SOLN
INTRAVENOUS | Status: DC | PRN
  Administered 2020-10-05: 60 mL

## 2020-10-05 MED ORDER — DEXAMETHASONE SODIUM PHOSPHATE 10 MG/ML IJ SOLN: 8.0000 mg | Freq: Once | INTRAMUSCULAR | Status: AC

## 2020-10-05 MED ORDER — ALUM & MAG HYDROXIDE-SIMETH 200-200-20 MG/5ML PO SUSP: 30.0000 mL | ORAL | Status: DC | PRN

## 2020-10-05 MED ORDER — SODIUM CHLORIDE 0.9 % IR SOLN
Status: DC | PRN
  Administered 2020-10-05: 3000 mL

## 2020-10-05 MED ORDER — ENOXAPARIN SODIUM 30 MG/0.3ML IJ SOSY
30.0000 mg | PREFILLED_SYRINGE | Freq: Two times a day (BID) | INTRAMUSCULAR | Status: DC
  Administered 2020-10-06 (×2): 30 mg via SUBCUTANEOUS
  Filled 2020-10-05 (×2): qty 0.3

## 2020-10-05 MED ORDER — BACLOFEN 10 MG PO TABS
20.0000 mg | ORAL_TABLET | Freq: Three times a day (TID) | ORAL | Status: DC
  Administered 2020-10-05 – 2020-10-06 (×2): 20 mg via ORAL
  Filled 2020-10-05 (×5): qty 2

## 2020-10-05 MED ORDER — MIRABEGRON ER 50 MG PO TB24
50.0000 mg | ORAL_TABLET | Freq: Every day | ORAL | Status: DC
  Filled 2020-10-05: qty 1

## 2020-10-05 MED ORDER — FENTANYL CITRATE (PF) 250 MCG/5ML IJ SOLN
INTRAMUSCULAR | Status: AC
  Filled 2020-10-05: qty 5

## 2020-10-05 MED ORDER — CHLORHEXIDINE GLUCONATE 4 % EX LIQD: 60.0000 mL | Freq: Once | CUTANEOUS | Status: DC

## 2020-10-05 MED ORDER — DIPHENHYDRAMINE HCL 12.5 MG/5ML PO ELIX: 12.5000 mg | ORAL_SOLUTION | ORAL | Status: DC | PRN

## 2020-10-05 MED ORDER — VITAMIN D 25 MCG (1000 UNIT) PO TABS
3000.0000 [IU] | ORAL_TABLET | Freq: Every day | ORAL | Status: DC
  Administered 2020-10-06: 3000 [IU] via ORAL
  Filled 2020-10-05: qty 3

## 2020-10-05 MED ORDER — ONDANSETRON HCL 4 MG/2ML IJ SOLN
INTRAMUSCULAR | Status: DC | PRN
  Administered 2020-10-05 (×2): 4 mg via INTRAVENOUS

## 2020-10-05 MED ORDER — ONDANSETRON HCL 4 MG PO TABS: 4.0000 mg | ORAL_TABLET | Freq: Four times a day (QID) | ORAL | Status: DC | PRN

## 2020-10-05 MED ORDER — ACETAMINOPHEN 325 MG PO TABS: 325.0000 mg | ORAL_TABLET | Freq: Four times a day (QID) | ORAL | Status: DC | PRN

## 2020-10-05 MED ORDER — FERROUS SULFATE 325 (65 FE) MG PO TABS
325.0000 mg | ORAL_TABLET | Freq: Two times a day (BID) | ORAL | Status: DC
  Administered 2020-10-06 (×2): 325 mg via ORAL
  Filled 2020-10-05 (×2): qty 1

## 2020-10-05 SURGICAL SUPPLY — 77 items
ATTUNE MED DOME PAT 38 KNEE (Knees) ×1 IMPLANT
ATTUNE PS FEM RT SZ9 CEM KNEE (Femur) ×1 IMPLANT
BASE TIBIAL ROT PLAT SZ 8 KNEE (Knees) IMPLANT
BATTERY INSTRU NAVIGATION (MISCELLANEOUS) ×8 IMPLANT
BLADE SAW 70X12.5 (BLADE) ×2 IMPLANT
BLADE SAW 90X13X1.19 OSCILLAT (BLADE) ×2 IMPLANT
BLADE SAW 90X25X1.19 OSCILLAT (BLADE) ×2 IMPLANT
BONE CEMENT GENTAMICIN (Cement) ×4 IMPLANT
BSPLAT TIB 8 CMNT ROT PLAT STR (Knees) ×1 IMPLANT
BTRY SRG DRVR LF (MISCELLANEOUS) ×4
CEMENT BONE GENTAMICIN 40 (Cement) IMPLANT
COOLER POLAR GLACIER W/PUMP (MISCELLANEOUS) ×2 IMPLANT
CUFF TOURN SGL QUICK 24 (TOURNIQUET CUFF) ×2
CUFF TOURN SGL QUICK 34 (TOURNIQUET CUFF)
CUFF TRNQT CYL 24X4X16.5-23 (TOURNIQUET CUFF) IMPLANT
CUFF TRNQT CYL 34X4.125X (TOURNIQUET CUFF) IMPLANT
DRAPE 3/4 80X56 (DRAPES) ×2 IMPLANT
DRSG DERMACEA 8X12 NADH (GAUZE/BANDAGES/DRESSINGS) ×2 IMPLANT
DRSG MEPILEX SACRM 8.7X9.8 (GAUZE/BANDAGES/DRESSINGS) ×2 IMPLANT
DRSG OPSITE POSTOP 4X14 (GAUZE/BANDAGES/DRESSINGS) ×2 IMPLANT
DRSG TEGADERM 4X4.75 (GAUZE/BANDAGES/DRESSINGS) ×2 IMPLANT
DURAPREP 26ML APPLICATOR (WOUND CARE) ×4 IMPLANT
ELECT CAUTERY BLADE 6.4 (BLADE) ×2 IMPLANT
ELECT REM PT RETURN 9FT ADLT (ELECTROSURGICAL) ×2
ELECTRODE REM PT RTRN 9FT ADLT (ELECTROSURGICAL) ×1 IMPLANT
EX-PIN ORTHOLOCK NAV 4X150 (PIN) ×4 IMPLANT
GAUZE 4X4 16PLY ~~LOC~~+RFID DBL (SPONGE) ×2 IMPLANT
GLOVE SURG ENC MOIS LTX SZ7.5 (GLOVE) ×4 IMPLANT
GLOVE SURG ENC TEXT LTX SZ7.5 (GLOVE) ×4 IMPLANT
GLOVE SURG UNDER LTX SZ8 (GLOVE) ×2 IMPLANT
GLOVE SURG UNDER POLY LF SZ7.5 (GLOVE) ×2 IMPLANT
GOWN STRL REUS W/ TWL LRG LVL3 (GOWN DISPOSABLE) ×2 IMPLANT
GOWN STRL REUS W/ TWL XL LVL3 (GOWN DISPOSABLE) ×1 IMPLANT
GOWN STRL REUS W/TWL LRG LVL3 (GOWN DISPOSABLE) ×4
GOWN STRL REUS W/TWL XL LVL3 (GOWN DISPOSABLE) ×2
HEMOVAC 400CC 10FR (MISCELLANEOUS) ×2 IMPLANT
HOLDER FOLEY CATH W/STRAP (MISCELLANEOUS) ×2 IMPLANT
HOOD PEEL AWAY FLYTE STAYCOOL (MISCELLANEOUS) ×4 IMPLANT
INSERT TIBIAL ATTUNE SZ9 5MM (Insert) ×1 IMPLANT
IRRIGATION SURGIPHOR STRL (IV SOLUTION) ×2 IMPLANT
IV NS IRRIG 3000ML ARTHROMATIC (IV SOLUTION) ×2 IMPLANT
KIT TURNOVER KIT A (KITS) ×2 IMPLANT
KNIFE SCULPS 14X20 (INSTRUMENTS) ×2 IMPLANT
LABEL OR SOLS (LABEL) ×2 IMPLANT
MANIFOLD NEPTUNE II (INSTRUMENTS) ×4 IMPLANT
NDL SAFETY ECLIPSE 18X1.5 (NEEDLE) ×1 IMPLANT
NDL SPNL 20GX3.5 QUINCKE YW (NEEDLE) ×2 IMPLANT
NEEDLE HYPO 18GX1.5 SHARP (NEEDLE) ×2
NEEDLE SPNL 20GX3.5 QUINCKE YW (NEEDLE) ×4 IMPLANT
NS IRRIG 500ML POUR BTL (IV SOLUTION) ×2 IMPLANT
PACK TOTAL KNEE (MISCELLANEOUS) ×2 IMPLANT
PAD WRAPON POLAR KNEE (MISCELLANEOUS) ×1 IMPLANT
PENCIL SMOKE EVACUATOR COATED (MISCELLANEOUS) ×2 IMPLANT
PIN DRILL FIX HALF THREAD (BIT) ×4 IMPLANT
PIN DRILL QUICK PACK ×3 IMPLANT
PIN FIXATION 1/8DIA X 3INL (PIN) ×2 IMPLANT
PULSAVAC PLUS IRRIG FAN TIP (DISPOSABLE) ×2
SOL PREP PVP 2OZ (MISCELLANEOUS) ×2
SOLUTION PREP PVP 2OZ (MISCELLANEOUS) ×1 IMPLANT
SPONGE DRAIN TRACH 4X4 STRL 2S (GAUZE/BANDAGES/DRESSINGS) ×2 IMPLANT
SPONGE T-LAP 18X18 ~~LOC~~+RFID (SPONGE) ×6 IMPLANT
STAPLER SKIN PROX 35W (STAPLE) ×2 IMPLANT
STOCKINETTE IMPERV 14X48 (MISCELLANEOUS) ×1 IMPLANT
STRAP TIBIA SHORT (MISCELLANEOUS) ×2 IMPLANT
SUCTION FRAZIER HANDLE 10FR (MISCELLANEOUS) ×1
SUCTION TUBE FRAZIER 10FR DISP (MISCELLANEOUS) ×1 IMPLANT
SUT VIC AB 0 CT1 36 (SUTURE) ×4 IMPLANT
SUT VIC AB 1 CT1 36 (SUTURE) ×4 IMPLANT
SUT VIC AB 2-0 CT2 27 (SUTURE) ×2 IMPLANT
SYR 20ML LL LF (SYRINGE) ×2 IMPLANT
SYR 30ML LL (SYRINGE) ×4 IMPLANT
TIBIAL BASE ROT PLAT SZ 8 KNEE (Knees) ×2 IMPLANT
TIP FAN IRRIG PULSAVAC PLUS (DISPOSABLE) ×1 IMPLANT
TOWEL OR 17X26 4PK STRL BLUE (TOWEL DISPOSABLE) ×2 IMPLANT
TOWER CARTRIDGE SMART MIX (DISPOSABLE) ×2 IMPLANT
TRAY FOLEY MTR SLVR 16FR STAT (SET/KITS/TRAYS/PACK) ×2 IMPLANT
WRAPON POLAR PAD KNEE (MISCELLANEOUS) ×2

## 2020-10-05 NOTE — Op Note (Signed)
OPERATIVE NOTE  DATE OF SURGERY:  10/05/2020  PATIENT NAME:  Allen Tapia   DOB: 1955/05/29  MRN: [id number]  PRE-OPERATIVE DIAGNOSIS: Degenerative arthrosis of the right knee, primary  POST-OPERATIVE DIAGNOSIS:  Same  PROCEDURE:  Right total knee arthroplasty using computer-assisted navigation  SURGEON:  Marciano Sequin. M.D.  ASSISTANT: Cassell Smiles, PA-C (present and scrubbed throughout the case, critical for assistance with exposure, retraction, instrumentation, and closure)  ANESTHESIA: general  ESTIMATED BLOOD LOSS: 50 mL  FLUIDS REPLACED: 1500 mL of crystalloid  TOURNIQUET TIME: 93 minutes  DRAINS: 2 medium Hemovac drains  SOFT TISSUE RELEASES: Anterior cruciate ligament, posterior cruciate ligament, deep  medial collateral ligament, patellofemoral ligament  IMPLANTS UTILIZED: DePuy Attune size 9 posterior stabilized femoral component (cemented), size 8 rotating platform tibial component (cemented), 38 mm medialized dome patella (cemented), and a 5 mm stabilized rotating platform polyethylene insert.  INDICATIONS FOR SURGERY: Allen Tapia is a 65 y.o. year old male with a long history of progressive knee pain. X-rays demonstrated severe degenerative changes in tricompartmental fashion. The patient had not seen any significant improvement despite conservative nonsurgical intervention. After discussion of the risks and benefits of surgical intervention, the patient expressed understanding of the risks benefits and agree with plans for total knee arthroplasty.   The risks, benefits, and alternatives were discussed at length including but not limited to the risks of infection, bleeding, nerve injury, stiffness, blood clots, the need for revision surgery, cardiopulmonary complications, among others, and they were willing to proceed.  PROCEDURE IN DETAIL: The patient was brought into the operating room and, after adequate general anesthesia was achieved, a tourniquet was  placed on the patient's upper thigh. The patient's knee and leg were cleaned and prepped with alcohol and DuraPrep and draped in the usual sterile fashion. A "timeout" was performed as per usual protocol. The lower extremity was exsanguinated using an Esmarch, and the tourniquet was inflated to 300 mmHg. An anterior longitudinal incision was made followed by a standard mid vastus approach. The deep fibers of the medial collateral ligament were elevated in a subperiosteal fashion off of the medial flare of the tibia so as to maintain a continuous soft tissue sleeve. The patella was subluxed laterally and the patellofemoral ligament was incised. Inspection of the knee demonstrated severe degenerative changes with full-thickness loss of articular cartilage. Osteophytes were debrided using a rongeur. Anterior and posterior cruciate ligaments were excised. Two 4.0 mm Schanz pins were inserted in the femur and into the tibia for attachment of the array of trackers used for computer-assisted navigation. Hip center was identified using a circumduction technique. Distal landmarks were mapped using the computer. The distal femur and proximal tibia were mapped using the computer. The distal femoral cutting guide was positioned using computer-assisted navigation so as to achieve a 5 distal valgus cut. The femur was sized and it was felt that a size 9 femoral component was appropriate. A size 9 femoral cutting guide was positioned and the anterior cut was performed and verified using the computer. This was followed by completion of the posterior and chamfer cuts. Femoral cutting guide for the central box was then positioned in the center box cut was performed.  Attention was then directed to the proximal tibia. Medial and lateral menisci were excised. The extramedullary tibial cutting guide was positioned using computer-assisted navigation so as to achieve a 0 varus-valgus alignment and 3 posterior slope. The cut was  performed and verified using the computer. The proximal tibia  was sized and it was felt that a size 8 tibial tray was appropriate. Tibial and femoral trials were inserted followed by insertion of a 5 mm polyethylene insert. This allowed for excellent mediolateral soft tissue balancing both in flexion and in full extension. Finally, the patella was cut and prepared so as to accommodate a 38 mm medialized dome patella. A patella trial was placed and the knee was placed through a range of motion with excellent patellar tracking appreciated. The femoral trial was removed after debridement of posterior osteophytes. The central post-hole for the tibial component was reamed followed by insertion of a keel punch. Tibial trials were then removed. Cut surfaces of bone were irrigated with copious amounts of normal saline using pulsatile lavage and then suctioned dry. Polymethylmethacrylate cement with gentamicin was prepared in the usual fashion using a vacuum mixer. Cement was applied to the cut surface of the proximal tibia as well as along the undersurface of a size 8 rotating platform tibial component. Tibial component was positioned and impacted into place. Excess cement was removed using [employer] fast streamer. Cement was then applied to the cut surfaces of the femur as well as along the posterior flanges of the size 9 femoral component. The femoral component was positioned and impacted into place. Excess cement was removed using [employer] fast streamer. A 5 mm polyethylene trial was inserted and the knee was brought into full extension with steady axial compression applied. Finally, cement was applied to the backside of a 38 mm medialized dome patella and the patellar component was positioned and patellar clamp applied. Excess cement was removed using [employer] fast streamer. After adequate curing of the cement, the tourniquet was deflated after a total tourniquet time of 93 minutes. Hemostasis was achieved using electrocautery. The knee was  irrigated with copious amounts of normal saline using pulsatile lavage followed by 500 ml of Surgiphor and then suctioned dry. 20 mL of 1.3% Exparel and 60 mL of 0.25% Marcaine in 40 mL of normal saline was injected along the posterior capsule, medial and lateral gutters, and along the arthrotomy site. A 5 mm stabilized rotating platform polyethylene insert was inserted and the knee was placed through a range of motion with excellent mediolateral soft tissue balancing appreciated and excellent patellar tracking noted. 2 medium drains were placed in the wound bed and brought out through separate stab incisions. The medial parapatellar portion of the incision was reapproximated using interrupted sutures of #1 Vicryl. Subcutaneous tissue was approximated in layers using first #0 Vicryl followed #2-0 Vicryl. The skin was approximated with skin staples. A sterile dressing was applied.  The patient tolerated the procedure well and was transported to the recovery room in stable condition.    Zakkery Dorian P. Holley Bouche., M.D.

## 2020-10-05 NOTE — Transfer of Care (Signed)
Immediate Anesthesia Transfer of Care Note  Patient: Allen Tapia  Procedure(s) Performed: COMPUTER ASSISTED TOTAL KNEE ARTHROPLASTY (Right: Knee)  Patient Location: PACU  Anesthesia Type:General  Level of Consciousness: sedated  Airway & Oxygen Therapy: Patient Spontanous Breathing and Patient connected to face mask oxygen  Post-op Assessment: Report given to RN and Post -op Vital signs reviewed and stable  Post vital signs: Reviewed and stable  Last Vitals:  Vitals Value Taken Time  BP 111/72 10/05/20 1606  Temp    Pulse 70 10/05/20 1608  Resp 8 10/05/20 1608  SpO2 92 % 10/05/20 1608  Vitals shown include unvalidated device data.  Last Pain:  Vitals:   10/05/20 1012  TempSrc: Oral  PainSc: 0-No pain         Complications: No notable events documented.

## 2020-10-05 NOTE — Anesthesia Procedure Notes (Signed)
Procedure Name: Intubation Date/Time: 10/05/2020 12:28 PM Performed by: Nelda Marseille, CRNA Pre-anesthesia Checklist: Patient identified, Patient being monitored, Timeout performed, Emergency Drugs available and Suction available Patient Re-evaluated:Patient Re-evaluated prior to induction Oxygen Delivery Method: Circle system utilized Preoxygenation: Pre-oxygenation with 100% oxygen Induction Type: IV induction Ventilation: Mask ventilation without difficulty Laryngoscope Size: Mac, 3 and McGraph Grade View: Grade I Tube type: Oral Tube size: 7.5 mm Number of attempts: 1 Airway Equipment and Method: Stylet Placement Confirmation: ETT inserted through vocal cords under direct vision, positive ETCO2 and breath sounds checked- equal and bilateral Secured at: 23 cm Tube secured with: Tape Dental Injury: Teeth and Oropharynx as per pre-operative assessment

## 2020-10-05 NOTE — H&P (Signed)
The patient has been re-examined, and the chart reviewed, and there have been no interval changes to the documented history and physical.    The risks, benefits, and alternatives have been discussed at length. The patient expressed understanding of the risks benefits and agreed with plans for surgical intervention.  Allen Tapia, Jr. M.D.    

## 2020-10-05 NOTE — Anesthesia Preprocedure Evaluation (Addendum)
Anesthesia Evaluation  Patient identified by MRN, date of birth, ID band Patient awake    Reviewed: Allergy & Precautions, NPO status , Patient's Chart, lab work & pertinent test results  Airway Mallampati: III  TM Distance: >3 FB Neck ROM: full    Dental  (+) Chipped   Pulmonary asthma ,    Pulmonary exam normal        Cardiovascular + CAD  Normal cardiovascular exam     Neuro/Psych PSYCHIATRIC DISORDERS  Neuromuscular disease    GI/Hepatic Neg liver ROS, GERD  Medicated and Controlled,  Endo/Other  negative endocrine ROS  Renal/GU      Musculoskeletal   Abdominal   Peds  Hematology negative hematology ROS (+)   Anesthesia Other Findings Past Medical History: No date: Arthritis No date: Asthma No date: Atypical chest pain No date: Barrett's esophagus No date: Bilateral cataracts No date: Chicken pox No date: Chronic constipation 12/20/2010: Coronary artery disease No date: Depression No date: Foreign body (FB) in soft tissue No date: Former tobacco use No date: GERD (gastroesophageal reflux disease) 04/07/2019: History of 2019 novel coronavirus disease (COVID-19) No date: Hyperlipidemia No date: Hyperplastic colon polyp 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 No date: MS (multiple sclerosis) ([place]) No date: Shingles No date: Shingles 08/23/2016: Tubular adenoma of colon  Past Surgical History: 1960: ADENOIDECTOMY 1979: APPENDECTOMY 01/02/2011: ARTHROPLASTY; Right     Comment:  partial meniscectomy  No date: CARDIAC CATHETERIZATION 03/12/2019: CATARACT EXTRACTION, BILATERAL 02/05/2008: COLONOSCOPY 08/23/2016: COLONOSCOPY WITH PROPOFOL; N/A     Comment:  Procedure: COLONOSCOPY WITH PROPOFOL;  Surgeon:               Lollie Sails, MD;  Location: [facility] ENDOSCOPY;                Service: Endoscopy;  Laterality: N/A; 04/29/2020: COLONOSCOPY WITH PROPOFOL; N/A     Comment:  Procedure: COLONOSCOPY WITH  PROPOFOL;  Surgeon:               Lesly Rubenstein, MD;  Location: [facility] ENDOSCOPY;                Service: Endoscopy;  Laterality: N/A; 05/20/2020: CYSTOSCOPY WITH INSERTION OF UROLIFT; N/A     Comment:  Procedure: CYSTOSCOPY WITH INSERTION OF UROLIFT;                Surgeon: Billey Co, MD;  Location: [facility] ORS;                Service: Urology;  Laterality: N/A; 08/23/2016: ESOPHAGOGASTRODUODENOSCOPY (EGD) WITH PROPOFOL; N/A     Comment:  Procedure: ESOPHAGOGASTRODUODENOSCOPY (EGD) WITH               PROPOFOL;  Surgeon: Lollie Sails, MD;  Location:               [facility] ENDOSCOPY;  Service: Endoscopy;  Laterality: N/A; 04/29/2020: ESOPHAGOGASTRODUODENOSCOPY (EGD) WITH PROPOFOL; N/A     Comment:  Procedure: ESOPHAGOGASTRODUODENOSCOPY (EGD) WITH               PROPOFOL;  Surgeon: Lesly Rubenstein, MD;  Location:               [facility] ENDOSCOPY;  Service: Endoscopy;  Laterality: N/A; 02/05/2008: ESOPHAGOGASTRODUODENOSCOPY ENDOSCOPY No date: EYE SURGERY 1970: eye trauma     Comment:  piece of metal to eye removed in clinic No date: FRACTURE SURGERY; Right     Comment:  wrist  No date: FRACTURE SURGERY; Right  Comment:  knee; partial meniscectomy No date: right wrist surgery No date: SEPTOPLASTY 02/05/2008: TONSILLECTOMY  BMI    Body Mass Index: 25.09 kg/m      Reproductive/Obstetrics negative OB ROS                            Anesthesia Physical Anesthesia Plan  ASA: 3  Anesthesia Plan: General ETT   Post-op Pain Management:    Induction: Intravenous  PONV Risk Score and Plan: Ondansetron, Dexamethasone, Midazolam and Treatment may vary due to age or medical condition  Airway Management Planned: Oral ETT  Additional Equipment:   Intra-op Plan:   Post-operative Plan: Extubation in OR  Informed Consent: I have reviewed the patients History and Physical, chart, labs and discussed the procedure including the risks, benefits and  alternatives for the proposed anesthesia with the patient or authorized representative who has indicated his/her understanding and acceptance.     Dental Advisory Given  Plan Discussed with: Anesthesiologist, CRNA and Surgeon  Anesthesia Plan Comments: (Patient consented for risks of anesthesia including but not limited to:  - adverse reactions to medications - damage to eyes, teeth, lips or other oral mucosa - nerve damage due to positioning  - sore throat or hoarseness - Damage to heart, brain, nerves, lungs, other parts of body or loss of life  Patient voiced understanding.)       Anesthesia Quick Evaluation

## 2020-10-06 ENCOUNTER — Encounter: Payer: Self-pay | Admitting: Orthopedic Surgery

## 2020-10-06 MED ORDER — ENOXAPARIN SODIUM 40 MG/0.4ML IJ SOSY: 40.0000 mg | PREFILLED_SYRINGE | INTRAMUSCULAR | 0 refills | Status: DC

## 2020-10-06 MED ORDER — CELECOXIB 200 MG PO CAPS: 200.0000 mg | ORAL_CAPSULE | Freq: Two times a day (BID) | ORAL | 0 refills | Status: DC

## 2020-10-06 MED ORDER — OXYCODONE HCL 5 MG PO TABS: 5.0000 mg | ORAL_TABLET | ORAL | 0 refills | Status: DC | PRN

## 2020-10-06 NOTE — Plan of Care (Signed)
New care plan initiated 

## 2020-10-06 NOTE — Progress Notes (Signed)
Physical Therapy Treatment Patient Details Name: Allen Tapia MRN: [id number] DOB: 11/17/1955 Today's Date: 10/06/2020    History of Present Illness Patient is a 65 year old male s/p R TKA. PMH includes: Atypical chest pain, Barrett's esophagus, Bilateral cataracts, CAD, Chronic constipation ,Coronary artery disease, FH: ischemic heart disease 12/20/2010, Former tobacco use GERD, Hyperlipidemia, colon polyp 08/23/2016,  Multiple sclerosis (CMS-HCC)    Shingles, Tubular adenoma of colon, unspecified 08/23/2016    PT Comments    Patient received in bed, he is agreeable to PT session. Ambulated into bathroom prior to session. He is mod independent with bed mobility and transfers. Ambulated 175 feet with RW and up/down 4 steps with B rails, min guard.  Patient reporting very little pain, mobility is great. He is making good progress and is hopeful to go home later today. If he stays, he will continue to benefit from skilled PT to continue with ambulation and strengthening.      Follow Up Recommendations  Follow surgeon's recommendation for DC plan and follow-up therapies;Outpatient PT;Home health PT     Equipment Recommendations  Rolling walker with 5" wheels    Recommendations for Other Services       Precautions / Restrictions Precautions Precautions: Fall Restrictions Weight Bearing Restrictions: No RLE Weight Bearing: Weight bearing as tolerated    Mobility  Bed Mobility Overal bed mobility: Modified Independent                  Transfers Overall transfer level: Needs assistance Equipment used: Rolling walker (2 wheeled) Transfers: Sit to/from Stand Sit to Stand: Supervision            Ambulation/Gait Ambulation/Gait assistance: Min guard Gait Distance (Feet): 175 Feet Assistive device: Rolling walker (2 wheeled) Gait Pattern/deviations: Step-through pattern;Decreased weight shift to right Gait velocity: decr   General Gait Details: patient requires cues  for sequencing, WB status   Stairs Stairs: Yes Stairs assistance: Min guard Stair Management: Two rails;Step to pattern Number of Stairs: 4     Wheelchair Mobility    Modified Rankin (Stroke Patients Only)       Balance Overall balance assessment: Modified Independent Sitting-balance support: Feet supported Sitting balance-Leahy Scale: Normal     Standing balance support: Bilateral upper extremity supported;During functional activity Standing balance-Leahy Scale: Good Standing balance comment: reliant on B UE support and min guard, able to wash hands, use bathroom without B UE assist.                            Cognition Arousal/Alertness: Awake/alert Behavior During Therapy: WFL for tasks assessed/performed Overall Cognitive Status: Within Functional Limits for tasks assessed                                        Exercises Total Joint Exercises Ankle Circles/Pumps: AROM;Both;10 reps Quad Sets: AROM;Both;10 reps Short Arc Quad: AROM;Right;10 reps Heel Slides: AROM;Right;10 reps Straight Leg Raises: AROM;Right;10 reps    General Comments        Pertinent Vitals/Pain Pain Assessment: 0-10 Pain Score: 1  Pain Location: R knee Pain Descriptors / Indicators: Discomfort;Sore Pain Intervention(s): Monitored during session;Repositioned;Ice applied    Home Living                      Prior Function  PT Goals (current goals can now be found in the care plan section) Acute Rehab PT Goals Patient Stated Goal: to return home PT Goal Formulation: With patient/family Time For Goal Achievement: 10/13/20 Potential to Achieve Goals: Good Progress towards PT goals: Progressing toward goals    Frequency    BID      PT Plan Current plan remains appropriate    Co-evaluation              AM-PAC PT "6 Clicks" Mobility   Outcome Measure  Help needed turning from your back to your side while in a flat bed  without using bedrails?: None Help needed moving from lying on your back to sitting on the side of a flat bed without using bedrails?: None Help needed moving to and from a bed to a chair (including a wheelchair)?: A Little Help needed standing up from a chair using your arms (e.g., wheelchair or bedside chair)?: A Little Help needed to walk in hospital room?: A Little Help needed climbing 3-5 steps with a railing? : A Little 6 Click Score: 20    End of Session Equipment Utilized During Treatment: Gait belt Activity Tolerance: Patient tolerated treatment well Patient left: in bed;with call bell/phone within reach;with bed alarm set;with SCD's reapplied Nurse Communication: Mobility status PT Visit Diagnosis: Difficulty in walking, not elsewhere classified (R26.2);Pain Pain - Right/Left: Right Pain - part of body: Knee     Time: 1420-1451 PT Time Calculation (min) (ACUTE ONLY): 31 min  Charges:  $Gait Training: 8-22 mins $Therapeutic Exercise: 8-22 mins                    [employer], PT, [employer] 10/06/20,3:58 PM

## 2020-10-06 NOTE — Plan of Care (Signed)
Post-op dressing removed. , Hemovac removed., Mini compression dressing applied. , and Fresh honeycomb dressing applied.   

## 2020-10-06 NOTE — Evaluation (Signed)
Occupational Therapy Evaluation Patient Details Name: Allen Tapia MRN: [id number] DOB: March 10, 1956 Today's Date: 10/06/2020    History of Present Illness Patient is a 65 year old male s/p R TKA. PMH includes: Atypical chest pain, Barrett's esophagus, Bilateral cataracts, CAD, Chronic constipation ,Coronary artery disease, FH: ischemic heart disease 12/20/2010, Former tobacco use GERD, Hyperlipidemia, colon polyp 08/23/2016,  Multiple sclerosis (CMS-HCC)    Shingles, Tubular adenoma of colon, unspecified 08/23/2016   Clinical Impression   Pt seen for OT evaluation this date in setting of acute hospitalization d/t R TKA. Pt reports being INDEP at baseline. Pt with some slight (expected) post-op ROM limitations, but with ed re: modifications, pt is able to ultimately perform LB ADLs with modified technique. Pt able to perform ADL transfers and fxl mobility with SBA with RW this date, demonstrating good carryover for sequence of hand placement. Overall, pt appears safe to return to home environment. Ed provided re: compression stocking, polar care mgt, and showering recommendations post-op'ly. No further acute OT needs detected at this time, nor is it anticipated that pt will require OT f/u at home as he has good understanding of education and good home support. Will complete order at this time.     Follow Up Recommendations  No OT follow up    Equipment Recommendations  3 in 1 bedside commode    Recommendations for Other Services       Precautions / Restrictions Precautions Precautions: Fall Restrictions Weight Bearing Restrictions: Yes RLE Weight Bearing: Weight bearing as tolerated      Mobility Bed Mobility Overal bed mobility: Modified Independent                  Transfers Overall transfer level: Needs assistance Equipment used: Rolling walker (2 wheeled) Transfers: Sit to/from Stand Sit to Stand: Supervision         General transfer comment: good carryoer of  hand placement education    Balance Overall balance assessment: Modified Independent Sitting-balance support: Feet supported Sitting balance-Leahy Scale: Normal     Standing balance support: Bilateral upper extremity supported;During functional activity Standing balance-Leahy Scale: Good Standing balance comment: UE support on RW, but able to alternate and does not require external support to sustain standing balance for static standing or fxl mobility to/from restroom                           ADL either performed or assessed with clinical judgement   ADL Overall ADL's : Modified independent                                       General ADL Comments: Pt was completely INDEP at baseline. With education re: modifications, while he does struggle to complete LB ADLs post-op'ly, he is still ultimately able to complete them with no physical assistance and only utilizes the modified techniques. Does not require AE d/t relatively functional post op R knee ROM     Vision Patient Visual Report: No change from baseline       Perception     Praxis      Pertinent Vitals/Pain Pain Assessment: Faces Pain Score: 1  Faces Pain Scale: Hurts a little bit Pain Location: R knee Pain Descriptors / Indicators: Sore Pain Intervention(s): Monitored during session;Ice applied     Hand Dominance     Extremity/Trunk Assessment Upper Extremity Assessment  Upper Extremity Assessment: Overall WFL for tasks assessed   Lower Extremity Assessment Lower Extremity Assessment: Overall WFL for tasks assessed (expected post-op limitations, but mostly functional and while difficult, the ROM is at least appropriate to perform his basic self care routines)   Cervical / Trunk Assessment Cervical / Trunk Assessment: Normal   Communication Communication Communication: No difficulties   Cognition Arousal/Alertness: Awake/alert Behavior During Therapy: WFL for tasks  assessed/performed Overall Cognitive Status: Within Functional Limits for tasks assessed                                     General Comments       Exercises Total Joint Exercises Ankle Circles/Pumps: AROM;Both;10 reps Quad Sets: AROM;Both;10 reps Short Arc Quad: AROM;Right;10 reps Heel Slides: AROM;Right;10 reps Straight Leg Raises: AROM;Right;10 reps Other Exercises Other Exercises: OT ed re: compression stocking mgt, polar care mgt, importance of OOB activity, safe use of RW including sequence of hand placement. Pt with good understanding, awareness and carryover. No need for further education. handout issued.   Shoulder Instructions      Home Living Family/patient expects to be discharged to:: Private residence Living Arrangements: Spouse/significant other Available Help at Discharge: Family;Available 24 hours/day Type of Home: House Home Access: Stairs to enter [employer] of Steps: 4-5 Entrance Stairs-Rails: Right;Left;Can reach both                 Home Equipment: Cane - quad;Shower seat;Walker - 2 wheels;Bedside commode   Additional Comments: pt states that all equipment is leftover from his spouse's mother      Prior Functioning/Environment Level of Independence: Independent                 OT Problem List: Decreased activity tolerance;Impaired balance (sitting and/or standing);Pain      OT Treatment/Interventions: Self-care/ADL training;Therapeutic activities    OT Goals(Current goals can be found in the care plan section) Acute Rehab OT Goals Patient Stated Goal: to return home OT Goal Formulation: All assessment and education complete, DC therapy  OT Frequency:     Barriers to D/C:            Co-evaluation              AM-PAC OT "6 Clicks" Daily Activity     Outcome Measure Help from another person eating meals?: None Help from another person taking care of personal grooming?: None Help from another  person toileting, which includes using toliet, bedpan, or urinal?: None Help from another person bathing (including washing, rinsing, drying)?: A Little (d/t nature of wet surface) Help from another person to put on and taking off regular upper body clothing?: None Help from another person to put on and taking off regular lower body clothing?: None 6 Click Score: 23   End of Session Equipment Utilized During Treatment: Gait belt;Rolling walker Nurse Communication: Mobility status  Activity Tolerance: Patient tolerated treatment well Patient left: in bed;with call bell/phone within reach;Other (comment) (polar care re-attached)  OT Visit Diagnosis: Other abnormalities of gait and mobility (R26.89)                Time: 1700-1737 OT Time Calculation (min): 37 min Charges:  OT General Charges $OT Visit: 1 Visit OT Evaluation $OT Eval Moderate Complexity: 1 Mod OT Treatments $Self Care/Home Management : 8-22 mins $Therapeutic Activity: 8-22 mins  Gerrianne Scale, MS, OTR/L ascom [phone] 10/06/20, 6:58 PM

## 2020-10-06 NOTE — Progress Notes (Signed)
  Subjective: 1 Day Post-Op Procedure(s) (LRB): COMPUTER ASSISTED TOTAL KNEE ARTHROPLASTY (Right) Patient reports pain as well-controlled.   Patient is well, and has had no acute complaints or problems Plan is to go Home after hospital stay. Negative for chest pain and shortness of breath Fever: no Gastrointestinal: negative for nausea and vomiting.  Patient has not had a bowel movement.  Objective: Vital signs in last 24 hours: Temp:  [97.5 F (36.4 C)-98.8 F (37.1 C)] 98.2 F (36.8 C) (07/14 0731) Pulse Rate:  [65-73] 65 (07/14 0731) Resp:  [10-18] 17 (07/14 0731) BP: (111-127)/(64-83) 112/65 (07/14 0731) SpO2:  [93 %-99 %] 97 % (07/14 0731) Weight:  [83.9 kg] 83.9 kg (07/13 1012)  Intake/Output from previous day:  Intake/Output Summary (Last 24 hours) at 10/06/2020 0741 Last data filed at 10/06/2020 0037 Gross per 24 hour  Intake 3080.39 ml  Output 1025 ml  Net 2055.39 ml    Intake/Output this shift: No intake/output data recorded.  Labs: No results for input(s): HGB in the last 72 hours. No results for input(s): WBC, RBC, HCT, PLT in the last 72 hours. No results for input(s): NA, K, CL, CO2, BUN, CREATININE, GLUCOSE, CALCIUM in the last 72 hours. No results for input(s): LABPT, INR in the last 72 hours.   EXAM General - Patient is Alert, Appropriate, and Oriented Extremity - Neurovascular intact Dorsiflexion/Plantar flexion intact Compartment soft Dressing/Incision -Postoperative dressing remains in place., Polar Care in place and working. , Hemovac in place.  Motor Function - intact, moving foot and toes well on exam. Able to perform independent SLR.  Cardiovascular- Regular rate and rhythm, no murmurs/rubs/gallops Respiratory- Lungs clear to auscultation bilaterally Gastrointestinal- soft, nontender, and active bowel sounds   Assessment/Plan: 1 Day Post-Op Procedure(s) (LRB): COMPUTER ASSISTED TOTAL KNEE ARTHROPLASTY (Right) Active Problems:   Total knee  replacement status  Estimated body mass index is 25.09 kg/m as calculated from the following:   Height as of this encounter: 6' (1.829 m).   Weight as of this encounter: 83.9 kg. Advance diet Up with therapy  Possible d/c this PM pending PT clearance.      DVT Prophylaxis - Lovenox, Ted hose, and foot pumps Weight-Bearing as tolerated to right leg  Cassell Smiles, PA-C [facility] 10/06/2020, 7:41 AM

## 2020-10-06 NOTE — Plan of Care (Signed)
  Problem: Education: Goal: Knowledge of General Education information will improve Description Including pain rating scale, medication(s)/side effects and non-pharmacologic comfort measures Outcome: Progressing   

## 2020-10-06 NOTE — Anesthesia Postprocedure Evaluation (Signed)
Anesthesia Post Note  Patient: Allen Tapia  Procedure(s) Performed: COMPUTER ASSISTED TOTAL KNEE ARTHROPLASTY (Right: Knee)  Patient location during evaluation: PACU Anesthesia Type: General Level of consciousness: awake and alert Pain management: pain level controlled Vital Signs Assessment: post-procedure vital signs reviewed and stable Respiratory status: spontaneous breathing, nonlabored ventilation, respiratory function stable and patient connected to nasal cannula oxygen Cardiovascular status: blood pressure returned to baseline and stable Postop Assessment: no apparent nausea or vomiting Anesthetic complications: no   No notable events documented.   Last Vitals:  Vitals:   10/06/20 1144 10/06/20 1619  BP: 111/61 98/76  Pulse: 65 64  Resp: 17 20  Temp: 36.6 C 36.8 C  SpO2: 96% 95%    Last Pain:  Vitals:   10/06/20 1422  TempSrc:   PainSc: 4                  Arita Miss

## 2020-10-06 NOTE — Evaluation (Signed)
Physical Therapy Evaluation Patient Details Name: Allen Tapia MRN: [id number] DOB: 09/05/1955 Today's Date: 10/06/2020   History of Present Illness  Patient is a 65 year old male s/p R TKA. PMH includes: Atypical chest pain, Barrett's esophagus, Bilateral cataracts, CAD, Chronic constipation ,Coronary artery disease, FH: ischemic heart disease 12/20/2010, Former tobacco use GERD, Hyperlipidemia, colon polyp 08/23/2016,  Multiple sclerosis (CMS-HCC)    Shingles, Tubular adenoma of colon, unspecified 08/23/2016   Clinical Impression  Patient received in bed, agreeable to PT assessment. He is mod independent with bed mobility, transfers with cues and min guard. Ambulated 50 feet with RW and min guard. Cues for sequencing and WB status. Patient performed LE strengthening and ROM exercises at end of session. He will continue to benefit from skilled PT while here to improve functional mobility and independence.     Follow Up Recommendations Follow surgeon's recommendation for DC plan and follow-up therapies;Outpatient PT    Equipment Recommendations  Rolling walker with 5" wheels    Recommendations for Other Services       Precautions / Restrictions Precautions Precautions: Fall Restrictions Weight Bearing Restrictions: No RLE Weight Bearing: Weight bearing as tolerated      Mobility  Bed Mobility Overal bed mobility: Modified Independent                  Transfers Overall transfer level: Needs assistance Equipment used: Rolling walker (2 wheeled) Transfers: Sit to/from Stand Sit to Stand: Supervision            Ambulation/Gait Ambulation/Gait assistance: Min guard Gait Distance (Feet): 50 Feet Assistive device: Rolling walker (2 wheeled) Gait Pattern/deviations: Step-through pattern Gait velocity: decr   General Gait Details: patient requires cues for sequencing, WB status  Stairs            Wheelchair Mobility    Modified Rankin (Stroke Patients  Only)       Balance Overall balance assessment: Needs assistance Sitting-balance support: Feet supported Sitting balance-Leahy Scale: Normal     Standing balance support: Bilateral upper extremity supported;During functional activity Standing balance-Leahy Scale: Good Standing balance comment: reliant on B UE support and min guard                             Pertinent Vitals/Pain Pain Assessment: Faces Faces Pain Scale: Hurts a little bit Pain Location: R knee Pain Descriptors / Indicators: Discomfort;Sore Pain Intervention(s): Limited activity within patient's tolerance;Monitored during session;Ice applied    Home Living Family/patient expects to be discharged to:: Private residence Living Arrangements: Spouse/significant other Available Help at Discharge: Family;Available 24 hours/day Type of Home: House Home Access: Stairs to enter Entrance Stairs-Rails: Right;Left;Can reach both Entrance Stairs-Number of Steps: 4-5   Home Equipment: Cane - quad      Prior Function Level of Independence: Independent               Hand Dominance        Extremity/Trunk Assessment   Upper Extremity Assessment Upper Extremity Assessment: Defer to OT evaluation    Lower Extremity Assessment Lower Extremity Assessment: Overall WFL for tasks assessed    Cervical / Trunk Assessment Cervical / Trunk Assessment: Normal  Communication   Communication: No difficulties  Cognition Arousal/Alertness: Awake/alert Behavior During Therapy: WFL for tasks assessed/performed Overall Cognitive Status: Within Functional Limits for tasks assessed  General Comments      Exercises Total Joint Exercises Ankle Circles/Pumps: AROM;Both;10 reps Quad Sets: AROM;Right;10 reps Hip ABduction/ADduction: AROM;Right;10 reps Straight Leg Raises: AROM;Right;10 reps Long Arc Quad: AROM;Right;10 reps Knee Flexion:  AROM;Right Goniometric ROM: 3-90   Assessment/Plan    PT Assessment Patient needs continued PT services  PT Problem List Decreased strength;Decreased range of motion;Decreased activity tolerance;Pain;Decreased mobility;Decreased knowledge of use of DME;Decreased safety awareness       PT Treatment Interventions DME instruction;Therapeutic exercise;Gait training;Balance training;Stair training;Functional mobility training;Therapeutic activities;Patient/family education    PT Goals (Current goals can be found in the Care Plan section)  Acute Rehab PT Goals Patient Stated Goal: to return home PT Goal Formulation: With patient/family Time For Goal Achievement: 10/13/20 Potential to Achieve Goals: Good    Frequency BID   Barriers to discharge        Co-evaluation               AM-PAC PT "6 Clicks" Mobility  Outcome Measure Help needed turning from your back to your side while in a flat bed without using bedrails?: None Help needed moving from lying on your back to sitting on the side of a flat bed without using bedrails?: None Help needed moving to and from a bed to a chair (including a wheelchair)?: A Little Help needed standing up from a chair using your arms (e.g., wheelchair or bedside chair)?: A Little Help needed to walk in hospital room?: A Little Help needed climbing 3-5 steps with a railing? : A Little 6 Click Score: 20    End of Session Equipment Utilized During Treatment: Gait belt Activity Tolerance: Patient tolerated treatment well Patient left: in chair;with call bell/phone within reach Nurse Communication: Mobility status PT Visit Diagnosis: Difficulty in walking, not elsewhere classified (R26.2);Pain Pain - Right/Left: Right Pain - part of body: Knee    Time: 1015-1046 PT Time Calculation (min) (ACUTE ONLY): 31 min   Charges:   PT Evaluation $PT Eval Moderate Complexity: 1 Mod PT Treatments $Therapeutic Exercise: 8-22 mins        Jamaiyah Pyle, PT, GCS 10/06/20,10:59 AM

## 2020-10-06 NOTE — Discharge Summary (Signed)
Physician Discharge Summary  Patient ID: Allen Tapia MRN: [id number] DOB/AGE: [birth year] 65 y.o.  Admit date: 10/05/2020 Discharge date: 10/06/2020  Admission Diagnoses:  Total knee replacement status [Z96.659]  Surgeries:Procedure(s): Right total knee arthroplasty using computer-assisted navigation   SURGEON:  Marciano Sequin. M.D.   ASSISTANT: Cassell Smiles, PA-C (present and scrubbed throughout the case, critical for assistance with exposure, retraction, instrumentation, and closure)   ANESTHESIA: general   ESTIMATED BLOOD LOSS: 50 mL   FLUIDS REPLACED: 1500 mL of crystalloid   TOURNIQUET TIME: 93 minutes   DRAINS: 2 medium Hemovac drains   SOFT TISSUE RELEASES: Anterior cruciate ligament, posterior cruciate ligament, deep  medial collateral ligament, patellofemoral ligament   IMPLANTS UTILIZED: DePuy Attune size 9 posterior stabilized femoral component (cemented), size 8 rotating platform tibial component (cemented), 38 mm medialized dome patella (cemented), and a 5 mm stabilized rotating platform polyethylene insert.  Discharge Diagnoses: Patient Active Problem List   Diagnosis Date Noted   Total knee replacement status 10/05/2020   Primary osteoarthritis of right knee 06/05/2020   Mild intermittent asthma 12/07/2019   Pure hypercholesterolemia 05/27/2019   Status post laser cataract surgery, left 02/11/2019   Barrett's esophagus 10/10/2016   Chronic constipation 10/10/2016   Ankylosis of right elbow joint 04/11/2015   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   Encounter for long-term (current) use of other medications 06/08/2013   Dyspnea, unspecified 04/07/2012   Multiple sclerosis ([place]) 01/31/2012   Acid reflux 12/20/2010   CAD (coronary artery disease) 12/20/2010   Depression 12/20/2010   Family history of heart disease 12/20/2010   Foreign body 12/20/2010   Hyperlipemia 12/20/2010   Tobacco abuse  12/20/2010    Past Medical History:  Diagnosis Date   Arthritis    Asthma    Atypical chest pain    Barrett's esophagus    Bilateral cataracts    Chicken pox    Chronic constipation    Coronary artery disease 12/20/2010   Depression    Foreign body (FB) in soft tissue    Former tobacco use    GERD (gastroesophageal reflux disease)    History of 2019 novel coronavirus disease (COVID-19) 04/07/2019   Hyperlipidemia    Hyperplastic colon polyp    Ischemic heart disease 12/20/2010   MS (multiple sclerosis) ([place])    Shingles    Shingles    Tubular adenoma of colon 08/23/2016     Transfusion:    Consultants (if any):   Discharged Condition: Improved  Hospital Course: Allen Tapia is an 65 y.o. Allen who was admitted 10/05/2020 with a diagnosis of right knee osteoarthritis and went to the operating room on 10/05/2020 and underwent right total knee arthroplasty. The patient received perioperative antibiotics for prophylaxis (see below). The patient tolerated the procedure well and was transported to PACU in stable condition. After meeting PACU criteria, the patient was subsequently transferred to the Orthopaedics/Rehabilitation unit.   The patient received DVT prophylaxis in the form of early mobilization, Lovenox, Foot Pumps, and TED hose. A sacral pad had been placed and heels were elevated off of the bed with rolled towels in order to protect skin integrity. Foley catheter was discontinued on postoperative day #0. Wound drains were discontinued on postoperative day #1. The surgical incision was healing well without signs of infection.  Physical therapy was initiated postoperatively for transfers, gait training, and strengthening. Occupational therapy was initiated for activities of daily living and evaluation for assisted devices.  Rehabilitation goals were reviewed in detail with the patient. The patient made steady progress with physical therapy and physical therapy recommended  discharge to Home.   The patient achieved the preliminary goals of this hospitalization and was felt to be medically and orthopaedically appropriate for discharge.  He was given perioperative antibiotics:  Anti-infectives (From admission, onward)    Start     Dose/Rate Route Frequency Ordered Stop   10/05/20 1845  ceFAZolin (ANCEF) IVPB 2g/100 mL premix        2 g 200 mL/hr over 30 Minutes Intravenous Every 6 hours 10/05/20 1754 10/06/20 0108   10/05/20 0948  ceFAZolin (ANCEF) 2-4 GM/100ML-% IVPB       Note to Pharmacy: Lyman Bishop   : cabinet override      10/05/20 0948 10/06/20 0042   10/05/20 0600  ceFAZolin (ANCEF) IVPB 2g/100 mL premix        2 g 200 mL/hr over 30 Minutes Intravenous On call to O.R. 10/05/20 0151 10/05/20 1235     .  Recent vital signs:  Vitals:   10/06/20 1144 10/06/20 1619  BP: 111/61 98/76  Pulse: 65 64  Resp: 17 20  Temp: 97.9 F (36.6 C) 98.3 F (36.8 C)  SpO2: 96% 95%    Recent laboratory studies:  No results for input(s): WBC, HGB, HCT, PLT, K, CL, CO2, BUN, CREATININE, GLUCOSE, CALCIUM, LABPT, INR in the last 72 hours.  Diagnostic Studies: DG Knee Right Port  Result Date: 10/05/2020 CLINICAL DATA:  Postop total knee arthroplasty. EXAM: PORTABLE RIGHT KNEE - 1-2 VIEW COMPARISON:  MRI 06/20/2015. FINDINGS: The patient has undergone interval right total knee arthroplasty. The hardware appears well positioned. A surgical drain is in place. A small amount of air is present within the joint and soft tissues surrounding the knee. There is no evidence of acute fracture or subluxation. IMPRESSION: No demonstrated complication related to right total knee arthroplasty. Electronically Signed   By: Richardean Sale M.D.   On: 10/05/2020 16:48    Discharge Medications:   Allergies as of 10/06/2020   No Known Allergies      Medication List     STOP taking these medications    aspirin EC 81 MG tablet   ibuprofen 800 MG tablet Commonly known as:  ADVIL       TAKE these medications    acetaminophen 500 MG tablet Commonly known as: TYLENOL Take 1,000 mg by mouth every 6 (six) hours as needed for moderate pain.   AeroChamber MV inhaler Use as instructed   albuterol 108 (90 Base) MCG/ACT inhaler Commonly known as: VENTOLIN HFA Inhale 2 puffs into the lungs every 6 (six) hours as needed for wheezing or shortness of breath.   amphetamine-dextroamphetamine 25 MG 24 hr capsule Commonly known as: ADDERALL XR Take 25 mg by mouth daily as needed (focus).   b complex vitamins capsule Take 1 capsule by mouth daily.   baclofen 20 MG tablet Commonly known as: LIORESAL Take 20 mg by mouth 3 (three) times daily.   buPROPion 150 MG 24 hr tablet Commonly known as: WELLBUTRIN XL Take 150 mg by mouth daily.   celecoxib 200 MG capsule Commonly known as: CELEBREX Take 1 capsule (200 mg total) by mouth 2 (two) times daily.   cholecalciferol 1000 units tablet Commonly known as: VITAMIN D Take 3,000 Units by mouth daily.   diphenhydrAMINE 25 MG tablet Commonly known as: BENADRYL Take 25 mg by mouth at bedtime.   enoxaparin 40 MG/0.4ML  injection Commonly known as: LOVENOX Inject 0.4 mLs (40 mg total) into the skin daily for 14 days.   Fingolimod HCl 0.5 MG Caps Take 0.5 mg by mouth every morning.   gabapentin 300 MG capsule Commonly known as: NEURONTIN Take 300 mg by mouth 3 (three) times daily.   mirabegron ER 50 MG Tb24 tablet Commonly known as: MYRBETRIQ Take 1 tablet (50 mg total) by mouth daily.   montelukast 10 MG tablet Commonly known as: SINGULAIR Take 1 tablet (10 mg total) by mouth at bedtime.   multivitamin with minerals tablet Take 1 tablet by mouth daily.   omeprazole 20 MG capsule Commonly known as: PRILOSEC Take 20 mg by mouth daily.   oxyCODONE 5 MG immediate release tablet Commonly known as: Oxy IR/ROXICODONE Take 1 tablet (5 mg total) by mouth every 4 (four) hours as needed for moderate pain  (pain score 4-6).   traZODone 100 MG tablet Commonly known as: DESYREL Take 100 mg by mouth at bedtime.       ASK your doctor about these medications    budesonide-formoterol 160-4.5 MCG/ACT inhaler Commonly known as: Symbicort Inhale 2 puffs into the lungs in the morning and at bedtime.   rosuvastatin 20 MG tablet Commonly known as: CRESTOR Take 1 tablet (20 mg total) by mouth daily.               Durable Medical Equipment  (From admission, onward)           Start     Ordered   10/05/20 1755  DME Walker rolling  Once       Question:  Patient needs a walker to treat with the following condition  Answer:  Total knee replacement status   10/05/20 1754   10/05/20 1755  DME Bedside commode  Once       Question:  Patient needs a bedside commode to treat with the following condition  Answer:  Total knee replacement status   10/05/20 1754            Disposition: Home with home health PT     Follow-up Information     Fausto Skillern, PA-C Follow up on 10/20/2020.   Specialty: Orthopedic Surgery Why: at 9:45am Contact information: [place] [address] [phone]         Dereck Leep, MD Follow up on 11/17/2020.   Specialty: Orthopedic Surgery Why: at 1:30pm Contact information: [place] [address] [place], PA-C 10/06/2020, 6:24 PM

## 2020-10-06 NOTE — Plan of Care (Addendum)
Patient discharged home per MD orders at this time.All discharge instructions,education and medications reviewed with Pt and wife.patient expressed understanding and will comply with dc instructions.follow up appointments was also communicated to patient.no verbal c/o or ssx of any distress.Pt discharged home with HH/PT services per order.Pt was transported home in a private car by wife.

## 2020-10-06 NOTE — Progress Notes (Signed)
Met with the patient to discuss DC plan and needs He lives at home with his wife and has DME at home such as 3 in 1, and rolling walker He has transportation and can afford his medication He is set up with [facility], Stacie is aware

## 2020-10-19 ENCOUNTER — Encounter: Payer: Medicare Other | Admitting: Podiatry

## 2020-11-07 ENCOUNTER — Other Ambulatory Visit: Payer: Self-pay

## 2020-11-07 DIAGNOSIS — N3281 Overactive bladder: Secondary | ICD-10-CM

## 2020-11-07 MED ORDER — MIRABEGRON ER 50 MG PO TB24: 50.0000 mg | ORAL_TABLET | Freq: Every day | ORAL | 3 refills | Status: DC

## 2020-11-07 NOTE — Telephone Encounter (Signed)
Message on triage line from [facility] requesting Myrbetriq script be sent to mail order per patient request. Script was sent

## 2020-11-18 ENCOUNTER — Other Ambulatory Visit: Payer: Self-pay | Admitting: Urology

## 2020-11-18 DIAGNOSIS — N138 Other obstructive and reflux uropathy: Secondary | ICD-10-CM

## 2020-11-22 ENCOUNTER — Other Ambulatory Visit: Payer: Self-pay

## 2020-11-22 ENCOUNTER — Encounter: Payer: Self-pay | Admitting: Emergency Medicine

## 2020-11-22 ENCOUNTER — Ambulatory Visit
Admission: EM | Admit: 2020-11-22 | Discharge: 2020-11-22 | Disposition: A | Discharge status: Home / Self Care | Payer: Medicare Other | Attending: Internal Medicine | Admitting: Internal Medicine

## 2020-11-22 DIAGNOSIS — H10021 Other mucopurulent conjunctivitis, right eye: Secondary | ICD-10-CM

## 2020-11-22 MED ORDER — SULFACETAMIDE SODIUM 10 % OP SOLN: 1.0000 [drp] | OPHTHALMIC | 0 refills | Status: DC

## 2020-11-22 NOTE — ED Provider Notes (Signed)
Allen Tapia    CSN: [id number] Arrival date & time: 11/22/20  [id number]      History   Chief Complaint Chief Complaint  Patient presents with   Eye Problem    HPI Allen Tapia is a 65 y.o. male who presents with R eye cream color drainage off and on x 3 weeks. Denies pain. Has been using lubrication drops for dry eyes. Denies redness or itching of R eye. At times gets some vision changes. He saw his eye MD 2 months ago and believes his vision was better than what it is today.     Past Medical History:  Diagnosis Date   Arthritis    Asthma    Atypical chest pain    Barrett's esophagus    Bilateral cataracts    Chicken pox    Chronic constipation    Coronary artery disease 12/20/2010   Depression    Foreign body (FB) in soft tissue    Former tobacco use    GERD (gastroesophageal reflux disease)    History of 2019 novel coronavirus disease (COVID-19) 04/07/2019   Hyperlipidemia    Hyperplastic colon polyp    Ischemic heart disease 12/20/2010   MS (multiple sclerosis) ([place])    Shingles    Shingles    Tubular adenoma of colon 08/23/2016    Patient Active Problem List   Diagnosis Date Noted   Total knee replacement status 10/05/2020   Primary osteoarthritis of right knee 06/05/2020   Mild intermittent asthma 12/07/2019   Pure hypercholesterolemia 05/27/2019   Status post laser cataract surgery, left 02/11/2019   Barrett's esophagus 10/10/2016   Chronic constipation 10/10/2016   Ankylosis of right elbow joint 04/11/2015   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   Encounter for long-term (current) use of other medications 06/08/2013   Dyspnea, unspecified 04/07/2012   Multiple sclerosis ([place]) 01/31/2012   Acid reflux 12/20/2010   CAD (coronary artery disease) 12/20/2010   Depression 12/20/2010   Family history of heart disease 12/20/2010   Foreign body 12/20/2010   Hyperlipemia 12/20/2010   Tobacco  abuse 12/20/2010    Past Surgical History:  Procedure Laterality Date   [place] Right 01/02/2011   partial meniscectomy    CARDIAC CATHETERIZATION     CATARACT EXTRACTION, BILATERAL  03/12/2019   COLONOSCOPY  02/05/2008   COLONOSCOPY WITH PROPOFOL N/A 08/23/2016   Procedure: COLONOSCOPY WITH PROPOFOL;  Surgeon: Lollie Sails, MD;  Location: [facility] ENDOSCOPY;  Service: Endoscopy;  Laterality: N/A;   COLONOSCOPY WITH PROPOFOL N/A 04/29/2020   Procedure: COLONOSCOPY WITH PROPOFOL;  Surgeon: Lesly Rubenstein, MD;  Location: [facility] ENDOSCOPY;  Service: Endoscopy;  Laterality: N/A;   CYSTOSCOPY WITH INSERTION OF UROLIFT N/A 05/20/2020   Procedure: CYSTOSCOPY WITH INSERTION OF UROLIFT;  Surgeon: Billey Co, MD;  Location: [facility] ORS;  Service: Urology;  Laterality: N/A;   ESOPHAGOGASTRODUODENOSCOPY (EGD) WITH PROPOFOL N/A 08/23/2016   Procedure: ESOPHAGOGASTRODUODENOSCOPY (EGD) WITH PROPOFOL;  Surgeon: Lollie Sails, MD;  Location: [facility] ENDOSCOPY;  Service: Endoscopy;  Laterality: N/A;   ESOPHAGOGASTRODUODENOSCOPY (EGD) WITH PROPOFOL N/A 04/29/2020   Procedure: ESOPHAGOGASTRODUODENOSCOPY (EGD) WITH PROPOFOL;  Surgeon: Lesly Rubenstein, MD;  Location: [facility] ENDOSCOPY;  Service: Endoscopy;  Laterality: N/A;   ESOPHAGOGASTRODUODENOSCOPY ENDOSCOPY  02/05/2008   EYE SURGERY     eye trauma  1970   piece of metal to eye removed in clinic   FRACTURE SURGERY  Right    wrist    FRACTURE SURGERY Right    knee; partial meniscectomy   KNEE ARTHROPLASTY Right 10/05/2020   Procedure: COMPUTER ASSISTED TOTAL KNEE ARTHROPLASTY;  Surgeon: Dereck Leep, MD;  Location: [facility] ORS;  Service: Orthopedics;  Laterality: Right;   right wrist surgery     SEPTOPLASTY     TONSILLECTOMY  02/05/2008       Home Medications    Prior to Admission medications   Medication Sig Start Date End Date Taking? Authorizing Provider  b complex vitamins capsule Take 1  capsule by mouth daily.   Yes [provider]  baclofen (LIORESAL) 20 MG tablet Take 20 mg by mouth 3 (three) times daily.   Yes [provider]  buPROPion (WELLBUTRIN XL) 150 MG 24 hr tablet Take 150 mg by mouth daily. 12/16/18  Yes [provider]  cholecalciferol (VITAMIN D) 1000 units tablet Take 3,000 Units by mouth daily.   Yes [provider]  Fingolimod HCl 0.5 MG CAPS Take 0.5 mg by mouth every morning.   Yes [provider]  gabapentin (NEURONTIN) 300 MG capsule Take 300 mg by mouth 3 (three) times daily. 12/26/18  Yes [provider]  mirabegron ER (MYRBETRIQ) 50 MG TB24 tablet Take 1 tablet (50 mg total) by mouth daily. 11/07/20  Yes Billey Co, MD  Multiple Vitamins-Minerals (MULTIVITAMIN WITH MINERALS) tablet Take 1 tablet by mouth daily.   Yes [provider]  omeprazole (PRILOSEC) 20 MG capsule Take 20 mg by mouth daily. 03/22/19  Yes [provider]  sulfacetamide (BLEPH-10) 10 % ophthalmic solution Place 1-2 drops into the left eye every 4 (four) hours. X 7 days 11/22/20  Yes Rodriguez-Southworth, Sunday Spillers, PA-C  traZODone (DESYREL) 100 MG tablet Take 100 mg by mouth at bedtime.   Yes [provider]  acetaminophen (TYLENOL) 500 MG tablet Take 1,000 mg by mouth every 6 (six) hours as needed for moderate pain.    [provider]  albuterol (VENTOLIN HFA) 108 (90 Base) MCG/ACT inhaler Inhale 2 puffs into the lungs every 6 (six) hours as needed for wheezing or shortness of breath. 12/08/19   Martyn Ehrich, NP  amphetamine-dextroamphetamine (ADDERALL XR) 25 MG 24 hr capsule Take 25 mg by mouth daily as needed (focus).    [provider]  budesonide-formoterol (SYMBICORT) 160-4.5 MCG/ACT inhaler Inhale 2 puffs into the lungs in the morning and at bedtime. Patient taking differently: Inhale 1 puff into the lungs 2 (two) times daily. 03/15/20   Martyn Ehrich, NP  diphenhydrAMINE  (BENADRYL) 25 MG tablet Take 25 mg by mouth at bedtime.    [provider]  montelukast (SINGULAIR) 10 MG tablet Take 1 tablet (10 mg total) by mouth at bedtime. 06/06/20   Martyn Ehrich, NP  rosuvastatin (CRESTOR) 20 MG tablet Take 1 tablet (20 mg total) by mouth daily. Patient taking differently: Take 20 mg by mouth at bedtime. 05/09/20 05/04/21  Buford Dresser, MD  Spacer/Aero-Holding Josiah Lobo (AEROCHAMBER MV) inhaler Use as instructed 06/11/18   Garner Nash, DO    Family History Family History  Problem Relation Age of Onset   Heart disease Mother    Glaucoma Mother    Irregular heart beat Mother    Cardiomyopathy Mother    Colon polyps Mother    Cataracts Mother    Hypertension Father    Heart disease Father    Cancer Father    Esophageal cancer Cousin  Social History Social History   Tobacco Use   Smoking status: Former    Packs/day: 1.00    Years: 20.00    Pack years: 20.00    Types: Cigarettes    Start date: 44    Quit date: 09/23/2006    Years since quitting: 14.1   Smokeless tobacco: Never  Vaping Use   Vaping Use: Never used  Substance Use Topics   Alcohol use: No   Drug use: No     Allergies   Patient has no known allergies.   Review of Systems Review of Systems  Eyes:  Positive for discharge and visual disturbance. Negative for photophobia, pain, redness and itching.  Skin:  Negative for rash.    Physical Exam Triage Vital Signs ED Triage Vitals [11/22/20 0909]  Enc Vitals Group     BP      Pulse      Resp      Temp      Temp src      SpO2      Weight      Height      Head Circumference      Peak Flow      Pain Score 1     Pain Loc      Pain Edu?      Excl. in [place]?    No data found.  Updated Vital Signs BP (!) 143/84 (BP Location: Left Arm)   Pulse 74   Temp 98.7 F (37.1 C) (Oral)   Resp 20   SpO2 94%   Visual Acuity Right Eye Distance: 20/40 Left Eye Distance: 20/40 Bilateral  Distance: 20/30  Right Eye Near:   Left Eye Near:    Bilateral Near:     Physical Exam Vitals and nursing note reviewed.  Constitutional:      General: He is not in acute distress.    Appearance: He is normal weight. He is not toxic-appearing.  HENT:     Nose: Nose normal.  Eyes:     General: No scleral icterus.       Right eye: Discharge present.        Left eye: No discharge.     Extraocular Movements: Extraocular movements intact.     Conjunctiva/sclera: Conjunctivae normal.     Pupils: Pupils are equal, round, and reactive to light.     Comments: Has cream color matter from the R eye present   Musculoskeletal:     Cervical back: Neck supple.  Neurological:     Mental Status: He is alert.     UC Treatments / Results  Labs (all labs ordered are listed, but only abnormal results are displayed) Labs Reviewed - No data to display  EKG   Radiology No results found.  Procedures Procedures (including critical care time)  Medications Ordered in UC Medications - No data to display  Initial Impression / Assessment and Plan / UC Course  I have reviewed the triage vital signs and the nursing notes. R bacterial conjunctivitis Decreased vision I placed him on Sodium sulamid eye drops as noted, and encouraged to see his eye MD since his vision has decreased.     Final Clinical Impressions(s) / UC Diagnoses   Final diagnoses:  Other mucopurulent conjunctivitis of right eye     Discharge Instructions      Please follow up with your eye doctor if you dont get better     ED Prescriptions     Medication Sig Dispense  Auth. Provider   sulfacetamide (BLEPH-10) 10 % ophthalmic solution Place 1-2 drops into the left eye every 4 (four) hours. X 7 days 15 mL Rodriguez-Southworth, Sunday Spillers, PA-C      PDMP not reviewed this encounter.   Shelby Mattocks, [place] 11/22/20 [phone]

## 2020-11-22 NOTE — Discharge Instructions (Addendum)
Please follow up with your eye doctor if you dont get better

## 2020-11-22 NOTE — ED Triage Notes (Signed)
Right eye drainage, no pain.  Reports drainage for 3 weeks.

## 2020-11-23 ENCOUNTER — Telehealth: Payer: Self-pay

## 2020-11-23 MED ORDER — POLYMYXIN B-TRIMETHOPRIM 10000-0.1 UNIT/ML-% OP SOLN: 1.0000 [drp] | Freq: Four times a day (QID) | OPHTHALMIC | 0 refills | Status: AC

## 2020-11-25 ENCOUNTER — Other Ambulatory Visit: Payer: Self-pay | Admitting: Primary Care

## 2020-12-28 ENCOUNTER — Other Ambulatory Visit: Payer: Self-pay | Admitting: Orthopedic Surgery

## 2020-12-28 DIAGNOSIS — M545 Low back pain, unspecified: Secondary | ICD-10-CM

## 2020-12-30 ENCOUNTER — Other Ambulatory Visit: Payer: Self-pay

## 2020-12-30 ENCOUNTER — Ambulatory Visit
Admission: RE | Admit: 2020-12-30 | Discharge: 2020-12-30 | Disposition: A | Discharge status: Home / Self Care | Payer: Medicare Other | Source: Ambulatory Visit | Attending: Orthopedic Surgery | Admitting: Orthopedic Surgery

## 2020-12-30 DIAGNOSIS — M545 Low back pain, unspecified: Secondary | ICD-10-CM | POA: Diagnosis not present

## 2020-12-30 DIAGNOSIS — G8929 Other chronic pain: Secondary | ICD-10-CM | POA: Insufficient documentation

## 2021-01-03 ENCOUNTER — Other Ambulatory Visit (INDEPENDENT_AMBULATORY_CARE_PROVIDER_SITE_OTHER): Payer: Self-pay | Admitting: Nurse Practitioner

## 2021-01-03 DIAGNOSIS — I7143 Infrarenal abdominal aortic aneurysm, without rupture: Secondary | ICD-10-CM

## 2021-01-04 ENCOUNTER — Telehealth (INDEPENDENT_AMBULATORY_CARE_PROVIDER_SITE_OTHER): Payer: Self-pay | Admitting: Vascular Surgery

## 2021-01-04 NOTE — Telephone Encounter (Signed)
Called patient to make him aware of providers instructions: FB put in an order for CTA and if Radiology hadnt called patient to schedule by Thursday 01/05/21 patient needs to call [facility] to make [place] aware. I scheduled patient for a follow up with JD Friday 01/06/21. Patient called back to make me aware he understood instructions.   This note is for documentation purposes only.

## 2021-01-05 ENCOUNTER — Ambulatory Visit
Admission: RE | Admit: 2021-01-05 | Discharge: 2021-01-05 | Disposition: A | Discharge status: Home / Self Care | Payer: Medicare Other | Source: Ambulatory Visit | Attending: Nurse Practitioner | Admitting: Nurse Practitioner

## 2021-01-05 ENCOUNTER — Other Ambulatory Visit: Payer: Self-pay

## 2021-01-05 DIAGNOSIS — I7143 Infrarenal abdominal aortic aneurysm, without rupture: Secondary | ICD-10-CM | POA: Diagnosis present

## 2021-01-05 LAB — POCT I-STAT CREATININE: Creatinine, Ser: 0.7 mg/dL (ref 0.61–1.24)

## 2021-01-05 MED ORDER — IOHEXOL 350 MG/ML SOLN
75.0000 mL | Freq: Once | INTRAVENOUS | Status: AC | PRN
  Administered 2021-01-05: 75 mL via INTRAVENOUS

## 2021-01-06 ENCOUNTER — Ambulatory Visit (INDEPENDENT_AMBULATORY_CARE_PROVIDER_SITE_OTHER): Payer: Medicare Other | Admitting: Vascular Surgery

## 2021-01-06 ENCOUNTER — Encounter (INDEPENDENT_AMBULATORY_CARE_PROVIDER_SITE_OTHER): Payer: Self-pay | Admitting: Vascular Surgery

## 2021-01-06 VITALS — BP 139/78 | HR 67 | Resp 16 | Wt 209.0 lb

## 2021-01-06 DIAGNOSIS — I2510 Atherosclerotic heart disease of native coronary artery without angina pectoris: Secondary | ICD-10-CM | POA: Diagnosis not present

## 2021-01-06 DIAGNOSIS — I7143 Infrarenal abdominal aortic aneurysm, without rupture: Secondary | ICD-10-CM

## 2021-01-06 DIAGNOSIS — E785 Hyperlipidemia, unspecified: Secondary | ICD-10-CM | POA: Diagnosis not present

## 2021-01-06 DIAGNOSIS — I714 Abdominal aortic aneurysm, without rupture, unspecified: Secondary | ICD-10-CM | POA: Insufficient documentation

## 2021-01-06 NOTE — Assessment & Plan Note (Signed)
Continue cardiac and antihypertensive medications as already ordered and reviewed, no changes at this time. Continue statin as ordered and reviewed, no changes at this time Nitrates PRN for chest pain  

## 2021-01-06 NOTE — Assessment & Plan Note (Signed)
lipid control important in reducing the progression of atherosclerotic disease. Continue statin therapy  

## 2021-01-06 NOTE — Progress Notes (Signed)
Patient ID: Allen Tapia, male   DOB: [birth year], 65 y.o.   MRN: [id number]  Chief Complaint  Patient presents with   New Patient (Initial Visit)    Ref [place] abnormal aorta seen on MRI    HPI Allen Tapia is a 65 y.o. male.  I am asked to see the patient by Dr. 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  This was an incidental finding on MRI of his back.  He has had knee replacement and left hip pain and this was part of the work-up for back and hip pain.  Following the MRI, he had a CT angiogram of the abdomen pelvis this week which I have independently reviewed.  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  In visualizing this, it is at and just below the level of a penetrating ulcer which is likely the cause of this aneurysm.  The saccular aneurysm is actually roughly the same size as his native aorta proximal and distal and it has the appearance of a double barrel shotgun on the scan.  He only has mild atherosclerotic changes.  No iliac artery aneurysm.  Fairly clean femoral arteries.  This starts a couple of centimeters below the renal arteries.     Past Medical History:  Diagnosis Date   Arthritis    Asthma    Atypical chest pain    Barrett's esophagus    Bilateral cataracts    Chicken pox    Chronic constipation    Coronary artery disease 12/20/2010   Depression    Foreign body (FB) in soft tissue    Former tobacco use    GERD (gastroesophageal reflux disease)    History of 2019 novel coronavirus disease (COVID-19) 04/07/2019   Hyperlipidemia    Hyperplastic colon polyp    Ischemic heart disease 12/20/2010   MS (multiple sclerosis) ([place])    Shingles    Shingles    Tubular adenoma of colon 08/23/2016    Past Surgical History:  Procedure Laterality Date   [place] Right 01/02/2011   partial meniscectomy    CARDIAC CATHETERIZATION     CATARACT EXTRACTION, BILATERAL   03/12/2019   COLONOSCOPY  02/05/2008   COLONOSCOPY WITH PROPOFOL N/A 08/23/2016   Procedure: COLONOSCOPY WITH PROPOFOL;  Surgeon: Lollie Sails, MD;  Location: [facility] ENDOSCOPY;  Service: Endoscopy;  Laterality: N/A;   COLONOSCOPY WITH PROPOFOL N/A 04/29/2020   Procedure: COLONOSCOPY WITH PROPOFOL;  Surgeon: Lesly Rubenstein, MD;  Location: [facility] ENDOSCOPY;  Service: Endoscopy;  Laterality: N/A;   CYSTOSCOPY WITH INSERTION OF UROLIFT N/A 05/20/2020   Procedure: CYSTOSCOPY WITH INSERTION OF UROLIFT;  Surgeon: Billey Co, MD;  Location: [facility] ORS;  Service: Urology;  Laterality: N/A;   ESOPHAGOGASTRODUODENOSCOPY (EGD) WITH PROPOFOL N/A 08/23/2016   Procedure: ESOPHAGOGASTRODUODENOSCOPY (EGD) WITH PROPOFOL;  Surgeon: Lollie Sails, MD;  Location[facility] ENDOSCOPY;  Service: Endoscopy;  Laterality: N/A;   ESOPHAGOGASTRODUODENOSCOPY (EGD) WITH PROPOFOL N/A 04/29/2020   Procedure: ESOPHAGOGASTRODUODENOSCOPY (EGD) WITH PROPOFOL;  Surgeon: Lesly Rubenstein, MD;  Location: [facility] ENDOSCOPY;  Service: Endoscopy;  Laterality: N/A;   ESOPHAGOGASTRODUODENOSCOPY ENDOSCOPY  02/05/2008   EYE SURGERY     eye trauma  1970   piece of metal to eye removed in clinic   FRACTURE SURGERY Right    wrist    FRACTURE SURGERY Right    knee; partial meniscectomy   KNEE ARTHROPLASTY Right 10/05/2020  Procedure: COMPUTER ASSISTED TOTAL KNEE ARTHROPLASTY;  Surgeon: Dereck Leep, MD;  Location: [facility] ORS;  Service: Orthopedics;  Laterality: Right;   right wrist surgery     SEPTOPLASTY     TONSILLECTOMY  02/05/2008     Family History  Problem Relation Age of Onset   Heart disease Mother    Glaucoma Mother    Irregular heart beat Mother    Cardiomyopathy Mother    Colon polyps Mother    Cataracts Mother    Hypertension Father    Heart disease Father    Cancer Father    Esophageal cancer Cousin       Social History   Tobacco Use   Smoking status: Former    Packs/day: 1.00    Years: 20.00     Pack years: 20.00    Types: Cigarettes    Start date: 62    Quit date: 09/23/2006    Years since quitting: 14.2   Smokeless tobacco: Never  Vaping Use   Vaping Use: Never used  Substance Use Topics   Alcohol use: No   Drug use: No     No Known Allergies  Current Outpatient Medications  Medication Sig Dispense Refill   acetaminophen (TYLENOL) 500 MG tablet Take 1,000 mg by mouth every 6 (six) hours as needed for moderate pain.     albuterol (VENTOLIN HFA) 108 (90 Base) MCG/ACT inhaler Inhale 2 puffs into the lungs every 6 (six) hours as needed for wheezing or shortness of breath. 24 g 3   amphetamine-dextroamphetamine (ADDERALL XR) 25 MG 24 hr capsule Take 25 mg by mouth daily as needed (focus).     aspirin EC 81 MG tablet Take 81 mg by mouth daily. Swallow whole.     b complex vitamins capsule Take 1 capsule by mouth daily.     baclofen (LIORESAL) 20 MG tablet Take 20 mg by mouth 3 (three) times daily.     budesonide-formoterol (SYMBICORT) 160-4.5 MCG/ACT inhaler Inhale 2 puffs into the lungs in the morning and at bedtime. (Patient taking differently: Inhale 1 puff into the lungs 2 (two) times daily.) 3 each 3   buPROPion (WELLBUTRIN XL) 150 MG 24 hr tablet Take 150 mg by mouth daily.     cholecalciferol (VITAMIN D) 1000 units tablet Take 3,000 Units by mouth daily.     diphenhydrAMINE (BENADRYL) 25 MG tablet Take 25 mg by mouth at bedtime.     Fingolimod HCl 0.5 MG CAPS Take 0.5 mg by mouth every morning.     gabapentin (NEURONTIN) 300 MG capsule Take 300 mg by mouth 3 (three) times daily.     mirabegron ER (MYRBETRIQ) 50 MG TB24 tablet Take 1 tablet (50 mg total) by mouth daily. 90 tablet 3   montelukast (SINGULAIR) 10 MG tablet TAKE 1 TABLET AT BEDTIME 90 tablet 0   Multiple Vitamins-Minerals (MULTIVITAMIN WITH MINERALS) tablet Take 1 tablet by mouth daily.     omeprazole (PRILOSEC) 20 MG capsule Take 20 mg by mouth daily.     rosuvastatin (CRESTOR) 20 MG tablet Take 1 tablet  (20 mg total) by mouth daily. (Patient taking differently: Take 20 mg by mouth at bedtime.) 90 tablet 3   Spacer/Aero-Holding Chambers (AEROCHAMBER MV) inhaler Use as instructed 1 each 0   traZODone (DESYREL) 100 MG tablet Take 100 mg by mouth at bedtime.     sulfacetamide (BLEPH-10) 10 % ophthalmic solution Place 1-2 drops into the left eye every 4 (four) hours. X 7 days (Patient  not taking: Reported on 01/06/2021) 15 mL 0   No current facility-administered medications for this visit.      REVIEW OF SYSTEMS (Negative unless checked)  Constitutional: [] Weight loss  [] Fever  [] Chills Cardiac: [x] Chest pain   [] Chest pressure   [] Palpitations   [] Shortness of breath when laying flat   [] Shortness of breath at rest   [] Shortness of breath with exertion. Vascular:  [] Pain in legs with walking   [] Pain in legs at rest   [] Pain in legs when laying flat   [] Claudication   [] Pain in feet when walking  [] Pain in feet at rest  [] Pain in feet when laying flat   [] History of DVT   [] Phlebitis   [] Swelling in legs   [] Varicose veins   [] Non-healing ulcers Pulmonary:   [] Uses home oxygen   [] Productive cough   [] Hemoptysis   [] Wheeze  [] COPD   [x] Asthma Neurologic:  [] Dizziness  [] Blackouts   [] Seizures   [] History of stroke   [] History of TIA  [] Aphasia   [] Temporary blindness   [] Dysphagia   [] Weakness or numbness in arms   [] Weakness or numbness in legs Musculoskeletal:  [x] Arthritis   [] Joint swelling   [] Joint pain   [] Low back pain Hematologic:  [] Easy bruising  [] Easy bleeding   [] Hypercoagulable state   [] Anemic  [] Hepatitis Gastrointestinal:  [] Blood in stool   [] Vomiting blood  [x] Gastroesophageal reflux/heartburn   [] Abdominal pain Genitourinary:  [] Chronic kidney disease   [] Difficult urination  [] Frequent urination  [] Burning with urination   [] Hematuria Skin:  [] Rashes   [] Ulcers   [] Wounds Psychological:  [] History of anxiety   [x]  History of major depression.    Physical Exam BP 139/78  (BP Location: Right Arm)   Pulse 67   Resp 16   Wt 209 lb (94.8 kg)   BMI 28.35 kg/m  Gen:  WD/WN, NAD. Appears younger than stated age.  Head: [place]/AT, No temporalis wasting.  Ear/Nose/Throat: Hearing grossly intact, nares w/o erythema or drainage, oropharynx w/o Erythema/Exudate Eyes: Conjunctiva clear, sclera non-icteric  Neck: trachea midline.  No JVD.  Pulmonary:  Good air movement, respirations not labored, no use of accessory muscles  Cardiac: RRR, no JVD Vascular:  Vessel Right Left  Radial Palpable Palpable                                   Gastrointestinal:. No masses, surgical incisions, or scars. Musculoskeletal: M/S 5/5 throughout.  Extremities without ischemic changes.  No deformity or atrophy. No edema. Neurologic: Sensation grossly intact in extremities.  Symmetrical.  Speech is fluent. Motor exam as listed above. Psychiatric: Judgment intact, Mood & affect appropriate for pt's clinical situation. Dermatologic: No rashes or ulcers noted.  No cellulitis or open wounds.    Radiology MR LUMBAR SPINE WO CONTRAST  Result Date: 01/03/2021 CLINICAL DATA:  Patient complains of low back pain on and off x 40 years and left hip/buttock pain x 6 months. No known injury. No history of surgery reported. EXAM: MRI LUMBAR SPINE WITHOUT CONTRAST TECHNIQUE: Multiplanar, multisequence MR imaging of the lumbar spine was performed. No intravenous contrast was administered. COMPARISON:  None. FINDINGS: Segmentation:  Standard. Alignment: 5 mL retrolisthesis L3 on L4. 3 mm retrolisthesis L2 on L3. Slight lumbar dextrocurvature. Vertebrae:  No fracture, evidence of discitis, or bone lesion. Conus medullaris and cauda equina: Conus extends to the L1 level. Conus and cauda equina appear normal. Paraspinal and other  soft tissues: Left-sided inferior vena cava, an anatomic variant. Approximately 2.2 cm rounded density abutting the infrarenal abdominal aorta, potentially representing a saccular  aneurysm (series 6, image 23). Evaluation of the vascular structures is limited by technique. Disc levels: T12-L1: Unremarkable. L1-L2: Mild annular disc bulge. Unremarkable facet joints. No foraminal or canal stenosis. L2-L3: Retrolisthesis with mild diffuse disc bulge. No significant facet joint arthropathy. Borderline-mild bilateral foraminal stenosis, left slightly greater than right. No canal stenosis. L3-L4: Retrolisthesis with disc osteophyte complex, eccentric to the right. Mild-moderate bilateral facet arthropathy. Right greater than left subarticular recess stenosis without canal stenosis. Severe right and moderate left foraminal stenosis. L4-L5: Minimal annular disc bulge. Minimal facet arthropathy. Mild bilateral foraminal stenosis. No canal stenosis. L5-S1: Mild diffuse disc osteophyte complex with minimal bilateral facet arthropathy. Borderline-mild bilateral foraminal stenosis. No canal stenosis. IMPRESSION: 1. Multilevel lumbar spondylosis, most pronounced at the L3-L4 level where there is severe right and moderate left foraminal stenosis. 2. No significant canal stenosis at any level. 3. Approximately 2.2 cm rounded density abutting the infrarenal abdominal aorta, potentially representing a saccular aneurysm, however evaluation of the vascular structures is limited by technique. Further evaluation with CT angiogram of the abdomen and pelvis is recommended. 4. Left-sided IVC, an anatomic variant. These results will be called to the ordering clinician or representative by the Radiologist Assistant, and communication documented in the PACS or [employer]. Electronically Signed   By: Davina Poke D.O.   On: 01/03/2021 13:30   CT Angio Abd/Pel w/ and/or w/o  Result Date: 01/05/2021 CLINICAL DATA:  Infrarenal abdominal aortic aneurysm. Aneurysm noted on lumbar spine MRI. EXAM: CTA ABDOMEN AND PELVIS WITHOUT AND WITH CONTRAST TECHNIQUE: Multidetector CT imaging of the abdomen and pelvis was  performed using the standard protocol during bolus administration of intravenous contrast. Multiplanar reconstructed images and MIPs were obtained and reviewed to evaluate the vascular anatomy. CONTRAST:  41mL OMNIPAQUE IOHEXOL 350 MG/ML SOLN COMPARISON:  None. FINDINGS: VASCULAR Aorta: Distal descending thoracic aorta measures 2.2 cm. Aorta at the level of the celiac artery measures 2.5 cm. Juxtarenal abdominal aorta measures 2.1 cm. Abdominal aorta is tortuous and swings towards the right. There is an infrarenal saccular aneurysm along the left side of the abdominal aorta that corresponds with the abnormality on the recent MRI. This aneurysm originates just below the takeoff of the IMA. Difficult to measure this aneurysm due to the unusual configuration but the aneurysm measures roughly 2.6 x 1.6 x 2.1 cm. Aneurysm contains a small amount of mural thrombus. There is also mild irregularity along the left side of the infrarenal abdominal aorta just above the IMA and there may be a small penetrating ulcer above the IMA on sequence 4, image 108. Normal caliber of the aorta at the bifurcation. Celiac: Celiac trunk is widely patent. Splenic artery, common hepatic artery and left gastric artery are patent. Focal ectasia in the distal splenic artery that measures up to 0.9 cm on sequence 4 image 63. SMA: Patent without evidence of aneurysm, dissection, vasculitis or significant stenosis. Renals: Both renal arteries are patent without evidence of aneurysm, dissection, vasculitis, fibromuscular dysplasia or significant stenosis. IMA: Patent Inflow: Patent without evidence of aneurysm, dissection, vasculitis or significant stenosis. Proximal Outflow: Proximal femoral arteries are patent bilaterally. Veins: Variant venous anatomy with a left-sided IVC. The IVC drains via the left renal vein. Review of the MIP images confirms the above findings. NON-VASCULAR Lower chest: Linear density in the posterior right lower lobe is  suggestive for scarring  or atelectasis. No pleural effusions. Hepatobiliary: Normal appearance of the liver and gallbladder. Pancreas: Unremarkable. No pancreatic ductal dilatation or surrounding inflammatory changes. Spleen: Normal in size without focal abnormality. Adrenals/Urinary Tract: Normal adrenal glands. 2.9 cm exophytic cyst in left kidney upper pole. 2.7 cm exophytic cyst in the right kidney lower pole. Stones in the right kidney interpolar region measuring up to 1.3 cm. Bladder is mildly distended. No suspicious renal lesions. No hydronephrosis. Stomach/Bowel: Colonic diverticulosis without acute colonic inflammation. No evidence for bowel obstruction. Normal appearance of stomach. Lymphatic: No lymph node enlargement in the abdomen or pelvis. Reproductive: Evidence for fiducial markers or radiation seeds in the prostate. Other: Negative for free air. Small umbilical hernia containing fat. Musculoskeletal: Retrolisthesis of L3 on L4. Degenerative facet disease in lumbar spine. IMPRESSION: VASCULAR 1. 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 The aneurysm is located along the left side of the abdominal aorta with a maximum dimension of 2.6 cm. Recommend referral to a vascular specialist. This recommendation follows ACR consensus guidelines: White Paper of the ACR Incidental Findings Committee II on Vascular Findings. J Am Coll Radiol 2013; 10:789-794. 2. 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 3. Focal ectasia in the distal splenic artery measuring 0.9 cm. 4. Left-sided IVC. NON-VASCULAR 1. Right nephrolithiasis.  Negative for hydronephrosis. 2. Bilateral renal cysts. 3. Small umbilical hernia containing fat. Electronically Signed   By: Markus Daft M.D.   On: 01/05/2021 14:47    Labs Recent Results (from the past 2160 hour(s))  I-STAT creatinine     Status: None   Collection Time:  01/05/21  1:09 PM  Result Value Ref Range   Creatinine, Ser 0.70 0.61 - 1.24 mg/dL    Assessment/Plan:  AAA (abdominal aortic aneurysm) without rupture he had a CT angiogram of the abdomen pelvis this week which I have independently reviewed.  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  In visualizing this, it is at and just below the level of a penetrating ulcer which is likely the cause of this aneurysm.  The saccular aneurysm is actually roughly the same size as his native aorta proximal and distal and it has the appearance of a double barrel shotgun on the scan.  He only has mild atherosclerotic changes.  No iliac artery aneurysm.  Fairly clean femoral arteries.  This starts a couple of centimeters below the renal arteries.   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  With saccular aneurysms, we do not use a 5 cm cut off as we do with a fusiform aneurysm.  It is hard to tell how chronic this area is, but it could certainly be something that has occurred more recently.  These are far less stable than fusiform aneurysms and repair at this time would be a reasonable option.  Another option will be a short interval follow-up of 2 to 3 months with a duplex.  At this time, he wants to go home and discuss with his wife who is also retired Dietitian which is very reasonable.  We will go ahead and slot him for a 29-month follow-up visit with duplex unless he decides to go and have repair.  Hyperlipemia lipid control important in reducing the progression of atherosclerotic disease. Continue statin therapy   CAD (coronary artery disease) Continue cardiac and antihypertensive medications as already ordered and reviewed, no  changes at this time.  Continue statin as ordered and reviewed, no changes at this time  Nitrates PRN for chest pain      Leotis Pain 01/06/2021, 12:34 PM   This note was created with Dragon medical  transcription system.  Any errors from dictation are unintentional.

## 2021-01-06 NOTE — Assessment & Plan Note (Signed)
he had a CT angiogram of the abdomen pelvis this week which I have independently reviewed.  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  In visualizing this, it is at and just below the level of a penetrating ulcer which is likely the cause of this aneurysm.  The saccular aneurysm is actually roughly the same size as his native aorta proximal and distal and it has the appearance of a double barrel shotgun on the scan.  He only has mild atherosclerotic changes.  No iliac artery aneurysm.  Fairly clean femoral arteries.  This starts a couple of centimeters below the renal arteries.   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  With saccular aneurysms, we do not use a 5 cm cut off as we do with a fusiform aneurysm.  It is hard to tell how chronic this area is, but it could certainly be something that has occurred more recently.  These are far less stable than fusiform aneurysms and repair at this time would be a reasonable option.  Another option will be a short interval follow-up of 2 to 3 months with a duplex.  At this time, he wants to go home and discuss with his wife who is also retired Dietitian which is very reasonable.  We will go ahead and slot him for a 40-month follow-up visit with duplex unless he decides to go and have repair.

## 2021-01-18 ENCOUNTER — Telehealth: Payer: Self-pay

## 2021-01-18 DIAGNOSIS — N3281 Overactive bladder: Secondary | ICD-10-CM

## 2021-01-18 DIAGNOSIS — N401 Enlarged prostate with lower urinary tract symptoms: Secondary | ICD-10-CM

## 2021-01-18 MED ORDER — TAMSULOSIN HCL 0.4 MG PO CAPS: 0.4000 mg | ORAL_CAPSULE | Freq: Every day | ORAL | 2 refills | Status: DC

## 2021-01-18 NOTE — Telephone Encounter (Signed)
Incoming call on triage line from patient requesting 90 day supply of tamsulosin be sent to his pharmacy. Per the phone note dated 10/04/20, ok for patient to take with Myrbetriq. Refill sent.

## 2021-01-25 ENCOUNTER — Encounter: Payer: Self-pay | Admitting: Vascular Surgery

## 2021-01-25 ENCOUNTER — Other Ambulatory Visit: Payer: Self-pay

## 2021-01-25 ENCOUNTER — Ambulatory Visit (INDEPENDENT_AMBULATORY_CARE_PROVIDER_SITE_OTHER): Payer: Medicare Other | Admitting: Vascular Surgery

## 2021-01-25 VITALS — BP 122/76 | HR 63 | Temp 98.3°F | Resp 20 | Ht 72.0 in | Wt 205.0 lb

## 2021-01-25 DIAGNOSIS — Z419 Encounter for procedure for purposes other than remedying health state, unspecified: Secondary | ICD-10-CM

## 2021-01-25 DIAGNOSIS — I7143 Infrarenal abdominal aortic aneurysm, without rupture: Secondary | ICD-10-CM

## 2021-01-25 NOTE — Progress Notes (Signed)
Patient ID: EDRIK Tapia, male   DOB: 1956/02/10, 65 y.o.   MRN: [id number]  Reason for Consult: New Patient (Initial Visit)   Referred by Derinda Late, MD  Subjective:     HPI:  Allen Tapia is a 65 y.o. 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  He has been evaluated [place] patient is hopeful to have his surgery at [facility] health and has been referred to [place] for further evaluation and second opinion.  He does not have any new back or abdominal pain.  He has never had abdominal surgery other than appendectomy that was performed laparoscopically.  He is a former smoker but quit many years ago.  He takes aspirin but no blood thinners.  He has had a cardiac catheterization in the past with no intervention.  [facility] is [facility]  Past Medical History:  Diagnosis Date   Arthritis    Asthma    Atypical chest pain    Barrett's esophagus    Bilateral cataracts    Chicken pox    Chronic constipation    Coronary artery disease 12/20/2010   Depression    Foreign body (FB) in soft tissue    Former tobacco use    GERD (gastroesophageal reflux disease)    History of 2019 novel coronavirus disease (COVID-19) 04/07/2019   Hyperlipidemia    Hyperplastic colon polyp    Ischemic heart disease 12/20/2010   MS (multiple sclerosis) (HCC)    Shingles    Shingles    Tubular adenoma of colon 08/23/2016   Family History  Problem Relation Age of Onset   Heart disease Mother    Glaucoma Mother    Irregular heart beat Mother    Cardiomyopathy Mother    Colon polyps Mother    Cataracts Mother    Hypertension Father    Heart disease Father    Cancer Father    Esophageal cancer Cousin    Past Surgical History:  Procedure Laterality Date   ADENOIDECTOMY  1960   APPENDECTOMY  1979   ARTHROPLASTY Right 01/02/2011   partial  meniscectomy    CARDIAC CATHETERIZATION     CATARACT EXTRACTION, BILATERAL  03/12/2019   COLONOSCOPY  02/05/2008   COLONOSCOPY WITH PROPOFOL N/A 08/23/2016   Procedure: COLONOSCOPY WITH PROPOFOL;  Surgeon: Lollie Sails, MD;  Location: [facility] ENDOSCOPY;  Service: Endoscopy;  Laterality: N/A;   COLONOSCOPY WITH PROPOFOL N/A 04/29/2020   Procedure: COLONOSCOPY WITH PROPOFOL;  Surgeon: Lesly Rubenstein, MD;  Location: [facility] ENDOSCOPY;  Service: Endoscopy;  Laterality: N/A;   CYSTOSCOPY WITH INSERTION OF UROLIFT N/A 05/20/2020   Procedure: CYSTOSCOPY WITH INSERTION OF UROLIFT;  Surgeon: Billey Co, MD;  Location: [facility] ORS;  Service: Urology;  Laterality: N/A;   ESOPHAGOGASTRODUODENOSCOPY (EGD) WITH PROPOFOL N/A 08/23/2016   Procedure: ESOPHAGOGASTRODUODENOSCOPY (EGD) WITH PROPOFOL;  Surgeon: Lollie Sails, MD;  Location: [facility] ENDOSCOPY;  Service: Endoscopy;  Laterality: N/A;   ESOPHAGOGASTRODUODENOSCOPY (EGD) WITH PROPOFOL N/A 04/29/2020   Procedure: ESOPHAGOGASTRODUODENOSCOPY (EGD) WITH PROPOFOL;  Surgeon: Lesly Rubenstein, MD;  Location: [facility] ENDOSCOPY;  Service: Endoscopy;  Laterality: N/A;   ESOPHAGOGASTRODUODENOSCOPY ENDOSCOPY  02/05/2008   EYE SURGERY     eye trauma  1970   piece of metal to eye removed in clinic   FRACTURE SURGERY Right    wrist    FRACTURE  SURGERY Right    knee; partial meniscectomy   KNEE ARTHROPLASTY Right 10/05/2020   Procedure: COMPUTER ASSISTED TOTAL KNEE ARTHROPLASTY;  Surgeon: Dereck Leep, MD;  Location: [facility] ORS;  Service: Orthopedics;  Laterality: Right;   right wrist surgery     SEPTOPLASTY     TONSILLECTOMY  02/05/2008    Short Social History:  Social History   Tobacco Use   Smoking status: Former    Packs/day: 1.00    Years: 20.00    Pack years: 20.00    Types: Cigarettes    Start date: 1976    Quit date: 09/23/2006    Years since quitting: 14.3   Smokeless tobacco: Never  Substance Use Topics   Alcohol use: No    No Known  Allergies  Current Outpatient Medications  Medication Sig Dispense Refill   acetaminophen (TYLENOL) 500 MG tablet Take 1,000 mg by mouth every 6 (six) hours as needed for moderate pain.     albuterol (VENTOLIN HFA) 108 (90 Base) MCG/ACT inhaler Inhale 2 puffs into the lungs every 6 (six) hours as needed for wheezing or shortness of breath. 24 g 3   amphetamine-dextroamphetamine (ADDERALL XR) 25 MG 24 hr capsule Take 25 mg by mouth daily as needed (focus).     aspirin EC 81 MG tablet Take 81 mg by mouth daily. Swallow whole.     b complex vitamins capsule Take 1 capsule by mouth daily.     baclofen (LIORESAL) 20 MG tablet Take 20 mg by mouth 3 (three) times daily.     budesonide-formoterol (SYMBICORT) 160-4.5 MCG/ACT inhaler Inhale 2 puffs into the lungs in the morning and at bedtime. (Patient taking differently: Inhale 1 puff into the lungs 2 (two) times daily.) 3 each 3   buPROPion (WELLBUTRIN XL) 150 MG 24 hr tablet Take 150 mg by mouth daily.     cholecalciferol (VITAMIN D) 1000 units tablet Take 3,000 Units by mouth daily.     diphenhydrAMINE (BENADRYL) 25 MG tablet Take 25 mg by mouth at bedtime.     Fingolimod HCl 0.5 MG CAPS Take 0.5 mg by mouth every morning.     gabapentin (NEURONTIN) 300 MG capsule Take 300 mg by mouth 3 (three) times daily.     mirabegron ER (MYRBETRIQ) 50 MG TB24 tablet Take 1 tablet (50 mg total) by mouth daily. 90 tablet 3   montelukast (SINGULAIR) 10 MG tablet TAKE 1 TABLET AT BEDTIME 90 tablet 0   Multiple Vitamins-Minerals (MULTIVITAMIN WITH MINERALS) tablet Take 1 tablet by mouth daily.     omeprazole (PRILOSEC) 20 MG capsule Take 20 mg by mouth daily.     rosuvastatin (CRESTOR) 20 MG tablet Take 1 tablet (20 mg total) by mouth daily. (Patient taking differently: Take 20 mg by mouth at bedtime.) 90 tablet 3   Spacer/Aero-Holding Chambers (AEROCHAMBER MV) inhaler Use as instructed 1 each 0   tamsulosin (FLOMAX) 0.4 MG CAPS capsule Take 1 capsule (0.4 mg total)  by mouth daily. 90 capsule 2   traZODone (DESYREL) 100 MG tablet Take 100 mg by mouth at bedtime.     sulfacetamide (BLEPH-10) 10 % ophthalmic solution Place 1-2 drops into the left eye every 4 (four) hours. X 7 days (Patient not taking: No sig reported) 15 mL 0   No current facility-administered medications for this visit.    Review of Systems  Constitutional:  Constitutional negative. HENT: HENT negative.  Eyes: Eyes negative.  Respiratory: Positive for cough and wheezing.  Cardiovascular: Cardiovascular  negative.  GI: Gastrointestinal negative.  Musculoskeletal: Positive for joint pain.       Recent right knee replacement Neurological: Neurological negative. Hematologic: Positive for bruises/bleeds easily.  Psychiatric: Psychiatric negative.       Objective:  Objective   Vitals:   01/25/21 0927  BP: 122/76  Pulse: 63  Resp: 20  Temp: 98.3 F (36.8 C)  SpO2: 95%  Weight: 205 lb (93 kg)  Height: 6' (1.829 m)   Body mass index is 27.8 kg/m.  Physical Exam HENT:     Head: Normocephalic.     Nose:     Comments: Wearing a mask Eyes:     Pupils: Pupils are equal, round, and reactive to light.  Cardiovascular:     Pulses:          Radial pulses are 2+ on the right side and 2+ on the left side.       Popliteal pulses are 2+ on the right side and 2+ on the left side.       Dorsalis pedis pulses are 2+ on the right side and 2+ on the left side.       Posterior tibial pulses are 2+ on the right side and 2+ on the left side.     Comments: Popliteal arteries are not enlarged by physical exam Pulmonary:     Effort: Pulmonary effort is normal.  Abdominal:     General: Abdomen is flat.     Palpations: Abdomen is soft. There is no mass.  Musculoskeletal:        General: Normal range of motion.     Right lower leg: No edema.     Left lower leg: No edema.  Skin:    General: Skin is warm and dry.     Capillary Refill: Capillary refill takes less than 2 seconds.   Neurological:     General: No focal deficit present.     Mental Status: He is alert.  Psychiatric:        Mood and Affect: Mood normal.        Behavior: Behavior normal.        Thought Content: Thought content normal.        Judgment: Judgment normal.    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  We discussed his options being continued watchful waiting given that we do not know of any inciting incident or previous size.  I discussed with him that he is meets criteria greater than 2 to 2-1/2 cm of saccular aneurysm repair.  I discussed with him the options being open versus endovascular and even amongst the endovascular options he has a tube graft option that would require 1 femoral approach or standard endovascular aneurysm repair option with 2 femoral approaches.  I discussed the risk to his heart and we will get cardiology to evaluate him.  I have also discussed the risk of contrast to his kidneys, risk of general anesthesia up to including death, risk of blood loss requiring transfusion and specifically risk of injury to the common femoral or common or external iliac arteries which would require more extensive repair.  We also discussed the possibility of conversion to open surgery to be low however not 0%.  He demonstrates good understanding of the risks we will get him scheduled in the near  future after cardiology clearance.  He can continue aspirin throughout the perioperative period.     Waynetta Sandy MD [facility]

## 2021-02-21 ENCOUNTER — Telehealth: Payer: Self-pay | Admitting: Primary Care

## 2021-02-21 NOTE — Telephone Encounter (Signed)
I would say he should see Icard, he has some openings early December. He can get a CXR prior

## 2021-02-21 NOTE — Telephone Encounter (Signed)
I made a follow up with Dr. Valeta Harms and patient is aware. Nothing further needed.

## 2021-02-21 NOTE — Telephone Encounter (Signed)
Please advise if ok to come in for surgical clearance or if he needs to see primary pulmonary physician. Thanks

## 2021-02-21 NOTE — Telephone Encounter (Signed)
I have called the patient and made a follow up with the provider to talk with before his procedure. Patient is aware of his appointment and did not have any questions. Nothing further needed.

## 2021-02-22 NOTE — Progress Notes (Signed)
Office Visit    Patient Name: Allen Tapia Date of Encounter: 02/23/2021  PCP:  Derinda Late, MD   [place]  Cardiologist:  Buford Dresser, MD  Advanced Practice Provider:  No care team member to display Electrophysiologist:  None    Chief Complaint    Allen Tapia is a 65 y.o. male with a hx of multiple sclerosis, squamous cell skin cancer, hyperlipidemia, tobacco use, and coronary artery disease presents today for cardiac surgical clearance for abdominal aortic endovascular stent.   Past Medical History    Past Medical History:  Diagnosis Date   Arthritis    Asthma    Atypical chest pain    Barrett's esophagus    Bilateral cataracts    Chicken pox    Chronic constipation    Coronary artery disease 12/20/2010   Depression    Foreign body (FB) in soft tissue    Former tobacco use    GERD (gastroesophageal reflux disease)    History of 2019 novel coronavirus disease (COVID-19) 04/07/2019   Hyperlipidemia    Hyperplastic colon polyp    Ischemic heart disease 12/20/2010   MS (multiple sclerosis) ([place])    Shingles    Shingles    Tubular adenoma of colon 08/23/2016   Past Surgical History:  Procedure Laterality Date   [place]   ARTHROPLASTY Right 01/02/2011   partial meniscectomy    CARDIAC CATHETERIZATION     CATARACT EXTRACTION, BILATERAL  03/12/2019   COLONOSCOPY  02/05/2008   COLONOSCOPY WITH PROPOFOL N/A 08/23/2016   Procedure: COLONOSCOPY WITH PROPOFOL;  Surgeon: Lollie Sails, MD;  Location: [facility] ENDOSCOPY;  Service: Endoscopy;  Laterality: N/A;   COLONOSCOPY WITH PROPOFOL N/A 04/29/2020   Procedure: COLONOSCOPY WITH PROPOFOL;  Surgeon: Lesly Rubenstein, MD;  Location: [facility] ENDOSCOPY;  Service: Endoscopy;  Laterality: N/A;   CYSTOSCOPY WITH INSERTION OF UROLIFT N/A 05/20/2020   Procedure: CYSTOSCOPY WITH INSERTION OF UROLIFT;  Surgeon: Billey Co, MD;  Location: [facility]  ORS;  Service: Urology;  Laterality: N/A;   ESOPHAGOGASTRODUODENOSCOPY (EGD) WITH PROPOFOL N/A 08/23/2016   Procedure: ESOPHAGOGASTRODUODENOSCOPY (EGD) WITH PROPOFOL;  Surgeon: Lollie Sails, MD;  Location: [facility] ENDOSCOPY;  Service: Endoscopy;  Laterality: N/A;   ESOPHAGOGASTRODUODENOSCOPY (EGD) WITH PROPOFOL N/A 04/29/2020   Procedure: ESOPHAGOGASTRODUODENOSCOPY (EGD) WITH PROPOFOL;  Surgeon: Lesly Rubenstein, MD;  Location: [facility] ENDOSCOPY;  Service: Endoscopy;  Laterality: N/A;   ESOPHAGOGASTRODUODENOSCOPY ENDOSCOPY  02/05/2008   EYE SURGERY     eye trauma  1970   piece of metal to eye removed in clinic   FRACTURE SURGERY Right    wrist    FRACTURE SURGERY Right    knee; partial meniscectomy   KNEE ARTHROPLASTY Right 10/05/2020   Procedure: COMPUTER ASSISTED TOTAL KNEE ARTHROPLASTY;  Surgeon: Dereck Leep, MD;  Location: [facility] ORS;  Service: Orthopedics;  Laterality: Right;   right wrist surgery     SEPTOPLASTY     TONSILLECTOMY  02/05/2008    Allergies  No Known Allergies  History of Present Illness    Allen Tapia is a 65 y.o. male with a hx of hx of multiple sclerosis, squamous cell skin cancer, hyperlipidemia, tobacco use, and coronary artery disease presents today for cardiac surgical clearance for right computer-assisted total knee arthroplasty last seen February 2022 by Dr. Harrell Gave.  He originally met Dr. Harrell Gave February 2021 as a new consult for evaluation of coronary artery disease.  2009 he  had a cardiac catheterization at [facility] which showed obstructive LAD disease only, not intervened on as there was no matching ischemia on stress testing, EF 65%.  Other vessels were nonobstructive.  He has had chronic musculoskeletal chest pain.  He usually occurs with very specific movements and he knows will trigger it.  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  He has been retired for 7 years.  During his last visit in  February 2022 he is overall doing well.  Recently had prostate surgery.  He had his third round of COVID despite 3 immunizations.  He remained active and was renovating a house.  He continued to have mild chest twinges with standing/positional changes but not exertional.  This is likely musculoskeletal.  He denies shortness of breath at rest or with exertion, PND, orthopnea, lower extremity edema or unexpected weight gain.  No syncope or palpitations.  Today, he  had been doing some yoga and he really enjoys that. He is able to push a Conservation officer, nature. Never had exertional chest pain. He had done a lot of yard work lately. 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 He is going to see Dr. Valeta Harms for pulmonary clearance.He is most concerned about this appointment due to his decreased lung capacity, He has had COVID 19 three times and the first time he had a lot of complications. He has not had any chest pain since the summer and he has never had exertional chest pain. He believes the chest pain he felt while cutting 12 chickens was musculoskeletal. He has not had any shortness of breath but does feel like its more difficult to take a deep breath.   Reports no shortness of breath nor dyspnea on exertion. Reports no chest pain, pressure, or tightness. No edema, orthopnea, PND. Reports no palpitations.     EKGs/Labs/Other Studies Reviewed:   The following studies were reviewed today: Cath 06/2007 ([facility]) CORONARY ARTERIOGRAMS   Dominance: right   SA node origin: right   AV node origin: right   Branch                    Stenosis  Lesion Type  TIMI Flow   ------------------------  --------  -----------  ---------   Right Coronary Artery     Prox RCA                40%       Discrete     Prox RCA                40%       Discrete     Dist RCA                40%       Discrete     RPL1                    20%       Discrete     RPL2  (Small)     RPL3   (Small)   Left Main     L Main                  30%       Discrete   Left Circumflex     Mid LCX  20%       Discrete     Mid LCX                 40%       Discrete     OM3  (Large)            20%       Discrete     OM3  (Large)            20%       Discrete   Left Anterior Descending     Prox LAD                20%       Discrete     Mid LAD                 40%       Diffuse     *Mid LAD                70%       Discrete     Mid LAD                 40%       Discrete     Dist LAD                20%       Discrete     D1                      40%       Discrete     D2                      40%       Discrete   MPI ([facility]) 07/08/2007 Left Ventricle Analysis :                            Rest           Stress         Conclusions      - High Anterolateral  Normal         Normal         Normal      - Low Anterolateral   Normal         Normal         Normal      - High Posterolateral Normal         Normal         Normal      - Low Posterolateral  Normal         Normal         Normal      - High Anterior       Normal         Normal         Normal      - Low Anterior        Normal         Normal         Normal      - Anteroapical        Normal         Normal         Normal      - Posterobasal        Normal  Dec mod        Ischemia      - Inferior            Normal         Dec mod        Ischemia      - Inferoapical        Normal         Normal         Normal      - Inferoseptal        Normal         Normal         Normal      - Anteroseptal        Normal         Normal         Normal    EKG:  EKG is  ordered today.  The ekg ordered today demonstrates NSR rate, 63 bpm  Recent Labs: 09/23/2020: ALT 33; BUN 16; Hemoglobin 13.7; Platelets 206; Potassium 3.8; Sodium 136 01/05/2021: Creatinine, Ser 0.70  Recent Lipid Panel No results found for: CHOL, TRIG, HDL, CHOLHDL, VLDL, LDLCALC, LDLDIRECT   Home Medications   Current Meds  Medication Sig   acetaminophen (TYLENOL)  500 MG tablet Take 1,000 mg by mouth every 6 (six) hours as needed for moderate pain.   albuterol (VENTOLIN HFA) 108 (90 Base) MCG/ACT inhaler Inhale 2 puffs into the lungs every 6 (six) hours as needed for wheezing or shortness of breath.   amphetamine-dextroamphetamine (ADDERALL XR) 25 MG 24 hr capsule Take 25 mg by mouth daily as needed (focus).   aspirin EC 81 MG tablet Take 81 mg by mouth daily. Swallow whole.   b complex vitamins capsule Take 1 capsule by mouth at bedtime.   baclofen (LIORESAL) 20 MG tablet Take 20 mg by mouth 3 (three) times daily.   budesonide-formoterol (SYMBICORT) 160-4.5 MCG/ACT inhaler Inhale 2 puffs into the lungs in the morning and at bedtime. (Patient taking differently: Inhale 1-2 puffs into the lungs 2 (two) times daily.)   buPROPion (WELLBUTRIN XL) 150 MG 24 hr tablet Take 150 mg by mouth daily.   gabapentin (NEURONTIN) 300 MG capsule Take 300 mg by mouth 3 (three) times daily.   mirabegron ER (MYRBETRIQ) 50 MG TB24 tablet Take 1 tablet (50 mg total) by mouth daily. (Patient taking differently: Take 50 mg by mouth daily in the afternoon.)   montelukast (SINGULAIR) 10 MG tablet TAKE 1 TABLET AT BEDTIME   Multiple Vitamins-Minerals (MULTIVITAMIN WITH MINERALS) tablet Take 1 tablet by mouth daily.   omeprazole (PRILOSEC) 40 MG capsule Take 40 mg by mouth every evening.   pantoprazole (PROTONIX) 20 MG tablet Take 20 mg by mouth daily.   rosuvastatin (CRESTOR) 20 MG tablet Take 1 tablet (20 mg total) by mouth daily. (Patient taking differently: Take 20 mg by mouth at bedtime.)   sodium chloride (OCEAN) 0.65 % SOLN nasal spray Place 1 spray into both nostrils 4 (four) times daily as needed for congestion.   Spacer/Aero-Holding Chambers (AEROCHAMBER MV) inhaler Use as instructed   tamsulosin (FLOMAX) 0.4 MG CAPS capsule Take 1 capsule (0.4 mg total) by mouth daily.   traZODone (DESYREL) 100 MG tablet Take 100 mg by mouth at bedtime.   VITAMIN D PO Take 1 capsule by  mouth daily.     Review of Systems      All other systems reviewed and are otherwise negative except as noted above.  Physical Exam    VS:  BP 114/70   Pulse 63   Ht 6' (1.829 m)   Wt 208 lb (94.3 kg)   BMI 28.21 kg/m  , BMI Body mass index is 28.21 kg/m.  Wt Readings from Last 3 Encounters:  02/23/21 208 lb (94.3 kg)  01/25/21 205 lb (93 kg)  01/06/21 209 lb (94.8 kg)     GEN: Well nourished, well developed, in no acute distress. HEENT: normal. Neck: Supple, no JVD, carotid bruits, or masses. Cardiac: RRR, no murmurs, rubs, or gallops. No clubbing, cyanosis, edema.  Radials/PT 2+ and equal bilaterally.  Respiratory:  Respirations regular and unlabored, clear to auscultation bilaterally. GI: Soft, nontender, nondistended. MS: No deformity or atrophy. Skin: Warm and dry, no rash. Neuro:  Strength and sensation are intact. Psych: Normal affect.  Assessment & Plan    CAD without angina -Family history of heart disease -No typical angina -Continue GDMT: Aspirin 81 mg, Crestor -Staying off metoprolol, HR 63 today -Continue lipid management  Hypercholesterolemia -Tolerating Crestor 20 mg daily -Last lipid panel done [facility] 02/23/2020 showed total cholesterol, 130 triglycerides 53, HDL 51, LDL 68 -Doing well with diet and exercise -PCP will order next month and we will follow-up   Family history of heart disease -father, passed away at 44 died of cancer, at age 78 CABG x 40.  -Brother HTN -Mother died at 46, cardiomyopathy from virus  Disease prevention counseling -continue low-sodium diet -continue exercise as able, he does not do much walking but is able to do strenuous yard work   5. Tobacco Abuse -quit 20 years  Disposition: Follow up in 1 year(s) with Buford Dresser, MD or APP.     Mr. Don perioperative risk of a major cardiac event is 6.6% according to the Revised Cardiac Risk Index (RCRI).  Therefore, he is at high risk for perioperative  complications.   His functional capacity is good at 6.36 METs according to the [facility] Activity Status Index (DASI). Recommendations: According to ACC/AHA guidelines, no further cardiovascular testing needed.  The patient may proceed to surgery at acceptable risk.   Antiplatelet and/or Anticoagulation Recommendations: The patient should remain on Aspirin without interruption.     Signed, Elgie Collard, PA-C 02/23/2021, 9:50 AM [place]

## 2021-02-23 ENCOUNTER — Ambulatory Visit (INDEPENDENT_AMBULATORY_CARE_PROVIDER_SITE_OTHER): Payer: Medicare Other | Admitting: Physician Assistant

## 2021-02-23 ENCOUNTER — Encounter (HOSPITAL_BASED_OUTPATIENT_CLINIC_OR_DEPARTMENT_OTHER): Payer: Self-pay | Admitting: Family

## 2021-02-23 ENCOUNTER — Other Ambulatory Visit: Payer: Self-pay | Admitting: Primary Care

## 2021-02-23 ENCOUNTER — Other Ambulatory Visit: Payer: Self-pay

## 2021-02-23 VITALS — BP 114/70 | HR 63 | Ht 72.0 in | Wt 208.0 lb

## 2021-02-23 DIAGNOSIS — E785 Hyperlipidemia, unspecified: Secondary | ICD-10-CM | POA: Diagnosis not present

## 2021-02-23 DIAGNOSIS — I2510 Atherosclerotic heart disease of native coronary artery without angina pectoris: Secondary | ICD-10-CM | POA: Diagnosis not present

## 2021-02-23 DIAGNOSIS — Z720 Tobacco use: Secondary | ICD-10-CM

## 2021-02-23 DIAGNOSIS — Z8249 Family history of ischemic heart disease and other diseases of the circulatory system: Secondary | ICD-10-CM

## 2021-02-23 NOTE — Patient Instructions (Signed)
Medication Instructions:  Your Physician recommend you continue on your current medication as directed.    *If you need a refill on your cardiac medications before your next appointment, please call your pharmacy*   Lab Work: None ordered today   Testing/Procedures: You are clear for your procedure   Follow-Up: At [facility], you and your health needs are our priority.  As part of our continuing mission to provide you with exceptional heart care, we have created designated Provider Care Teams.  These Care Teams include your primary Cardiologist (physician) and Advanced Practice Providers (APPs -  Physician Assistants and Nurse Practitioners) who all work together to provide you with the care you need, when you need it.  We recommend signing up for the patient portal called "MyChart".  Sign up information is provided on this After Visit Summary.  MyChart is used to connect with patients for Virtual Visits (Telemedicine).  Patients are able to view lab/test results, encounter notes, upcoming appointments, etc.  Non-urgent messages can be sent to your provider as well.   To learn more about what you can do with MyChart, go to [email or web address].    Your next appointment:   1 year(s)  The format for your next appointment:   In Person  Provider:   Buford Dresser, MD, Laurann Montana, NP, or Coletta Memos, NP    Other Instructions None today

## 2021-02-24 ENCOUNTER — Ambulatory Visit (INDEPENDENT_AMBULATORY_CARE_PROVIDER_SITE_OTHER): Payer: Medicare Other | Admitting: Pulmonary Disease

## 2021-02-24 ENCOUNTER — Encounter: Payer: Self-pay | Admitting: Pulmonary Disease

## 2021-02-24 VITALS — BP 126/78 | HR 65 | Temp 97.9°F | Ht 74.0 in | Wt 208.2 lb

## 2021-02-24 DIAGNOSIS — J4520 Mild intermittent asthma, uncomplicated: Secondary | ICD-10-CM

## 2021-02-24 DIAGNOSIS — Z9109 Other allergy status, other than to drugs and biological substances: Secondary | ICD-10-CM | POA: Diagnosis not present

## 2021-02-24 DIAGNOSIS — R062 Wheezing: Secondary | ICD-10-CM | POA: Diagnosis not present

## 2021-02-24 MED ORDER — BUDESONIDE-FORMOTEROL FUMARATE 160-4.5 MCG/ACT IN AERO: 2.0000 | INHALATION_SPRAY | Freq: Two times a day (BID) | RESPIRATORY_TRACT | 3 refills | Status: DC

## 2021-02-24 NOTE — Progress Notes (Signed)
Synopsis: Referred in March 2020 for cough by Derinda Late, MD  Subjective:   PATIENT ID: Allen Tapia GENDER: male DOB: November 23, 1955, MRN: [id number]  Chief Complaint  Patient presents with   Follow-up    Patient says his head feels congested and his chest    Patient complains of cough for the past several weeks. He has episodic events since then. He took a group of teenagers to the beach and got sick for a few days with URI symptoms the cough has been persistent. Former smoker, quit 20 years ago, smoked for 20 years, for about 1 ppd. He had pulmonary function tests in 2009. 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 He notices that his cough is worse when its cold. He has had allergies as a child. He had significant allergies as a kid. Never hospitalized as a child for respiratory symptoms. He does have a dry mouth. He does take trazadone and benadryl at bedtime. No eczema. He was dx with MS, dx in 2012. His gait has been the most difficult. He a swallow study 5 years ago. He has had episodes of cough/choking recently.   OV 12/30/2018: Doing well since last office visit.  He used Symbicort for a short period of time.  His symptoms abated.  He did well throughout the summer with no issues.  This fall noticed with the change in weather developed chest tightness with wheezing again.  Had an exacerbation a few weeks ago.  He was called to set up his PFT appointment at this time noticed that he was still having much difficulty breathing.  He did restart his Symbicort samples that he was given in the past.  After about a week of Symbicort his symptoms significantly improved no longer had dyspnea on exertion shortness of breath chest tightness or wheezing.  At this time he is doing much better understands that he needs to be on medication daily.  He has restarted it does need refills of this medication.  OV 03/26/2019: Patient here today for follow-up from her's symptoms of cough and  shortness of breath.  He was restarted on Symbicort.  His symptoms have completely dissipated.  No longer having chest tightness no longer having wheezing.  His cough is also gone.  He is doing much better.  Using his inhalers regularly with spacer.  He has occasionally noticed some hoarseness of voice.  Overall respiratory symptoms are better.  Denies hemoptysis shortness of breath chest pain chest tightness or wheezing.  OV 02/24/2021: Recently found an incidentaly saccular aortic aneurysm with a plans for surgery. Here today to see [place] for eval prior to surgery. He has has allergy symptoms and has had cough, dry, barking at time, early morning with productive cough. He has drainage issues when he wakes up in the morning.  Overall he does feel like he is doing better over the past few weeks his flareup occurred during the fall after a bout of working outside leads.     Past Medical History:  Diagnosis Date   Arthritis    Asthma    Atypical chest pain    Barrett's esophagus    Bilateral cataracts    Chicken pox    Chronic constipation    Coronary artery disease 12/20/2010   Depression    Foreign body (FB) in soft tissue    Former tobacco use    GERD (gastroesophageal reflux disease)    History of 2019 novel coronavirus disease (COVID-19) 04/07/2019   Hyperlipidemia  Hyperplastic colon polyp    Ischemic heart disease 12/20/2010   MS (multiple sclerosis) (HCC)    Shingles    Shingles    Tubular adenoma of colon 08/23/2016     Family History  Problem Relation Age of Onset   Heart disease Mother    Glaucoma Mother    Irregular heart beat Mother    Cardiomyopathy Mother    Colon polyps Mother    Cataracts Mother    Hypertension Father    Heart disease Father    Cancer Father    Esophageal cancer Cousin      Past Surgical History:  Procedure Laterality Date   ADENOIDECTOMY  1960   APPENDECTOMY  1979   ARTHROPLASTY Right 01/02/2011   partial meniscectomy    CARDIAC  CATHETERIZATION     CATARACT EXTRACTION, BILATERAL  03/12/2019   COLONOSCOPY  02/05/2008   COLONOSCOPY WITH PROPOFOL N/A 08/23/2016   Procedure: COLONOSCOPY WITH PROPOFOL;  Surgeon: Lollie Sails, MD;  Location: [facility] ENDOSCOPY;  Service: Endoscopy;  Laterality: N/A;   COLONOSCOPY WITH PROPOFOL N/A 04/29/2020   Procedure: COLONOSCOPY WITH PROPOFOL;  Surgeon: Lesly Rubenstein, MD;  Location: [facility] ENDOSCOPY;  Service: Endoscopy;  Laterality: N/A;   CYSTOSCOPY WITH INSERTION OF UROLIFT N/A 05/20/2020   Procedure: CYSTOSCOPY WITH INSERTION OF UROLIFT;  Surgeon: Billey Co, MD;  Location: [facility] ORS;  Service: Urology;  Laterality: N/A;   ESOPHAGOGASTRODUODENOSCOPY (EGD) WITH PROPOFOL N/A 08/23/2016   Procedure: ESOPHAGOGASTRODUODENOSCOPY (EGD) WITH PROPOFOL;  Surgeon: Lollie Sails, MD;  Location: [facility] ENDOSCOPY;  Service: Endoscopy;  Laterality: N/A;   ESOPHAGOGASTRODUODENOSCOPY (EGD) WITH PROPOFOL N/A 04/29/2020   Procedure: ESOPHAGOGASTRODUODENOSCOPY (EGD) WITH PROPOFOL;  Surgeon: Lesly Rubenstein, MD;  Location: [facility] ENDOSCOPY;  Service: Endoscopy;  Laterality: N/A;   ESOPHAGOGASTRODUODENOSCOPY ENDOSCOPY  02/05/2008   EYE SURGERY     eye trauma  1970   piece of metal to eye removed in clinic   FRACTURE SURGERY Right    wrist    FRACTURE SURGERY Right    knee; partial meniscectomy   KNEE ARTHROPLASTY Right 10/05/2020   Procedure: COMPUTER ASSISTED TOTAL KNEE ARTHROPLASTY;  Surgeon: Dereck Leep, MD;  Location: [facility] ORS;  Service: Orthopedics;  Laterality: Right;   right wrist surgery     SEPTOPLASTY     TONSILLECTOMY  02/05/2008    Social History   Socioeconomic History   Marital status: Married    Spouse name: Not on file   Number of children: Not on file   Years of education: Not on file   Highest education level: Not on file  Occupational History   Not on file  Tobacco Use   Smoking status: Former    Packs/day: 1.00    Years: 20.00    Pack years: 20.00     Types: Cigarettes    Start date: 85    Quit date: 09/23/2006    Years since quitting: 14.4   Smokeless tobacco: Never  Vaping Use   Vaping Use: Never used  Substance and Sexual Activity   Alcohol use: No   Drug use: No   Sexual activity: Yes    Birth control/protection: None  Other Topics Concern   Not on file  Social History Narrative   Not on file   Social Determinants of Health   Financial Resource Strain: Not on file  Food Insecurity: Not on file  Transportation Needs: Not on file  Physical Activity: Not on file  Stress: Not on file  Social Connections:  Not on file  Intimate Partner Violence: Not on file     No Known Allergies   Outpatient Medications Prior to Visit  Medication Sig Dispense Refill   acetaminophen (TYLENOL) 500 MG tablet Take 1,000 mg by mouth every 6 (six) hours as needed for moderate pain.     albuterol (VENTOLIN HFA) 108 (90 Base) MCG/ACT inhaler Inhale 2 puffs into the lungs every 6 (six) hours as needed for wheezing or shortness of breath. 24 g 3   amphetamine-dextroamphetamine (ADDERALL XR) 25 MG 24 hr capsule Take 25 mg by mouth daily as needed (focus).     aspirin EC 81 MG tablet Take 81 mg by mouth daily. Swallow whole.     b complex vitamins capsule Take 1 capsule by mouth at bedtime.     baclofen (LIORESAL) 20 MG tablet Take 20 mg by mouth 3 (three) times daily.     budesonide-formoterol (SYMBICORT) 160-4.5 MCG/ACT inhaler Inhale 2 puffs into the lungs in the morning and at bedtime. (Patient taking differently: Inhale 1-2 puffs into the lungs 2 (two) times daily.) 3 each 3   buPROPion (WELLBUTRIN XL) 150 MG 24 hr tablet Take 150 mg by mouth daily.     gabapentin (NEURONTIN) 300 MG capsule Take 300 mg by mouth 3 (three) times daily.     mirabegron ER (MYRBETRIQ) 50 MG TB24 tablet Take 1 tablet (50 mg total) by mouth daily. (Patient taking differently: Take 50 mg by mouth daily in the afternoon.) 90 tablet 3   montelukast (SINGULAIR) 10 MG  tablet TAKE 1 TABLET AT BEDTIME 90 tablet 0   Multiple Vitamins-Minerals (MULTIVITAMIN WITH MINERALS) tablet Take 1 tablet by mouth daily.     omeprazole (PRILOSEC) 40 MG capsule Take 40 mg by mouth every evening.     pantoprazole (PROTONIX) 20 MG tablet Take 20 mg by mouth daily.     Polyvinyl Alcohol (LIQUID TEARS OP) Place 1 drop into both eyes daily as needed (dry eyes).     rosuvastatin (CRESTOR) 20 MG tablet Take 1 tablet (20 mg total) by mouth daily. (Patient taking differently: Take 20 mg by mouth at bedtime.) 90 tablet 3   sodium chloride (OCEAN) 0.65 % SOLN nasal spray Place 1 spray into both nostrils 4 (four) times daily as needed for congestion.     Spacer/Aero-Holding Chambers (AEROCHAMBER MV) inhaler Use as instructed 1 each 0   tamsulosin (FLOMAX) 0.4 MG CAPS capsule Take 1 capsule (0.4 mg total) by mouth daily. 90 capsule 2   traZODone (DESYREL) 100 MG tablet Take 100 mg by mouth at bedtime.     VITAMIN D PO Take 1 capsule by mouth daily.     No facility-administered medications prior to visit.    Review of Systems  Constitutional:  Negative for chills, fever, malaise/fatigue and weight loss.  HENT:  Negative for hearing loss, sore throat and tinnitus.   Eyes:  Negative for blurred vision and double vision.  Respiratory:  Positive for cough and shortness of breath. Negative for hemoptysis, sputum production, wheezing and stridor.   Cardiovascular:  Negative for chest pain, palpitations, orthopnea, leg swelling and PND.  Gastrointestinal:  Negative for abdominal pain, constipation, diarrhea, heartburn, nausea and vomiting.  Genitourinary:  Negative for dysuria, hematuria and urgency.  Musculoskeletal:  Negative for joint pain and myalgias.  Skin:  Negative for itching and rash.  Neurological:  Negative for dizziness, tingling, weakness and headaches.  Endo/Heme/Allergies:  Negative for environmental allergies. Does not bruise/bleed easily.  Psychiatric/Behavioral:  Negative  for depression. The patient is not nervous/anxious and does not have insomnia.   All other systems reviewed and are negative.   Objective:  Physical Exam Vitals reviewed.  Constitutional:      General: He is not in acute distress.    Appearance: He is well-developed.  HENT:     Head: Normocephalic and atraumatic.  Eyes:     General: No scleral icterus.    Conjunctiva/sclera: Conjunctivae normal.     Pupils: Pupils are equal, round, and reactive to light.  Neck:     Vascular: No JVD.     Trachea: No tracheal deviation.  Cardiovascular:     Rate and Rhythm: Normal rate and regular rhythm.     Heart sounds: Normal heart sounds. No murmur heard. Pulmonary:     Effort: Pulmonary effort is normal. No tachypnea, accessory muscle usage or respiratory distress.     Breath sounds: No stridor. No wheezing, rhonchi or rales.  Abdominal:     General: There is no distension.     Palpations: Abdomen is soft.     Tenderness: There is no abdominal tenderness.  Musculoskeletal:        General: No tenderness.     Cervical back: Neck supple.  Lymphadenopathy:     Cervical: No cervical adenopathy.  Skin:    General: Skin is warm and dry.     Capillary Refill: Capillary refill takes less than 2 seconds.     Findings: No rash.  Neurological:     Mental Status: He is alert and oriented to person, place, and time.  Psychiatric:        Behavior: Behavior normal.     Vitals:   02/24/21 1114  BP: 126/78  Pulse: 65  Temp: 97.9 F (36.6 C)  TempSrc: Oral  SpO2: 99%  Weight: 208 lb 3.2 oz (94.4 kg)  Height: 6\' 2"  (1.88 m)   99% on RA BMI Readings from Last 3 Encounters:  02/24/21 26.73 kg/m  02/23/21 28.21 kg/m  01/25/21 27.80 kg/m   Wt Readings from Last 3 Encounters:  02/24/21 208 lb 3.2 oz (94.4 kg)  02/23/21 208 lb (94.3 kg)  01/25/21 205 lb (93 kg)     CBC    Component Value Date/Time   WBC 4.8 09/23/2020 1423   RBC 4.32 09/23/2020 1423   HGB 13.7 09/23/2020 1423    HCT 41.1 09/23/2020 1423   PLT 206 09/23/2020 1423   MCV 95.1 09/23/2020 1423   MCH 31.7 09/23/2020 1423   MCHC 33.3 09/23/2020 1423   RDW 13.5 09/23/2020 1423   LYMPHSABS 0.1 (L) 08/23/2016 0848   MONOABS 0.6 08/23/2016 0848   EOSABS 0.2 08/23/2016 0848   BASOSABS 0.0 08/23/2016 0848    Chest Imaging: No recent chest imaging   Pulmonary Functions Testing Results: No flowsheet data found.  FeNO: None   Pathology: None   Echocardiogram: None   Heart Catheterization: None     Assessment & Plan:   Mild intermittent asthma without complication  Wheezing  Environmental allergies   Discussion:  This is a 65 year old gentleman, former smoker, environmental allergies, seasonal allergies, multiple sclerosis.  Patient with mild intermittent asthma symptoms doing well on Symbicort daily plus as needed albuterol  He is planned for a vascular surgery next week.  Plan: I see no issues from a respiratory standpoint regarding undergoing surgery. Continue Symbicort inhaler 1 puff, twice daily with spacer Continue albuterol for shortness of breath and wheezing as needed. Would continue daily  antihistamine during peak allergy season Continue daily Singulair  Return to clinic in 1 year or as needed.    Current Outpatient Medications:    acetaminophen (TYLENOL) 500 MG tablet, Take 1,000 mg by mouth every 6 (six) hours as needed for moderate pain., Disp: , Rfl:    albuterol (VENTOLIN HFA) 108 (90 Base) MCG/ACT inhaler, Inhale 2 puffs into the lungs every 6 (six) hours as needed for wheezing or shortness of breath., Disp: 24 g, Rfl: 3   amphetamine-dextroamphetamine (ADDERALL XR) 25 MG 24 hr capsule, Take 25 mg by mouth daily as needed (focus)., Disp: , Rfl:    aspirin EC 81 MG tablet, Take 81 mg by mouth daily. Swallow whole., Disp: , Rfl:    b complex vitamins capsule, Take 1 capsule by mouth at bedtime., Disp: , Rfl:    baclofen (LIORESAL) 20 MG tablet, Take 20 mg by mouth 3  (three) times daily., Disp: , Rfl:    budesonide-formoterol (SYMBICORT) 160-4.5 MCG/ACT inhaler, Inhale 2 puffs into the lungs in the morning and at bedtime. (Patient taking differently: Inhale 1-2 puffs into the lungs 2 (two) times daily.), Disp: 3 each, Rfl: 3   buPROPion (WELLBUTRIN XL) 150 MG 24 hr tablet, Take 150 mg by mouth daily., Disp: , Rfl:    gabapentin (NEURONTIN) 300 MG capsule, Take 300 mg by mouth 3 (three) times daily., Disp: , Rfl:    mirabegron ER (MYRBETRIQ) 50 MG TB24 tablet, Take 1 tablet (50 mg total) by mouth daily. (Patient taking differently: Take 50 mg by mouth daily in the afternoon.), Disp: 90 tablet, Rfl: 3   montelukast (SINGULAIR) 10 MG tablet, TAKE 1 TABLET AT BEDTIME, Disp: 90 tablet, Rfl: 0   Multiple Vitamins-Minerals (MULTIVITAMIN WITH MINERALS) tablet, Take 1 tablet by mouth daily., Disp: , Rfl:    omeprazole (PRILOSEC) 40 MG capsule, Take 40 mg by mouth every evening., Disp: , Rfl:    pantoprazole (PROTONIX) 20 MG tablet, Take 20 mg by mouth daily., Disp: , Rfl:    Polyvinyl Alcohol (LIQUID TEARS OP), Place 1 drop into both eyes daily as needed (dry eyes)., Disp: , Rfl:    rosuvastatin (CRESTOR) 20 MG tablet, Take 1 tablet (20 mg total) by mouth daily. (Patient taking differently: Take 20 mg by mouth at bedtime.), Disp: 90 tablet, Rfl: 3   sodium chloride (OCEAN) 0.65 % SOLN nasal spray, Place 1 spray into both nostrils 4 (four) times daily as needed for congestion., Disp: , Rfl:    Spacer/Aero-Holding Chambers (AEROCHAMBER MV) inhaler, Use as instructed, Disp: 1 each, Rfl: 0   tamsulosin (FLOMAX) 0.4 MG CAPS capsule, Take 1 capsule (0.4 mg total) by mouth daily., Disp: 90 capsule, Rfl: 2   traZODone (DESYREL) 100 MG tablet, Take 100 mg by mouth at bedtime., Disp: , Rfl:    VITAMIN D PO, Take 1 capsule by mouth daily., Disp: , Rfl:    Garner Nash, DO [facility] 02/24/2021 11:26 AM

## 2021-02-24 NOTE — Patient Instructions (Addendum)
Thank you for visiting Dr. Valeta Harms at [facility]. Today we recommend the following:  Meds ordered this encounter  Medications   budesonide-formoterol (SYMBICORT) 160-4.5 MCG/ACT inhaler    Sig: Inhale 2 puffs into the lungs in the morning and at bedtime.    Dispense:  3 each    Refill:  3   Return in about 1 year (around 02/24/2022), or if symptoms worsen or fail to improve, for with APP or Dr. Valeta Harms.    Please do your part to reduce the spread of COVID-19.

## 2021-02-27 ENCOUNTER — Telehealth: Payer: Self-pay

## 2021-02-27 NOTE — Telephone Encounter (Signed)
Pt called with questions about activity level after surgery and pain management. His questions have been answered. No further questions/concerns at this time.

## 2021-02-27 NOTE — Progress Notes (Addendum)
Surgical Instructions    Your procedure is scheduled on 03/03/21.  Report to [facility] Main Entrance "A" at 5:30 A.M., then check in with the Admitting office.  Call this number if you have problems the morning of surgery:  [phone]   If you have any questions prior to your surgery date call [phone]: Open Monday-Friday 8am-4pm    Remember:  Do not eat or drink after midnight the night before your surgery     Take these medicines the morning of surgery with A SIP OF WATER:  baclofen (LIORESAL) budesonide-formoterol (SYMBICORT)  buPROPion (WELLBUTRIN XL) gabapentin (NEURONTIN) mirabegron ER (MYRBETRIQ) pantoprazole (PROTONIX)  tamsulosin (FLOMAX)   As Needed: acetaminophen (TYLENOL)  albuterol (VENTOLIN HFA) Polyvinyl Alcohol (LIQUID TEARS OP) sodium chloride (OCEAN)   As of today, STOP taking any Aspirin (unless otherwise instructed by your surgeon) Aleve, Naproxen, Ibuprofen, Motrin, Advil, Goody's, BC's, all herbal medications, fish oil, and all vitamins.   After your COVID test   You are not required to quarantine however you are required to wear a well-fitting mask when you are out and around people not in your household.  If your mask becomes wet or soiled, replace with a new one.  Wash your hands often with soap and water for 20 seconds or clean your hands with an alcohol-based hand sanitizer that contains at least 60% alcohol.  Do not share personal items.  Notify your provider: if you are in close contact with someone who has COVID  or if you develop a fever of 100.4 or greater, sneezing, cough, sore throat, shortness of breath or body aches.          Do not wear jewelry  Do not wear lotions, powders, colognes, or deodorant. Do not shave 48 hours prior to surgery.  Men may shave face and neck. Do not bring valuables to the hospital.              [facility] is not responsible for any belongings or valuables.  Do NOT Smoke (Tobacco/Vaping)  24  hours prior to your procedure  If you use a CPAP at night, you may bring your mask for your overnight stay.   Contacts, glasses, hearing aids, dentures or partials may not be worn into surgery, please bring cases for these belongings   For patients admitted to the hospital, discharge time will be determined by your treatment team.   Patients discharged the day of surgery will not be allowed to drive home, and someone needs to stay with them for 24 hours.  NO VISITORS WILL BE ALLOWED IN PRE-OP WHERE PATIENTS ARE PREPPED FOR SURGERY.  ONLY 1 SUPPORT PERSON MAY BE PRESENT IN THE WAITING ROOM WHILE YOU ARE IN SURGERY.  IF YOU ARE TO BE ADMITTED, ONCE YOU ARE IN YOUR ROOM YOU WILL BE ALLOWED TWO (2) VISITORS. 1 (ONE) VISITOR MAY STAY OVERNIGHT BUT MUST ARRIVE TO THE ROOM BY 8pm.  Minor children may have two parents present. Special consideration for safety and communication needs will be reviewed on a case by case basis.  Special instructions:    Oral Hygiene is also important to reduce your risk of infection.  Remember - BRUSH YOUR TEETH THE MORNING OF SURGERY WITH YOUR REGULAR TOOTHPASTE   [facility]- Preparing For Surgery  Before surgery, you can play an important role. Because skin is not sterile, your skin needs to be as free of germs as possible. You can reduce the number of germs on your skin by washing  with CHG (chlorahexidine gluconate) Soap before surgery.  CHG is an antiseptic cleaner which kills germs and bonds with the skin to continue killing germs even after washing.     Please do not use if you have an allergy to CHG or antibacterial soaps. If your skin becomes reddened/irritated stop using the CHG.  Do not shave (including legs and underarms) for at least 48 hours prior to first CHG shower. It is OK to shave your face.  Please follow these instructions carefully.     Shower the NIGHT BEFORE SURGERY and the MORNING OF SURGERY with CHG Soap.   If you chose to wash your hair,  wash your hair first as usual with your normal shampoo. After you shampoo, rinse your hair and body thoroughly to remove the shampoo.  Then [employer] and genitals (private parts) with your normal soap and rinse thoroughly to remove soap.  After that Use CHG Soap as you would any other liquid soap. You can apply CHG directly to the skin and wash gently with a scrungie or a clean washcloth.   Apply the CHG Soap to your body ONLY FROM THE NECK DOWN.  Do not use on open wounds or open sores. Avoid contact with your eyes, ears, mouth and genitals (private parts). Wash Face and genitals (private parts)  with your normal soap.   Wash thoroughly, paying special attention to the area where your surgery will be performed.  Thoroughly rinse your body with warm water from the neck down.  DO NOT shower/wash with your normal soap after using and rinsing off the CHG Soap.  Pat yourself dry with a CLEAN TOWEL.  Wear CLEAN PAJAMAS to bed the night before surgery  Place CLEAN SHEETS on your bed the night before your surgery  DO NOT SLEEP WITH PETS.   Day of Surgery:  Take a shower with CHG soap. Wear Clean/Comfortable clothing the morning of surgery Do not apply any deodorants/lotions.   Remember to brush your teeth WITH YOUR REGULAR TOOTHPASTE.   Please read over the following fact sheets that you were given.

## 2021-02-28 ENCOUNTER — Encounter (HOSPITAL_COMMUNITY): Payer: Self-pay

## 2021-02-28 ENCOUNTER — Encounter (HOSPITAL_COMMUNITY)
Admission: RE | Admit: 2021-02-28 | Discharge: 2021-02-28 | Disposition: A | Discharge status: Home / Self Care | Payer: Medicare Other | Source: Ambulatory Visit | Attending: Vascular Surgery | Admitting: Vascular Surgery

## 2021-02-28 ENCOUNTER — Other Ambulatory Visit: Payer: Self-pay

## 2021-02-28 VITALS — BP 134/106 | HR 66 | Temp 98.0°F | Resp 17 | Ht 74.0 in | Wt 207.6 lb

## 2021-02-28 DIAGNOSIS — N281 Cyst of kidney, acquired: Secondary | ICD-10-CM | POA: Insufficient documentation

## 2021-02-28 DIAGNOSIS — Z7982 Long term (current) use of aspirin: Secondary | ICD-10-CM | POA: Insufficient documentation

## 2021-02-28 DIAGNOSIS — N200 Calculus of kidney: Secondary | ICD-10-CM | POA: Insufficient documentation

## 2021-02-28 DIAGNOSIS — Z20822 Contact with and (suspected) exposure to covid-19: Secondary | ICD-10-CM | POA: Insufficient documentation

## 2021-02-28 DIAGNOSIS — I7143 Infrarenal abdominal aortic aneurysm, without rupture: Secondary | ICD-10-CM

## 2021-02-28 DIAGNOSIS — Z7901 Long term (current) use of anticoagulants: Secondary | ICD-10-CM | POA: Insufficient documentation

## 2021-02-28 DIAGNOSIS — Z419 Encounter for procedure for purposes other than remedying health state, unspecified: Secondary | ICD-10-CM

## 2021-02-28 DIAGNOSIS — I2510 Atherosclerotic heart disease of native coronary artery without angina pectoris: Secondary | ICD-10-CM | POA: Insufficient documentation

## 2021-02-28 DIAGNOSIS — K429 Umbilical hernia without obstruction or gangrene: Secondary | ICD-10-CM | POA: Insufficient documentation

## 2021-02-28 DIAGNOSIS — J984 Other disorders of lung: Secondary | ICD-10-CM | POA: Insufficient documentation

## 2021-02-28 DIAGNOSIS — Z01812 Encounter for preprocedural laboratory examination: Secondary | ICD-10-CM | POA: Insufficient documentation

## 2021-02-28 DIAGNOSIS — Z7951 Long term (current) use of inhaled steroids: Secondary | ICD-10-CM | POA: Insufficient documentation

## 2021-02-28 DIAGNOSIS — J4520 Mild intermittent asthma, uncomplicated: Secondary | ICD-10-CM | POA: Insufficient documentation

## 2021-02-28 DIAGNOSIS — Z01818 Encounter for other preprocedural examination: Secondary | ICD-10-CM

## 2021-02-28 HISTORY — DX: Angina pectoris, unspecified: I20.9

## 2021-02-28 HISTORY — DX: Personal history of urinary calculi: Z87.442

## 2021-02-28 LAB — CBC
HCT: 43.1 % (ref 39.0–52.0)
Hemoglobin: 14.3 g/dL (ref 13.0–17.0)
MCH: 31.2 pg (ref 26.0–34.0)
MCHC: 33.2 g/dL (ref 30.0–36.0)
MCV: 93.9 fL (ref 80.0–100.0)
Platelets: 196 10*3/uL (ref 150–400)
RBC: 4.59 MIL/uL (ref 4.22–5.81)
RDW: 14.3 % (ref 11.5–15.5)
WBC: 5.1 10*3/uL (ref 4.0–10.5)
nRBC: 0 % (ref 0.0–0.2)

## 2021-02-28 LAB — URINALYSIS, ROUTINE W REFLEX MICROSCOPIC
Bacteria, UA: NONE SEEN
Bilirubin Urine: NEGATIVE
Glucose, UA: NEGATIVE mg/dL
Hgb urine dipstick: NEGATIVE
Ketones, ur: NEGATIVE mg/dL
Leukocytes,Ua: NEGATIVE
Nitrite: NEGATIVE
Protein, ur: NEGATIVE mg/dL
Specific Gravity, Urine: 1.025 (ref 1.005–1.030)
pH: 6 (ref 5.0–8.0)

## 2021-02-28 LAB — COMPREHENSIVE METABOLIC PANEL
ALT: 38 U/L (ref 0–44)
AST: 36 U/L (ref 15–41)
Albumin: 3.9 g/dL (ref 3.5–5.0)
Alkaline Phosphatase: 72 U/L (ref 38–126)
Anion gap: 9 (ref 5–15)
BUN: 17 mg/dL (ref 8–23)
CO2: 23 mmol/L (ref 22–32)
Calcium: 9.1 mg/dL (ref 8.9–10.3)
Chloride: 104 mmol/L (ref 98–111)
Creatinine, Ser: 0.71 mg/dL (ref 0.61–1.24)
GFR, Estimated: >60 mL/min (ref >60)
Glucose, Bld: 122 mg/dL — ABNORMAL HIGH (ref 70–99)
Potassium: 4.5 mmol/L (ref 3.5–5.1)
Sodium: 136 mmol/L (ref 135–145)
Total Bilirubin: 0.7 mg/dL (ref 0.3–1.2)
Total Protein: 6.4 g/dL — ABNORMAL LOW (ref 6.5–8.1)

## 2021-02-28 LAB — BLOOD GAS, ARTERIAL
Acid-Base Excess: 1.2 mmol/L (ref 0.0–2.0)
Bicarbonate: 25.7 mmol/L (ref 20.0–28.0)
Drawn by: 60286
FIO2: 21
O2 Saturation: 97.1 %
Patient temperature: 37
pCO2 arterial: 44.2 mmHg (ref 32.0–48.0)
pH, Arterial: 7.383 (ref 7.350–7.450)
pO2, Arterial: 97.4 mmHg (ref 83.0–108.0)

## 2021-02-28 LAB — TYPE AND SCREEN
ABO/RH(D): A NEG
Antibody Screen: NEGATIVE

## 2021-02-28 LAB — PROTIME-INR
INR: 0.8 (ref 0.8–1.2)
Prothrombin Time: 11.5 seconds (ref 11.4–15.2)

## 2021-02-28 LAB — APTT: aPTT: 36 seconds (ref 24–36)

## 2021-02-28 LAB — SARS CORONAVIRUS 2 BY RT PCR (HOSPITAL ORDER, PERFORMED IN ~~LOC~~ HOSPITAL LAB): SARS Coronavirus 2: NEGATIVE

## 2021-02-28 LAB — SURGICAL PCR SCREEN
MRSA, PCR: NEGATIVE
Staphylococcus aureus: NEGATIVE

## 2021-02-28 LAB — GLUCOSE, CAPILLARY: Glucose-Capillary: 114 mg/dL — ABNORMAL HIGH (ref 70–99)

## 2021-02-28 NOTE — Progress Notes (Addendum)
PCP - Dr. Derinda Late Cardiologist - Dr. Harrell Gave at [facility]  Chest x-ray - 06/06/20 EKG - 02/24/21 Stress Test - 07/08/07 ECHO - 08/11/07 Cardiac Cath - 07/14/07  Sleep Study - Yes no OSA  DM - Denies  Aspirin Instructions: Continue through day of surgery  COVID TEST- 02/28/21   Anesthesia review: Yes cardiac history clearance with Dr. Harrell Gave in [place]  Patient denies shortness of breath, fever, cough and chest pain at PAT appointment   All instructions explained to the patient, with a verbal understanding of the material. Patient agrees to go over the instructions while at home for a better understanding. Patient also instructed to wear a mask while in public after being tested for COVID-19. The opportunity to ask questions was provided.

## 2021-03-01 NOTE — Progress Notes (Signed)
Anesthesia Chart Review:  Follows with cardiology for history of CAD.  Per cardiology notes, he had a cardiac catheterization at [facility] in 2009 which 70% mid LAD disease only, not intervened on as there was no mention ischemia on stress testing, EF 65%.  Other vessels were nonobstructive.  He is noted to have chronic musculoskeletal chest pain that occurs with specific movements.  He can complete greater than 4 METS of exercise.  He was seen by Nicholes Rough, PA-C 02/23/2021 for preop clearance.  Per note, "[name]'s perioperative risk of a major cardiac event is 6.6% according to the Revised Cardiac Risk Index (RCRI).  Therefore, he is at high risk for perioperative complications.   His functional capacity is good at 6.36 METs according to the Duke Activity Status Index (DASI). Recommendations: According to ACC/AHA guidelines, no further cardiovascular testing needed.  The patient may proceed to surgery at acceptable risk. Antiplatelet and/or Anticoagulation Recommendations: The patient should remain on Aspirin without interruption."  Follows with pulmonology for history of mild intermittent asthma.  He is maintained on Symbicort and as needed albuterol.  He was seen by Dr. Valeta Harms 02/24/2021 for preop evaluation.  Per note, "I see no issues from a respiratory standpoint regarding undergoing surgery. Continue Symbicort inhaler 1 puff, twice daily with spacer. Continue albuterol for shortness of breath and wheezing as needed. Would continue daily antihistamine during peak allergy season. Continue daily Singulair."  Recently underwent right TKA 2/42/3536 without complication.  Preop labs reviewed, unremarkable.  EKG 02/24/2021: NSR.  Rate 63.  CHEST - 2 VIEW 06/06/2020: COMPARISON:  None.   FINDINGS: Normal heart size. Mildly tortuous atherosclerotic thoracic aorta. Otherwise normal mediastinal contour. No pneumothorax. No pleural effusion. Mild linear scarring at the right lung base. No pulmonary edema.  No acute consolidative airspace disease.   IMPRESSION: Mild linear scarring at the right lung base. Otherwise no active cardiopulmonary disease.  CTA abdomen pelvis 01/05/2021: IMPRESSION: VASCULAR   1. 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 The aneurysm is located along the left side of the abdominal aorta with a maximum dimension of 2.6 cm. Recommend referral to a vascular specialist. This recommendation follows ACR consensus guidelines: White Paper of the ACR Incidental Findings Committee II on Vascular Findings. J Am Coll Radiol 2013; 10:789-794. 2. 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 3. Focal ectasia in the distal splenic artery measuring 0.9 cm. 4. Left-sided IVC.   NON-VASCULAR   1. Right nephrolithiasis.  Negative for hydronephrosis. 2. Bilateral renal cysts. 3. Small umbilical hernia containing fat.    Cath 07/14/2007 (care everywhere): DIAGNOSTIC SUMMARY    Coronary Artery Disease      Left Main:  insignificant      LAD system:  significant      LCX system:  insignificant      RCA system:  insignificant      Number of vessels with significant CAD:  1 - Vessel    Left Ventriculogram      Ejection Fraction:   65%   FINAL DIAGNOSIS    786.51  Precordial chest pain    794.30  Abnormal functional study, unspecified   COMMENT    Significant LAD disease which was not intervened upon because patient's      chest pain was fairly atypical, his stress test did not show anterior      ischemia, and he has a good exercise tolerance. Trial of  medical      management preferred.     Aortic root was mildly dilated with no evidence for dissection.     Wynonia Musty [facility]/Anesthesiology Phone [phone] 03/01/2021 10:21 AM

## 2021-03-01 NOTE — Anesthesia Preprocedure Evaluation (Addendum)
Anesthesia Evaluation  Patient identified by MRN, date of birth, ID band Patient awake    Reviewed: Allergy & Precautions, NPO status , Patient's Chart, lab work & pertinent test results  History of Anesthesia Complications Negative for: history of anesthetic complications  Airway Mallampati: III  TM Distance: >3 FB Neck ROM: Full    Dental  (+) Dental Advisory Given, Teeth Intact,    Pulmonary asthma , former smoker,    breath sounds clear to auscultation       Cardiovascular (-) hypertension(-) angina+ CAD  (-) Past MI and (-) CHF  Rhythm:Regular     Neuro/Psych neg Seizures PSYCHIATRIC DISORDERS Depression Multiple sclerosis  Neuromuscular disease    GI/Hepatic Neg liver ROS, GERD  Medicated and Controlled,  Endo/Other  negative endocrine ROS  Renal/GU negative Renal ROSLab Results      Component                Value               Date                      CREATININE               0.71                02/28/2021                Musculoskeletal  (+) Arthritis ,   Abdominal   Peds  Hematology negative hematology ROS (+) Lab Results      Component                Value               Date                      WBC                      5.1                 02/28/2021                HGB                      14.3                02/28/2021                HCT                      43.1                02/28/2021                MCV                      93.9                02/28/2021                PLT                      196                 02/28/2021              Anesthesia Other Findings Follows with cardiology for  history of CAD.  Per cardiology notes, he had a cardiac catheterization at [facility] in 2009 which 70% mid LAD disease only, not intervened on as there was no mention ischemia on stress testing, EF 65%.  Other vessels were nonobstructive.  He is noted to have chronic musculoskeletal chest pain that occurs with specific  movements.  He can complete greater than 4 METS of exercise.  He was seen by Nicholes Rough, PA-C 02/23/2021 for preop clearance.  Per note, "Mr.Rupe's perioperative risk of a major cardiac event is6.6% according to the Revised Cardiac Risk Index (RCRI). Therefore, heis at highrisk for perioperative complications. Hisfunctional capacity is goodat 6.36METs according to the Duke Activity Status Index (DASI). Recommendations: According to ACC/AHA guidelines, no further cardiovascular testing needed. The patient may proceed to surgery at acceptable risk. Antiplatelet and/or Anticoagulation Recommendations: The patient should remain on Aspirin without interruption."  Follows with pulmonology for history of mild intermittent asthma.  He is maintained on Symbicort and as needed albuterol.  He was seen by Dr. Valeta Harms 02/24/2021 for preop evaluation.  Per note, "I see no issues from a respiratory standpoint regarding undergoing surgery. Continue Symbicort inhaler 1 puff, twice daily with spacer. Continue albuterol for shortness of breath and wheezing as needed. Would continue daily antihistamine during peak allergy season. Continue daily Singulair."  Recently underwent right TKA 1/93/7902 without complication.  Preop labs reviewed, unremarkable.  EKG 02/24/2021: NSR.  Rate 63.  CHEST - 2 VIEW 06/06/2020: COMPARISON: None.  FINDINGS: Normal heart size. Mildly tortuous atherosclerotic thoracic aorta. Otherwise normal mediastinal contour. No pneumothorax. No pleural effusion. Mild linear scarring at the right lung base. No pulmonary edema. No acute consolidative airspace disease.  IMPRESSION: Mild linear scarring at the right lung base. Otherwise no active cardiopulmonary disease.  CTA abdomen pelvis 01/05/2021: IMPRESSION: VASCULAR  1. 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 The aneurysm is located along the left side of the abdominal aorta with a maximum dimension  of 2.6 cm. Recommend referral to a vascular specialist. This recommendation follows ACR consensus guidelines: White Paper of the ACR Incidental Findings Committee II on Vascular Findings. J Am Coll Radiol 2013; 10:789-794. 2. 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 3. Focal ectasia in the distal splenic artery measuring 0.9 cm. 4. Left-sided IVC.  NON-VASCULAR  1. Right nephrolithiasis. Negative for hydronephrosis. 2. Bilateral renal cysts. 3. Small umbilical hernia containing fat.   Cath 07/14/2007 (care everywhere): DIAGNOSTIC SUMMARY  Coronary Artery Disease  Left Main:insignificant  LAD system:significant  LCX system:insignificant  RCA system:insignificant  Number of vessels with significant CAD:1 - Vessel  Left Ventriculogram  Ejection Fraction: 65%   FINAL DIAGNOSIS  786.51Precordial chest pain  794.30Abnormal functional study, unspecified   COMMENT  Significant LAD disease which was not intervened upon because patient's  chest pain was fairly atypical, his stress test did not show anterior  ischemia, and he has a good exercise tolerance. Trial of medical  management preferred.   Aortic root was mildly dilated with no evidence for dissection.     Wynonia Musty [facility] Short Stay Center/Anesthesiology Phone [phone] 03/01/2021 10:21 AM  Reproductive/Obstetrics                            Anesthesia Physical Anesthesia Plan  ASA: 3  Anesthesia Plan: General   Post-op Pain Management: Minimal or no pain anticipated and  Tylenol PO (pre-op)   Induction: Intravenous  PONV Risk Score and Plan: 2 and Ondansetron and Dexamethasone  Airway Management Planned: Oral ETT  Additional Equipment: Arterial line  Intra-op Plan:   Post-operative Plan: Extubation in  OR  Informed Consent: I have reviewed the patients History and Physical, chart, labs and discussed the procedure including the risks, benefits and alternatives for the proposed anesthesia with the patient or authorized representative who has indicated his/her understanding and acceptance.     Dental advisory given  Plan Discussed with: CRNA and Anesthesiologist  Anesthesia Plan Comments: (PAT note by Karoline Caldwell, PA-C:  Follows with cardiology for history of CAD.  Per cardiology notes, he had a cardiac catheterization at [facility] in 2009 which 70% mid LAD disease only, not intervened on as there was no mention ischemia on stress testing, EF 65%.  Other vessels were nonobstructive.  He is noted to have chronic musculoskeletal chest pain that occurs with specific movements.  He can complete greater than 4 METS of exercise.  He was seen by Nicholes Rough, PA-C 02/23/2021 for preop clearance.  Per note, "Mr.Rosenbach's perioperative risk of a major cardiac event is6.6% according to the Revised Cardiac Risk Index (RCRI). Therefore, heis at highrisk for perioperative complications. Hisfunctional capacity is goodat 6.36METs according to the Duke Activity Status Index (DASI). Recommendations: According to ACC/AHA guidelines, no further cardiovascular testing needed. The patient may proceed to surgery at acceptable risk. Antiplatelet and/or Anticoagulation Recommendations: The patient should remain on Aspirin without interruption."  Follows with pulmonology for history of mild intermittent asthma.  He is maintained on Symbicort and as needed albuterol.  He was seen by Dr. Valeta Harms 02/24/2021 for preop evaluation.  Per note, "I see no issues from a respiratory standpoint regarding undergoing surgery. Continue Symbicort inhaler 1 puff, twice daily with spacer. Continue albuterol for shortness of breath and wheezing as needed. Would continue daily antihistamine during peak allergy season. Continue daily  Singulair."  Recently underwent right TKA 04/03/3233 without complication.  Preop labs reviewed, unremarkable.  EKG 02/24/2021: NSR.  Rate 63.  CHEST - 2 VIEW 06/06/2020: COMPARISON: None.  FINDINGS: Normal heart size. Mildly tortuous atherosclerotic thoracic aorta. Otherwise normal mediastinal contour. No pneumothorax. No pleural effusion. Mild linear scarring at the right lung base. No pulmonary edema. No acute consolidative airspace disease.  IMPRESSION: Mild linear scarring at the right lung base. Otherwise no active cardiopulmonary disease.  CTA abdomen pelvis 01/05/2021: IMPRESSION: VASCULAR  1. 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 The aneurysm is located along the left side of the abdominal aorta with a maximum dimension of 2.6 cm. Recommend referral to a vascular specialist. This recommendation follows ACR consensus guidelines: White Paper of the ACR Incidental Findings Committee II on Vascular Findings. J Am Coll Radiol 2013; 10:789-794. 2. 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 3. Focal ectasia in the distal splenic artery measuring 0.9 cm. 4. Left-sided IVC.  NON-VASCULAR  1. Right nephrolithiasis. Negative for hydronephrosis. 2. Bilateral renal cysts. 3. Small umbilical hernia containing fat.   Cath 07/14/2007 (care everywhere): DIAGNOSTIC SUMMARY  Coronary Artery Disease  Left Main:insignificant  LAD system:significant  LCX system:insignificant  RCA system:insignificant  Number of vessels with significant CAD:1 - Vessel  Left Ventriculogram  Ejection Fraction: 65%   FINAL DIAGNOSIS  786.51Precordial chest pain  794.30Abnormal functional study, unspecified   COMMENT  Significant LAD disease which was not intervened upon because patient's  chest pain was fairly  atypical, his stress test did not show anterior  ischemia, and he has a good exercise tolerance. Trial of medical  management preferred.   Aortic root was mildly dilated with no evidence for dissection.   )       Anesthesia Quick Evaluation

## 2021-03-03 ENCOUNTER — Inpatient Hospital Stay (HOSPITAL_COMMUNITY)
Admission: RE | Admit: 2021-03-03 | Discharge: 2021-03-04 | DRG: 269 | Disposition: A | Discharge status: Home / Self Care | Payer: Medicare Other | Attending: Vascular Surgery | Admitting: Vascular Surgery

## 2021-03-03 ENCOUNTER — Inpatient Hospital Stay (HOSPITAL_COMMUNITY): Payer: Medicare Other | Admitting: Physician Assistant

## 2021-03-03 ENCOUNTER — Inpatient Hospital Stay (HOSPITAL_COMMUNITY): Payer: Medicare Other

## 2021-03-03 ENCOUNTER — Inpatient Hospital Stay (HOSPITAL_COMMUNITY): Payer: Medicare Other | Admitting: Certified Registered Nurse Anesthetist

## 2021-03-03 ENCOUNTER — Encounter (HOSPITAL_COMMUNITY): Payer: Self-pay | Admitting: Vascular Surgery

## 2021-03-03 ENCOUNTER — Other Ambulatory Visit: Payer: Self-pay

## 2021-03-03 ENCOUNTER — Encounter (HOSPITAL_COMMUNITY)
Admission: RE | Disposition: A | Discharge status: Home / Self Care | Payer: Self-pay | Source: Home / Self Care | Attending: Vascular Surgery

## 2021-03-03 DIAGNOSIS — I2510 Atherosclerotic heart disease of native coronary artery without angina pectoris: Secondary | ICD-10-CM | POA: Diagnosis present

## 2021-03-03 DIAGNOSIS — K219 Gastro-esophageal reflux disease without esophagitis: Secondary | ICD-10-CM | POA: Diagnosis present

## 2021-03-03 DIAGNOSIS — Z96651 Presence of right artificial knee joint: Secondary | ICD-10-CM | POA: Diagnosis present

## 2021-03-03 DIAGNOSIS — Z87891 Personal history of nicotine dependence: Secondary | ICD-10-CM

## 2021-03-03 DIAGNOSIS — G35 Multiple sclerosis: Secondary | ICD-10-CM | POA: Diagnosis present

## 2021-03-03 DIAGNOSIS — F32A Depression, unspecified: Secondary | ICD-10-CM | POA: Diagnosis present

## 2021-03-03 DIAGNOSIS — Z20822 Contact with and (suspected) exposure to covid-19: Secondary | ICD-10-CM | POA: Diagnosis present

## 2021-03-03 DIAGNOSIS — M1990 Unspecified osteoarthritis, unspecified site: Secondary | ICD-10-CM | POA: Diagnosis present

## 2021-03-03 DIAGNOSIS — I714 Abdominal aortic aneurysm, without rupture, unspecified: Secondary | ICD-10-CM | POA: Diagnosis present

## 2021-03-03 DIAGNOSIS — Z79899 Other long term (current) drug therapy: Secondary | ICD-10-CM | POA: Diagnosis not present

## 2021-03-03 DIAGNOSIS — Z79891 Long term (current) use of opiate analgesic: Secondary | ICD-10-CM

## 2021-03-03 DIAGNOSIS — I7143 Infrarenal abdominal aortic aneurysm, without rupture: Secondary | ICD-10-CM

## 2021-03-03 DIAGNOSIS — Z7982 Long term (current) use of aspirin: Secondary | ICD-10-CM | POA: Diagnosis not present

## 2021-03-03 DIAGNOSIS — K5909 Other constipation: Secondary | ICD-10-CM | POA: Diagnosis present

## 2021-03-03 DIAGNOSIS — Z8249 Family history of ischemic heart disease and other diseases of the circulatory system: Secondary | ICD-10-CM

## 2021-03-03 DIAGNOSIS — Z8616 Personal history of COVID-19: Secondary | ICD-10-CM

## 2021-03-03 DIAGNOSIS — E7800 Pure hypercholesterolemia, unspecified: Secondary | ICD-10-CM | POA: Diagnosis present

## 2021-03-03 DIAGNOSIS — Z7951 Long term (current) use of inhaled steroids: Secondary | ICD-10-CM | POA: Diagnosis not present

## 2021-03-03 HISTORY — PX: ULTRASOUND GUIDANCE FOR VASCULAR ACCESS: SHX6516

## 2021-03-03 HISTORY — PX: ABDOMINAL AORTIC ENDOVASCULAR STENT GRAFT: SHX5707

## 2021-03-03 LAB — CBC
HCT: 39.6 % (ref 39.0–52.0)
Hemoglobin: 12.9 g/dL — ABNORMAL LOW (ref 13.0–17.0)
MCH: 30.9 pg (ref 26.0–34.0)
MCHC: 32.6 g/dL (ref 30.0–36.0)
MCV: 94.7 fL (ref 80.0–100.0)
Platelets: 188 10*3/uL (ref 150–400)
RBC: 4.18 MIL/uL — ABNORMAL LOW (ref 4.22–5.81)
RDW: 14.3 % (ref 11.5–15.5)
WBC: 6.6 10*3/uL (ref 4.0–10.5)
nRBC: 0 % (ref 0.0–0.2)

## 2021-03-03 LAB — BASIC METABOLIC PANEL
Anion gap: 8 (ref 5–15)
BUN: 13 mg/dL (ref 8–23)
CO2: 27 mmol/L (ref 22–32)
Calcium: 8.5 mg/dL — ABNORMAL LOW (ref 8.9–10.3)
Chloride: 102 mmol/L (ref 98–111)
Creatinine, Ser: 0.65 mg/dL (ref 0.61–1.24)
GFR, Estimated: >60 mL/min (ref >60)
Glucose, Bld: 143 mg/dL — ABNORMAL HIGH (ref 70–99)
Potassium: 4.2 mmol/L (ref 3.5–5.1)
Sodium: 137 mmol/L (ref 135–145)

## 2021-03-03 LAB — APTT: aPTT: 37 seconds — ABNORMAL HIGH (ref 24–36)

## 2021-03-03 LAB — PROTIME-INR
INR: 0.9 (ref 0.8–1.2)
Prothrombin Time: 12.2 seconds (ref 11.4–15.2)

## 2021-03-03 LAB — MAGNESIUM: Magnesium: 2.1 mg/dL (ref 1.7–2.4)

## 2021-03-03 SURGERY — INSERTION, ENDOVASCULAR STENT GRAFT, AORTA, ABDOMINAL
Anesthesia: General | Site: Groin

## 2021-03-03 MED ORDER — CEFAZOLIN SODIUM-DEXTROSE 2-4 GM/100ML-% IV SOLN
2.0000 g | INTRAVENOUS | Status: AC
  Administered 2021-03-03: 2 g via INTRAVENOUS
  Filled 2021-03-03: qty 100

## 2021-03-03 MED ORDER — LABETALOL HCL 5 MG/ML IV SOLN: 10.0000 mg | INTRAVENOUS | Status: DC | PRN

## 2021-03-03 MED ORDER — DOCUSATE SODIUM 100 MG PO CAPS
100.0000 mg | ORAL_CAPSULE | Freq: Every day | ORAL | Status: DC
  Administered 2021-03-04: 100 mg via ORAL
  Filled 2021-03-03: qty 1

## 2021-03-03 MED ORDER — SODIUM CHLORIDE 0.9 % IV SOLN: 500.0000 mL | Freq: Once | INTRAVENOUS | Status: DC | PRN

## 2021-03-03 MED ORDER — LIDOCAINE 2% (20 MG/ML) 5 ML SYRINGE
INTRAMUSCULAR | Status: AC
  Filled 2021-03-03: qty 5

## 2021-03-03 MED ORDER — EPHEDRINE SULFATE-NACL 50-0.9 MG/10ML-% IV SOSY
PREFILLED_SYRINGE | INTRAVENOUS | Status: DC | PRN
  Administered 2021-03-03: 25 mg via INTRAVENOUS

## 2021-03-03 MED ORDER — LACTATED RINGERS IV SOLN: INTRAVENOUS | Status: DC

## 2021-03-03 MED ORDER — ACETAMINOPHEN 500 MG PO TABS: 1000.0000 mg | ORAL_TABLET | Freq: Once | ORAL | Status: DC | PRN

## 2021-03-03 MED ORDER — ROCURONIUM BROMIDE 10 MG/ML (PF) SYRINGE
PREFILLED_SYRINGE | INTRAVENOUS | Status: AC
  Filled 2021-03-03: qty 10

## 2021-03-03 MED ORDER — OXYCODONE HCL 5 MG/5ML PO SOLN: 5.0000 mg | Freq: Once | ORAL | Status: DC | PRN

## 2021-03-03 MED ORDER — 0.9 % SODIUM CHLORIDE (POUR BTL) OPTIME
TOPICAL | Status: DC | PRN
  Administered 2021-03-03: 1000 mL

## 2021-03-03 MED ORDER — CHLORHEXIDINE GLUCONATE CLOTH 2 % EX PADS: 6.0000 | MEDICATED_PAD | Freq: Once | CUTANEOUS | Status: DC

## 2021-03-03 MED ORDER — ONDANSETRON HCL 4 MG/2ML IJ SOLN
INTRAMUSCULAR | Status: DC | PRN
  Administered 2021-03-03: 4 mg via INTRAVENOUS

## 2021-03-03 MED ORDER — TAMSULOSIN HCL 0.4 MG PO CAPS
0.4000 mg | ORAL_CAPSULE | Freq: Every day | ORAL | Status: DC
  Administered 2021-03-04: 0.4 mg via ORAL
  Filled 2021-03-03: qty 1

## 2021-03-03 MED ORDER — DEXAMETHASONE SODIUM PHOSPHATE 10 MG/ML IJ SOLN
INTRAMUSCULAR | Status: DC | PRN
  Administered 2021-03-03: 10 mg via INTRAVENOUS

## 2021-03-03 MED ORDER — LIDOCAINE 2% (20 MG/ML) 5 ML SYRINGE
INTRAMUSCULAR | Status: DC | PRN
  Administered 2021-03-03: 60 mg via INTRAVENOUS

## 2021-03-03 MED ORDER — GABAPENTIN 300 MG PO CAPS
300.0000 mg | ORAL_CAPSULE | Freq: Three times a day (TID) | ORAL | Status: DC
  Administered 2021-03-03 – 2021-03-04 (×2): 300 mg via ORAL
  Filled 2021-03-03 (×2): qty 1

## 2021-03-03 MED ORDER — ACETAMINOPHEN 160 MG/5ML PO SOLN: 1000.0000 mg | Freq: Once | ORAL | Status: DC | PRN

## 2021-03-03 MED ORDER — BACLOFEN 20 MG PO TABS
20.0000 mg | ORAL_TABLET | Freq: Three times a day (TID) | ORAL | Status: DC
  Administered 2021-03-03 – 2021-03-04 (×2): 20 mg via ORAL
  Filled 2021-03-03 (×3): qty 1

## 2021-03-03 MED ORDER — HEPARIN 6000 UNIT IRRIGATION SOLUTION
Status: AC
  Filled 2021-03-03: qty 500

## 2021-03-03 MED ORDER — SALINE SPRAY 0.65 % NA SOLN: 1.0000 | Freq: Four times a day (QID) | NASAL | Status: DC | PRN

## 2021-03-03 MED ORDER — MAGNESIUM SULFATE 2 GM/50ML IV SOLN: 2.0000 g | Freq: Every day | INTRAVENOUS | Status: DC | PRN

## 2021-03-03 MED ORDER — PHENOL 1.4 % MT LIQD: 1.0000 | OROMUCOSAL | Status: DC | PRN

## 2021-03-03 MED ORDER — ROSUVASTATIN CALCIUM 20 MG PO TABS
20.0000 mg | ORAL_TABLET | Freq: Every day | ORAL | Status: DC
  Administered 2021-03-03: 20 mg via ORAL
  Filled 2021-03-03: qty 1

## 2021-03-03 MED ORDER — METOPROLOL TARTRATE 5 MG/5ML IV SOLN
2.0000 mg | INTRAVENOUS | Status: DC | PRN
Start: 2021-03-03 — End: 2021-03-04

## 2021-03-03 MED ORDER — PHENYLEPHRINE 40 MCG/ML (10ML) SYRINGE FOR IV PUSH (FOR BLOOD PRESSURE SUPPORT)
PREFILLED_SYRINGE | INTRAVENOUS | Status: DC | PRN
  Administered 2021-03-03: 80 ug via INTRAVENOUS

## 2021-03-03 MED ORDER — FENTANYL CITRATE (PF) 250 MCG/5ML IJ SOLN
INTRAMUSCULAR | Status: DC | PRN
  Administered 2021-03-03: 150 ug via INTRAVENOUS

## 2021-03-03 MED ORDER — HYDROMORPHONE HCL 1 MG/ML IJ SOLN: 0.5000 mg | INTRAMUSCULAR | Status: DC | PRN

## 2021-03-03 MED ORDER — GUAIFENESIN-DM 100-10 MG/5ML PO SYRP: 15.0000 mL | ORAL_SOLUTION | ORAL | Status: DC | PRN

## 2021-03-03 MED ORDER — MONTELUKAST SODIUM 10 MG PO TABS
10.0000 mg | ORAL_TABLET | Freq: Every day | ORAL | Status: DC
  Administered 2021-03-03: 10 mg via ORAL
  Filled 2021-03-03: qty 1

## 2021-03-03 MED ORDER — MOMETASONE FURO-FORMOTEROL FUM 200-5 MCG/ACT IN AERO
2.0000 | INHALATION_SPRAY | Freq: Two times a day (BID) | RESPIRATORY_TRACT | Status: DC
  Administered 2021-03-03 – 2021-03-04 (×2): 2 via RESPIRATORY_TRACT
  Filled 2021-03-03: qty 8.8

## 2021-03-03 MED ORDER — ALBUTEROL SULFATE (2.5 MG/3ML) 0.083% IN NEBU: 2.5000 mg | INHALATION_SOLUTION | Freq: Four times a day (QID) | RESPIRATORY_TRACT | Status: DC | PRN

## 2021-03-03 MED ORDER — ASPIRIN EC 81 MG PO TBEC
81.0000 mg | DELAYED_RELEASE_TABLET | Freq: Every day | ORAL | Status: DC
  Administered 2021-03-04: 81 mg via ORAL
  Filled 2021-03-03: qty 1

## 2021-03-03 MED ORDER — SODIUM CHLORIDE 0.9 % IV SOLN: INTRAVENOUS | Status: DC

## 2021-03-03 MED ORDER — MIRABEGRON ER 50 MG PO TB24
50.0000 mg | ORAL_TABLET | Freq: Every day | ORAL | Status: DC
  Administered 2021-03-03: 50 mg via ORAL
  Filled 2021-03-03 (×2): qty 1

## 2021-03-03 MED ORDER — ALBUTEROL SULFATE HFA 108 (90 BASE) MCG/ACT IN AERS: 2.0000 | INHALATION_SPRAY | Freq: Four times a day (QID) | RESPIRATORY_TRACT | Status: DC | PRN

## 2021-03-03 MED ORDER — CEFAZOLIN SODIUM-DEXTROSE 2-4 GM/100ML-% IV SOLN
2.0000 g | Freq: Three times a day (TID) | INTRAVENOUS | Status: AC
  Administered 2021-03-03: 2 g via INTRAVENOUS
  Filled 2021-03-03: qty 100

## 2021-03-03 MED ORDER — SENNOSIDES-DOCUSATE SODIUM 8.6-50 MG PO TABS: 1.0000 | ORAL_TABLET | Freq: Every evening | ORAL | Status: DC | PRN

## 2021-03-03 MED ORDER — ACETAMINOPHEN 10 MG/ML IV SOLN: 1000.0000 mg | Freq: Once | INTRAVENOUS | Status: DC | PRN

## 2021-03-03 MED ORDER — POTASSIUM CHLORIDE CRYS ER 20 MEQ PO TBCR
20.0000 meq | EXTENDED_RELEASE_TABLET | Freq: Every day | ORAL | Status: DC | PRN
Start: 2021-03-03 — End: 2021-03-04

## 2021-03-03 MED ORDER — HEPARIN SODIUM (PORCINE) 5000 UNIT/ML IJ SOLN
5000.0000 [IU] | Freq: Three times a day (TID) | INTRAMUSCULAR | Status: DC
  Administered 2021-03-04: 5000 [IU] via SUBCUTANEOUS
  Filled 2021-03-03: qty 1

## 2021-03-03 MED ORDER — ROCURONIUM BROMIDE 10 MG/ML (PF) SYRINGE
PREFILLED_SYRINGE | INTRAVENOUS | Status: DC | PRN
Start: 2021-03-03 — End: 2021-03-03
  Administered 2021-03-03: 80 mg via INTRAVENOUS

## 2021-03-03 MED ORDER — OXYCODONE HCL 5 MG PO TABS: 5.0000 mg | ORAL_TABLET | Freq: Once | ORAL | Status: DC | PRN

## 2021-03-03 MED ORDER — OXYCODONE-ACETAMINOPHEN 5-325 MG PO TABS: 1.0000 | ORAL_TABLET | ORAL | Status: DC | PRN

## 2021-03-03 MED ORDER — BISACODYL 5 MG PO TBEC: 5.0000 mg | DELAYED_RELEASE_TABLET | Freq: Every day | ORAL | Status: DC | PRN

## 2021-03-03 MED ORDER — PROTAMINE SULFATE 10 MG/ML IV SOLN
INTRAVENOUS | Status: DC | PRN
  Administered 2021-03-03: 50 mg via INTRAVENOUS

## 2021-03-03 MED ORDER — HEPARIN SODIUM (PORCINE) 1000 UNIT/ML IJ SOLN
INTRAMUSCULAR | Status: DC | PRN
  Administered 2021-03-03: 5000 [IU] via INTRAVENOUS

## 2021-03-03 MED ORDER — PROPOFOL 10 MG/ML IV BOLUS
INTRAVENOUS | Status: DC | PRN
  Administered 2021-03-03: 150 mg via INTRAVENOUS

## 2021-03-03 MED ORDER — SUGAMMADEX SODIUM 200 MG/2ML IV SOLN
INTRAVENOUS | Status: DC | PRN
  Administered 2021-03-03: 200 mg via INTRAVENOUS

## 2021-03-03 MED ORDER — ACETAMINOPHEN 500 MG PO TABS: 1000.0000 mg | ORAL_TABLET | Freq: Four times a day (QID) | ORAL | Status: DC | PRN

## 2021-03-03 MED ORDER — PANTOPRAZOLE SODIUM 40 MG PO TBEC
40.0000 mg | DELAYED_RELEASE_TABLET | Freq: Every day | ORAL | Status: DC
  Administered 2021-03-04: 40 mg via ORAL
  Filled 2021-03-03: qty 1

## 2021-03-03 MED ORDER — PROPOFOL 10 MG/ML IV BOLUS
INTRAVENOUS | Status: AC
  Filled 2021-03-03: qty 20

## 2021-03-03 MED ORDER — AMPHETAMINE-DEXTROAMPHET ER 5 MG PO CP24: 25.0000 mg | ORAL_CAPSULE | Freq: Every day | ORAL | Status: DC | PRN

## 2021-03-03 MED ORDER — PHENYLEPHRINE HCL-NACL 20-0.9 MG/250ML-% IV SOLN
INTRAVENOUS | Status: DC | PRN
  Administered 2021-03-03: 30 ug/min via INTRAVENOUS

## 2021-03-03 MED ORDER — FENTANYL CITRATE (PF) 100 MCG/2ML IJ SOLN: 25.0000 ug | INTRAMUSCULAR | Status: DC | PRN

## 2021-03-03 MED ORDER — ONDANSETRON HCL 4 MG/2ML IJ SOLN: 4.0000 mg | Freq: Four times a day (QID) | INTRAMUSCULAR | Status: DC | PRN

## 2021-03-03 MED ORDER — DEXAMETHASONE SODIUM PHOSPHATE 10 MG/ML IJ SOLN
INTRAMUSCULAR | Status: AC
  Filled 2021-03-03: qty 2

## 2021-03-03 MED ORDER — IODIXANOL 320 MG/ML IV SOLN
INTRAVENOUS | Status: DC | PRN
  Administered 2021-03-03: 39.9 mL via INTRA_ARTERIAL

## 2021-03-03 MED ORDER — AMPHETAMINE-DEXTROAMPHET ER 25 MG PO CP24: 25.0000 mg | ORAL_CAPSULE | Freq: Every day | ORAL | Status: DC | PRN

## 2021-03-03 MED ORDER — ACETAMINOPHEN 325 MG PO TABS
325.0000 mg | ORAL_TABLET | ORAL | Status: DC | PRN
  Administered 2021-03-03: 650 mg via ORAL
  Filled 2021-03-03: qty 2

## 2021-03-03 MED ORDER — MIDAZOLAM HCL 2 MG/2ML IJ SOLN
INTRAMUSCULAR | Status: AC
  Filled 2021-03-03: qty 2

## 2021-03-03 MED ORDER — MAGNESIUM CITRATE PO SOLN
1.0000 | Freq: Once | ORAL | Status: DC | PRN
  Filled 2021-03-03: qty 296

## 2021-03-03 MED ORDER — BUPROPION HCL ER (XL) 150 MG PO TB24
150.0000 mg | ORAL_TABLET | Freq: Every day | ORAL | Status: DC
  Administered 2021-03-04: 150 mg via ORAL
  Filled 2021-03-03: qty 1

## 2021-03-03 MED ORDER — TRAZODONE HCL 100 MG PO TABS
100.0000 mg | ORAL_TABLET | Freq: Every day | ORAL | Status: DC
  Administered 2021-03-03: 100 mg via ORAL
  Filled 2021-03-03: qty 1

## 2021-03-03 MED ORDER — HEPARIN 6000 UNIT IRRIGATION SOLUTION
Status: DC | PRN
  Administered 2021-03-03: 1

## 2021-03-03 MED ORDER — ALUM & MAG HYDROXIDE-SIMETH 200-200-20 MG/5ML PO SUSP: 15.0000 mL | ORAL | Status: DC | PRN

## 2021-03-03 MED ORDER — HYDRALAZINE HCL 20 MG/ML IJ SOLN: 5.0000 mg | INTRAMUSCULAR | Status: DC | PRN

## 2021-03-03 MED ORDER — ACETAMINOPHEN 650 MG RE SUPP: 325.0000 mg | RECTAL | Status: DC | PRN

## 2021-03-03 MED ORDER — PANTOPRAZOLE SODIUM 20 MG PO TBEC: 20.0000 mg | DELAYED_RELEASE_TABLET | Freq: Every day | ORAL | Status: DC

## 2021-03-03 MED ORDER — ORAL CARE MOUTH RINSE: 15.0000 mL | Freq: Once | OROMUCOSAL | Status: AC

## 2021-03-03 MED ORDER — CHLORHEXIDINE GLUCONATE 0.12 % MT SOLN
15.0000 mL | Freq: Once | OROMUCOSAL | Status: AC
  Administered 2021-03-03: 15 mL via OROMUCOSAL
  Filled 2021-03-03: qty 15

## 2021-03-03 MED ORDER — FENTANYL CITRATE (PF) 250 MCG/5ML IJ SOLN
INTRAMUSCULAR | Status: AC
  Filled 2021-03-03: qty 5

## 2021-03-03 SURGICAL SUPPLY — 47 items
BAG COUNTER SPONGE SURGICOUNT (BAG) ×3 IMPLANT
BLADE CLIPPER SURG (BLADE) ×3 IMPLANT
CANISTER SUCT 3000ML PPV (MISCELLANEOUS) ×3 IMPLANT
CATH BEACON 5.038 65CM KMP-01 (CATHETERS) IMPLANT
CATH OMNI FLUSH .035X70CM (CATHETERS) ×3 IMPLANT
DERMABOND ADVANCED (GAUZE/BANDAGES/DRESSINGS) ×1
DERMABOND ADVANCED .7 DNX12 (GAUZE/BANDAGES/DRESSINGS) ×2 IMPLANT
DEVICE CLOSURE PERCLS PRGLD 6F (VASCULAR PRODUCTS) ×4 IMPLANT
DRSG TEGADERM 2-3/8X2-3/4 SM (GAUZE/BANDAGES/DRESSINGS) ×3 IMPLANT
DRYSEAL FLEXSHEATH 18FR 33CM (SHEATH) ×1
ELECT REM PT RETURN 9FT ADLT (ELECTROSURGICAL) ×6
ELECTRODE REM PT RTRN 9FT ADLT (ELECTROSURGICAL) ×4 IMPLANT
GAUZE SPONGE 2X2 8PLY STRL LF (GAUZE/BANDAGES/DRESSINGS) ×4 IMPLANT
GLOVE SURG ENC MOIS LTX SZ7.5 (GLOVE) ×3 IMPLANT
GOWN STRL REUS W/ TWL LRG LVL3 (GOWN DISPOSABLE) ×4 IMPLANT
GOWN STRL REUS W/ TWL XL LVL3 (GOWN DISPOSABLE) ×4 IMPLANT
GOWN STRL REUS W/TWL LRG LVL3 (GOWN DISPOSABLE) ×2
GOWN STRL REUS W/TWL XL LVL3 (GOWN DISPOSABLE) ×2
GRAFT BALLN CATH 65CM (STENTS) ×2 IMPLANT
GUIDEWIRE ANGLED .035X150CM (WIRE) IMPLANT
KIT BASIN OR (CUSTOM PROCEDURE TRAY) ×3 IMPLANT
KIT DRAIN CSF ACCUDRAIN (MISCELLANEOUS) IMPLANT
KIT TURNOVER KIT B (KITS) ×3 IMPLANT
NEEDLE PERC 18GX7CM (NEEDLE) IMPLANT
NS IRRIG 1000ML POUR BTL (IV SOLUTION) ×3 IMPLANT
PACK ENDOVASCULAR (PACKS) ×3 IMPLANT
PAD ARMBOARD 7.5X6 YLW CONV (MISCELLANEOUS) ×6 IMPLANT
PERCLOSE PROGLIDE 6F (VASCULAR PRODUCTS) ×6
SET MICROPUNCTURE 5F STIFF (MISCELLANEOUS) ×3 IMPLANT
SHEATH DRYSEAL FLEX 18FR 33CM (SHEATH) ×2 IMPLANT
SHEATH PINNACLE 8F 10CM (SHEATH) ×3 IMPLANT
SPONGE GAUZE 2X2 STER 10/PKG (GAUZE/BANDAGES/DRESSINGS) ×2
STENT GRAFT BALLN CATH 65CM (STENTS) ×1
STENT GRFT THORAC ACS 21X21X10 (Endovascular Graft) ×3 IMPLANT
STOPCOCK MORSE 400PSI 3WAY (MISCELLANEOUS) ×3 IMPLANT
SUT MNCRL AB 4-0 PS2 18 (SUTURE) ×3 IMPLANT
SUT PROLENE 5 0 C 1 24 (SUTURE) IMPLANT
SUT VIC AB 2-0 CT1 27 (SUTURE)
SUT VIC AB 2-0 CT1 TAPERPNT 27 (SUTURE) IMPLANT
SUT VIC AB 3-0 SH 27 (SUTURE)
SUT VIC AB 3-0 SH 27X BRD (SUTURE) IMPLANT
SYR 20ML LL LF (SYRINGE) ×3 IMPLANT
TOWEL GREEN STERILE (TOWEL DISPOSABLE) ×3 IMPLANT
TRAY FOLEY MTR SLVR 16FR STAT (SET/KITS/TRAYS/PACK) ×3 IMPLANT
TUBING INJECTOR 48 (MISCELLANEOUS) ×3 IMPLANT
WIRE AMPLATZ SS-J .035X180CM (WIRE) ×3 IMPLANT
WIRE BENTSON .035X145CM (WIRE) ×3 IMPLANT

## 2021-03-03 NOTE — H&P (Signed)
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  He has been evaluated [place] patient is hopeful to have his surgery at [facility] and has been referred to [place] for further evaluation and second opinion.  He does not have any new back or abdominal pain.  He has never had abdominal surgery other than appendectomy that was performed laparoscopically.  He is a former smoker but quit many years ago.  He takes aspirin but no blood thinners.  He has had a cardiac catheterization in the past with no intervention.  He is [facility]       Past Medical History:  Diagnosis Date   Arthritis     Asthma     Atypical chest pain     Barrett's esophagus     Bilateral cataracts     Chicken pox     Chronic constipation     Coronary artery disease 12/20/2010   Depression     Foreign body (FB) in soft tissue     Former tobacco use     GERD (gastroesophageal reflux disease)     History of 2019 novel coronavirus disease (COVID-19) 04/07/2019   Hyperlipidemia     Hyperplastic colon polyp     Ischemic heart disease 12/20/2010   MS (multiple sclerosis) (HCC)     Shingles     Shingles     Tubular adenoma of colon 08/23/2016         Family History  Problem Relation Age of Onset   Heart disease Mother     Glaucoma Mother     Irregular heart beat Mother     Cardiomyopathy Mother     Colon polyps Mother     Cataracts Mother     Hypertension Father     Heart disease Father     Cancer Father     Esophageal cancer Cousin           Past Surgical History:  Procedure Laterality Date   ADENOIDECTOMY   1960   APPENDECTOMY   1979   ARTHROPLASTY Right 01/02/2011    partial meniscectomy    CARDIAC CATHETERIZATION       CATARACT EXTRACTION, BILATERAL   03/12/2019   COLONOSCOPY   02/05/2008   COLONOSCOPY WITH  PROPOFOL N/A 08/23/2016    Procedure: COLONOSCOPY WITH PROPOFOL;  Surgeon: Lollie Sails, MD;  Location: [facility] ENDOSCOPY;  Service: Endoscopy;  Laterality: N/A;   COLONOSCOPY WITH PROPOFOL N/A 04/29/2020    Procedure: COLONOSCOPY WITH PROPOFOL;  Surgeon: Lesly Rubenstein, MD;  Location: [facility] ENDOSCOPY;  Service: Endoscopy;  Laterality: N/A;   CYSTOSCOPY WITH INSERTION OF UROLIFT N/A 05/20/2020    Procedure: CYSTOSCOPY WITH INSERTION OF UROLIFT;  Surgeon: Billey Co, MD;  Location: [facility] ORS;  Service: Urology;  Laterality: N/A;   ESOPHAGOGASTRODUODENOSCOPY (EGD) WITH PROPOFOL N/A 08/23/2016    Procedure: ESOPHAGOGASTRODUODENOSCOPY (EGD) WITH PROPOFOL;  Surgeon: Lollie Sails, MD;  Location: [facility] ENDOSCOPY;  Service: Endoscopy;  Laterality: N/A;   ESOPHAGOGASTRODUODENOSCOPY (EGD) WITH PROPOFOL N/A 04/29/2020    Procedure: ESOPHAGOGASTRODUODENOSCOPY (EGD) WITH PROPOFOL;  Surgeon: Lesly Rubenstein, MD;  Location: [facility] ENDOSCOPY;  Service: Endoscopy;  Laterality: N/A;   ESOPHAGOGASTRODUODENOSCOPY ENDOSCOPY   02/05/2008   EYE SURGERY       eye  trauma   1970    piece of metal to eye removed in clinic   FRACTURE SURGERY Right      wrist    FRACTURE SURGERY Right      knee; partial meniscectomy   KNEE ARTHROPLASTY Right 10/05/2020    Procedure: COMPUTER ASSISTED TOTAL KNEE ARTHROPLASTY;  Surgeon: Dereck Leep, MD;  Location: [facility] ORS;  Service: Orthopedics;  Laterality: Right;   right wrist surgery       SEPTOPLASTY       TONSILLECTOMY   02/05/2008      Short Social History:  Social History         Tobacco Use   Smoking status: Former      Packs/day: 1.00      Years: 20.00      Pack years: 20.00      Types: Cigarettes      Start date: 1976      Quit date: 09/23/2006      Years since quitting: 14.3   Smokeless tobacco: Never  Substance Use Topics   Alcohol use: No      No Known Allergies         Current Outpatient Medications  Medication Sig Dispense Refill    acetaminophen (TYLENOL) 500 MG tablet Take 1,000 mg by mouth every 6 (six) hours as needed for moderate pain.       albuterol (VENTOLIN HFA) 108 (90 Base) MCG/ACT inhaler Inhale 2 puffs into the lungs every 6 (six) hours as needed for wheezing or shortness of breath. 24 g 3   amphetamine-dextroamphetamine (ADDERALL XR) 25 MG 24 hr capsule Take 25 mg by mouth daily as needed (focus).       aspirin EC 81 MG tablet Take 81 mg by mouth daily. Swallow whole.       b complex vitamins capsule Take 1 capsule by mouth daily.       baclofen (LIORESAL) 20 MG tablet Take 20 mg by mouth 3 (three) times daily.       budesonide-formoterol (SYMBICORT) 160-4.5 MCG/ACT inhaler Inhale 2 puffs into the lungs in the morning and at bedtime. (Patient taking differently: Inhale 1 puff into the lungs 2 (two) times daily.) 3 each 3   buPROPion (WELLBUTRIN XL) 150 MG 24 hr tablet Take 150 mg by mouth daily.       cholecalciferol (VITAMIN D) 1000 units tablet Take 3,000 Units by mouth daily.       diphenhydrAMINE (BENADRYL) 25 MG tablet Take 25 mg by mouth at bedtime.       Fingolimod HCl 0.5 MG CAPS Take 0.5 mg by mouth every morning.       gabapentin (NEURONTIN) 300 MG capsule Take 300 mg by mouth 3 (three) times daily.       mirabegron ER (MYRBETRIQ) 50 MG TB24 tablet Take 1 tablet (50 mg total) by mouth daily. 90 tablet 3   montelukast (SINGULAIR) 10 MG tablet TAKE 1 TABLET AT BEDTIME 90 tablet 0   Multiple Vitamins-Minerals (MULTIVITAMIN WITH MINERALS) tablet Take 1 tablet by mouth daily.       omeprazole (PRILOSEC) 20 MG capsule Take 20 mg by mouth daily.       rosuvastatin (CRESTOR) 20 MG tablet Take 1 tablet (20 mg total) by mouth daily. (Patient taking differently: Take 20 mg by mouth at bedtime.) 90 tablet 3   Spacer/Aero-Holding Chambers (AEROCHAMBER MV) inhaler Use as instructed 1 each 0   tamsulosin (FLOMAX) 0.4 MG CAPS capsule Take 1 capsule (0.4  mg total) by mouth daily. 90 capsule 2   traZODone (DESYREL) 100  MG tablet Take 100 mg by mouth at bedtime.       sulfacetamide (BLEPH-10) 10 % ophthalmic solution Place 1-2 drops into the left eye every 4 (four) hours. X 7 days (Patient not taking: No sig reported) 15 mL 0    No current facility-administered medications for this visit.      Review of Systems  Constitutional:  Constitutional negative. HENT: HENT negative.  Eyes: Eyes negative.  Respiratory: Positive for cough and wheezing.  Cardiovascular: Cardiovascular negative.  GI: Gastrointestinal negative.  Musculoskeletal: Positive for joint pain.       Recent right knee replacement Neurological: Neurological negative. Hematologic: Positive for bruises/bleeds easily.  Psychiatric: Psychiatric negative.         Objective:    Vitals:   03/03/21 0613  BP: (!) 145/82  Pulse: 67  Resp: 18  Temp: 97.8 F (36.6 C)  SpO2: 93%      Physical Exam HENT:     Head: Normocephalic.     Nose:     Comments: Wearing a mask Eyes:     Pupils: Pupils are equal, round, and reactive to light.  Cardiovascular:     Pulses:          Radial pulses are 2+ on the right side and 2+ on the left side.       Popliteal pulses are 2+ on the right side and 2+ on the left side.       Dorsalis pedis pulses are 2+ on the right side and 2+ on the left side.       Posterior tibial pulses are 2+ on the right side and 2+ on the left side.     Comments: Popliteal arteries are not enlarged by physical exam Pulmonary:     Effort: Pulmonary effort is normal.  Abdominal:     General: Abdomen is flat.     Palpations: Abdomen is soft. There is no mass.  Musculoskeletal:        General: Normal range of motion.     Right lower leg: No edema.     Left lower leg: No edema.  Skin:    General: Skin is warm and dry.     Capillary Refill: Capillary refill takes less than 2 seconds.  Neurological:     General: No focal deficit present.     Mental Status: He is alert.  Psychiatric:        Mood and Affect: Mood normal.         Behavior: Behavior normal.        Thought Content: Thought content normal.        Judgment: Judgment normal.      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      Assessment/Plan:    65 year old male with saccular aneurysm of his infrarenal abdominal aorta.  We discussed his options being continued watchful waiting given that we do not know of any inciting incident or previous size.  I discussed with him that he is meets criteria greater than 2 to 2-1/2 cm of saccular aneurysm repair.  I discussed with him the options being open versus endovascular and even amongst the endovascular options he has a tube graft option that would require 1 femoral approach or standard endovascular aneurysm repair option with 2 femoral approaches.  I discussed the risk to his heart and  we will get cardiology to evaluate him.  I have also discussed the risk of contrast to his kidneys, risk of general anesthesia up to including death, risk of blood loss requiring transfusion and specifically risk of injury to the common femoral or common or external iliac arteries which would require more extensive repair.  We also discussed the possibility of conversion to open surgery to be low however not 0%.  He demonstrates good understanding of the risks we will get him scheduled in the near future after cardiology clearance.  He can continue aspirin throughout the perioperative period.         Gill Delrossi C. Donzetta Matters, MD [facility] Office: [phone] Pager: [phone]

## 2021-03-03 NOTE — Anesthesia Postprocedure Evaluation (Signed)
Anesthesia Post Note  Patient: THAMAS APPLEYARD  Procedure(s) Performed: ABDOMINAL AORTIC ENDOVASCULAR STENT GRAFT (Abdomen) ULTRASOUND GUIDANCE FOR VASCULAR ACCESS, LEFT FEMORAL ARTERY (Left: Groin)     Patient location during evaluation: PACU Anesthesia Type: General Level of consciousness: awake and alert Pain management: pain level controlled Vital Signs Assessment: post-procedure vital signs reviewed and stable Respiratory status: spontaneous breathing, nonlabored ventilation, respiratory function stable and patient connected to nasal cannula oxygen Cardiovascular status: blood pressure returned to baseline and stable Postop Assessment: no apparent nausea or vomiting Anesthetic complications: no   No notable events documented.  Last Vitals:  Vitals:   03/03/21 1207 03/03/21 1222  BP: 125/70 126/70  Pulse: 70 67  Resp: 20 13  Temp:  36.8 C  SpO2: 94% 94%    Last Pain:  Vitals:   03/03/21 1207  TempSrc:   PainSc: 0-No pain                 Javonne Louissaint

## 2021-03-03 NOTE — Progress Notes (Signed)
Arrived from [place] notified.  Vital signs per protocol.A&O3.   L groin level 0

## 2021-03-03 NOTE — Transfer of Care (Signed)
Immediate Anesthesia Transfer of Care Note  Patient: Allen Tapia  Procedure(s) Performed: ABDOMINAL AORTIC ENDOVASCULAR STENT GRAFT (Abdomen) ULTRASOUND GUIDANCE FOR VASCULAR ACCESS, LEFT FEMORAL ARTERY (Left: Groin)  Patient Location: PACU  Anesthesia Type:General  Level of Consciousness: awake, alert  and oriented  Airway & Oxygen Therapy: Patient Spontanous Breathing and Patient connected to nasal cannula oxygen  Post-op Assessment: Report given to RN and Post -op Vital signs reviewed and stable  Post vital signs: Reviewed and stable  Last Vitals:  Vitals Value Taken Time  BP 122/67 03/03/21 0907  Temp    Pulse 67 03/03/21 0909  Resp 18 03/03/21 0909  SpO2 97 % 03/03/21 0909  Vitals shown include unvalidated device data.  Last Pain:  Vitals:   03/03/21 0621  TempSrc:   PainSc: 0-No pain         Complications: No notable events documented.

## 2021-03-03 NOTE — Progress Notes (Signed)
Mobility Specialist: Progress Note   03/03/21 1705  Mobility  Activity Ambulated in hall  Level of Assistance Independent after set-up  Assistive Device  (IV Pole)  Distance Ambulated (ft) 470 ft  Mobility Ambulated with assistance in hallway  Mobility Response Tolerated well  Mobility performed by Mobility specialist  $Mobility charge 1 Mobility   Pre-Mobility: 78 HR, 94% SpO2 Post-Mobility: 94 HR, 148/78 BP, 95% SpO2  Pt required minA to sit EOB from supine but was independent to stand as well as during ambulation. Pt had no c/o throughout. Pt back to bed after walk with call bell and phone at his side. RN present in the room.   [facility] Saryiah Bencosme Mobility Specialist Mobility Specialist Phone #1: [phone] Mobility Specialist Phone #2: [phone]

## 2021-03-03 NOTE — Op Note (Signed)
    Patient name: Allen Tapia MRN: [id number] DOB: 1956/02/19 Sex: male  03/03/2021 Pre-operative Diagnosis: saccular aortic aneurysm Post-operative diagnosis:  Same Surgeon:  Erlene Quan C. Donzetta Matters, MD Assistant: Annamarie Major, MD Procedure Performed: 1.  Percutaneous access and closure left common femoral artery 2.  Endovascular tube graft repair of infrarenal saccular aneurysm with Gore CTAG 21 x 21 x 10 cm   Indications: 65 year old male with recently found saccular aneurysm of his infrarenal aorta.  He is now indicated for endovascular repair.  Assistant was necessary to assist with wire exchange and expedite the case.  Findings: The measurement from the lowest left renal artery to the bifurcation was approximately 12 cm.  The graft was deployed seated on the bifurcation.  There is no palpable dorsalis pedis pulses bilaterally and the aneurysm was fully excluded without any evidence of endoleak.   Procedure:  The patient was identified in the holding area and taken to the operating was placed supine operative when general anesthesia was induced.  Her sterilely prepped draped in the bilateral groins abdomen in the usual fashion, antibiotics were ministered timeout was called.  Ultrasound was used to identify the left common femoral artery which was noted to be patent and free of disease and compressible.  Transverse incision was made we divided the soft tissue using hemostat overlying the artery.  The artery was cannulated with direct ultrasound visualization using a micropuncture needle followed by wire and the sheath.  Bentson wire was placed.  The wire tract was dilated with 8 French dilator and 2 Pro-glide devices were deployed at 10 and 2:00 positions.  We then placed an 8 French sheath.  We placed an Omni catheter to the level of just bottom of L1 performed aortogram.  The aorta was marked on screen.  We placed an Amplatz wire and then placed a long 18 [place] Gore dry seal sheath.  Patient  was given 5000 units of heparin.  The device was brought into place and deployed.  It was ironed out with balloon.  Completion angiogram demonstrated filling of the renal arteries the graft was perfectly seated on the bifurcation with good flow in the bilateral common iliac arteries and there were no endoleak's and the aneurysm was fully excluded.  Satisfied with this we removed our catheter over a Bentson wire.  Pro-glide devices were deployed and hemostasis was obtained.  There was good signal in the bilateral feet.  50 mg of protamine was administered.  Sutures were trimmed down to size and the incision was closed in the groin with 4 Monocryl suture and Dermabond was placed at the skin level.  He was then awakened from anesthesia having tolerated procedure well without immediate complication.  All counts were correct at completion.  EBL: 10cc   Jaquelinne Glendening C. Donzetta Matters, MD [facility] [place] Office: [phone] Pager: [phone]

## 2021-03-03 NOTE — Anesthesia Procedure Notes (Addendum)
Arterial Line Insertion Start/End12/11/2020 7:00 AM, 03/03/2021 7:05 AM Performed by: Minerva Ends, CRNA, CRNA  Patient location: Pre-op. Preanesthetic checklist: patient identified, IV checked, site marked, risks and benefits discussed, surgical consent, monitors and equipment checked, pre-op evaluation, timeout performed and anesthesia consent Lidocaine 1% used for infiltration Left, radial was placed Catheter size: 20 G Hand hygiene performed  and maximum sterile barriers used   Attempts: 1 Procedure performed without using ultrasound guided technique. Following insertion, dressing applied and Biopatch. Post procedure assessment: normal and unchanged  Patient tolerated the procedure well with no immediate complications.

## 2021-03-03 NOTE — Anesthesia Procedure Notes (Addendum)
Procedure Name: Intubation Date/Time: 03/03/2021 7:50 AM Performed by: Minerva Ends, CRNA Pre-anesthesia Checklist: Patient identified, Emergency Drugs available, Suction available and Patient being monitored Patient Re-evaluated:Patient Re-evaluated prior to induction Oxygen Delivery Method: Circle system utilized Preoxygenation: Pre-oxygenation with 100% oxygen Induction Type: IV induction Ventilation: Mask ventilation without difficulty Laryngoscope Size: Mac and 3 Grade View: Grade I Tube type: Oral Tube size: 7.5 mm Number of attempts: 1 Airway Equipment and Method: Stylet and Oral airway Placement Confirmation: ETT inserted through vocal cords under direct vision, positive ETCO2 and breath sounds checked- equal and bilateral Secured at: 23 cm Tube secured with: Tape Dental Injury: Teeth and Oropharynx as per pre-operative assessment

## 2021-03-03 NOTE — Progress Notes (Signed)
Pt accidentally removed a-line. He held pressure himself for approximately 5 min. Small hematoma at site, otherwise no active bleed and +2 radial pulse. Will continue to monitor.

## 2021-03-04 DIAGNOSIS — Z95828 Presence of other vascular implants and grafts: Secondary | ICD-10-CM

## 2021-03-04 LAB — CBC
HCT: 39.2 % (ref 39.0–52.0)
Hemoglobin: 13 g/dL (ref 13.0–17.0)
MCH: 31 pg (ref 26.0–34.0)
MCHC: 33.2 g/dL (ref 30.0–36.0)
MCV: 93.3 fL (ref 80.0–100.0)
Platelets: 205 10*3/uL (ref 150–400)
RBC: 4.2 MIL/uL — ABNORMAL LOW (ref 4.22–5.81)
RDW: 14.1 % (ref 11.5–15.5)
WBC: 11.1 10*3/uL — ABNORMAL HIGH (ref 4.0–10.5)
nRBC: 0 % (ref 0.0–0.2)

## 2021-03-04 LAB — POCT ACTIVATED CLOTTING TIME
Activated Clotting Time: 131 seconds
Activated Clotting Time: 179 seconds

## 2021-03-04 LAB — BASIC METABOLIC PANEL
Anion gap: 11 (ref 5–15)
BUN: 13 mg/dL (ref 8–23)
CO2: 25 mmol/L (ref 22–32)
Calcium: 8.2 mg/dL — ABNORMAL LOW (ref 8.9–10.3)
Chloride: 102 mmol/L (ref 98–111)
Creatinine, Ser: 0.81 mg/dL (ref 0.61–1.24)
GFR, Estimated: >60 mL/min (ref >60)
Glucose, Bld: 143 mg/dL — ABNORMAL HIGH (ref 70–99)
Potassium: 4 mmol/L (ref 3.5–5.1)
Sodium: 138 mmol/L (ref 135–145)

## 2021-03-04 MED ORDER — HYDROCODONE-ACETAMINOPHEN 5-325 MG PO TABS: 1.0000 | ORAL_TABLET | Freq: Four times a day (QID) | ORAL | 0 refills | Status: DC | PRN

## 2021-03-04 NOTE — Progress Notes (Signed)
Discharge summary reviewed.  All questions answered.  IV access discontinued.  CCMD notified.  Will assist to private vehicle on departure

## 2021-03-04 NOTE — Plan of Care (Signed)
  Problem: Education: Goal: Knowledge of General Education information will improve Description: Including pain rating scale, medication(s)/side effects and non-pharmacologic comfort measures Outcome: Adequate for Discharge   Problem: Activity: Goal: Risk for activity intolerance will decrease Outcome: Adequate for Discharge   Problem: Nutrition: Goal: Adequate nutrition will be maintained Outcome: Adequate for Discharge   Problem: Safety: Goal: Ability to remain free from injury will improve Outcome: Adequate for Discharge

## 2021-03-04 NOTE — Progress Notes (Addendum)
  Progress Note    03/04/2021 8:29 AM 1 Day Post-Op  Subjective:  No new abd or back pain this morning.  L groin cath site without hematoma   Vitals:   03/04/21 0435 03/04/21 0737  BP: 113/75 (!) 119/93  Pulse: 60 60  Resp: 16 18  Temp: 97.8 F (36.6 C) 97.7 F (36.5 C)  SpO2: 93% 95%   Physical Exam: Lungs:  non labored Incisions:  L groin incision without hematoma Extremities:  palpable DP pulses Abdomen:  soft Neurologic: A&O  CBC    Component Value Date/Time   WBC 11.1 (H) 03/04/2021 0159   RBC 4.20 (L) 03/04/2021 0159   HGB 13.0 03/04/2021 0159   HCT 39.2 03/04/2021 0159   PLT 205 03/04/2021 0159   MCV 93.3 03/04/2021 0159   MCH 31.0 03/04/2021 0159   MCHC 33.2 03/04/2021 0159   RDW 14.1 03/04/2021 0159   LYMPHSABS 0.1 (L) 08/23/2016 0848   MONOABS 0.6 08/23/2016 0848   EOSABS 0.2 08/23/2016 0848   BASOSABS 0.0 08/23/2016 0848    BMET    Component Value Date/Time   NA 138 03/04/2021 0159   K 4.0 03/04/2021 0159   CL 102 03/04/2021 0159   CO2 25 03/04/2021 0159   GLUCOSE 143 (H) 03/04/2021 0159   BUN 13 03/04/2021 0159   CREATININE 0.81 03/04/2021 0159   CALCIUM 8.2 (L) 03/04/2021 0159   GFRNONAA >60 03/04/2021 0159    INR    Component Value Date/Time   INR 0.9 03/03/2021 0930     Intake/Output Summary (Last 24 hours) at 03/04/2021 0829 Last data filed at 03/04/2021 0500 Gross per 24 hour  Intake 2457.5 ml  Output 1090 ml  Net 1367.5 ml     Assessment/Plan:  65 y.o. male is s/p EVAR 1 Day Post-Op   BLE well perfused with symmetrical DP pulses L groin cath site without hematoma Ok for discharge home this morning; Office will arrange CTA a/p in 1 month   Dagoberto Ligas, PA-C [facility] [phone] 03/04/2021 8:29 AM   I agree with the above.  I have seen and evaluated the patient.  He is postoperative day #1, status post endovascular aneurysm repair of his abdominal aorta.  His left groin site is without  complication.  He has palpable pedal pulses.  He denies any abdominal pain.  He is scheduled for discharge home today.  Annamarie Major

## 2021-03-04 NOTE — Discharge Instructions (Signed)
   [facility] [place]   Discharge Instructions  Endovascular Aortic Aneurysm Repair  Please refer to the following instructions for your post-procedure care. Your surgeon or Physician Assistant will discuss any changes with you.  Activity  You are encouraged to walk as much as you can. You can slowly return to normal activities but must avoid strenuous activity and heavy lifting until your doctor tells you it's OK. Avoid activities such as vacuuming or swinging a gold club. It is normal to feel tired for several weeks after your surgery. Do not drive until your doctor gives the OK and you are no longer taking prescription pain medications. It is also normal to have difficulty with sleep habits, eating, and bowel movements after surgery. These will go away with time.  Bathing/Showering  You may shower after you go home. If you have an incision, do not soak in a bathtub, hot tub, or swim until the incision heals completely.  Incision Care  Shower every day. Clean your incision with mild soap and water. Pat the area dry with a clean towel. You do not need a bandage unless otherwise instructed. Do not apply any ointments or creams to your incision. If you clothing is irritating, you may cover your incision with a dry gauze pad.  Diet  Resume your normal diet. There are no special food restrictions following this procedure. A low fat/low cholesterol diet is recommended for all patients with vascular disease. In order to heal from your surgery, it is CRITICAL to get adequate nutrition. Your body requires vitamins, minerals, and protein. Vegetables are the best source of vitamins and minerals. Vegetables also provide the perfect balance of protein. Processed food has little nutritional value, so try to avoid this.  Medications  Resume taking all of your medications unless your doctor or nurse practitioner tells you not to. If your incision is causing pain, you may take  over-the-counter pain relievers such as acetaminophen (Tylenol). If you were prescribed a stronger pain medication, please be aware these medications can cause nausea and constipation. Prevent nausea by taking the medication with a snack or meal. Avoid constipation by drinking plenty of fluids and eating foods with a high amount of fiber, such as fruits, vegetables, and grains. Do not take Tylenol if you are taking prescription pain medications.   Follow up  Our office will schedule a follow-up appointment with a C.T. scan 3-4 weeks after your surgery.  Please call us immediately for any of the following conditions  Severe or worsening pain in your legs or feet or in your abdomen back or chest. Increased pain, redness, drainage (pus) from your incision sit. Increased abdominal pain, bloating, nausea, vomiting or persistent diarrhea. Fever of 101 degrees or higher. Swelling in your leg (s),  Reduce your risk of vascular disease  Stop smoking. If you would like help call [facility] at 1-800-QUIT-NOW (1-800-784-8669) or [place] at 336-586-4000. Manage your cholesterol Maintain a desired weight Control your diabetes Keep your blood pressure down  If you have questions, please call the office at [phone].   

## 2021-03-05 NOTE — Discharge Summary (Signed)
[facility] Discharge Summary   Allen Tapia 1955-09-26 65 y.o. male  MRN: [id number]  Admission Date: 03/03/2021  Discharge Date: 03/04/21  Physician: No att. providers found  Admission Diagnosis: AAA (abdominal aortic aneurysm) [I71.40]  Discharge Day services:    See prgoress note 03/04/21  Hospital Course:  The patient was admitted to the hospital and taken to the operating room on 03/03/2021 and underwent: Endovascular repair of abdominal aortic aneurysm.  POD #1 he denies any new or changing abdominal or back pain.  Left groin catheterization site was without hematoma.  Bilateral lower extremities were well perfused with symmetrical DP pulses.  He was prescribed 1 to 2 days of narcotic pain medication for continued postoperative pain control.  He will follow-up in 1 month with a CTA of the abdomen/pelvis.  He was discharged home in stable condition.  CBC    Component Value Date/Time   WBC 11.1 (H) 03/04/2021 0159   RBC 4.20 (L) 03/04/2021 0159   HGB 13.0 03/04/2021 0159   HCT 39.2 03/04/2021 0159   PLT 205 03/04/2021 0159   MCV 93.3 03/04/2021 0159   MCH 31.0 03/04/2021 0159   MCHC 33.2 03/04/2021 0159   RDW 14.1 03/04/2021 0159   LYMPHSABS 0.1 (L) 08/23/2016 0848   MONOABS 0.6 08/23/2016 0848   EOSABS 0.2 08/23/2016 0848   BASOSABS 0.0 08/23/2016 0848    BMET    Component Value Date/Time   NA 138 03/04/2021 0159   K 4.0 03/04/2021 0159   CL 102 03/04/2021 0159   CO2 25 03/04/2021 0159   GLUCOSE 143 (H) 03/04/2021 0159   BUN 13 03/04/2021 0159   CREATININE 0.81 03/04/2021 0159   CALCIUM 8.2 (L) 03/04/2021 0159   GFRNONAA >60 03/04/2021 0159         Discharge Diagnosis:  AAA (abdominal aortic aneurysm) [I71.40]  Secondary Diagnosis: Patient Active Problem List   Diagnosis Date Noted   AAA (abdominal aortic aneurysm) 03/03/2021   AAA (abdominal aortic aneurysm) without rupture 01/06/2021   Status post total right knee replacement 10/05/2020    Primary osteoarthritis of right knee 06/05/2020   Mild intermittent asthma 12/07/2019   Pure hypercholesterolemia 05/27/2019   Status post laser cataract surgery, left 02/11/2019   Barrett's esophagus 10/10/2016   Chronic constipation 10/10/2016   Ankylosis of right elbow joint 04/11/2015   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   Encounter for long-term (current) use of other medications 06/08/2013   Dyspnea, unspecified 04/07/2012   Multiple sclerosis ([place]) 01/31/2012   Acid reflux 12/20/2010   CAD (coronary artery disease) 12/20/2010   Depression 12/20/2010   Family history of heart disease 12/20/2010   Foreign body 12/20/2010   Hyperlipemia 12/20/2010   Tobacco abuse 12/20/2010   Past Medical History:  Diagnosis Date   Anginal pain ([place])    atypical chest pain cardiac cath performed at that time   Arthritis    Asthma    Atypical chest pain    Barrett's esophagus    Bilateral cataracts    Chicken pox    Chronic constipation    Coronary artery disease 12/20/2010   Depression    Foreign body (FB) in soft tissue    Former tobacco use    GERD (gastroesophageal reflux disease)    History of 2019 novel coronavirus disease (COVID-19) 04/07/2019   History of kidney stones    Hyperlipidemia    Hyperplastic colon polyp    Ischemic heart disease 12/20/2010  MS (multiple sclerosis) ([place])    Shingles    Shingles    Tubular adenoma of colon 08/23/2016     Allergies as of 03/04/2021   No Known Allergies      Medication List     TAKE these medications    acetaminophen 500 MG tablet Commonly known as: TYLENOL Take 1,000 mg by mouth every 6 (six) hours as needed for moderate pain.   AeroChamber MV inhaler Use as instructed   albuterol 108 (90 Base) MCG/ACT inhaler Commonly known as: VENTOLIN HFA Inhale 2 puffs into the lungs every 6 (six) hours as needed for wheezing or shortness of breath.    amphetamine-dextroamphetamine 25 MG 24 hr capsule Commonly known as: ADDERALL XR Take 25 mg by mouth daily as needed (focus).   aspirin EC 81 MG tablet Take 81 mg by mouth daily. Swallow whole.   b complex vitamins capsule Take 1 capsule by mouth at bedtime.   baclofen 20 MG tablet Commonly known as: LIORESAL Take 20 mg by mouth 3 (three) times daily.   budesonide-formoterol 160-4.5 MCG/ACT inhaler Commonly known as: Symbicort Inhale 2 puffs into the lungs in the morning and at bedtime.   buPROPion 150 MG 24 hr tablet Commonly known as: WELLBUTRIN XL Take 150 mg by mouth daily.   gabapentin 300 MG capsule Commonly known as: NEURONTIN Take 300 mg by mouth 3 (three) times daily.   HYDROcodone-acetaminophen 5-325 MG tablet Commonly known as: Norco Take 1 tablet by mouth every 6 (six) hours as needed for moderate pain.   LIQUID TEARS OP Place 1 drop into both eyes daily as needed (dry eyes).   mirabegron ER 50 MG Tb24 tablet Commonly known as: MYRBETRIQ Take 1 tablet (50 mg total) by mouth daily. What changed: when to take this   montelukast 10 MG tablet Commonly known as: SINGULAIR TAKE 1 TABLET AT BEDTIME   multivitamin with minerals tablet Take 1 tablet by mouth daily.   omeprazole 40 MG capsule Commonly known as: PRILOSEC Take 40 mg by mouth every evening.   pantoprazole 20 MG tablet Commonly known as: PROTONIX Take 20 mg by mouth daily.   rosuvastatin 20 MG tablet Commonly known as: CRESTOR Take 1 tablet (20 mg total) by mouth daily. What changed: when to take this   sodium chloride 0.65 % Soln nasal spray Commonly known as: OCEAN Place 1 spray into both nostrils 4 (four) times daily as needed for congestion.   tamsulosin 0.4 MG Caps capsule Commonly known as: FLOMAX Take 1 capsule (0.4 mg total) by mouth daily.   traZODone 100 MG tablet Commonly known as: DESYREL Take 100 mg by mouth at bedtime.   VITAMIN D PO Take 1 capsule by mouth daily.         Discharge Instructions:   [facility]  Discharge Instructions Endovascular Aortic Aneurysm Repair  Please refer to the following instructions for your post-procedure care. Your surgeon or Physician Assistant will discuss any changes with you.  Activity  You are encouraged to walk as much as you can. You can slowly return to normal activities but must avoid strenuous activity and heavy lifting until your doctor tells you it's OK. Avoid activities such as vacuuming or swinging a gold club. It is normal to feel tired for several weeks after your surgery. Do not drive until your doctor gives the OK and you are no longer taking prescription pain medications. It is also normal to have difficulty with sleep habits,  eating, and bowel movements after surgery. These will go away with time.  Bathing/Showering  You may shower after you go home. If you have an incision, do not soak in a bathtub, hot tub, or swim until the incision heals completely.  Incision Care  Shower every day. Clean your incision with mild soap and water. Pat the area dry with a clean towel. You do not need a bandage unless otherwise instructed. Do not apply any ointments or creams to your incision. If you clothing is irritating, you may cover your incision with a dry gauze pad.  Diet  Resume your normal diet. There are no special food restrictions following this procedure. A low fat/low cholesterol diet is recommended for all patients with vascular disease. In order to heal from your surgery, it is CRITICAL to get adequate nutrition. Your body requires vitamins, minerals, and protein. Vegetables are the best source of vitamins and minerals. Vegetables also provide the perfect balance of protein. Processed food has little nutritional value, so try to avoid this.  Medications  Resume taking all of your medications unless your doctor or Physician Assistnat tells you not to. If your incision  is causing pain, you may take over-the-counter pain relievers such as acetaminophen (Tylenol). If you were prescribed a stronger pain medication, please be aware these medications can cause nausea and constipation. Prevent nausea by taking the medication with a snack or meal. Avoid constipation by drinking plenty of fluids and eating foods with a high amount of fiber, such as fruits, vegetables, and grains. Do not take Tylenol if you are taking prescription pain medications.   Follow up  [place] office will schedule a follow-up appointment with a C.T. scan 3-4 weeks after your surgery.  Please call us immediately for any of the following conditions  Severe or worsening pain in your legs or feet or in your abdomen back or chest. Increased pain, redness, drainage (pus) from your incision sit. Increased abdominal pain, bloating, nausea, vomiting or persistent diarrhea. Fever of 101 degrees or higher. Swelling in your leg (s),  Reduce your risk of vascular disease  Stop smoking. If you would like help call [facility] at 1-800-QUIT-NOW 340-774-9006) or [place] at [phone]. Manage your cholesterol Maintain a desired weight Control your diabetes Keep your blood pressure down  If you have questions, please call the office at [phone].     Disposition: home  Patient's condition: is Good  Follow up: 1. Dr. Donzetta Matters in 4 weeks with CTA protocol   Dagoberto Ligas, PA-C [facility] [phone] 03/05/2021  8:20 AM   - For VQI Registry use - Post-op:  Time to Extubation: [x]  In OR, [ ]  < 12 hrs, [ ]  12-24 hrs, [ ]  >=24 hrs Vasopressors Req. Post-op: No MI: No., [ ]  Troponin only, [ ]  EKG or Clinical New Arrhythmia: No CHF: No ICU Stay: 0 day in ICU Transfusion: No    Complications: Resp failure: No., [ ]  Pneumonia, [ ]  Ventilator Chg in renal function: No., [ ]  Inc. Cr > 0.5, [ ]  Temp. Dialysis,  [ ]  Permanent dialysis Leg ischemia: No., no Surgery  needed, [ ]  Yes, Surgery needed,  [ ]  Amputation Bowel ischemia: No., [ ]  Medical Rx, [ ]  Surgical Rx Wound complication: No., [ ]  Superficial separation/infection, [ ]  Return to OR Return to OR: No  Return to OR for bleeding: No Stroke: No., [ ]  Minor, [ ]  Major  Discharge medications: Statin use:  Yes  ASA use:  Yes  Plavix use:  No  Beta blocker use:  No  ARB use:  No ACEI use:  No CCB use:  No

## 2021-03-06 ENCOUNTER — Encounter (HOSPITAL_COMMUNITY): Payer: Self-pay | Admitting: Vascular Surgery

## 2021-03-10 ENCOUNTER — Other Ambulatory Visit: Payer: Self-pay | Admitting: *Deleted

## 2021-03-10 DIAGNOSIS — I7143 Infrarenal abdominal aortic aneurysm, without rupture: Secondary | ICD-10-CM

## 2021-04-05 ENCOUNTER — Ambulatory Visit
Admission: RE | Admit: 2021-04-05 | Discharge: 2021-04-05 | Disposition: A | Discharge status: Home / Self Care | Payer: Medicare Other | Source: Ambulatory Visit | Attending: Vascular Surgery | Admitting: Vascular Surgery

## 2021-04-05 DIAGNOSIS — I7143 Infrarenal abdominal aortic aneurysm, without rupture: Secondary | ICD-10-CM

## 2021-04-05 MED ORDER — IOPAMIDOL (ISOVUE-370) INJECTION 76%
75.0000 mL | Freq: Once | INTRAVENOUS | Status: AC | PRN
  Administered 2021-04-05: 75 mL via INTRAVENOUS

## 2021-04-07 ENCOUNTER — Ambulatory Visit (INDEPENDENT_AMBULATORY_CARE_PROVIDER_SITE_OTHER): Payer: Medicare Other | Admitting: Vascular Surgery

## 2021-04-07 ENCOUNTER — Encounter (INDEPENDENT_AMBULATORY_CARE_PROVIDER_SITE_OTHER): Payer: Medicare Other

## 2021-04-12 ENCOUNTER — Other Ambulatory Visit: Payer: Self-pay

## 2021-04-12 ENCOUNTER — Encounter: Payer: Self-pay | Admitting: Vascular Surgery

## 2021-04-12 ENCOUNTER — Ambulatory Visit (INDEPENDENT_AMBULATORY_CARE_PROVIDER_SITE_OTHER): Payer: Medicare Other | Admitting: Vascular Surgery

## 2021-04-12 ENCOUNTER — Ambulatory Visit (INDEPENDENT_AMBULATORY_CARE_PROVIDER_SITE_OTHER): Payer: Medicare Other | Admitting: Podiatry

## 2021-04-12 ENCOUNTER — Encounter: Payer: Self-pay | Admitting: Podiatry

## 2021-04-12 VITALS — BP 141/78 | HR 68 | Temp 98.3°F | Resp 20 | Ht 74.0 in | Wt 208.0 lb

## 2021-04-12 DIAGNOSIS — I7143 Infrarenal abdominal aortic aneurysm, without rupture: Secondary | ICD-10-CM

## 2021-04-12 DIAGNOSIS — L603 Nail dystrophy: Secondary | ICD-10-CM

## 2021-04-12 NOTE — Progress Notes (Signed)
He presents today he just wants me to take a look at his hallux nails bilaterally and the third digit on the right foot states that they have blad underneath again and he says they are not follow-up as they routinely do he used to Corticosporin otic for quite some time and really noticed no difference.  Objective: Vital signs are stable he is alert and oriented x3 pulses are palpable.  He does have MS so he has gait disturbance that is more than likely resulting in some chronic irritation of the subungual tissue of the hallux and lesser digits.  Currently the hallux do demonstrate subungual hematoma of the right and discoloration of the left hallux nail.  Third toe demonstrates similarly.  I trimmed these today just to make sure that it did not appear to be anything such as a melanoma but she does not.  Assessment: Subungual hematomas bilateral.  Plan: He is will follow-up with me in a year just to make sure that he continues to do well and I will follow-up with him at that time should he have questions or concerns he will notify us immediately.

## 2021-04-12 NOTE — Progress Notes (Signed)
°  ° °  Subjective:     Patient ID: Allen Tapia, male   DOB: 1955-05-02, 66 y.o.   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  He has done very well from his procedure is back to full activity.  He does not have any ongoing pain.   Review of Systems No issues today    Objective:   Physical Exam Vitals:   04/12/21 1434  BP: (!) 141/78  Pulse: 68  Resp: 20  Temp: 98.3 F (36.8 C)  SpO2: 95%    Awake alert oriented None the respirations Abdomen is soft Left groin without hematoma     CT IMPRESSION: Since CTA AP dated 12/2020;   VASCULAR   1. Interval endograft repair of saccular infrarenal abdominal aortic aneurysm. 2. Decreased excluded sac diameter without evidence of early or delayed enhancement.   NON-VASCULAR   1. No acute abdominopelvic findings. 2. Moderate burden of sigmoid diverticulosis. Additional incidental chronic and senescent findings, as above.   Assessment:     66yo male s/p:  1.  Percutaneous access and closure left common femoral artery 2.  Endovascular tube graft repair of infrarenal saccular aneurysm with Gore CTAG 21 x 21 x 10 cm    Plan:     Follow-up in 1 year with EVAR duplex.     Allen Tapia C. Donzetta Matters, MD [facility] Office: [phone] Pager: [phone]

## 2021-05-04 ENCOUNTER — Other Ambulatory Visit: Payer: Self-pay | Admitting: Cardiology

## 2021-05-04 DIAGNOSIS — I2510 Atherosclerotic heart disease of native coronary artery without angina pectoris: Secondary | ICD-10-CM

## 2021-05-04 NOTE — Telephone Encounter (Signed)
Rx(s) sent to pharmacy electronically.  

## 2021-06-06 ENCOUNTER — Ambulatory Visit: Payer: Medicare Other | Admitting: Primary Care

## 2021-09-10 ENCOUNTER — Other Ambulatory Visit: Payer: Self-pay | Admitting: Primary Care

## 2021-09-11 MED ORDER — ALBUTEROL SULFATE HFA 108 (90 BASE) MCG/ACT IN AERS: 2.0000 | INHALATION_SPRAY | Freq: Four times a day (QID) | RESPIRATORY_TRACT | 3 refills | Status: DC | PRN

## 2021-09-12 MED ORDER — ALBUTEROL SULFATE HFA 108 (90 BASE) MCG/ACT IN AERS: 2.0000 | INHALATION_SPRAY | Freq: Four times a day (QID) | RESPIRATORY_TRACT | 3 refills | Status: DC | PRN

## 2021-09-12 NOTE — Addendum Note (Signed)
Addended by: Valerie Salts on: 09/12/2021 10:49 AM   Modules accepted: Orders

## 2021-09-14 ENCOUNTER — Ambulatory Visit (INDEPENDENT_AMBULATORY_CARE_PROVIDER_SITE_OTHER): Payer: Medicare Other | Admitting: Urology

## 2021-09-14 ENCOUNTER — Encounter: Payer: Self-pay | Admitting: Urology

## 2021-09-14 VITALS — BP 146/78 | HR 65 | Ht 74.0 in | Wt 198.0 lb

## 2021-09-14 DIAGNOSIS — N200 Calculus of kidney: Secondary | ICD-10-CM | POA: Diagnosis not present

## 2021-09-14 DIAGNOSIS — N401 Enlarged prostate with lower urinary tract symptoms: Secondary | ICD-10-CM | POA: Diagnosis not present

## 2021-09-14 DIAGNOSIS — N138 Other obstructive and reflux uropathy: Secondary | ICD-10-CM | POA: Diagnosis not present

## 2021-09-14 DIAGNOSIS — N3281 Overactive bladder: Secondary | ICD-10-CM | POA: Diagnosis not present

## 2021-09-14 LAB — BLADDER SCAN AMB NON-IMAGING

## 2021-09-14 NOTE — Patient Instructions (Signed)
Continue Flomax and try 1 month off of the Myrbetriq.  If the urinary urgency/frequency worsens, would restart the Myrbetriq at a 25 mg dose.

## 2021-09-19 DIAGNOSIS — R7303 Prediabetes: Secondary | ICD-10-CM | POA: Insufficient documentation

## 2021-10-16 ENCOUNTER — Other Ambulatory Visit: Payer: Self-pay

## 2021-10-16 DIAGNOSIS — N3281 Overactive bladder: Secondary | ICD-10-CM

## 2021-10-16 MED ORDER — MIRABEGRON ER 50 MG PO TB24: 50.0000 mg | ORAL_TABLET | Freq: Every day | ORAL | 3 refills | Status: DC

## 2021-10-31 ENCOUNTER — Other Ambulatory Visit: Payer: Self-pay | Admitting: Orthopedic Surgery

## 2021-10-31 ENCOUNTER — Other Ambulatory Visit (HOSPITAL_COMMUNITY): Payer: Self-pay | Admitting: Orthopedic Surgery

## 2021-10-31 DIAGNOSIS — M25562 Pain in left knee: Secondary | ICD-10-CM

## 2021-10-31 DIAGNOSIS — M1712 Unilateral primary osteoarthritis, left knee: Secondary | ICD-10-CM

## 2021-11-05 ENCOUNTER — Ambulatory Visit
Admission: RE | Admit: 2021-11-05 | Discharge: 2021-11-05 | Disposition: A | Discharge status: Home / Self Care | Payer: Medicare Other | Source: Ambulatory Visit | Attending: Orthopedic Surgery | Admitting: Orthopedic Surgery

## 2021-11-05 DIAGNOSIS — M25562 Pain in left knee: Secondary | ICD-10-CM | POA: Insufficient documentation

## 2021-11-05 DIAGNOSIS — M1712 Unilateral primary osteoarthritis, left knee: Secondary | ICD-10-CM | POA: Diagnosis present

## 2021-11-07 ENCOUNTER — Inpatient Hospital Stay: Admission: RE | Admit: 2021-11-07 | Payer: Medicare Other | Source: Ambulatory Visit

## 2021-11-21 ENCOUNTER — Other Ambulatory Visit: Payer: Self-pay | Admitting: Urology

## 2021-11-21 DIAGNOSIS — N138 Other obstructive and reflux uropathy: Secondary | ICD-10-CM

## 2021-11-21 DIAGNOSIS — N3281 Overactive bladder: Secondary | ICD-10-CM

## 2021-12-03 NOTE — Discharge Instructions (Addendum)
Instructions after Knee Arthroscopy    James P. Hooten, Jr., M.D.     Dept. of Orthopaedics & Sports Medicine  [facility]  [address]  [place], [place]  [address]   Phone: [phone]   Fax: [phone]   DIET: Drink plenty of non-alcoholic fluids & begin a light diet. Resume your normal diet the day after surgery.  ACTIVITY:  You may use crutches or a walker with weight-bearing as tolerated, unless instructed otherwise. You may wean yourself off of the walker or crutches as tolerated.  Begin doing gentle exercises. Exercising will reduce the pain and swelling, increase motion, and prevent muscle weakness.   Avoid strenuous activities or athletics for a minimum of 4-6 weeks after arthroscopic surgery. Do not drive or operate any equipment until instructed.  WOUND CARE:  Place one to two pillows under the knee the first day or two when sitting or lying.  Continue to use the ice packs periodically to reduce pain and swelling. The small incisions in your knee are closed with nylon stitches. The stitches will be removed in the office. The bulky dressing may be removed on the second day after surgery. DO NOT TOUCH THE STITCHES. Put a Band-Aid over each stitch. Do NOT use any ointments or creams on the incisions.  You may bathe or shower after the stitches are removed at the first office visit following surgery.  MEDICATIONS: You may resume your regular medications. Please take the pain medication as prescribed. Do not take pain medication on an empty stomach. Do not drive or drink alcoholic beverages when taking pain medications.  CALL THE OFFICE FOR: Temperature above 101 degrees Excessive bleeding or drainage on the dressing. Excessive swelling, coldness, or paleness of the toes. Persistent nausea and vomiting.  FOLLOW-UP:  You should have an appointment to return to the office in 7-10 days after surgery.      [facility] Department Directory          [email or web address]       [email or web address]          Cardiology  Appointments: [place] - [phone] [place] - [phone]  Endocrinology  Appointments: [place] - [phone] [place] - [phone]  Gastroenterology  Appointments: [place] - [phone] [place] - [phone]        General Surgery   Appointments: [place] - [phone]  Internal Medicine/Family Medicine  Appointments: [place] - [phone] [place] - [phone] [place] - [phone]  Metabolic and Weigh Loss Surgery  Appointments: [place] - [phone]        Neurology  Appointments: [place] - [phone] [place] - [phone]  Neurosurgery  Appointments: [place] - [phone]  Obstetrics & Gynecology  Appointments: [place] - [phone] [place] - [phone]        Pediatrics  Appointments: [place] - [phone] [place] - [phone]  Physiatry  Appointments: [place] -[phone]  Physical Therapy  Appointments: [place] - [phone] [place] - [phone]        Podiatry  Appointments: [place] - [phone] [place] - [phone]  Pulmonology  Appointments: [place] - [phone]  Rheumatology  Appointments: [place] - [phone]         Location: [facility]  [address] , [place]  [address]  [place] Location: [facility] [address] [place], [place]  [address]  [place] Location: [facility] [address] [place], [place]  [address]      AMBULATORY SURGERY  DISCHARGE INSTRUCTIONS   The drugs that you were given will stay in your system until tomorrow so for the next 24   hours you should not:  Drive an automobile Make any legal decisions Drink any alcoholic beverage   You may resume regular meals tomorrow.  Today it is better to start with liquids and gradually work up to solid foods.  You may eat anything you prefer, but it is better to  start with liquids, then soup and crackers, and gradually work up to solid foods.   Please notify your doctor immediately if you have any unusual bleeding, trouble breathing, redness and pain at the surgery site, drainage, fever, or pain not relieved by medication.    Additional Instructions:   Please contact your physician with any problems or Same Day Surgery at [phone], Monday through Friday 6 am to 4 pm, or [place] [facility] [place] Main number at [phone]. 

## 2021-12-04 ENCOUNTER — Telehealth: Payer: Self-pay | Admitting: *Deleted

## 2021-12-04 ENCOUNTER — Encounter
Admission: RE | Admit: 2021-12-04 | Discharge: 2021-12-04 | Disposition: A | Discharge status: Home / Self Care | Payer: Medicare Other | Source: Ambulatory Visit | Attending: Orthopedic Surgery | Admitting: Orthopedic Surgery

## 2021-12-04 DIAGNOSIS — Z01818 Encounter for other preprocedural examination: Secondary | ICD-10-CM

## 2021-12-04 DIAGNOSIS — I2510 Atherosclerotic heart disease of native coronary artery without angina pectoris: Secondary | ICD-10-CM

## 2021-12-04 DIAGNOSIS — I714 Abdominal aortic aneurysm, without rupture, unspecified: Secondary | ICD-10-CM

## 2021-12-04 HISTORY — DX: Abdominal aortic aneurysm, without rupture, unspecified: I71.40

## 2021-12-04 NOTE — Patient Instructions (Addendum)
Your procedure is scheduled on:12-11-21 Monday Report to the Registration Desk on the 1st floor of the [facility].Then proceed to the 2nd floor Surgery Desk To find out your arrival time, please call [phone] between 1PM - 3PM on:12-08-21 Friday If your arrival time is 6:00 am, do not arrive prior to that time as the [facility] entrance doors do not open until 6:00 am.  REMEMBER: Instructions that are not followed completely may result in serious medical risk, up to and including death; or upon the discretion of your surgeon and anesthesiologist your surgery may need to be rescheduled.  Do not eat food after midnight the night before surgery.  No gum chewing, lozengers or hard candies.  You may however, drink CLEAR liquids up to 2 hours before you are scheduled to arrive for your surgery. Do not drink anything within 2 hours of your scheduled arrival time.  Clear liquids include: - water  - apple juice without pulp - gatorade (not RED colors) - black coffee or tea (Do NOT add milk or creamers to the coffee or tea) Do NOT drink anything that is not on this list.  In addition, your doctor has ordered for you to drink the provided  Ensure Pre-Surgery Clear Carbohydrate Drink  Drinking this carbohydrate drink up to two hours before surgery helps to reduce insulin resistance and improve patient outcomes. Please complete drinking 2 hours prior to scheduled arrival time.  TAKE THESE MEDICATIONS THE MORNING OF SURGERY WITH A SIP OF WATER: -buPROPion (WELLBUTRIN XL) -gabapentin (NEURONTIN)  -tamsulosin (FLOMAX)  -pantoprazole (PROTONIX)  Call Dr Clydell Hakim office today (12-04-21) about when you need to stop your 81 mg Aspirin  Use your Symbicort and your Albuterol Inhaler the day of surgery and bring your Albuterol Inhaler to the hospital  One week prior to surgery: Stop Anti-inflammatories (NSAIDS) such as Advil, Aleve, Ibuprofen, Motrin, Naproxen, Naprosyn and Aspirin based products  such as Excedrin, Goodys Powder, BC Powder.You may however, take Tylenol if needed for pain up until the day of surgery.  Stop ANY OVER THE COUNTER supplements/vitamins NOW (12-04-21) until after surgery (B Complex, multivitamin, Vitamin D)  No Alcohol for 24 hours before or after surgery.  No Smoking including e-cigarettes for 24 hours prior to surgery.  No chewable tobacco products for at least 6 hours prior to surgery.  No nicotine patches on the day of surgery.  Do not use any "recreational" drugs for at least a week prior to your surgery.  Please be advised that the combination of cocaine and anesthesia may have negative outcomes, up to and including death. If you test positive for cocaine, your surgery will be cancelled.  On the morning of surgery brush your teeth with toothpaste and water, you may rinse your mouth with mouthwash if you wish. Do not swallow any toothpaste or mouthwash.  Use CHG Soap as directed on instruction sheet.  Do not wear jewelry, make-up, hairpins, clips or nail polish.  Do not wear lotions, powders, or perfumes.   Do not shave body from the neck down 48 hours prior to surgery just in case you cut yourself which could leave a site for infection.  Also, freshly shaved skin may become irritated if using the CHG soap.  Contact lenses, hearing aids and dentures may not be worn into surgery.  Do not bring valuables to the hospital. [facility] is not responsible for any missing/lost belongings or valuables.   Notify your doctor if there is any change in your  medical condition (cold, fever, infection).  Wear comfortable clothing (specific to your surgery type) to the hospital.  After surgery, you can help prevent lung complications by doing breathing exercises.  Take deep breaths and cough every 1-2 hours. Your doctor may order a device called an Incentive Spirometer to help you take deep breaths. When coughing or sneezing, hold a pillow firmly against your  incision with both hands. This is called "splinting." Doing this helps protect your incision. It also decreases belly discomfort.  If you are being admitted to the hospital overnight, leave your suitcase in the car. After surgery it may be brought to your room.  If you are being discharged the day of surgery, you will not be allowed to drive home. You will need a responsible adult (18 years or older) to drive you home and stay with you that night.   If you are taking public transportation, you will need to have a responsible adult (18 years or older) with you. Please confirm with your physician that it is acceptable to use public transportation.   Please call the [facility] Dept. at [phone] if you have any questions about these instructions.  Surgery Visitation Policy:  Patients undergoing a surgery or procedure may have two family members or support persons with them as long as the person is not COVID-19 positive or experiencing its symptoms.   How to Use Chlorhexidine Before Surgery  Chlorhexidine gluconate (CHG) is a germ-killing (antiseptic) solution that is used to clean the skin. It can get rid of the bacteria that normally live on the skin and can keep them away for about 24 hours. To clean your skin with CHG, you may be given: A CHG solution to use in the shower or as part of a sponge bath. A prepackaged cloth that contains CHG. Cleaning your skin with CHG may help lower the risk for infection: While you are staying in the intensive care unit of the hospital. If you have a vascular access, such as a central line, to provide short-term or long-term access to your veins. If you have a catheter to drain urine from your bladder. If you are on a ventilator. A ventilator is a machine that helps you breathe by moving air in and out of your lungs. After surgery. What are the risks? Risks of using CHG include: A skin reaction. Hearing loss, if CHG gets in your ears  and you have a perforated eardrum. Eye injury, if CHG gets in your eyes and is not rinsed out. The CHG product catching fire. Make sure that you avoid smoking and flames after applying CHG to your skin. Do not use CHG: If you have a chlorhexidine allergy or have previously reacted to chlorhexidine. On babies younger than 34 months of age. How to use CHG solution Use CHG only as told by your health care provider, and follow the instructions on the label. Use the full amount of CHG as directed. Usually, this is one bottle. During a shower Follow these steps when using CHG solution during a shower (unless your health care provider gives you different instructions): Start the shower. Use your normal soap and shampoo to wash your face and hair. Turn off the shower or move out of the shower stream. Pour the CHG onto a clean washcloth. Do not use any type of brush or rough-edged sponge. Starting at your neck, lather your body down to your toes. Make sure you follow these instructions: If you will be having surgery,  pay special attention to the part of your body where you will be having surgery. Scrub this area for at least 1 minute. Do not use CHG on your head or face. If the solution gets into your ears or eyes, rinse them well with water. Avoid your genital area. Avoid any areas of skin that have broken skin, cuts, or scrapes. Scrub your back and under your arms. Make sure to wash skin folds. Let the lather sit on your skin for 1-2 minutes or as long as told by your health care provider. Thoroughly rinse your entire body in the shower. Make sure that all body creases and crevices are rinsed well. Dry off with a clean towel. Do not put any substances on your body afterward--such as powder, lotion, or perfume--unless you are told to do so by your health care provider. Only use lotions that are recommended by the manufacturer. Put on clean clothes or pajamas. 10. If it is the night before your  surgery, sleep in clean sheets.  Repeat above on the morning of surgery

## 2021-12-04 NOTE — Telephone Encounter (Signed)
-----   Message from Karen Kitchens, NP sent at 12/04/2021  3:58 PM EDT ----- Regarding: Request for pre-operative cardiac clearance Request for pre-operative cardiac clearance:  1. What type of surgery is being performed?  LEFT ARTHROSCOPY KNEE  2. When is this surgery scheduled?  12/11/2021  3. Type of clearance being requested (medical, pharmacy, both). BOTH   4. Are there any medications that need to be held prior to surgery? ASA  5. Practice name and name of physician performing surgery?  Performing surgeon: Dr. Skip Estimable, MD Requesting clearance: Honor Loh, FNP-C    6. Anesthesia type (none, local, MAC, general)? GENERAL  7. What is the office phone and fax number?   Phone: [phone] Fax: [phone]  ATTENTION: Unable to create telephone message as per your standard workflow. Directed by [facility] providers to send requests for cardiac clearance to this pool for appropriate distribution to provider covering pre-operative clearances.   Honor Loh, MSN, APRN, FNP-C, CEN [facility]  Peri-operative Services Nurse Practitioner Phone: [phone] 12/04/21 3:58 PM

## 2021-12-04 NOTE — Telephone Encounter (Signed)
   Name: Allen Tapia  DOB: 1955-05-29  MRN: [id number]  Primary Cardiologist: Buford Dresser, MD  Chart reviewed as part of pre-operative protocol coverage. Because of Caylon Saine Pralle's past medical history and time since last visit, he will require a follow-up telephone visit in order to better assess preoperative cardiovascular risk.  Pre-op covering staff: - Please schedule appointment and call patient to inform them. If patient already had an upcoming appointment within acceptable timeframe, please add "pre-op clearance" to the appointment notes so provider is aware. - Please contact requesting surgeon's office via preferred method (i.e, phone, fax) to inform them of need for appointment prior to surgery.  Per office protocol, can hold ASA x7 days prior to his procedure as long as no new symptoms.  Please restart when medically safe to do so.  Elgie Collard, PA-C  12/04/2021, 4:54 PM

## 2021-12-04 NOTE — Telephone Encounter (Signed)
Left a message for pt to call back for tele pre op appt 12/05/21 @ 3:20 as urgent add on per pre op provider. I have added pt to schedule, need to call back and confirm appt.

## 2021-12-05 ENCOUNTER — Telehealth: Payer: Self-pay | Admitting: *Deleted

## 2021-12-05 ENCOUNTER — Encounter
Admission: RE | Admit: 2021-12-05 | Discharge: 2021-12-05 | Disposition: A | Discharge status: Home / Self Care | Payer: Medicare Other | Source: Ambulatory Visit | Attending: Orthopedic Surgery | Admitting: Orthopedic Surgery

## 2021-12-05 ENCOUNTER — Ambulatory Visit: Payer: Medicare Other | Attending: Internal Medicine | Admitting: Nurse Practitioner

## 2021-12-05 ENCOUNTER — Encounter: Payer: Self-pay | Admitting: Orthopedic Surgery

## 2021-12-05 DIAGNOSIS — Z01810 Encounter for preprocedural cardiovascular examination: Secondary | ICD-10-CM | POA: Diagnosis present

## 2021-12-05 DIAGNOSIS — I2510 Atherosclerotic heart disease of native coronary artery without angina pectoris: Secondary | ICD-10-CM | POA: Diagnosis not present

## 2021-12-05 DIAGNOSIS — I714 Abdominal aortic aneurysm, without rupture, unspecified: Secondary | ICD-10-CM | POA: Diagnosis not present

## 2021-12-05 DIAGNOSIS — Z01818 Encounter for other preprocedural examination: Secondary | ICD-10-CM

## 2021-12-05 NOTE — Progress Notes (Signed)
Perioperative Services  Pre-Admission/Anesthesia Testing Clinical Review  Date: 12/08/21  Patient Demographics:  Name: Allen Tapia DOB:   12-08-1955 MRN:   [id number]  Planned Surgical Procedure(s):    Case: [id number] Date/Time: 12/11/21 1544   Procedure: ARTHROSCOPY KNEE (Left: Knee)   Anesthesia type: Choice   Pre-op diagnosis: INTERNAL DERANGEMENT OF LEFT KNEE.   Location: [facility] OR ROOM 01 / [facility]   Surgeons: Dereck Leep, MD   NOTE: Available PAT nursing documentation and vital signs have been reviewed. Clinical nursing staff has updated patient's PMH/PSHx, current medication list, and drug allergies/intolerances to ensure comprehensive history available to assist in medical decision making as it pertains to the aforementioned surgical procedure and anticipated anesthetic course.   Clinical Discussion:  Allen Tapia is a 66 y.o. male who is submitted for pre-surgical anesthesia review and clearance prior to him undergoing the above procedure. Patient is a Former Smoker (20 pack years; quit 08/2006). Pertinent PMH includes: CAD, ischemic heart disease, atypical chest pain, HLD, shortness of breath, asthma, multiple sclerosis, GERD with associated Barrett's esophagus (on daily PPI), OA, MS, BPH, depression  Patient is followed by cardiology Harrell Gave, MD). He was last seen in the cardiology clinic on 05/18/2020; notes reviewed.  At the time of his clinic visit, patient doing well overall from a cardiovascular perspective.  Patient with intermittent episodes of chest discomfort that were described as "twinges" when changing position from sitting to standing.  Pain not reported to be exertional, and felt to be musculoskeletal.  He denied any episodes of shortness of breath, PND, orthopnea, palpitations, significant peripheral edema, vertiginous symptoms, or presyncope/syncope.  Patient with a past medical history significant for cardiovascular  diagnoses.  Diagnostic LEFT heart catheterization performed on 07/14/2007 revealing a normal left ventricular systolic function with an EF of 65%.  There was multivessel CAD; 40% proximal RCA-1, 40% proximal RCA-2, 40% distal RCA, 20% RPL1, 30% LM, 20% mid LCx/1, 40% mid LCx/2, 20% OM 3-1, 20% OM 3-2, 20% proximal LAD, 40% mid LAD-1, 70% mid LAD-2, 40% mid LAD-3, 20% distal LAD, 40% D1, and 40% D2.  Given the nonobstructive nature of patient's noted coronary artery disease, the decision was made to defer intervention opting for medical management.  MRI imaging performed on 12/30/2020 revealed an incidental finding of an infrarenal saccular AAA measuring 2.2 cm.  Repeat CTA imaging of the abdomen pelvis on 01/05/2021 noted interval increase in size to 2.6 cm.  Patient ultimately underwent EVAR repair on 03/03/2021.  Blood well-controlled at 124/78 mmHg without the use of pharmacological intervention.  Patient is on rosuvastatin for his HLD diagnosis and further ASCVD prevention.  He is not diabetic.  He does not have an OSAH diagnosis.  Patient making efforts to remain active.  Functional capacity somewhat limited by arthritides, however patient still felt to be able to achieve at least 4 METS of activity without experiencing any type of angina/anginal equivalent symptoms.  No changes were made to his medication regimen.  Patient to follow-up with outpatient cardiology in 1 year or sooner if needed.  Patient scheduled to undergo an elective LEFT KNEE ARTHROSCOPY on 12/11/2021 with Dr. Skip Estimable, MD.  Given patient's past medical history significant cardiovascular diagnoses, presurgical cardiac clearance was sought by the PAT team.  Per cardiology, based on patient's past medical history and time since his last clinic visit, patient would be at an overall ACCEPTABLE risk for the planned procedure without further cardiovascular testing or intervention at this  time. This patient is on daily antiplatelet  therapy.  Patient has been instructed on recommendations from his cardiologist for holding his daily low-dose ASA for 7 days prior to his procedure with plans to restart as soon as postoperatively in his abdomen minimized by his primary attending surgeon.  Patient is aware that his last dose of ASA should be on 12/03/2021.    Patient denies previous perioperative complications with anesthesia in the past. In review of the available records, it is noted that patient underwent a general anesthetic course at [facility] ([facility]) in 02/2021 without documented complications.      09/14/2021    9:37 AM 04/12/2021    2:34 PM 03/04/2021    7:37 AM  Vitals with BMI  Height '6\' 2"'$  '6\' 2"'$    Weight 198 lbs 208 lbs   BMI 95.18 84.16   Systolic 606 301 601  Diastolic 78 78 93  Pulse 65 68 60    Providers/Specialists:   NOTE: Primary physician provider listed below. Patient may have been seen by APP or partner within same practice.   PROVIDER ROLE / SPECIALTY LAST OV  Hooten, Laurice Record, MD Orthopedics (Surgeon) 11/10/2021  Derinda Late, MD Primary Care Provider 09/19/2021  Buford Dresser, MD Cardiology 05/18/2020  Diamantina Monks, MD Neurology 08/15/2021   Allergies:  Patient has no known allergies.  Current Home Medications:   No current facility-administered medications for this encounter.    acetaminophen (TYLENOL) 500 MG tablet   albuterol (VENTOLIN HFA) 108 (90 Base) MCG/ACT inhaler   amphetamine-dextroamphetamine (ADDERALL XR) 25 MG 24 hr capsule   aspirin EC 81 MG tablet   b complex vitamins capsule   baclofen (LIORESAL) 20 MG tablet   buPROPion (WELLBUTRIN XL) 150 MG 24 hr tablet   celecoxib (CELEBREX) 200 MG capsule   fluorouracil (EFUDEX) 5 % cream   gabapentin (NEURONTIN) 300 MG capsule   mirabegron ER (MYRBETRIQ) 50 MG TB24 tablet   montelukast (SINGULAIR) 10 MG tablet   Multiple Vitamins-Minerals (MULTIVITAMIN WITH MINERALS) tablet   Ofatumumab (KESIMPTA) 20  MG/0.4ML SOAJ   pantoprazole (PROTONIX) 20 MG tablet   Polyvinyl Alcohol (LIQUID TEARS OP)   rosuvastatin (CRESTOR) 20 MG tablet   sodium chloride (OCEAN) 0.65 % SOLN nasal spray   Spacer/Aero-Holding Chambers (AEROCHAMBER MV) inhaler   SYMBICORT 160-4.5 MCG/ACT inhaler   tamsulosin (FLOMAX) 0.4 MG CAPS capsule   traZODone (DESYREL) 100 MG tablet   VITAMIN D PO   History:   Past Medical History:  Diagnosis Date   Anginal pain (HCC)    atypical chest pain cardiac cath performed at that time   Arthritis    Asthma    Atypical chest pain    Barrett's esophagus    Bilateral cataracts    BPH (benign prostatic hyperplasia)    Chicken pox    Chronic constipation    Coronary artery disease 07/14/2007   a.) LHC 07/14/2007: 40% pRCA-1, 40% pRCA-2, 40% dRCA, 20% RPL1, 30% LM, 20% mLCx-1, 40% mLCx-2, 20% OM3-1, 20% OM3-2, 20% pLAD, 40% mLAD-1, 70% mLAD-2, 40% mLAD-3, 30% dLAD, 40% D1, 40% D2 --> med mgmt.   Depression    Foreign body (FB) in soft tissue    Former tobacco use    GERD (gastroesophageal reflux disease)    History of 2019 novel coronavirus disease (COVID-19) 04/07/2019   History of kidney stones    Hyperlipidemia    Hyperplastic colon polyp    Infrarenal abdominal aortic aneurysm (AAA) without rupture ([place])  a.) MR lumbar spine 12/30/2020: infrarenal saccular AAA measuring 2.2 cm; b.) CTA A/P 01/05/2021: measured 2.6 cm; c.) s/p EVAR 03/03/2021   Ischemic heart disease 12/20/2010   Long term current use of immunosuppressive drug    a.) on ofatumumab for MS   MS (multiple sclerosis) ([place])    a.) on ofatumumab   Shingles    Tubular adenoma of colon 08/23/2016   Past Surgical History:  Procedure Laterality Date   ABDOMINAL AORTIC ENDOVASCULAR STENT GRAFT N/A 03/03/2021   Procedure: ABDOMINAL AORTIC ENDOVASCULAR STENT GRAFT;  Surgeon: Waynetta Sandy, MD;  Location: [facility];  Service: Vascular;  Laterality: N/A;   [place], BILATERAL  03/12/2019   COLONOSCOPY  02/05/2008   COLONOSCOPY WITH PROPOFOL N/A 08/23/2016   Procedure: COLONOSCOPY WITH PROPOFOL;  Surgeon: Lollie Sails, MD;  Location: [facility] ENDOSCOPY;  Service: Endoscopy;  Laterality: N/A;   COLONOSCOPY WITH PROPOFOL N/A 04/29/2020   Procedure: COLONOSCOPY WITH PROPOFOL;  Surgeon: Lesly Rubenstein, MD;  Location: [facility] ENDOSCOPY;  Service: Endoscopy;  Laterality: N/A;   CYSTOSCOPY WITH INSERTION OF UROLIFT N/A 05/20/2020   Procedure: CYSTOSCOPY WITH INSERTION OF UROLIFT;  Surgeon: Billey Co, MD;  Location: [facility] ORS;  Service: Urology;  Laterality: N/A;   ESOPHAGOGASTRODUODENOSCOPY (EGD) WITH PROPOFOL N/A 08/23/2016   Procedure: ESOPHAGOGASTRODUODENOSCOPY (EGD) WITH PROPOFOL;  Surgeon: Lollie Sails, MD;  Location: [facility] ENDOSCOPY;  Service: Endoscopy;  Laterality: N/A;   ESOPHAGOGASTRODUODENOSCOPY (EGD) WITH PROPOFOL N/A 04/29/2020   Procedure: ESOPHAGOGASTRODUODENOSCOPY (EGD) WITH PROPOFOL;  Surgeon: Lesly Rubenstein, MD;  Location: [facility] ENDOSCOPY;  Service: Endoscopy;  Laterality: N/A;   ESOPHAGOGASTRODUODENOSCOPY ENDOSCOPY  02/05/2008   EYE SURGERY     eye trauma  1970   piece of metal to eye removed in clinic   KNEE ARTHROPLASTY Right 10/05/2020   Procedure: COMPUTER ASSISTED TOTAL KNEE ARTHROPLASTY;  Surgeon: Dereck Leep, MD;  Location: [facility] ORS;  Service: Orthopedics;  Laterality: Right;   KNEE ARTHROSCOPY W/ PARTIAL MEDIAL MENISCECTOMY Right 01/02/2011   LEFT HEART CATH AND CORONARY ANGIOGRAPHY Left 07/14/2007   Procedure: LEFT HEART CATH AND CORONARY ANGIOGRAPHY; Location: [facility]; Surgeon: Doristine Bosworth, MD   SEPTOPLASTY     TONSILLECTOMY  02/05/2008   ULTRASOUND GUIDANCE FOR VASCULAR ACCESS Left 03/03/2021   Procedure: ULTRASOUND GUIDANCE FOR VASCULAR ACCESS, LEFT FEMORAL ARTERY;  Surgeon: Waynetta Sandy, MD;  Location: [facility];  Service: Vascular;  Laterality: Left;   WRIST FRACTURE SURGERY  Right    Family History  Problem Relation Age of Onset   Heart disease Mother    Glaucoma Mother    Irregular heart beat Mother    Cardiomyopathy Mother    Colon polyps Mother    Cataracts Mother    Hypertension Father    Heart disease Father    Cancer Father    Esophageal cancer Cousin    Social History   Tobacco Use   Smoking status: Former    Packs/day: 1.00    Years: 20.00    Total pack years: 20.00    Types: Cigarettes    Start date: 57    Quit date: 09/23/2006    Years since quitting: 15.2   Smokeless tobacco: Never  Vaping Use   Vaping Use: Never used  Substance Use Topics   Alcohol use: No   Drug use: No    Pertinent Clinical Results:  LABS: Labs reviewed: Acceptable for surgery.  ECG: Date: 12/08/2021 Time ECG obtained: 1110 AM Rate: 6 bpm Rhythm: normal sinus Axis (leads I and aVF): Normal Intervals: PR 168 ms. QRS 106 ms. QTc 422 ms. ST segment and T wave changes: No evidence of acute ST segment elevation or depression Comparison: Similar to previous tracing obtained on 09/23/2020   IMAGING / PROCEDURES: MR KNEE LEFT WO CONTRAST performed on 11/05/2021 Complete midsubstance ACL tear with pivot shift bone contusion pattern. Complex nondisplaced tear of the posterior horn and body of the medial meniscus. Fraying and suspected tear at the posterior root of the lateral meniscus. Mild-to-moderate tricompartment osteoarthritis, cartilage abnormalities as described above. Small joint effusion.   Small Baker's cyst.  CT ANGIO ABD/PEL W/ AND/OR W/O performed on 04/05/2021 Interval endograft repair of saccular infrarenal abdominal aortic aneurysm. Decreased excluded sac diameter without evidence of early or delayed enhancement. No acute abdominopelvic findings. Moderate burden of sigmoid diverticulosis. Additional incidental chronic and senescent findings  MR LUMBAR SPINE WO CONTRAST performed on 12/30/2020 Multilevel lumbar spondylosis,  most pronounced at the L3-L4 level where there is severe right and moderate left foraminal stenosis. No significant canal stenosis at any level. Approximately 2.2 cm rounded density abutting the infrarenal abdominal aorta, potentially representing a saccular aneurysm, however evaluation of the vascular structures is limited by technique. Further evaluation with CT angiogram of the abdomen and pelvis is recommended. Left-sided IVC, an anatomic variant.  STRESS ECHOCARDIOGRAM performed on 08/11/2007 LVEF greater than 55% Trivial MR, TR, and PR No AR No evidence of valvular stenosis G2DD Stress ECG results normal Normal stress echocardiogram  LEFT HEART CATHETERIZATION AND CORONARY ANGIOGRAPHY performed on 07/14/2007 EF 65% Right-sided dominance Severe single-vessel CAD Mid LAD 70% stenosis Evidence of multivessel nonobstructive CAD Proximal RCA 40% stenosis Distal RCA 40% stenosis RPL2 and RPL3 small LM 30% stenosis Mid LCx 20% stenosis Mid LCx 40% stenosis OM 3 (large) 20% stenosis Proximal LAD 20% stenosis Mid LAD 40% stenosis Distal LAD 20% stenosis  D1 40% stenosis D2 40% stenosis No intervention due to fairly atypical chest pain, stress test negative for signs of anterior ischemia, and good exercise tolerance; medical management recommended  MYOCARDIAL PERFUSION IMAGING STUDY (LEXISCAN) performed on 07/08/2007 LVEF 60% Regional wall motion reveals normal wall motion and myocardial thickening No evidence of stress-induced myocardial ischemia or arrhythmia  Impression and Plan:  Allen Tapia has been referred for pre-anesthesia review and clearance prior to him undergoing the planned anesthetic and procedural courses. Available labs, pertinent testing, and imaging results were personally reviewed by me. This patient has been appropriately cleared by cardiology with an overall ACCEPTABLE risk of significant perioperative cardiovascular complications.  Based on clinical  review performed today (12/08/21), barring any significant acute changes in the patient's overall condition, it is anticipated that he will be able to proceed with the planned surgical intervention. Any acute changes in clinical condition may necessitate his procedure being postponed and/or cancelled. Pre-surgical instructions were reviewed with the patient during his PAT appointment and questions were fielded by PAT clinical staff.  Honor Loh, MSN, APRN, FNP-C, CEN [facility]  Peri-operative Services Nurse Practitioner Phone: [phone] 12/08/21 11:43 AM  NOTE: This note has been prepared using Dragon dictation software. Despite my best ability to proofread, there is always the potential that unintentional transcriptional errors may still occur from this process.

## 2021-12-05 NOTE — Telephone Encounter (Signed)
Pt agreeable to plan of care for tele pre op appt today, urgent add on due to ASA hold time and procedure date. Med rec and consent are done.

## 2021-12-05 NOTE — Telephone Encounter (Signed)
Pt agreeable to plan of care for tele pre op appt today, urgent add on due to ASA hold time and procedure date. Med rec and consent are done.     Patient Consent for Virtual Visit        Allen Tapia has provided verbal consent on 12/05/2021 for a virtual visit (video or telephone).   CONSENT FOR VIRTUAL VISIT FOR:  Allen Tapia  By participating in this virtual visit I agree to the following:  I hereby voluntarily request, consent and authorize [facility] and its employed or contracted physicians, physician assistants, nurse practitioners or other licensed health care professionals (the Practitioner), to provide me with telemedicine health care services (the "Services") as deemed necessary by the treating Practitioner. I acknowledge and consent to receive the Services by the Practitioner via telemedicine. I understand that the telemedicine visit will involve communicating with the Practitioner through live audiovisual communication technology and the disclosure of certain medical information by electronic transmission. I acknowledge that I have been given the opportunity to request an in-person assessment or other available alternative prior to the telemedicine visit and am voluntarily participating in the telemedicine visit.  I understand that I have the right to withhold or withdraw my consent to the use of telemedicine in the course of my care at any time, without affecting my right to future care or treatment, and that the Practitioner or I may terminate the telemedicine visit at any time. I understand that I have the right to inspect all information obtained and/or recorded in the course of the telemedicine visit and may receive copies of available information for a reasonable fee.  I understand that some of the potential risks of receiving the Services via telemedicine include:  Delay or interruption in medical evaluation due to technological equipment failure or  disruption; Information transmitted may not be sufficient (e.g. poor resolution of images) to allow for appropriate medical decision making by the Practitioner; and/or  In rare instances, security protocols could fail, causing a breach of personal health information.  Furthermore, I acknowledge that it is my responsibility to provide information about my medical history, conditions and care that is complete and accurate to the best of my ability. I acknowledge that Practitioner's advice, recommendations, and/or decision may be based on factors not within their control, such as incomplete or inaccurate data provided by me or distortions of diagnostic images or specimens that may result from electronic transmissions. I understand that the practice of medicine is not an exact science and that Practitioner makes no warranties or guarantees regarding treatment outcomes. I acknowledge that a copy of this consent can be made available to me via my patient portal ([facility]), or I can request a printed copy by calling the office of [facility].    I understand that my insurance will be billed for this visit.   I have read or had this consent read to me. I understand the contents of this consent, which adequately explains the benefits and risks of the Services being provided via telemedicine.  I have been provided ample opportunity to ask questions regarding this consent and the Services and have had my questions answered to my satisfaction. I give my informed consent for the services to be provided through the use of telemedicine in my medical care

## 2021-12-05 NOTE — Progress Notes (Signed)
Virtual Visit via Telephone Note   Because of Allen Tapia's co-morbid illnesses, he is at least at moderate risk for complications without adequate follow up.  This format is felt to be most appropriate for this patient at this time.  The patient did not have access to video technology/had technical difficulties with video requiring transitioning to audio format only (telephone).  All issues noted in this document were discussed and addressed.  No physical exam could be performed with this format.  Please refer to the patient's chart for his consent to telehealth for [facility].  Evaluation Performed:  Preoperative cardiovascular risk assessment _____________   Date:  12/05/2021   Patient ID:  Allen Tapia, DOB 03-Mar-1956, MRN [id number] Patient Location:  Home Provider location:   Office  Primary Care Provider:  Derinda Late, MD Primary Cardiologist:  Buford Dresser, MD  Chief Complaint / Patient Profile   66 y.o. y/o male with a h/o HLD, tobacco abuse, CAD with diffuse disease, 70% stenosis LAD by cardiac cath 2009 at [facility] who is pending left total knee arthroscopy and presents today for telephonic preoperative cardiovascular risk assessment.  Past Medical History    Past Medical History:  Diagnosis Date   AAA (abdominal aortic aneurysm) (HCC)    Anginal pain ([place])    atypical chest pain cardiac cath performed at that time   Arthritis    Asthma    Atypical chest pain    Barrett's esophagus    Bilateral cataracts    Chicken pox    Chronic constipation    Coronary artery disease 12/20/2010   Depression    Foreign body (FB) in soft tissue    Former tobacco use    GERD (gastroesophageal reflux disease)    History of 2019 novel coronavirus disease (COVID-19) 04/07/2019   History of kidney stones    Hyperlipidemia    Hyperplastic colon polyp    Ischemic heart disease 12/20/2010   Kidney stone    MS (multiple sclerosis) ([place])    Shingles     Tubular adenoma of colon 08/23/2016   Past Surgical History:  Procedure Laterality Date   ABDOMINAL AORTIC ENDOVASCULAR STENT GRAFT N/A 03/03/2021   Procedure: ABDOMINAL AORTIC ENDOVASCULAR STENT GRAFT;  Surgeon: Waynetta Sandy, MD;  Location: [place];  Service: Vascular;  Laterality: N/A;   [place] Right 01/02/2011   partial meniscectomy    CARDIAC CATHETERIZATION  2010   CATARACT EXTRACTION, BILATERAL  03/12/2019   COLONOSCOPY  02/05/2008   COLONOSCOPY WITH PROPOFOL N/A 08/23/2016   Procedure: COLONOSCOPY WITH PROPOFOL;  Surgeon: Lollie Sails, MD;  [facility] ENDOSCOPY;  Service: Endoscopy;  Laterality: N/A;   COLONOSCOPY WITH PROPOFOL N/A 04/29/2020   Procedure: COLONOSCOPY WITH PROPOFOL;  Surgeon: Lesly Rubenstein, MD;  Location: [facility] ENDOSCOPY;  Service: Endoscopy;  Laterality: N/A;   CYSTOSCOPY WITH INSERTION OF UROLIFT N/A 05/20/2020   Procedure: CYSTOSCOPY WITH INSERTION OF UROLIFT;  Surgeon: Billey Co, MD;  Location: [facility] ORS;  Service: Urology;  Laterality: N/A;   ESOPHAGOGASTRODUODENOSCOPY (EGD) WITH PROPOFOL N/A 08/23/2016   Procedure: ESOPHAGOGASTRODUODENOSCOPY (EGD) WITH PROPOFOL;  Surgeon: Lollie Sails, MD;  [facility] END[facility]COPY;  Service: Endoscopy;  Laterality: N/A;   ESOPHAGOGASTRODUODENOSCOPY (EGD) WITH PROPOFOL N/A 04/29/2020   Procedure: ESOPHAGOGASTRODUODENOSCOPY (EGD) WITH PROPOFOL;  Surgeon: Lesly Rubenstein, MD;  Location: [facility] ENDOSCOPY;  Service: Endoscopy;  Laterality: N/A;   ESOPHAGOGASTRODUODENOSCOPY ENDOSCOPY  02/05/2008   EYE  SURGERY     eye trauma  1970   piece of metal to eye removed in clinic   FRACTURE SURGERY Right    wrist    FRACTURE SURGERY Right    knee; partial meniscectomy   KNEE ARTHROPLASTY Right 10/05/2020   Procedure: COMPUTER ASSISTED TOTAL KNEE ARTHROPLASTY;  Surgeon: Dereck Leep, MD;  Location: [facility] ORS;  Service: Orthopedics;  Laterality:  Right;   SEPTOPLASTY     TONSILLECTOMY  02/05/2008   ULTRASOUND GUIDANCE FOR VASCULAR ACCESS Left 03/03/2021   Procedure: ULTRASOUND GUIDANCE FOR VASCULAR ACCESS, LEFT FEMORAL ARTERY;  Surgeon: Waynetta Sandy, MD;  Location: [place];  Service: Vascular;  Laterality: Left;    Allergies  No Known Allergies  History of Present Illness    Allen Tapia is a 66 y.o. male who presents via audio/video conferencing for a telehealth visit today.  Pt was last seen in cardiology clinic on 02/23/21 by Nicholes Rough, PA.  At that time [name] was doing well.  The patient is now pending procedure as outlined above. Since his last visit, he denies chest pain, shortness of breath, lower extremity edema, fatigue, melena, hematuria, hemoptysis, diaphoresis, weakness, presyncope, syncope, orthopnea, and PND. Occasional palpitation that lasts a few seconds 1-2 times per week, no associated symptoms.   Home Medications    Prior to Admission medications   Medication Sig Start Date End Date Taking? Authorizing Provider  acetaminophen (TYLENOL) 500 MG tablet Take 500 mg by mouth every 6 (six) hours as needed for moderate pain.    [provider]  albuterol (VENTOLIN HFA) 108 (90 Base) MCG/ACT inhaler Inhale 2 puffs into the lungs every 6 (six) hours as needed for wheezing or shortness of breath. 09/12/21   Martyn Ehrich, NP  amphetamine-dextroamphetamine (ADDERALL XR) 25 MG 24 hr capsule Take 25 mg by mouth daily as needed (focus).    [provider]  aspirin EC 81 MG tablet Take 81 mg by mouth daily. Swallow whole.    [provider]  b complex vitamins capsule Take 1 capsule by mouth at bedtime.    [provider]  baclofen (LIORESAL) 20 MG tablet Take 20 mg by mouth 3 (three) times daily.    [provider]  buPROPion (WELLBUTRIN XL) 150 MG 24 hr tablet Take 150 mg by mouth every morning. 12/16/18   [provider]  celecoxib (CELEBREX)  200 MG capsule Take 200 mg by mouth 2 (two) times daily. 02/26/21   [provider]  fluorouracil (EFUDEX) 5 % cream Apply 1 Dose topically as needed. 06/16/21   [provider]  gabapentin (NEURONTIN) 300 MG capsule Take 1 capsule by mouth 3 (three) times daily. 05/15/21   [provider]  mirabegron ER (MYRBETRIQ) 50 MG TB24 tablet Take 1 tablet (50 mg total) by mouth daily. Patient taking differently: Take 50 mg by mouth at bedtime. 10/16/21   Billey Co, MD  montelukast (SINGULAIR) 10 MG tablet TAKE 1 TABLET AT BEDTIME Patient taking differently: Take 10 mg by mouth at bedtime. 02/24/21   Martyn Ehrich, NP  Multiple Vitamins-Minerals (MULTIVITAMIN WITH MINERALS) tablet Take 1 tablet by mouth daily.    [provider]  Ofatumumab (KESIMPTA) 20 MG/0.4ML SOAJ Inject 1 Dose into the skin every 30 (thirty) days. 1st day of every month    [provider]  pantoprazole (PROTONIX) 20 MG tablet Take 1 tablet by mouth at bedtime. 09/11/21   [provider]  Polyvinyl Alcohol (LIQUID TEARS OP) Place 1 drop into both eyes 2 (two) times daily.    [provider]  rosuvastatin (CRESTOR) 20 MG tablet Take 1 tablet (20 mg total) by mouth at bedtime. 05/04/21   Buford Dresser, MD  sodium chloride (OCEAN) 0.65 % SOLN nasal spray Place 1 spray into both nostrils 4 (four) times daily as needed for congestion.    [provider]  Spacer/Aero-Holding Chambers (AEROCHAMBER MV) inhaler Use as instructed 06/11/18   Icard, Bradley L, DO  SYMBICORT 160-4.5 MCG/ACT inhaler USE 2 INHALATIONS IN THE MORNING AND AT BEDTIME Patient taking differently: 1-2 puffs in the morning and at bedtime. 09/12/21   Garner Nash, DO  tamsulosin (FLOMAX) 0.4 MG CAPS capsule TAKE 1 CAPSULE DAILY Patient taking differently: Take 0.4 mg by mouth daily after breakfast. 11/21/21   Billey Co, MD  traZODone (DESYREL) 100 MG tablet Take 100 mg by mouth at  bedtime.    [provider]  VITAMIN D PO Take 1 capsule by mouth daily.    [provider]    Physical Exam    Vital Signs:  SENDER RUEB does not have vital signs available for review today.  Given telephonic nature of communication, physical exam is limited. AAOx3. NAD. Normal affect.  Speech and respirations are unlabored.  Accessory Clinical Findings    None  Assessment & Plan    1.  Preoperative Cardiovascular Risk Assessment: The patient is doing well from a cardiac perspective. Therefore, based on ACC/AHA guidelines, the patient would be at acceptable risk for the planned procedure without further cardiovascular testing. The patient was advised that if he develops new symptoms prior to surgery to contact our office to arrange for a follow-up visit, and he verbalized understanding. According to the Revised Cardiac Risk Index (RCRI), his Perioperative Risk of Major Cardiac Event is (%): 0.4. His Functional Capacity in METs is: 7.59 according to the Duke Activity Status Index (DASI).  Per office protocol, can hold ASA x7 days prior to his procedure as long as no new symptoms.  Please restart when medically safe to do so.  A copy of this note will be routed to requesting surgeon.  Time:   Today, I have spent 10 minutes with the patient with telehealth technology discussing medical history, symptoms, and management plan.     Emmaline Life, NP-C     12/05/2021, 3:28 PM [address] Office [phone] Fax [phone]

## 2021-12-08 ENCOUNTER — Encounter: Payer: Self-pay | Admitting: Orthopedic Surgery

## 2021-12-10 MED ORDER — CHLORHEXIDINE GLUCONATE 0.12 % MT SOLN: 15.0000 mL | Freq: Once | OROMUCOSAL | Status: AC

## 2021-12-10 MED ORDER — ORAL CARE MOUTH RINSE: 15.0000 mL | Freq: Once | OROMUCOSAL | Status: AC

## 2021-12-10 MED ORDER — CEFAZOLIN SODIUM-DEXTROSE 2-4 GM/100ML-% IV SOLN
2.0000 g | INTRAVENOUS | Status: AC
  Administered 2021-12-11: 2 g via INTRAVENOUS

## 2021-12-10 MED ORDER — CELECOXIB 200 MG PO CAPS: 400.0000 mg | ORAL_CAPSULE | Freq: Once | ORAL | Status: AC

## 2021-12-10 MED ORDER — LACTATED RINGERS IV SOLN: INTRAVENOUS | Status: DC

## 2021-12-10 NOTE — H&P (Signed)
ORTHOPAEDIC HISTORY & PHYSICAL Allen Tapia, [place] - 12/07/2021 3:45 PM EDT Formatting of this note is different from the original. Allen Tapia Chief Complaint:   Chief Complaint  Patient presents with  Knee Pain  H & P LEFT KNEE   History of Present Illness:   Allen Tapia is a 66 y.o. male that presents to clinic today for his preoperative history and evaluation. Patient presents unaccompanied. The patient is scheduled to undergo a left knee arthroscopy on 12/11/21 by Dr. Marry Tapia. His pain began on October 25, 2021 when he felt a pop in the left knee while he was pulling on a [place]. MRI of the knee was obtained which showed complete tear of the ACL as well as tears of the medial and lateral menisci. Mild degenerative changes were also noticed tricompartmentally. Pain has improved somewhat since his initial injury but he continues to have a feeling of instability. The remaining pain is located along the posterior aspect of the knee. He describes his pain as worse with lateral movements and pivoting. He reports associated feeling of instability with some giving way. He denies associated numbness or tingling.   The patient's symptoms have progressed to the point that they decrease his quality of life. The patient has previously undergone conservative treatment including bracing and activity modification without significant relief of symptoms.  Patient has no history of lumbar surgery but does have history of MS. No history of DVT. Has received cardiac clearance.   Past Medical, Surgical, Family, Social History, Allergies, Medications:   Past Medical History:  Past Medical History:  Diagnosis Date  Atypical chest pain  Barrett's esophagus  Bilateral cataracts  CAD (coronary artery disease) 12/20/2010  Chronic constipation  Coronary artery disease  FH: ischemic heart disease 12/20/2010  Foreign body 12/20/2010  Former tobacco use  GERD  (gastroesophageal reflux disease)  History of chickenpox  Hyperlipidemia  Hyperplastic colon polyp 08/23/2016  Multiple sclerosis (CMS-HCC)  Shingles  Tubular adenoma of colon 08/23/2016   Past Surgical History:  Past Surgical History:  Procedure Laterality Date  [place]  UPPER GASTROINTESTINAL ENDOSCOPY 02/05/2008  KNEE ARTHROSCOPY Right 01/02/2011  Right knee arthroscopic, partial meniscectomy.  COLONOSCOPY 09/18/2012  PHX Colon polyps Repeat 09/18/17  UPPER GASTROINTESTINAL ENDOSCOPY 09/18/2012  +Barrett's Esophagus Repeat 09/18/13  UPPER GASTROINTESTINAL ENDOSCOPY 08/31/2013  COLONOSCOPY 08/23/2016  Tubular adenoma of colon/Hyperplastic colon polyp/Repeat 39yr/MUS  EGD 08/23/2016  Gastritis/Negative for Barrett's this bx/Repeat 227yrMUS  EXTRACTION CATARACT INTRACAPSULAR W/INSERTION INTRAOCULAR PROSTHESIS Left 02/10/2019  Procedure: LENSX/ Panoptix-----EXTRACTION CATARACT EXTRACAPSULAR ECCE WITH INSERTION INTRAOCULAR PROSTHESIS; Surgeon: TsGolden CircleMD; [facility]; Laterality: Left;  EXTRACTION CATARACT INTRACAPSULAR W/INSERTION INTRAOCULAR PROSTHESIS Right 03/10/2019  Procedure: LENSX/ Panoptix---EXTRACTION CATARACT EXTRACAPSULAR ECCE WITH INSERTION INTRAOCULAR PROSTHESIS; Surgeon: TsGolden CircleMD; [facility]; Laterality: Right;  COLONOSCOPY 04/29/2020  Tubular adenoma/PHx CP/Repeat 5y69yrTL  EGD 04/29/2020  Normal EGD/Repeat as needed/CTL  Right total knee arthroplasty using computer-assisted navigation 10/05/2020  Dr HooMarry Guanndovascular graft repair of infrarenal abdominal aortic aneurysm 03/03/2021  Dr. BraServando SnareARDIAC CATHETERIZATION  COLONOSCOPY  02/05/2008  COLONOSCOPY  02/12/2008  EYE TRAUMA  1970 piece of metal to eye - removed in clinic  FRACTURE SURGERY Right  Right wrist surgery for fracture.  SEPTOPLASTY  TONSILLECTOMY    Current Medications:  Current Outpatient Medications  Medication Sig Dispense Refill  acetaminophen (TYLENOL) 500 MG tablet Take 1,000 mg by mouth as  needed for Pain.  aspirin 81 mg tablet Take 1 tablet by mouth once daily  b complex vitamins capsule Take 1 capsule by mouth once daily  baclofen (LIORESAL) 20 MG tablet TAKE 1 TABLET THREE TIMES A DAY 270 tablet 3  buPROPion (WELLBUTRIN XL) 150 MG XL tablet TAKE 1 TABLET DAILY 90 tablet 3  celecoxib (CELEBREX) 200 MG capsule TAKE 1 CAPSULE TWICE A DAY 180 capsule 3  cholecalciferol (VITAMIN D3) 1000 unit tablet Take 3,000 Units by mouth once daily  dextroamphetamine-amphetamine (ADDERALL XR) 25 MG XR capsule Take 1 capsule (25 mg total) by mouth every morning 90 capsule 0  gabapentin (NEURONTIN) 300 MG capsule TAKE 1 CAPSULE THREE TIMES A DAY 270 capsule 3  KESIMPTA PEN 20 mg/0.4 mL PnIj Inject 20 mg subcutaneously every 28 (twenty-eight) days 1.2 mL 3  mirabegron (MYRBETRIQ) 50 mg ER tablet Take 50 mg by mouth once daily  multivitamin with minerals tablet Take 1 tablet by mouth once daily  pantoprazole (PROTONIX) 20 MG DR tablet TAKE 1 TABLET DAILY 90 tablet 3  tamsulosin (FLOMAX) 0.4 mg capsule Take 0.4 mg by mouth once daily  traZODone (DESYREL) 100 MG tablet TAKE 2 TABLETS AT BEDTIME 180 tablet 3  albuterol 90 mcg/actuation inhaler Inhale 2 inhalations into the lungs every 4 (four) hours as needed 3 each 3  budesonide-formoteroL (SYMBICORT) 160-4.5 mcg/actuation inhaler Inhale 2 inhalations into the lungs 2 (two) times daily  HYDROcodone-acetaminophen (NORCO) 5-325 mg tablet Take 1 tablet by mouth every 6 (six) hours as needed for Pain for up to 20 doses 20 tablet 0  ibuprofen (ADVIL,MOTRIN) 800 MG tablet Take 800 mg by mouth every 6 (six) hours as needed for Pain.  montelukast (SINGULAIR) 10 mg tablet Take 10 mg by mouth once daily  rosuvastatin (CRESTOR) 20 MG tablet Take 20 mg by mouth once daily   No current facility-administered  medications for this visit.   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  Tobacco Use  Smoking status: Former  Packs/day: 1.00  Years: 20.00  Pack years: 20.00  Types: Cigarettes  Quit date: 08/25/1998  Years since quitting: 23.3  Smokeless tobacco: Never  Vaping Use  Vaping Use: Never used  Substance and Sexual Activity  Alcohol use: No  Alcohol/week: 0.0 standard drinks  Drug use: No  Sexual activity: Defer  Partners: Female   Family History:  Family History  Problem Relation Age of Onset  Glaucoma Mother  Irregular Heart Beat (Arrhythmia) Mother  pacemaker  Cardiomyopathy (Abnormal function of the heart muscle) Mother  Colon polyps Mother  Cataracts Mother  Cancer Father  High blood pressure (Hypertension) Father  Esophageal cancer Cousin  Macular degeneration Neg Hx  Blindness Neg Hx  Diabetes Neg Hx  Anesthesia problems Neg Hx   Review of Systems:   A 10+ ROS was performed, reviewed, and the pertinent orthopaedic findings are documented in the HPI.   Physical Examination:   BP 124/80 (BP Location: Left upper arm, Patient Position: Sitting, BP Cuff Size: Adult)  Ht 190.5 cm ('6\' 3"'$ )  Wt 92.7 kg (204 lb 6.4 oz)  BMI 25.55 kg/m   Patient is a well-developed, well-nourished male in no acute distress. Patient has normal mood and  affect. Patient is alert and oriented to person, place, and time.   HEENT: Atraumatic, normocephalic. Pupils equal and reactive to light. Extraocular motion intact. Noninjected sclera.  Cardiovascular: Regular rate and rhythm, with no murmurs, rubs, or gallops. Distal pulses palpable.  Respiratory: Lungs clear to auscultation bilaterally.    Left Knee:  Soft tissue swelling: mild Effusion: none Erythema: none Crepitance: none Tenderness: Posterior Alignment: normal Mediolateral laxity: stable Anterior drawer test:positive Lachman`s test: positive McMurray`s test: positive Atrophy: No significant atrophy.  Quadriceps tone was fair to good. Range of Motion: 4/0/140  Sensation intact over the saphenous, lateral sural cutaneous, decreased over the superficial fibular and deep fibular nerve distributions.  Tests Performed/Reviewed:  X-rays  MRI OF THE LEFT KNEE WITHOUT CONTRAST   TECHNIQUE:  Multiplanar, multisequence MR imaging of the knee was performed. No  intravenous contrast was administered.   COMPARISON: None Available.   FINDINGS:  MENISCI   Medial: Complex nondisplaced tear of the posterior horn and body of  the medial meniscus.   Lateral: There is fraying and suspected tear at the posterior root  of the lateral meniscus.   LIGAMENTS   Cruciates: Complete midsubstance ACL tear. Intact PCL.   Collaterals: Medial collateral ligament is intact. Lateral  collateral ligament complex is intact.   CARTILAGE   Patellofemoral: There is chondral fibrillation and fissuring of the  lateral patellar facet with mild subchondral marrow edema.   Medial: Mild partial-thickness cartilage loss with focal  intermediate grade chondrosis involving the inner weight-bearing  medial femoral condyle.   Lateral: Mild partial-thickness cartilage loss. Focal mild  osteochondral impaction injury at the sulcus terminalis.   JOINT: Small joint effusion.   POPLITEAL FOSSA: SmallBaker's cyst.   EXTENSOR MECHANISM: Intact quadriceps tendon. Intact patellar  tendon.   BONES: Pivot shift bony contusion pattern in the lateral femoral  condyle and posterolateral tibial plateau. Additional contusion in  the posteromedial tibial plateau without evidence of a ramp lesion.   Other: No additional findings.   IMPRESSION:   Complete midsubstance ACL tear with pivot shift bone contusion  pattern.   Complex nondisplaced tear of the posterior horn and body of the  medial meniscus.   Fraying and suspected tear at the posterior root of the lateral  meniscus.   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   Small joint effusion. Small Baker's cyst.   Electronically Signed  By: Maurine Simmering M.D.  On: 11/06/2021 20:04   Impression:  No diagnosis found.  Plan:   The patient has evidence of meniscal pathology and ACL tear as well as mild degenerative changes. It was explained to the patient that the condition is progressive in nature. Having failed conservative treatment, the patient has elected to proceed with a knee arthroscopy. Given that the patient does have degenerative changes throughout the knee, it was decided to proceed without repairing the ACL. Instead, patient will undergo rigorous physical therapy following surgery to improve stability of the knee. The patient will undergo a knee arthroscopy with Dr. Marry Tapia. The risks of surgery, including blood clot and infection, were discussed with the patient. The importance of postoperative physical therapy was discussed with the patient. The patient elects to proceed with surgery. The patient is instructed to call the hospital the day before surgery to learn of the proper arrival time. Order placed for physical therapy.  Contact our office with any questions or concerns. Follow up as indicated, or sooner should any new problems arise, if conditions worsen, or if  they are otherwise concerned.   Allen Tapia, [facility] [place], [place] [address] Phone: [phone]  This note was generated in part with voice recognition software and I apologize for any typographical errors that were not detected and corrected.  Electronically signed by Allen Fudge, PA at 12/07/2021 6:35 PM EDT

## 2021-12-11 ENCOUNTER — Other Ambulatory Visit: Payer: Self-pay

## 2021-12-11 ENCOUNTER — Encounter: Payer: Self-pay | Admitting: Orthopedic Surgery

## 2021-12-11 ENCOUNTER — Encounter
Admission: RE | Disposition: A | Discharge status: Home / Self Care | Payer: Self-pay | Source: Ambulatory Visit | Attending: Orthopedic Surgery

## 2021-12-11 ENCOUNTER — Ambulatory Visit: Payer: Medicare Other | Admitting: Urgent Care

## 2021-12-11 ENCOUNTER — Ambulatory Visit
Admission: RE | Admit: 2021-12-11 | Discharge: 2021-12-11 | Disposition: A | Discharge status: Home / Self Care | Payer: Medicare Other | Source: Ambulatory Visit | Attending: Orthopedic Surgery | Admitting: Orthopedic Surgery

## 2021-12-11 DIAGNOSIS — S83512A Sprain of anterior cruciate ligament of left knee, initial encounter: Secondary | ICD-10-CM | POA: Insufficient documentation

## 2021-12-11 DIAGNOSIS — J45909 Unspecified asthma, uncomplicated: Secondary | ICD-10-CM | POA: Insufficient documentation

## 2021-12-11 DIAGNOSIS — M2392 Unspecified internal derangement of left knee: Secondary | ICD-10-CM | POA: Diagnosis present

## 2021-12-11 DIAGNOSIS — S83282A Other tear of lateral meniscus, current injury, left knee, initial encounter: Secondary | ICD-10-CM | POA: Diagnosis not present

## 2021-12-11 DIAGNOSIS — I2510 Atherosclerotic heart disease of native coronary artery without angina pectoris: Secondary | ICD-10-CM | POA: Diagnosis not present

## 2021-12-11 DIAGNOSIS — K219 Gastro-esophageal reflux disease without esophagitis: Secondary | ICD-10-CM | POA: Insufficient documentation

## 2021-12-11 DIAGNOSIS — X58XXXA Exposure to other specified factors, initial encounter: Secondary | ICD-10-CM | POA: Diagnosis not present

## 2021-12-11 DIAGNOSIS — M94262 Chondromalacia, left knee: Secondary | ICD-10-CM | POA: Diagnosis not present

## 2021-12-11 DIAGNOSIS — Z9889 Other specified postprocedural states: Secondary | ICD-10-CM

## 2021-12-11 DIAGNOSIS — Z87891 Personal history of nicotine dependence: Secondary | ICD-10-CM | POA: Diagnosis not present

## 2021-12-11 DIAGNOSIS — G4730 Sleep apnea, unspecified: Secondary | ICD-10-CM | POA: Insufficient documentation

## 2021-12-11 HISTORY — DX: Infrarenal abdominal aortic aneurysm, without rupture: I71.43

## 2021-12-11 HISTORY — DX: Long term (current) use of unspecified immunomodulators and immunosuppressants: Z79.60

## 2021-12-11 HISTORY — PX: KNEE ARTHROSCOPY: SHX127

## 2021-12-11 HISTORY — DX: Benign prostatic hyperplasia without lower urinary tract symptoms: N40.0

## 2021-12-11 HISTORY — DX: Other long term (current) drug therapy: Z79.899

## 2021-12-11 LAB — GLUCOSE, CAPILLARY: Glucose-Capillary: 151 mg/dL — ABNORMAL HIGH (ref 70–99)

## 2021-12-11 SURGERY — ARTHROSCOPY, KNEE
Anesthesia: General | Site: Knee | Laterality: Left

## 2021-12-11 MED ORDER — OXYCODONE HCL 5 MG PO TABS: 5.0000 mg | ORAL_TABLET | Freq: Once | ORAL | Status: DC | PRN

## 2021-12-11 MED ORDER — EPHEDRINE 5 MG/ML INJ
INTRAVENOUS | Status: AC
  Filled 2021-12-11: qty 5

## 2021-12-11 MED ORDER — PHENYLEPHRINE HCL (PRESSORS) 10 MG/ML IV SOLN
INTRAVENOUS | Status: DC | PRN
  Administered 2021-12-11 (×5): 80 ug via INTRAVENOUS

## 2021-12-11 MED ORDER — ACETAMINOPHEN 10 MG/ML IV SOLN
INTRAVENOUS | Status: DC | PRN
  Administered 2021-12-11: 1000 mg via INTRAVENOUS

## 2021-12-11 MED ORDER — MIDAZOLAM HCL 2 MG/2ML IJ SOLN
INTRAMUSCULAR | Status: DC | PRN
  Administered 2021-12-11: 2 mg via INTRAVENOUS

## 2021-12-11 MED ORDER — PHENYLEPHRINE HCL (PRESSORS) 10 MG/ML IV SOLN
INTRAVENOUS | Status: AC
  Filled 2021-12-11: qty 1

## 2021-12-11 MED ORDER — ONDANSETRON HCL 4 MG/2ML IJ SOLN
INTRAMUSCULAR | Status: AC
  Filled 2021-12-11: qty 2

## 2021-12-11 MED ORDER — GLYCOPYRROLATE 0.2 MG/ML IJ SOLN
INTRAMUSCULAR | Status: DC | PRN
  Administered 2021-12-11: .2 mg via INTRAVENOUS

## 2021-12-11 MED ORDER — EPHEDRINE SULFATE (PRESSORS) 50 MG/ML IJ SOLN
INTRAMUSCULAR | Status: DC | PRN
  Administered 2021-12-11: 5 mg via INTRAVENOUS
  Administered 2021-12-11: 10 mg via INTRAVENOUS

## 2021-12-11 MED ORDER — PROPOFOL 10 MG/ML IV BOLUS
INTRAVENOUS | Status: AC
  Filled 2021-12-11: qty 20

## 2021-12-11 MED ORDER — LIDOCAINE HCL (PF) 2 % IJ SOLN
INTRAMUSCULAR | Status: AC
  Filled 2021-12-11: qty 5

## 2021-12-11 MED ORDER — ONDANSETRON HCL 4 MG PO TABS: 4.0000 mg | ORAL_TABLET | Freq: Four times a day (QID) | ORAL | Status: DC | PRN

## 2021-12-11 MED ORDER — METOCLOPRAMIDE HCL 5 MG/ML IJ SOLN: 5.0000 mg | Freq: Three times a day (TID) | INTRAMUSCULAR | Status: DC | PRN

## 2021-12-11 MED ORDER — ONDANSETRON HCL 4 MG/2ML IJ SOLN: 4.0000 mg | Freq: Four times a day (QID) | INTRAMUSCULAR | Status: DC | PRN

## 2021-12-11 MED ORDER — PHENYLEPHRINE 80 MCG/ML (10ML) SYRINGE FOR IV PUSH (FOR BLOOD PRESSURE SUPPORT)
PREFILLED_SYRINGE | INTRAVENOUS | Status: AC
  Filled 2021-12-11: qty 10

## 2021-12-11 MED ORDER — MORPHINE SULFATE (PF) 4 MG/ML IV SOLN
INTRAVENOUS | Status: AC
  Filled 2021-12-11: qty 1

## 2021-12-11 MED ORDER — CEFAZOLIN SODIUM-DEXTROSE 2-4 GM/100ML-% IV SOLN
INTRAVENOUS | Status: AC
  Filled 2021-12-11: qty 100

## 2021-12-11 MED ORDER — MORPHINE SULFATE (PF) 2 MG/ML IV SOLN: 0.5000 mg | INTRAVENOUS | Status: DC | PRN

## 2021-12-11 MED ORDER — MIDAZOLAM HCL 2 MG/2ML IJ SOLN
INTRAMUSCULAR | Status: AC
  Filled 2021-12-11: qty 2

## 2021-12-11 MED ORDER — BUPIVACAINE-EPINEPHRINE (PF) 0.25% -1:200000 IJ SOLN
INTRAMUSCULAR | Status: AC
  Filled 2021-12-11: qty 30

## 2021-12-11 MED ORDER — HYDROCODONE-ACETAMINOPHEN 5-325 MG PO TABS: 1.0000 | ORAL_TABLET | ORAL | 0 refills | Status: DC | PRN

## 2021-12-11 MED ORDER — OXYCODONE HCL 5 MG/5ML PO SOLN: 5.0000 mg | Freq: Once | ORAL | Status: DC | PRN

## 2021-12-11 MED ORDER — MORPHINE SULFATE 4 MG/ML IJ SOLN
INTRAMUSCULAR | Status: DC | PRN
  Administered 2021-12-11: 4 mg via INTRAVENOUS

## 2021-12-11 MED ORDER — ONDANSETRON HCL 4 MG/2ML IJ SOLN
INTRAMUSCULAR | Status: DC | PRN
  Administered 2021-12-11: 4 mg via INTRAVENOUS

## 2021-12-11 MED ORDER — METOCLOPRAMIDE HCL 10 MG PO TABS: 5.0000 mg | ORAL_TABLET | Freq: Three times a day (TID) | ORAL | Status: DC | PRN

## 2021-12-11 MED ORDER — HYDROCODONE-ACETAMINOPHEN 5-325 MG PO TABS: 1.0000 | ORAL_TABLET | ORAL | Status: DC | PRN

## 2021-12-11 MED ORDER — FENTANYL CITRATE (PF) 100 MCG/2ML IJ SOLN
INTRAMUSCULAR | Status: AC
  Filled 2021-12-11: qty 2

## 2021-12-11 MED ORDER — HYDROCODONE-ACETAMINOPHEN 7.5-325 MG PO TABS: 1.0000 | ORAL_TABLET | ORAL | Status: DC | PRN

## 2021-12-11 MED ORDER — ACETAMINOPHEN 325 MG PO TABS: 325.0000 mg | ORAL_TABLET | Freq: Four times a day (QID) | ORAL | Status: DC | PRN

## 2021-12-11 MED ORDER — CELECOXIB 200 MG PO CAPS
ORAL_CAPSULE | ORAL | Status: AC
  Administered 2021-12-11: 400 mg via ORAL
  Filled 2021-12-11: qty 2

## 2021-12-11 MED ORDER — DEXAMETHASONE SODIUM PHOSPHATE 10 MG/ML IJ SOLN
INTRAMUSCULAR | Status: DC | PRN
  Administered 2021-12-11: 5 mg via INTRAVENOUS

## 2021-12-11 MED ORDER — VASOPRESSIN 20 UNIT/ML IV SOLN
INTRAVENOUS | Status: DC | PRN
  Administered 2021-12-11: 2 [IU] via INTRAVENOUS
  Administered 2021-12-11 (×2): 1 [IU] via INTRAVENOUS
  Administered 2021-12-11 (×2): 2 [IU] via INTRAVENOUS

## 2021-12-11 MED ORDER — VASOPRESSIN 20 UNIT/ML IV SOLN
INTRAVENOUS | Status: AC
  Filled 2021-12-11: qty 1

## 2021-12-11 MED ORDER — DEXAMETHASONE SODIUM PHOSPHATE 10 MG/ML IJ SOLN
INTRAMUSCULAR | Status: AC
  Filled 2021-12-11: qty 1

## 2021-12-11 MED ORDER — LIDOCAINE HCL (CARDIAC) PF 100 MG/5ML IV SOSY
PREFILLED_SYRINGE | INTRAVENOUS | Status: DC | PRN
  Administered 2021-12-11: 100 mg via INTRAVENOUS

## 2021-12-11 MED ORDER — ACETAMINOPHEN 10 MG/ML IV SOLN
INTRAVENOUS | Status: AC
  Filled 2021-12-11: qty 100

## 2021-12-11 MED ORDER — BUPIVACAINE-EPINEPHRINE 0.25% -1:200000 IJ SOLN
INTRAMUSCULAR | Status: DC | PRN
  Administered 2021-12-11: 25 mL
  Administered 2021-12-11: 5 mL

## 2021-12-11 MED ORDER — FENTANYL CITRATE (PF) 100 MCG/2ML IJ SOLN
INTRAMUSCULAR | Status: DC | PRN
  Administered 2021-12-11: 50 ug via INTRAVENOUS

## 2021-12-11 MED ORDER — PROPOFOL 10 MG/ML IV BOLUS
INTRAVENOUS | Status: DC | PRN
  Administered 2021-12-11: 150 mg via INTRAVENOUS

## 2021-12-11 MED ORDER — RINGERS IRRIGATION IR SOLN
Status: DC | PRN
  Administered 2021-12-11 (×4): 3000 mL

## 2021-12-11 MED ORDER — CHLORHEXIDINE GLUCONATE 0.12 % MT SOLN
OROMUCOSAL | Status: AC
  Administered 2021-12-11: 15 mL via OROMUCOSAL
  Filled 2021-12-11: qty 15

## 2021-12-11 MED ORDER — GLYCOPYRROLATE 0.2 MG/ML IJ SOLN
INTRAMUSCULAR | Status: AC
  Filled 2021-12-11: qty 1

## 2021-12-11 MED ORDER — SODIUM CHLORIDE 0.9 % IV SOLN: INTRAVENOUS | Status: DC

## 2021-12-11 MED ORDER — FENTANYL CITRATE (PF) 100 MCG/2ML IJ SOLN: 25.0000 ug | INTRAMUSCULAR | Status: DC | PRN

## 2021-12-11 SURGICAL SUPPLY — 27 items
ADAPTER IRRIG TUBE 2 SPIKE SOL (ADAPTER) ×2 IMPLANT
BLADE SHAVER 4.5 DBL SERAT CV (CUTTER) IMPLANT
DRAPE ARTHRO LIMB 89X125 STRL (DRAPES) ×1 IMPLANT
DRSG DERMACEA NONADH 3X8 (GAUZE/BANDAGES/DRESSINGS) ×1 IMPLANT
DURAPREP 26ML APPLICATOR (WOUND CARE) ×1 IMPLANT
GAUZE SPONGE 4X4 12PLY STRL (GAUZE/BANDAGES/DRESSINGS) ×1 IMPLANT
GLOVE BIOGEL M STRL SZ7.5 (GLOVE) ×1 IMPLANT
GLOVE SURG UNDER LTX SZ8 (GLOVE) ×1 IMPLANT
GOWN STRL REUS W/ TWL LRG LVL3 (GOWN DISPOSABLE) ×2 IMPLANT
GOWN STRL REUS W/TWL LRG LVL3 (GOWN DISPOSABLE) ×2
IV LACTATED RINGER IRRG 3000ML (IV SOLUTION) ×4
IV LR IRRIG 3000ML ARTHROMATIC (IV SOLUTION) ×2 IMPLANT
KIT TURNOVER KIT A (KITS) ×1 IMPLANT
MANIFOLD NEPTUNE II (INSTRUMENTS) ×1 IMPLANT
PACK ARTHROSCOPY KNEE (MISCELLANEOUS) ×1 IMPLANT
SHAVER BLADE TAPERED BLUNT 4 (BLADE) ×1 IMPLANT
SOL PREP PVP 2OZ (MISCELLANEOUS) ×1
SOLUTION PREP PVP 2OZ (MISCELLANEOUS) ×1 IMPLANT
SPONGE T-LAP 18X18 ~~LOC~~+RFID (SPONGE) IMPLANT
SUT ETHILON 3-0 FS-10 30 BLK (SUTURE) ×1
SUTURE EHLN 3-0 FS-10 30 BLK (SUTURE) ×1 IMPLANT
TRAP FLUID SMOKE EVACUATOR (MISCELLANEOUS) ×1 IMPLANT
TUBING INFLOW SET DBFLO PUMP (TUBING) ×1 IMPLANT
TUBING OUTFLOW SET DBLFO PUMP (TUBING) ×1 IMPLANT
WAND HAND CNTRL MULTIVAC 50 (MISCELLANEOUS) ×1 IMPLANT
WATER STERILE IRR 500ML POUR (IV SOLUTION) ×1 IMPLANT
WRAP KNEE W/COLD PACKS 25.5X14 (SOFTGOODS) ×1 IMPLANT

## 2021-12-11 NOTE — Op Note (Signed)
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   ASSISTANT: none  ANESTHESIA: general  ESTIMATED BLOOD LOSS: Minimal  FLUIDS REPLACED: 1000 mL of crystalloid  TOURNIQUET TIME: Not used  INDICATIONS FOR SURGERY: OSMAN CALZADILLA is a 66 y.o. year old male who has been seen for complaints of left knee pain. MRI demonstrated findings consistent with meniscal pathology. After discussion of the risks and benefits of surgical intervention, the patient expressed understanding of the risks benefits and agree with plans for left knee arthroscopy.   PROCEDURE IN DETAIL: The patient was brought into the operating room and, after adequate general anesthesia was achieved, a tourniquet was applied to the left thigh and the leg was placed in the leg holder. All bony prominences were well padded. The patient's left knee was cleaned and prepped with alcohol and Duraprep and draped in the usual sterile fashion. A "timeout" was performed as per usual protocol. The anticipated portal sites were injected with 0.25% Marcaine with epinephrine. An anterolateral incision was made and a cannula was inserted. A medium effusion was evacuated and the knee was distended with fluid using the pump. The scope was advanced down the medial gutter into the medial compartment. Under visualization with the scope, an anteromedial portal was created and a hooked probe  was inserted. The medial meniscus was visualized and probed. There was a complex tear involving the posterior horn of the medial meniscus.  Most of the posterior margin was involved with prominent flaps appreciated.  The tear was debrided using combination of meniscal punches and the incisor shaver.  Additional contouring was performed using the 50 degree ArthroCare wand.  The remaining rim of meniscus was visualized and probed and felt to be stable.  The anterior horn was visualized and felt to be stable.The articular cartilage was visualized.  There was some localized grade III chondromalacia involving the medial femoral condyle.  The area was debrided using combination of the shaver and the 50 degree ArthroCare wand.  The scope was then advanced into the intercondylar notch. The anterior cruciate ligament was completely torn at its midportion.  The stump of the ACL was debrided using the incisor shaver and the 50 degree ArthroCare wand.. The scope was removed from the lateral portal and reinserted via the anteromedial portal to better visualize the lateral compartment. The lateral meniscus was visualized and probed.  There was a tear near the root of the posterior horn of the lateral meniscus.  The tear was debrided using meniscal punches and the incisor shaver.  Final contouring was performed using the ArthroCare wand.  The meniscus was then probed and felt to be stable.  The articular cartilage of the lateral compartment was visualized and noted to be in excellent condition.  Finally, the scope was advanced so as to visualize the patellofemoral articulation. Good patellar tracking was appreciated.  Only minimal chondral changes were appreciated to the patella.  The knee was  irrigated with copius amounts of fluid and suctioned dry. The anterolateral portal was re-approximated with #3-0 nylon. A combination of 0.25% Marcaine with epinephrine and 4 mg of Morphine were injected via the scope. The scope was  removed and the anteromedial portal was re-approximated with #3-0 nylon. A sterile dressing was applied followed by application of an ice wrap.  The patient tolerated the procedure well and was transported to the PACU in stable condition.  Bernhardt Riemenschneider P. Holley Bouche., M.D.

## 2021-12-11 NOTE — Anesthesia Procedure Notes (Signed)
Procedure Name: LMA Insertion Date/Time: 12/11/2021 3:56 PM  Performed by: Cammie Sickle, CRNAPre-anesthesia Checklist: Patient identified, Patient being monitored, Timeout performed, Emergency Drugs available and Suction available Patient Re-evaluated:Patient Re-evaluated prior to induction Oxygen Delivery Method: Circle system utilized Preoxygenation: Pre-oxygenation with 100% oxygen Induction Type: IV induction Ventilation: Mask ventilation without difficulty LMA: LMA inserted LMA Size: 4.0 Tube type: Oral Number of attempts: 1 Placement Confirmation: positive ETCO2 and breath sounds checked- equal and bilateral Tube secured with: Tape Dental Injury: Teeth and Oropharynx as per pre-operative assessment

## 2021-12-11 NOTE — Interval H&P Note (Signed)
History and Physical Interval Note:  12/11/2021 3:35 PM  Allen Tapia  has presented today for surgery, with the diagnosis of INTERNAL DERANGEMENT OF LEFT KNEE..  The various methods of treatment have been discussed with the patient and family. After consideration of risks, benefits and other options for treatment, the patient has consented to  Procedure(s): ARTHROSCOPY KNEE (Left) as a surgical intervention.  The patient's history has been reviewed, patient examined, no change in status, stable for surgery.  I have reviewed the patient's chart and labs.  Questions were answered to the patient's satisfaction.     [place]

## 2021-12-11 NOTE — Anesthesia Preprocedure Evaluation (Signed)
Anesthesia Evaluation  Patient identified by MRN, date of birth, ID band Patient awake    Reviewed: Allergy & Precautions, NPO status , Patient's Chart, lab work & pertinent test results  Airway Mallampati: III  TM Distance: >3 FB Neck ROM: full    Dental  (+) Dental Advidsory Given, Poor Dentition   Pulmonary neg shortness of breath, asthma , neg sleep apnea, neg recent URI, former smoker,    Pulmonary exam normal        Cardiovascular (-) angina+ CAD  (-) Past MI and (-) CABG Normal cardiovascular exam     Neuro/Psych PSYCHIATRIC DISORDERS negative neurological ROS     GI/Hepatic Neg liver ROS, GERD  ,  Endo/Other  negative endocrine ROS  Renal/GU      Musculoskeletal   Abdominal   Peds  Hematology negative hematology ROS (+)   Anesthesia Other Findings Past Medical History: No date: Anginal pain (HCC)     Comment:  atypical chest pain cardiac cath performed at that time No date: Arthritis No date: Asthma No date: Atypical chest pain No date: Barrett's esophagus No date: Bilateral cataracts No date: BPH (benign prostatic hyperplasia) No date: Chicken pox No date: Chronic constipation 07/14/2007: Coronary artery disease     Comment:  a.) LHC 07/14/2007: 40% pRCA-1, 40% pRCA-2, 40% dRCA,               20% RPL1, 30% LM, 20% mLCx-1, 40% mLCx-2, 20% OM3-1, 20%               OM3-2, 20% pLAD, 40% mLAD-1, 70% mLAD-2, 40% mLAD-3, 30%               dLAD, 40% D1, 40% D2 --> med mgmt. No date: Depression No date: Foreign body (FB) in soft tissue No date: Former tobacco use No date: GERD (gastroesophageal reflux disease) 04/07/2019: History of 2019 novel coronavirus disease (COVID-19) No date: History of kidney stones No date: Hyperlipidemia No date: Hyperplastic colon polyp No date: Infrarenal abdominal aortic aneurysm (AAA) without rupture  ([place])     Comment:  a.) MR lumbar spine 12/30/2020: infrarenal saccular  AAA               measuring 2.2 cm; b.) CTA A/P 01/05/2021: measured 2.6               cm; c.) s/p EVAR 03/03/2021 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 No date: Long term current use of immunosuppressive drug     Comment:  a.) on ofatumumab for MS No date: MS (multiple sclerosis) ([place])     Comment:  a.) on ofatumumab No date: Shingles 08/23/2016: Tubular adenoma of colon  Past Surgical History: 03/03/2021: ABDOMINAL AORTIC ENDOVASCULAR STENT GRAFT; N/A     Comment:  Procedure: ABDOMINAL AORTIC ENDOVASCULAR STENT GRAFT;                Surgeon: Waynetta Sandy, MD;  Location: [place];              Service: Vascular;  Laterality: N/A; 1960: ADENOIDECTOMY 1979: APPENDECTOMY 03/12/2019: CATARACT EXTRACTION, BILATERAL 02/05/2008: COLONOSCOPY 08/23/2016: COLONOSCOPY WITH PROPOFOL; N/A     Comment:  Procedure: COLONOSCOPY WITH PROPOFOL;  Surgeon:               Lollie Sails, MD;  Location: [facility] ENDOSCOPY;                Service: Endoscopy;  Laterality: N/A; 04/29/2020: COLONOSCOPY WITH PROPOFOL; N/A     Comment:  Procedure: COLONOSCOPY WITH PROPOFOL;  Surgeon:               Lesly Rubenstein, MD;  Location: [facility] ENDOSCOPY;                Service: Endoscopy;  Laterality: N/A; 05/20/2020: CYSTOSCOPY WITH INSERTION OF UROLIFT; N/A     Comment:  Procedure: CYSTOSCOPY WITH INSERTION OF UROLIFT;                Surgeon: Billey Co, MD;  Location: [facility] ORS;                Service: Urology;  Laterality: N/A; 08/23/2016: ESOPHAGOGASTRODUODENOSCOPY (EGD) WITH PROPOFOL; N/A     Comment:  Procedure: ESOPHAGOGASTRODUODENOSCOPY (EGD) WITH               PROPOFOL;  Surgeon: Lollie Sails, MD;  Location:               [facility] ENDOSCOPY;  Service: Endoscopy;  Laterality: N/A; 04/29/2020: ESOPHAGOGASTRODUODENOSCOPY (EGD) WITH PROPOFOL; N/A     Comment:  Procedure: ESOPHAGOGASTRODUODENOSCOPY (EGD) WITH               PROPOFOL;  Surgeon: Lesly Rubenstein, MD;  Location:                [facility] ENDOSCOPY;  Service: Endoscopy;  Laterality: N/A; 02/05/2008: ESOPHAGOGASTRODUODENOSCOPY ENDOSCOPY No date: EYE SURGERY 1970: eye trauma     Comment:  piece of metal to eye removed in clinic No date: JOINT REPLACEMENT 10/05/2020: KNEE ARTHROPLASTY; Right     Comment:  Procedure: COMPUTER ASSISTED TOTAL KNEE ARTHROPLASTY;                Surgeon: Dereck Leep, MD;  Location: [facility] ORS;                Service: Orthopedics;  Laterality: Right; 01/02/2011: KNEE ARTHROSCOPY W/ PARTIAL MEDIAL MENISCECTOMY; Right 07/14/2007: LEFT HEART CATH AND CORONARY ANGIOGRAPHY; Left     Comment:  Procedure: LEFT HEART CATH AND CORONARY ANGIOGRAPHY;               Location: [facility]; Surgeon: Doristine Bosworth, MD No date: SEPTOPLASTY 02/05/2008: TONSILLECTOMY 03/03/2021: ULTRASOUND GUIDANCE FOR VASCULAR ACCESS; Left     Comment:  Procedure: ULTRASOUND GUIDANCE FOR VASCULAR ACCESS, LEFT              FEMORAL ARTERY;  Surgeon: Waynetta Sandy, MD;               Location: [place];  Service: Vascular;  Laterality: Left; No date: WRIST FRACTURE SURGERY; Right  BMI    Body Mass Index: 25.68 kg/m      Reproductive/Obstetrics negative OB ROS                             Anesthesia Physical Anesthesia Plan  ASA: 3  Anesthesia Plan:    Post-op Pain Management:    Induction: Intravenous  PONV Risk Score and Plan: 2 and Dexamethasone, Ondansetron, Midazolam and Treatment may vary due to age or medical condition  Airway Management Planned: LMA  Additional Equipment:   Intra-op Plan:   Post-operative Plan: Extubation in OR  Informed Consent: I have reviewed the patients History and Physical, chart, labs and discussed the procedure including the risks, benefits and alternatives for the proposed anesthesia with the patient or authorized representative who has indicated his/her understanding and acceptance.     Dental Advisory  Given  Plan Discussed with:  Anesthesiologist, CRNA and Surgeon  Anesthesia Plan Comments: (Patient consented for risks of anesthesia including but not limited to:  - adverse reactions to medications - damage to eyes, teeth, lips or other oral mucosa - nerve damage due to positioning  - sore throat or hoarseness - Damage to heart, brain, nerves, lungs, other parts of body or loss of life  Patient voiced understanding.)        Anesthesia Quick Evaluation

## 2021-12-11 NOTE — Transfer of Care (Signed)
Immediate Anesthesia Transfer of Care Note  Patient: Allen Tapia  Procedure(s) Performed: ARTHROSCOPY KNEE (Left: Knee)  Patient Location: PACU  Anesthesia Type:General  Level of Consciousness: drowsy  Airway & Oxygen Therapy: Patient Spontanous Breathing and Patient connected to face mask oxygen  Post-op Assessment: Report given to RN and Post -op Vital signs reviewed and stable  Post vital signs: Reviewed and stable  Last Vitals:  Vitals Value Taken Time  BP 112/60 12/11/21 1741  Temp    Pulse 53 12/11/21 1745  Resp 14 12/11/21 1745  SpO2 100 % 12/11/21 1745  Vitals shown include unvalidated device data.  Last Pain:  Vitals:   12/11/21 1447  TempSrc: Temporal  PainSc: 0-No pain         Complications: No notable events documented.

## 2021-12-12 ENCOUNTER — Encounter: Payer: Self-pay | Admitting: Orthopedic Surgery

## 2021-12-12 NOTE — Anesthesia Postprocedure Evaluation (Signed)
Anesthesia Post Note  Patient: Allen Tapia  Procedure(s) Performed: ARTHROSCOPY KNEE (Left: Knee)  Patient location during evaluation: PACU Anesthesia Type: General Level of consciousness: awake and alert Pain management: pain level controlled Vital Signs Assessment: post-procedure vital signs reviewed and stable Respiratory status: spontaneous breathing, nonlabored ventilation, respiratory function stable and patient connected to nasal cannula oxygen Cardiovascular status: blood pressure returned to baseline and stable Postop Assessment: no apparent nausea or vomiting Anesthetic complications: no   No notable events documented.   Last Vitals:  Vitals:   12/11/21 1815 12/11/21 1826  BP: 114/74 126/64  Pulse: 74 75  Resp: 16 16  Temp:  (!) 36.2 C  SpO2: 93% 94%    Last Pain:  Vitals:   12/11/21 1826  TempSrc: Temporal  PainSc: 0-No pain                 Precious Haws Maryana Pittmon

## 2022-01-22 ENCOUNTER — Encounter (INDEPENDENT_AMBULATORY_CARE_PROVIDER_SITE_OTHER): Payer: Self-pay

## 2022-01-29 ENCOUNTER — Other Ambulatory Visit: Payer: Self-pay | Admitting: Cardiology

## 2022-01-29 DIAGNOSIS — I2510 Atherosclerotic heart disease of native coronary artery without angina pectoris: Secondary | ICD-10-CM

## 2022-02-07 ENCOUNTER — Ambulatory Visit (INDEPENDENT_AMBULATORY_CARE_PROVIDER_SITE_OTHER): Payer: Medicare Other

## 2022-02-07 ENCOUNTER — Ambulatory Visit (INDEPENDENT_AMBULATORY_CARE_PROVIDER_SITE_OTHER): Payer: Medicare Other | Admitting: Primary Care

## 2022-02-07 ENCOUNTER — Encounter: Payer: Self-pay | Admitting: Primary Care

## 2022-02-07 VITALS — BP 122/80 | HR 71 | Ht 72.0 in | Wt 204.4 lb

## 2022-02-07 DIAGNOSIS — J4520 Mild intermittent asthma, uncomplicated: Secondary | ICD-10-CM

## 2022-02-07 MED ORDER — MONTELUKAST SODIUM 10 MG PO TABS: 10.0000 mg | ORAL_TABLET | Freq: Every day | ORAL | 3 refills | Status: DC

## 2022-02-07 MED ORDER — BUDESONIDE-FORMOTEROL FUMARATE 160-4.5 MCG/ACT IN AERO: 1.0000 | INHALATION_SPRAY | Freq: Two times a day (BID) | RESPIRATORY_TRACT | 11 refills | Status: DC

## 2022-02-07 NOTE — Patient Instructions (Addendum)
Recommendations: - Continue Symbicort 1 to 2 puffs morning and evening - Continue albuterol 2 puffs every 4-6 hours as needed for breakthrough shortness of breath, wheezing or cough - Continue Montelukast (Singulair) '10mg'$  at bedtime  - Notify office if you develop increased shortness of breath, wheezing or mucus production  - We can decrease Symbicort following the winter   Orders: - CXR (ordered)  Follow-up: - 1 year with Dr. Valeta Harms or sooner if needed

## 2022-02-07 NOTE — Assessment & Plan Note (Signed)
-   Well controlled; No recent exacerbations. Dyspnea is baseline. He has an occasional cough. ACT score 24. - Continue Symbicort 160 1-2 puffs q 12 hours, we will consider decreasing dose after winter  - Continue Albuterol hfa 2 puffs every 4-6 hours for breakthrough sob,wheezing or cough - Continue Montelukast (Singulair) '10mg'$  at bedtime  - Checking CXR to monitor scarring/atelectasis right lung base - Patient would like to hold off on pulmonary function testing, reasonable since he remains stable respiratory wise and he has no worsening dyspnea or cough symptoms  - FU in 1 year or sooner if needed

## 2022-02-07 NOTE — Progress Notes (Signed)
$'@Patient'c$  ID: Allen Tapia, male    DOB: [birth year], 66 y.o.   MRN: [id number]  No chief complaint on file.   Referring provider: Derinda Late, MD  HPI: 66 year old male, former smoker quit 2008 (20 pack year hx). PMH significant for mild intermittent asthma, MS, barrett's esophagus, CAD, tobacco abuse. Patient of Dr. Valeta Harms. Maintained on Symbicort 160, decreased to 1 puff twice daily with spacer. Continue Montelukast, if symptoms continues consider additional of daily antihistamine.   Previous [facility] pulmonary encounters:  Patient complains of cough for the past several weeks. He has episodic events since then. He took a group of teenagers to the beach and got sick for a few days with URI symptoms the cough has been persistent. Former smoker, quit 20 years ago, smoked for 20 years, for about 1 ppd. He had pulmonary function tests in 2009. 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 He notices that his cough is worse when its cold. He has had allergies as a child. He had significant allergies as a kid. Never hospitalized as a child for respiratory symptoms. He does have a dry mouth. He does take trazadone and benadryl at bedtime. No eczema. He was dx with MS, dx in 2012. His gait has been the most difficult. He a swallow study 5 years ago. He has had episodes of cough/choking recently.    OV 12/30/2018: Doing well since last office visit.  He used Symbicort for a short period of time.  His symptoms abated.  He did well throughout the summer with no issues.  This fall noticed with the change in weather developed chest tightness with wheezing again.  Had an exacerbation a few weeks ago.  He was called to set up his PFT appointment at this time noticed that he was still having much difficulty breathing.  He did restart his Symbicort samples that he was given in the past.  After about a week of Symbicort his symptoms significantly improved no longer had dyspnea on exertion shortness of breath  chest tightness or wheezing.  At this time he is doing much better understands that he needs to be on medication daily.  He has restarted it does need refills of this medication.   OV 03/26/2019: Patient here today for follow-up from her's symptoms of cough and shortness of breath.  He was restarted on Symbicort.  His symptoms have completely dissipated.  No longer having chest tightness no longer having wheezing.  His cough is also gone.  He is doing much better.  Using his inhalers regularly with spacer.  He has occasionally noticed some hoarseness of voice.  Overall respiratory symptoms are better.  Denies hemoptysis shortness of breath chest pain chest tightness or wheezing.   12/07/2019 Patient presents today for 6-8 month follow-up/ mild intermittent asthma. He had a mild case of Covid-19 in January 2021. No acute complaints today. He is complaints with Symbicort and Singulair.   His appoint was delays s/t scheduled with covid. When he feels well he does not have nay wheezing. He recently noticed increased wheezing symptoms 3-4 weeks ago. No significant cough. He is currently doing some home renovations outside including power washing. He is moving grass.  He is currently back on 2 puffs Symbicort 160 twice a day. He has missed an occasional evening dose a couple times a week. Using rescue inhaler mostly twice a day. He is compliant with Singulair. He has slight cough with little mucus production. Planning [place] mission trip in  December. He has had both covid vaccines and is hopping to get boost shot.   06/06/2020  Patient presents today for 6 month follow-up. He is doing well, no acute complaints except for congested upper airway cough. He has been taking Symbicort 1-2 puffs twice a day.  He needs full dose of inhaler more in winter time. On average he uses rescue inhaler 2 times a week for intermittent wheezing. He has a frequent cough several times a day, he gets up a lot of clear mucus. He is  taking Singulair '10mg'$  at bedtime. He recently traveled to [place] this past year for mission trip. ACT 23.    OV 02/24/2021: Recently found an incidentaly saccular aortic aneurysm with a plans for surgery. Here today to see [place] for eval prior to surgery. He has has allergy symptoms and has had cough, dry, barking at time, early morning with productive cough. He has drainage issues when he wakes up in the morning.  Overall he does feel like he is doing better over the past few weeks his flareup occurred during the fall after a bout of working outside leads.   is is a 66 year old gentleman, former smoker, environmental allergies, seasonal allergies, multiple sclerosis.  Patient with mild intermittent asthma symptoms doing well on Symbicort daily plus as needed albuterol  He is planned for a vascular surgery next week.   02/07/2022- Interim hx  Patient presents today for annual OV for asthma. Breathing remains stable. No acute issues. He denies any worsening shortness of breath symptoms. He takes Symbicort 160 one puff twice daily and Singulair '10mg'$  at bedtime. He uses albuterol on average <1-2 times a week d/t cough. He has some nasal congestion. He is not currently using any over the counter nasal sprays. He is having some knee issues, no surgical date as of right now.  ACT score- 24     No Known Allergies  Immunization History  Administered Date(s) Administered   Influenza, High Dose Seasonal PF 12/25/2019   Influenza,inj,Quad PF,6+ Mos 01/18/2017, 01/21/2018, 01/09/2019   Influenza-Unspecified 12/24/2018   PFIZER(Purple Top)SARS-COV-2 Vaccination 06/15/2019, 07/13/2019   Pneumococcal Conjugate-13 01/21/2018   Pneumococcal Polysaccharide-23 07/23/2013, 01/29/2019   Td 09/23/2009    Past Medical History:  Diagnosis Date   Anginal pain ([place])    atypical chest pain cardiac cath performed at that time   Arthritis    Asthma    Atypical chest pain    Barrett's esophagus    Bilateral  cataracts    BPH (benign prostatic hyperplasia)    Chicken pox    Chronic constipation    Coronary artery disease 07/14/2007   a.) LHC 07/14/2007: 40% pRCA-1, 40% pRCA-2, 40% dRCA, 20% RPL1, 30% LM, 20% mLCx-1, 40% mLCx-2, 20% OM3-1, 20% OM3-2, 20% pLAD, 40% mLAD-1, 70% mLAD-2, 40% mLAD-3, 30% dLAD, 40% D1, 40% D2 --> med mgmt.   Depression    Foreign body (FB) in soft tissue    Former tobacco use    GERD (gastroesophageal reflux disease)    History of 2019 novel coronavirus disease (COVID-19) 04/07/2019   History of kidney stones    Hyperlipidemia    Hyperplastic colon polyp    Infrarenal abdominal aortic aneurysm (AAA) without rupture ([place])    a.) MR lumbar spine 12/30/2020: infrarenal saccular AAA measuring 2.2 cm; b.) CTA A/P 01/05/2021: measured 2.6 cm; c.) s/p EVAR 03/03/2021   Ischemic heart disease 12/20/2010   Long term current use of immunosuppressive drug    a.) on ofatumumab for  MS   MS (multiple sclerosis) ([place])    a.) on ofatumumab   Shingles    Tubular adenoma of colon 08/23/2016    Tobacco History: Social History   Tobacco Use  Smoking Status Former   Packs/day: 1.00   Years: 20.00   Total pack years: 20.00   Types: Cigarettes   Start date: 77   Quit date: 09/23/2006   Years since quitting: 15.3  Smokeless Tobacco Never   Counseling given: Not Answered   Outpatient Medications Prior to Visit  Medication Sig Dispense Refill   albuterol (VENTOLIN HFA) 108 (90 Base) MCG/ACT inhaler Inhale 2 puffs into the lungs every 6 (six) hours as needed for wheezing or shortness of breath. 54 g 3   Spacer/Aero-Holding Chambers (AEROCHAMBER MV) inhaler Use as instructed 1 each 0   montelukast (SINGULAIR) 10 MG tablet TAKE 1 TABLET AT BEDTIME (Patient taking differently: Take 10 mg by mouth at bedtime.) 90 tablet 3   SYMBICORT 160-4.5 MCG/ACT inhaler USE 2 INHALATIONS IN THE MORNING AND AT BEDTIME (Patient taking differently: 1-2 puffs in the morning and at bedtime.)  30.6 g 3   acetaminophen (TYLENOL) 500 MG tablet Take 500 mg by mouth every 6 (six) hours as needed for moderate pain.     amphetamine-dextroamphetamine (ADDERALL XR) 25 MG 24 hr capsule Take 25 mg by mouth daily as needed (focus).     aspirin EC 81 MG tablet Take 81 mg by mouth daily. Swallow whole.     b complex vitamins capsule Take 1 capsule by mouth at bedtime.     baclofen (LIORESAL) 20 MG tablet Take 20 mg by mouth 3 (three) times daily.     buPROPion (WELLBUTRIN XL) 150 MG 24 hr tablet Take 150 mg by mouth every morning.     celecoxib (CELEBREX) 200 MG capsule Take 200 mg by mouth 2 (two) times daily.     fluorouracil (EFUDEX) 5 % cream Apply 1 Dose topically as needed. (Patient not taking: Reported on 12/11/2021)     gabapentin (NEURONTIN) 300 MG capsule Take 1 capsule by mouth 3 (three) times daily.     HYDROcodone-acetaminophen (NORCO) 5-325 MG tablet Take 1-2 tablets by mouth every 4 (four) hours as needed for moderate pain. 10 tablet 0   mirabegron ER (MYRBETRIQ) 50 MG TB24 tablet Take 1 tablet (50 mg total) by mouth daily. (Patient taking differently: Take 50 mg by mouth at bedtime.) 90 tablet 3   Multiple Vitamins-Minerals (MULTIVITAMIN WITH MINERALS) tablet Take 1 tablet by mouth daily.     Ofatumumab (KESIMPTA) 20 MG/0.4ML SOAJ Inject 1 Dose into the skin every 30 (thirty) days. 1st day of every month     pantoprazole (PROTONIX) 20 MG tablet Take 1 tablet by mouth at bedtime.     Polyvinyl Alcohol (LIQUID TEARS OP) Place 1 drop into both eyes 2 (two) times daily.     rosuvastatin (CRESTOR) 20 MG tablet TAKE 1 TABLET AT BEDTIME 90 tablet 3   sodium chloride (OCEAN) 0.65 % SOLN nasal spray Place 1 spray into both nostrils 4 (four) times daily as needed for congestion.     tamsulosin (FLOMAX) 0.4 MG CAPS capsule TAKE 1 CAPSULE DAILY (Patient taking differently: Take 0.4 mg by mouth daily after breakfast.) 90 capsule 3   traZODone (DESYREL) 100 MG tablet Take 100 mg by mouth at  bedtime.     VITAMIN D PO Take 1 capsule by mouth daily.     No facility-administered medications prior to visit.  Review of Systems  Review of Systems  Constitutional: Negative.   HENT: Negative.    Respiratory:  Positive for cough. Negative for chest tightness, shortness of breath and wheezing.   Cardiovascular: Negative.    Physical Exam  BP 122/80 (BP Location: Right Arm)   Pulse 71   Ht 6' (1.829 m)   Wt 204 lb 6.4 oz (92.7 kg)   SpO2 95%   BMI 27.72 kg/m  Physical Exam Constitutional:      Appearance: Normal appearance.  HENT:     Head: Normocephalic and atraumatic.     Mouth/Throat:     Mouth: Mucous membranes are moist.     Pharynx: Oropharynx is clear.  Cardiovascular:     Rate and Rhythm: Normal rate and regular rhythm.  Pulmonary:     Effort: Pulmonary effort is normal.     Breath sounds: Normal breath sounds. No wheezing or rhonchi.     Comments: Fine rales right mid-lung, otherwise clear  Skin:    General: Skin is warm and dry.  Neurological:     General: No focal deficit present.     Mental Status: He is alert and oriented to person, place, and time. Mental status is at baseline.  Psychiatric:        Mood and Affect: Mood normal.        Behavior: Behavior normal.        Thought Content: Thought content normal.        Judgment: Judgment normal.      Lab Results:  CBC    Component Value Date/Time   WBC 11.1 (H) 03/04/2021 0159   RBC 4.20 (L) 03/04/2021 0159   HGB 13.0 03/04/2021 0159   HCT 39.2 03/04/2021 0159   PLT 205 03/04/2021 0159   MCV 93.3 03/04/2021 0159   MCH 31.0 03/04/2021 0159   MCHC 33.2 03/04/2021 0159   RDW 14.1 03/04/2021 0159   LYMPHSABS 0.1 (L) 08/23/2016 0848   MONOABS 0.6 08/23/2016 0848   EOSABS 0.2 08/23/2016 0848   BASOSABS 0.0 08/23/2016 0848    BMET    Component Value Date/Time   NA 138 03/04/2021 0159   K 4.0 03/04/2021 0159   CL 102 03/04/2021 0159   CO2 25 03/04/2021 0159   GLUCOSE 143 (H) 03/04/2021  0159   BUN 13 03/04/2021 0159   CREATININE 0.81 03/04/2021 0159   CALCIUM 8.2 (L) 03/04/2021 0159   GFRNONAA >60 03/04/2021 0159    BNP No results found for: "BNP"  ProBNP No results found for: "PROBNP"  Imaging: No results found.   Assessment & Plan:   Mild intermittent asthma - Well controlled; No recent exacerbations. Dyspnea is baseline. He has an occasional cough. ACT score 24. - Continue Symbicort 160 1-2 puffs q 12 hours, we will consider decreasing dose after winter  - Continue Albuterol hfa 2 puffs every 4-6 hours for breakthrough sob,wheezing or cough - Continue Montelukast (Singulair) '10mg'$  at bedtime  - Checking CXR to monitor scarring/atelectasis right lung base - Patient would like to hold off on pulmonary function testing, reasonable since he remains stable respiratory wise and he has no worsening dyspnea or cough symptoms  - FU in 1 year or sooner if needed    Martyn Ehrich, NP 02/07/2022

## 2022-02-14 ENCOUNTER — Telehealth (HOSPITAL_BASED_OUTPATIENT_CLINIC_OR_DEPARTMENT_OTHER): Payer: Self-pay | Admitting: Primary Care

## 2022-02-14 MED ORDER — AZITHROMYCIN 250 MG PO TABS: 250.0000 mg | ORAL_TABLET | Freq: Every day | ORAL | 0 refills | Status: DC

## 2022-02-14 NOTE — Telephone Encounter (Signed)
Send in zpack to take in 2-3 days if cough turns purulent and can refill cough medicine

## 2022-02-16 NOTE — Telephone Encounter (Signed)
Called pt's pharmacy and spoke with the pharmacist and clarified instructions for the zpak. Nothing further needed.

## 2022-02-19 ENCOUNTER — Other Ambulatory Visit: Payer: Self-pay

## 2022-02-19 MED ORDER — BUDESONIDE-FORMOTEROL FUMARATE 160-4.5 MCG/ACT IN AERO: 1.0000 | INHALATION_SPRAY | Freq: Two times a day (BID) | RESPIRATORY_TRACT | 3 refills | Status: DC

## 2022-02-21 ENCOUNTER — Ambulatory Visit (INDEPENDENT_AMBULATORY_CARE_PROVIDER_SITE_OTHER): Payer: Medicare Other | Admitting: Cardiology

## 2022-02-21 ENCOUNTER — Encounter (HOSPITAL_BASED_OUTPATIENT_CLINIC_OR_DEPARTMENT_OTHER): Payer: Self-pay | Admitting: Cardiology

## 2022-02-21 VITALS — BP 110/78 | HR 62 | Ht 72.0 in | Wt 203.9 lb

## 2022-02-21 DIAGNOSIS — E785 Hyperlipidemia, unspecified: Secondary | ICD-10-CM | POA: Diagnosis not present

## 2022-02-21 DIAGNOSIS — I2510 Atherosclerotic heart disease of native coronary artery without angina pectoris: Secondary | ICD-10-CM | POA: Diagnosis not present

## 2022-02-21 DIAGNOSIS — Z8249 Family history of ischemic heart disease and other diseases of the circulatory system: Secondary | ICD-10-CM

## 2022-02-21 DIAGNOSIS — Z7189 Other specified counseling: Secondary | ICD-10-CM | POA: Diagnosis not present

## 2022-02-21 NOTE — Patient Instructions (Signed)
Medication Instructions:  Your physician recommends that you continue on your current medications as directed. Please refer to the Current Medication list given to you today.  *If you need a refill on your cardiac medications before your next appointment, please call your pharmacy*   Follow-Up: At [facility], you and your health needs are our priority.  As part of our continuing mission to provide you with exceptional heart care, we have created designated Provider Care Teams.  These Care Teams include your primary Cardiologist (physician) and Advanced Practice Providers (APPs -  Physician Assistants and Nurse Practitioners) who all work together to provide you with the care you need, when you need it.  We recommend signing up for the patient portal called "MyChart".  Sign up information is provided on this After Visit Summary.  MyChart is used to connect with patients for Virtual Visits (Telemedicine).  Patients are able to view lab/test results, encounter notes, upcoming appointments, etc.  Non-urgent messages can be sent to your provider as well.   To learn more about what you can do with MyChart, go to [email or web address].    Your next appointment:   1 year(s)  The format for your next appointment:   In Person  Provider:   Buford Dresser, MD

## 2022-02-21 NOTE — Progress Notes (Signed)
Cardiology Office Note:    Date:  02/21/2022   ID:  Allen Tapia, DOB April 30, 1955, MRN [id number]  PCP:  Derinda Late, MD  Cardiologist:  Buford Dresser, MD  Referring MD: Derinda Late, MD   CC: follow up  History of Present Illness:    Allen Tapia is a 66 y.o. male with a hx of coronary disease, multiple sclerosis, squamous cell skin cancer who is seen for follow up. I initially met him 05/04/19 as a new consult at the request of Derinda Late, MD for the evaluation and management of coronary artery disease.  Cardiac history: Cardiac cath ([facility]) 07/14/2007: obstructive LAD disease only, not intervened on as there was no matching ischemia on stress testing/atypical symptoms, EF 65%. Other vessels with nonobstructive lesions Risk: hyperlipidemia, former tobacco use.   Has chronic MSK chest pain, always sternal. Occurs with very specific movements, and he knows what will trigger it.  Have discussed adderall at prior visit.  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   He most recently followed up with Christen Bame, NP on 12/05/2021 for preoperative clearance. He complained of occasional palpitations lasting a few seconds.  Today, he is feeling well from a cardiovascular perspective. However, he is struggling with more limited mobility due to issues with his knees. He had a right total knee arthroplasty one year ago, and since August he has suffered a left torn meniscus and ACL. Currently his balance issues have improved, but his knee pain has worsened. For treatment he is frequently taking tylenol.  No anginal symptoms from using his lawnmower.  He denies any palpitations, chest pain, shortness of breath, or peripheral edema. No lightheadedness, headaches, syncope, orthopnea, or PND.   Past Medical History:  Diagnosis Date   Anginal pain ([place])    atypical chest pain cardiac cath  performed at that time   Arthritis    Asthma    Atypical chest pain    Barrett's esophagus    Bilateral cataracts    BPH (benign prostatic hyperplasia)    Chicken pox    Chronic constipation    Coronary artery disease 07/14/2007   a.) LHC 07/14/2007: 40% pRCA-1, 40% pRCA-2, 40% dRCA, 20% RPL1, 30% LM, 20% mLCx-1, 40% mLCx-2, 20% OM3-1, 20% OM3-2, 20% pLAD, 40% mLAD-1, 70% mLAD-2, 40% mLAD-3, 30% dLAD, 40% D1, 40% D2 --> med mgmt.   Depression    Foreign body (FB) in soft tissue    Former tobacco use    GERD (gastroesophageal reflux disease)    History of 2019 novel coronavirus disease (COVID-19) 04/07/2019   History of kidney stones    Hyperlipidemia    Hyperplastic colon polyp    Infrarenal abdominal aortic aneurysm (AAA) without rupture ([place])    a.) MR lumbar spine 12/30/2020: infrarenal saccular AAA measuring 2.2 cm; b.) CTA A/P 01/05/2021: measured 2.6 cm; c.) s/p EVAR 03/03/2021   Ischemic heart disease 12/20/2010   Long term current use of immunosuppressive drug    a.) on ofatumumab for MS   MS (multiple sclerosis) ([place])    a.) on ofatumumab   Shingles    Tubular adenoma of colon 08/23/2016    Past Surgical History:  Procedure Laterality Date   ABDOMINAL AORTIC ENDOVASCULAR STENT GRAFT N/A 03/03/2021   Procedure: ABDOMINAL AORTIC ENDOVASCULAR STENT GRAFT;  Surgeon: Waynetta Sandy, MD;  Location: [place];  Service: Vascular;  Laterality: N/A;  [place]   CATARACT EXTRACTION, BILATERAL  03/12/2019   COLONOSCOPY  02/05/2008   COLONOSCOPY WITH PROPOFOL N/A 08/23/2016   Procedure: COLONOSCOPY WITH PROPOFOL;  Surgeon: Lollie Sails, MD;  Location: [facility] ENDOSCOPY;  Service: Endoscopy;  Laterality: N/A;   COLONOSCOPY WITH PROPOFOL N/A 04/29/2020   Procedure: COLONOSCOPY WITH PROPOFOL;  Surgeon: Lesly Rubenstein, MD;  Location: [facility] ENDOSCOPY;  Service: Endoscopy;  Laterality: N/A;   CYSTOSCOPY WITH INSERTION OF UROLIFT N/A  05/20/2020   Procedure: CYSTOSCOPY WITH INSERTION OF UROLIFT;  Surgeon: Billey Co, MD;  Location: [facility] ORS;  Service: Urology;  Laterality: N/A;   ESOPHAGOGASTRODUODENOSCOPY (EGD) WITH PROPOFOL N/A 08/23/2016   Procedure: ESOPHAGOGASTRODUODENOSCOPY (EGD) WITH PROPOFOL;  Surgeon: Lollie Sails, MD;  Location: [facility] ENDOSCOPY;  Service: Endoscopy;  Laterality: N/A;   ESOPHAGOGASTRODUODENOSCOPY (EGD) WITH PROPOFOL N/A 04/29/2020   Procedure: ESOPHAGOGASTRODUODENOSCOPY (EGD) WITH PROPOFOL;  Surgeon: Lesly Rubenstein, MD;  Location: [facility] ENDOSCOPY;  Service: Endoscopy;  Laterality: N/A;   ESOPHAGOGASTRODUODENOSCOPY ENDOSCOPY  02/05/2008   EYE SURGERY     eye trauma  1970   piece of metal to eye removed in clinic   JOINT REPLACEMENT     KNEE ARTHROPLASTY Right 10/05/2020   Procedure: COMPUTER ASSISTED TOTAL KNEE ARTHROPLASTY;  Surgeon: Dereck Leep, MD;  Location: [facility] ORS;  Service: Orthopedics;  Laterality: Right;   KNEE ARTHROSCOPY Left 12/11/2021   Procedure: ARTHROSCOPY KNEE;  Surgeon: Dereck Leep, MD;  Location: [facility] ORS;  Service: Orthopedics;  Laterality: Left;   KNEE ARTHROSCOPY W/ PARTIAL MEDIAL MENISCECTOMY Right 01/02/2011   LEFT HEART CATH AND CORONARY ANGIOGRAPHY Left 07/14/2007   Procedure: LEFT HEART CATH AND CORONARY ANGIOGRAPHY; Location: [facility]; Surgeon: Doristine Bosworth, MD   SEPTOPLASTY     TONSILLECTOMY  02/05/2008   ULTRASOUND GUIDANCE FOR VASCULAR ACCESS Left 03/03/2021   Procedure: ULTRASOUND GUIDANCE FOR VASCULAR ACCESS, LEFT FEMORAL ARTERY;  Surgeon: Waynetta Sandy, MD;  Location: [place];  Service: Vascular;  Laterality: Left;   WRIST FRACTURE SURGERY Right     Current Medications: Current Outpatient Medications on File Prior to Visit  Medication Sig   acetaminophen (TYLENOL) 500 MG tablet Take 500 mg by mouth every 6 (six) hours as needed for moderate pain.   albuterol (VENTOLIN HFA) 108 (90 Base) MCG/ACT inhaler Inhale 2 puffs into the  lungs every 6 (six) hours as needed for wheezing or shortness of breath.   amphetamine-dextroamphetamine (ADDERALL XR) 25 MG 24 hr capsule Take 25 mg by mouth daily as needed (focus).   aspirin EC 81 MG tablet Take 81 mg by mouth daily. Swallow whole.   azithromycin (ZITHROMAX) 250 MG tablet Take 1 tablet (250 mg total) by mouth daily.   b complex vitamins capsule Take 1 capsule by mouth at bedtime.   baclofen (LIORESAL) 20 MG tablet Take 20 mg by mouth 3 (three) times daily.   budesonide-formoterol (SYMBICORT) 160-4.5 MCG/ACT inhaler Inhale 1-2 puffs into the lungs in the morning and at bedtime.   buPROPion (WELLBUTRIN XL) 150 MG 24 hr tablet Take 150 mg by mouth every morning.   celecoxib (CELEBREX) 200 MG capsule Take 200 mg by mouth 2 (two) times daily.   gabapentin (NEURONTIN) 300 MG capsule Take 1 capsule by mouth 3 (three) times daily.   mirabegron ER (MYRBETRIQ) 50 MG TB24 tablet Take 1 tablet (50 mg total) by mouth daily. (Patient taking differently: Take 50 mg by mouth at bedtime.)   montelukast (SINGULAIR) 10 MG  tablet Take 1 tablet (10 mg total) by mouth at bedtime.   Multiple Vitamins-Minerals (MULTIVITAMIN WITH MINERALS) tablet Take 1 tablet by mouth daily.   Ofatumumab (KESIMPTA) 20 MG/0.4ML SOAJ Inject 1 Dose into the skin every 30 (thirty) days. 1st day of every month   pantoprazole (PROTONIX) 20 MG tablet Take 1 tablet by mouth at bedtime.   Polyvinyl Alcohol (LIQUID TEARS OP) Place 1 drop into both eyes 2 (two) times daily.   rosuvastatin (CRESTOR) 20 MG tablet TAKE 1 TABLET AT BEDTIME   sodium chloride (OCEAN) 0.65 % SOLN nasal spray Place 1 spray into both nostrils 4 (four) times daily as needed for congestion.   Spacer/Aero-Holding Chambers (AEROCHAMBER MV) inhaler Use as instructed   tamsulosin (FLOMAX) 0.4 MG CAPS capsule TAKE 1 CAPSULE DAILY (Patient taking differently: Take 0.4 mg by mouth daily after breakfast.)   traZODone (DESYREL) 100 MG tablet Take 100 mg by mouth  at bedtime.   VITAMIN D PO Take 1 capsule by mouth daily.   fluorouracil (EFUDEX) 5 % cream Apply 1 Dose topically as needed. (Patient not taking: Reported on 12/11/2021)   HYDROcodone-acetaminophen (NORCO) 5-325 MG tablet Take 1-2 tablets by mouth every 4 (four) hours as needed for moderate pain. (Patient not taking: Reported on 02/21/2022)   No current facility-administered medications on file prior to visit.     Allergies:   Patient has no known allergies.   Social History   Tobacco Use   Smoking status: Former    Packs/day: 1.00    Years: 20.00    Total pack years: 20.00    Types: Cigarettes    Start date: 9    Quit date: 09/23/2006    Years since quitting: 15.4   Smokeless tobacco: Never  Vaping Use   Vaping Use: Never used  Substance Use Topics   Alcohol use: No   Drug use: No    Family History: family history includes Cancer in his father; Cardiomyopathy in his mother; Cataracts in his mother; Colon polyps in his mother; Esophageal cancer in his cousin; Glaucoma in his mother; Heart disease in his father and mother; Hypertension in his father; Irregular heart beat in his mother. father had 5V CABG in his mid-50s, did well for 5 years, then quickly declined with cancer. Had hypertension. Mother had a viral illness and suspected cardiomyopathy from it, had a pacemaker for the last 15 years of life. Had unclear valve issue, pacemaker turned off at end of life  ROS:   Please see the history of present illness.   (+) Left knee pain Additional pertinent ROS otherwise unremarkable.  EKGs/Labs/Other Studies Reviewed:    The following studies were reviewed today:  CTA Abdomen/Pelvis  04/05/2021: IMPRESSION: Since CTA AP dated 12/2020;   VASCULAR   1. Interval endograft repair of saccular infrarenal abdominal aortic aneurysm. 2. Decreased excluded sac diameter without evidence of early or delayed enhancement.   NON-VASCULAR   1. No acute abdominopelvic findings. 2.  Moderate burden of sigmoid diverticulosis. Additional incidental chronic and senescent findings, as above.  Cath 06/2007 ([facility]) CORONARY ARTERIOGRAMS   Dominance: right   SA node origin: right   AV node origin: right   Branch                    Stenosis  Lesion Type  TIMI Flow   ------------------------  --------  -----------  ---------   Right Coronary Artery     Prox RCA  40%       Discrete     Prox RCA                40%       Discrete     Dist RCA                40%       Discrete     RPL1                    20%       Discrete     RPL2  (Small)     RPL3  (Small)   Left Main     L Main                  30%       Discrete   Left Circumflex     Mid LCX                 20%       Discrete     Mid LCX                 40%       Discrete     OM3  (Large)            20%       Discrete     OM3  (Large)            20%       Discrete   Left Anterior Descending     Prox LAD                20%       Discrete     Mid LAD                 40%       Diffuse     *Mid LAD                70%       Discrete     Mid LAD                 40%       Discrete     Dist LAD                20%       Discrete     D1                      40%       Discrete     D2                      40%       Discrete  MPI ([facility]) 07/08/2007 Left Ventricle Analysis :                            Rest           Stress         Conclusions      - High Anterolateral  Normal         Normal         Normal      - Low Anterolateral   Normal         Normal         Normal      -  High Posterolateral Normal         Normal         Normal      - Low Posterolateral  Normal         Normal         Normal      - High Anterior       Normal         Normal         Normal      - Low Anterior        Normal         Normal         Normal      - Anteroapical        Normal         Normal         Normal      - Posterobasal        Normal         Dec mod        Ischemia      - Inferior            Normal         Dec mod        Ischemia       - Inferoapical        Normal         Normal         Normal      - Inferoseptal        Normal         Normal         Normal      - Anteroseptal        Normal         Normal         Normal   EKG:  EKG is personally reviewed.   02/21/2022:  not ordered today 05/18/2020:  NSR at 64 bpm  Recent Labs: 02/28/2021: ALT 38 03/03/2021: Magnesium 2.1 03/04/2021: BUN 13; Creatinine, Ser 0.81; Hemoglobin 13.0; Platelets 205; Potassium 4.0; Sodium 138  Recent Lipid Panel No results found for: "CHOL", "TRIG", "HDL", "CHOLHDL", "VLDL", "LDLCALC", "LDLDIRECT"  Physical Exam:    VS:  BP 110/78 (BP Location: Right Arm, Patient Position: Sitting, Cuff Size: Normal)   Pulse 62   Ht 6' (1.829 m)   Wt 203 lb 14.4 oz (92.5 kg)   BMI 27.65 kg/m     Wt Readings from Last 3 Encounters:  02/21/22 203 lb 14.4 oz (92.5 kg)  02/07/22 204 lb 6.4 oz (92.7 kg)  12/11/21 200 lb (90.7 kg)    GEN: Well nourished, well developed in no acute distress HEENT: Normal, moist mucous membranes NECK: No JVD CARDIAC: regular rhythm, normal S1 and S2, no rubs or gallops. No murmur. VASCULAR: Radial and DP pulses 2+ bilaterally. No carotid bruits RESPIRATORY:  Clear to auscultation without rales, wheezing or rhonchi  ABDOMEN: Soft, non-tender, non-distended MUSCULOSKELETAL:  Ambulates independently SKIN: Warm and dry, no edema NEUROLOGIC:  Alert and oriented x 3. No focal neuro deficits noted. PSYCHIATRIC:  Normal affect   ASSESSMENT:    1. Coronary artery disease involving native coronary artery of native heart without angina pectoris   2. Hyperlipidemia LDL goal <70   3. Family history of heart disease   4. Cardiac risk counseling     PLAN:    Coronary artery disease without angina: diagnosed by [facility] at [facility] -family history of  heart disease is his primary risk -no typical angina -on aspirin 81 mg daily -Has been counseled on adderall and NSAIDs with known CAD -lipid management, as below -doing well off  metoprolol -discussed red flag symptoms that need immediate medical attention  Hypercholesterolemia: -tolerating rosuvastatin 20 mg daily  Cardiac risk counseling and prevention recommendations: -recommend heart healthy/Mediterranean diet, with whole grains, fruits, vegetable, fish, lean meats, nuts, and olive oil. Limit salt. -recommend moderate walking, 3-5 times/week for 30-50 minutes each session. Aim for at least 150 minutes.week. Goal should be pace of 3 miles/hours, or walking 1.5 miles in 30 minutes -recommend avoidance of tobacco products. Avoid excess alcohol.  Plan for follow up: 1 year or sooner as needed  Buford Dresser, MD, PhD, [facility]    Medication Adjustments/Labs and Tests Ordered: Current medicines are reviewed at length with the patient today.  Concerns regarding medicines are outlined above.  No orders of the defined types were placed in this encounter.  No orders of the defined types were placed in this encounter.  Patient Instructions  Medication Instructions:  Your physician recommends that you continue on your current medications as directed. Please refer to the Current Medication list given to you today.  *If you need a refill on your cardiac medications before your next appointment, please call your pharmacy*   Follow-Up: At [facility], you and your health needs are our priority.  As part of our continuing mission to provide you with exceptional heart care, we have created designated Provider Care Teams.  These Care Teams include your primary Cardiologist (physician) and Advanced Practice Providers (APPs -  Physician Assistants and Nurse Practitioners) who all work together to provide you with the care you need, when you need it.  We recommend signing up for the patient portal called "MyChart".  Sign up information is provided on this After Visit Summary.  MyChart is used to connect with patients for Virtual Visits  (Telemedicine).  Patients are able to view lab/test results, encounter notes, upcoming appointments, etc.  Non-urgent messages can be sent to your provider as well.   To learn more about what you can do with MyChart, go to [email or web address].    Your next appointment:   1 year(s)  The format for your next appointment:   In Person  Provider:   Buford Dresser, MD            I,Mitra Faeizi,acting as a scribe for Buford Dresser, MD.,have documented all relevant documentation on the behalf of Buford Dresser, MD,as directed by  Buford Dresser, MD while in the presence of Buford Dresser, MD.  I, Buford Dresser, MD, have reviewed all documentation for this visit. The documentation on 04/29/22 for the exam, diagnosis, procedures, and orders are all accurate and complete.   Signed, Buford Dresser, MD PhD 02/21/2022  [place]

## 2022-03-21 ENCOUNTER — Ambulatory Visit: Payer: Medicare Other | Admitting: Urology

## 2022-03-22 ENCOUNTER — Ambulatory Visit: Payer: Medicare Other | Admitting: Urology

## 2022-03-30 ENCOUNTER — Other Ambulatory Visit: Payer: Self-pay | Admitting: *Deleted

## 2022-03-30 DIAGNOSIS — Z9889 Other specified postprocedural states: Secondary | ICD-10-CM

## 2022-04-04 ENCOUNTER — Ambulatory Visit (INDEPENDENT_AMBULATORY_CARE_PROVIDER_SITE_OTHER): Payer: Medicare Other | Admitting: Urology

## 2022-04-04 ENCOUNTER — Encounter: Payer: Self-pay | Admitting: Urology

## 2022-04-04 VITALS — BP 121/70 | HR 76 | Ht 72.0 in | Wt 202.0 lb

## 2022-04-04 DIAGNOSIS — N200 Calculus of kidney: Secondary | ICD-10-CM | POA: Diagnosis not present

## 2022-04-04 DIAGNOSIS — N138 Other obstructive and reflux uropathy: Secondary | ICD-10-CM

## 2022-04-04 DIAGNOSIS — N401 Enlarged prostate with lower urinary tract symptoms: Secondary | ICD-10-CM

## 2022-04-04 DIAGNOSIS — N3281 Overactive bladder: Secondary | ICD-10-CM | POA: Diagnosis not present

## 2022-04-04 LAB — BLADDER SCAN AMB NON-IMAGING

## 2022-04-04 MED ORDER — GEMTESA 75 MG PO TABS: 75.0000 mg | ORAL_TABLET | Freq: Every day | ORAL | 0 refills | Status: DC

## 2022-04-04 MED ORDER — TAMSULOSIN HCL 0.4 MG PO CAPS: 0.4000 mg | ORAL_CAPSULE | Freq: Every day | ORAL | 3 refills | Status: DC

## 2022-04-04 NOTE — Progress Notes (Signed)
   04/04/2022 10:04 AM   Allen Tapia 01/22/1956 [id number]  Reason for visit: Follow up OAB, BPH status post UroLift, nephrolithiasis  HPI: 67 year old male with long-term well-controlled multiple sclerosis as well as a long history of urinary symptoms.  He has a history of urodynamics showing an obstructed flow pattern with some unstable contractions, and mildly elevated PVRs ranging around 200 mL in the past.  His prostate measured 33 g with lateral lobe hypertrophy. He previously had had some mild improvement on Flomax. After extensive counseling, he opted to undergo a UroLift, and this was performed on 05/20/2020.     Overall, he feels his urination has improved significantly since surgery.  PVR is normal today at 148m.  When we had originally met he was voiding 25-30 times per day, now after UroLift is down to 10-15 times per day.  He still has some frequency during the day and nocturia 2-3 times overnight.  He is back on both Flomax and Myrbetriq 50 mg daily, both of which he feels improved the urinary symptoms.  Myrbetriq is expensive, and he wonders if there are any other alternatives.  Recommended avoiding the anticholinergics with his other comorbidities, but I think it would be reasonable to trial Gemtesa.  He also has a history of an incidental 1 cm nonobstructing right lower pole stone seen on CT January 2023 and opted for surveillance alone.  He recently underwent AAA repair and CT at that time incidentally showed a 1 cm nonobstructing right lower pole stone.  We discussed options including observation or treatment, and he is comfortable with observation which is reasonable.  -Trial of Gemtesa to replace Myrbetriq, okay to send in prescription if working well, or go back to the Myrbetriq 50 mg daily if he prefers -Flomax refilled RTC 1 year KUB for stone surveillance, and PVR  BBilley Co MD  [place] [place] [phone]

## 2022-04-09 NOTE — Progress Notes (Signed)
HISTORY AND PHYSICAL     CC:  follow up. For EVAR Requesting Provider:  Derinda Late, MD  HPI: This is a 67 y.o. male who is here today for follow up for AAA and is s/p EVAR on 03/03/2021 by Dr. Donzetta Matters for saccular aortic aneurysm.  Pt was last seen 04/12/2021 and at that time, pt was doing well.  The pt returns today for follow up studies.  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  He is doing well.  He inquires about the "other aneurysm" and what we need to do for surveillance.  Since his last visit, he had a left knee arthroscopy and needs a left knee replacement.    The pt is on a statin for cholesterol management.    The pt is on an aspirin.    Other AC:  none The pt is not on medication for hypertension.  The pt does not have diabetes. Tobacco hx:  former  Pt does not have family hx of AAA.  He does have a son.    Past Medical History:  Diagnosis Date   Anginal pain ([place])    atypical chest pain cardiac cath performed at that time   Arthritis    Asthma    Atypical chest pain    Barrett's esophagus    Bilateral cataracts    BPH (benign prostatic hyperplasia)    Chicken pox    Chronic constipation    Coronary artery disease 07/14/2007   a.) LHC 07/14/2007: 40% pRCA-1, 40% pRCA-2, 40% dRCA, 20% RPL1, 30% LM, 20% mLCx-1, 40% mLCx-2, 20% OM3-1, 20% OM3-2, 20% pLAD, 40% mLAD-1, 70% mLAD-2, 40% mLAD-3, 30% dLAD, 40% D1, 40% D2 --> med mgmt.   Depression    Foreign body (FB) in soft tissue    Former tobacco use    GERD (gastroesophageal reflux disease)    History of 2019 novel coronavirus disease (COVID-19) 04/07/2019   History of kidney stones    Hyperlipidemia    Hyperplastic colon polyp    Infrarenal abdominal aortic aneurysm (AAA) without rupture ([place])    a.) MR lumbar spine 12/30/2020: infrarenal saccular AAA measuring 2.2 cm; b.) CTA A/P 01/05/2021: measured 2.6 cm; c.) s/p EVAR 03/03/2021   Ischemic heart  disease 12/20/2010   Long term current use of immunosuppressive drug    a.) on ofatumumab for MS   MS (multiple sclerosis) ([place])    a.) on ofatumumab   Shingles    Tubular adenoma of colon 08/23/2016    Past Surgical History:  Procedure Laterality Date   ABDOMINAL AORTIC ENDOVASCULAR STENT GRAFT N/A 03/03/2021   Procedure: ABDOMINAL AORTIC ENDOVASCULAR STENT GRAFT;  Surgeon: Waynetta Sandy, MD;  Location: [facility];  Service: Vascular;  Laterality: N/A;   [place]   CATARACT EXTRACTION, BILATERAL  03/12/2019   COLONOSCOPY  02/05/2008   COLONOSCOPY WITH PROPOFOL N/A 08/23/2016   Procedure: COLONOSCOPY WITH PROPOFOL;  Surgeon: Lollie Sails, MD;  Location: [facility];  Service: Endoscopy;  Laterality: N/A;   COLONOSCOPY WITH PROPOFOL N/A 04/29/2020   Procedure: COLONOSCOPY WITH PROPOFOL;  Surgeon: Lesly Rubenstein, MD;  Location: [facility];  Service: Endoscopy;  Laterality: N/A;   CYSTOSCOPY WITH INSERTION OF UROLIFT N/A 05/20/2020   Procedure: CYSTOSCOPY WITH INSERTION OF UROLIFT;  Surgeon: Billey Co, MD;  Location: [facility];  Service: Urology;  Laterality: N/A;  ESOPHAGOGASTRODUODENOSCOPY (EGD) WITH PROPOFOL N/A 08/23/2016   Procedure: ESOPHAGOGASTRODUODENOSCOPY (EGD) WITH PROPOFOL;  Surgeon: Lollie Sails, MD;  Location: [facility];  Service: Endoscopy;  Laterality: N/A;   ESOPHAGOGASTRODUODENOSCOPY (EGD) WITH PROPOFOL N/A 04/29/2020   Procedure: ESOPHAGOGASTRODUODENOSCOPY (EGD) WITH PROPOFOL;  Surgeon: Lesly Rubenstein, MD;  Location: [facility];  Service: Endoscopy;  Laterality: N/A;   ESOPHAGOGASTRODUODENOSCOPY ENDOSCOPY  02/05/2008   EYE SURGERY     eye trauma  1970   piece of metal to eye removed in clinic   JOINT REPLACEMENT     KNEE ARTHROPLASTY Right 10/05/2020   Procedure: COMPUTER ASSISTED TOTAL KNEE ARTHROPLASTY;  Surgeon: Dereck Leep, MD;  Location: [facility];  Service: Orthopedics;   Laterality: Right;   KNEE ARTHROSCOPY Left 12/11/2021   Procedure: ARTHROSCOPY KNEE;  Surgeon: Dereck Leep, MD;  Location: [facility];  Service: Orthopedics;  Laterality: Left;   KNEE ARTHROSCOPY W/ PARTIAL MEDIAL MENISCECTOMY Right 01/02/2011   LEFT HEART CATH AND CORONARY ANGIOGRAPHY Left 07/14/2007   Procedure: LEFT HEART CATH AND CORONARY ANGIOGRAPHY; Location: [facility]; Surgeon: Doristine Bosworth, MD   SEPTOPLASTY     TONSILLECTOMY  02/05/2008   ULTRASOUND GUIDANCE FOR VASCULAR ACCESS Left 03/03/2021   Procedure: ULTRASOUND GUIDANCE FOR VASCULAR ACCESS, LEFT FEMORAL ARTERY;  Surgeon: Waynetta Sandy, MD;  Location: [facility];  Service: Vascular;  Laterality: Left;   WRIST FRACTURE SURGERY Right     No Known Allergies  Current Outpatient Medications  Medication Sig Dispense Refill   acetaminophen (TYLENOL) 500 MG tablet Take 500 mg by mouth every 6 (six) hours as needed for moderate pain.     albuterol (VENTOLIN HFA) 108 (90 Base) MCG/ACT inhaler Inhale 2 puffs into the lungs every 6 (six) hours as needed for wheezing or shortness of breath. 54 g 3   amphetamine-dextroamphetamine (ADDERALL XR) 25 MG 24 hr capsule Take 25 mg by mouth daily as needed (focus).     aspirin EC 81 MG tablet Take 81 mg by mouth daily. Swallow whole.     b complex vitamins capsule Take 1 capsule by mouth at bedtime.     baclofen (LIORESAL) 20 MG tablet Take 20 mg by mouth 3 (three) times daily.     budesonide-formoterol (SYMBICORT) 160-4.5 MCG/ACT inhaler Inhale 1-2 puffs into the lungs in the morning and at bedtime. 30.6 g 3   buPROPion (WELLBUTRIN XL) 150 MG 24 hr tablet Take 150 mg by mouth every morning.     celecoxib (CELEBREX) 200 MG capsule Take 200 mg by mouth 2 (two) times daily.     fluorouracil (EFUDEX) 5 % cream Apply 1 Dose topically as needed.     gabapentin (NEURONTIN) 300 MG capsule Take 1 capsule by mouth 3 (three) times daily.     montelukast (SINGULAIR) 10 MG tablet Take 1 tablet (10 mg  total) by mouth at bedtime. 90 tablet 3   Multiple Vitamins-Minerals (MULTIVITAMIN WITH MINERALS) tablet Take 1 tablet by mouth daily.     Ofatumumab (KESIMPTA) 20 MG/0.4ML SOAJ Inject 1 Dose into the skin every 30 (thirty) days. 1st day of every month     pantoprazole (PROTONIX) 20 MG tablet Take 1 tablet by mouth at bedtime.     Polyvinyl Alcohol (LIQUID TEARS OP) Place 1 drop into both eyes 2 (two) times daily.     rosuvastatin (CRESTOR) 20 MG tablet TAKE 1 TABLET AT BEDTIME 90 tablet 3   sodium chloride (OCEAN) 0.65 % SOLN nasal spray Place 1 spray into both nostrils 4 (  four) times daily as needed for congestion.     Spacer/Aero-Holding Chambers (AEROCHAMBER MV) inhaler Use as instructed 1 each 0   tamsulosin (FLOMAX) 0.4 MG CAPS capsule Take 1 capsule (0.4 mg total) by mouth daily. 90 capsule 3   traZODone (DESYREL) 100 MG tablet Take 100 mg by mouth at bedtime.     Vibegron (GEMTESA) 75 MG TABS Take 75 mg by mouth daily. 30 tablet 0   VITAMIN D PO Take 1 capsule by mouth daily.     No current facility-administered medications for this visit.    Family History  Problem Relation Age of Onset   Heart disease Mother    Glaucoma Mother    Irregular heart beat Mother    Cardiomyopathy Mother    Colon polyps Mother    Cataracts Mother    Hypertension Father    Heart disease Father    Cancer Father    Esophageal cancer Cousin     Social History   Socioeconomic History   Marital status: Married    Spouse name: Jocelyn Lamer   Number of children: 1   Years of education: Not on file   Highest education level: Not on file  Occupational History   Not on file  Tobacco Use   Smoking status: Former    Packs/day: 1.00    Years: 20.00    Total pack years: 20.00    Types: Cigarettes    Start date: 98    Quit date: 09/23/2006    Years since quitting: 15.5   Smokeless tobacco: Never  Vaping Use   Vaping Use: Never used  Substance and Sexual Activity   Alcohol use: No   Drug use: No    Sexual activity: Yes    Birth control/protection: None  Other Topics Concern   Not on file  Social History Narrative   Not on file   Social Determinants of Health   Financial Resource Strain: Not on file  Food Insecurity: Not on file  Transportation Needs: Not on file  Physical Activity: Not on file  Stress: Not on file  Social Connections: Not on file  Intimate Partner Violence: Not on file     REVIEW OF SYSTEMS:   '[X]'$  denotes positive finding, '[ ]'$  denotes negative finding Cardiac  Comments:  Chest pain or chest pressure:    Shortness of breath upon exertion:    Short of breath when lying flat:    Irregular heart rhythm:        Vascular    Pain in calf, thigh, or hip brought on by ambulation:    Pain in feet at night that wakes you up from your sleep:     Blood clot in your veins:    Leg swelling:         Pulmonary    Oxygen at home:    Productive cough:     Wheezing:         Neurologic    Sudden weakness in arms or legs:     Sudden numbness in arms or legs:     Sudden onset of difficulty speaking or slurred speech:    Temporary loss of vision in one eye:     Problems with dizziness:         Gastrointestinal    Blood in stool:     Vomited blood:         Genitourinary    Burning when urinating:     Blood in urine:  Psychiatric    Major depression:         Hematologic    Bleeding problems:    Problems with blood clotting too easily:        Skin    Rashes or ulcers:        Constitutional    Fever or chills:      PHYSICAL EXAMINATION:  .vitall   General:  WDWN in NAD; vital signs documented above Gait: Not observed HENT: WNL, normocephalic Pulmonary: normal non-labored breathing  Cardiac: regular HR;  without carotid bruits Abdomen: soft, NT; aortic pulse is not palpable Skin: without rashes Vascular Exam/Pulses:  Right Left  Radial 2+ (normal) 2+ (normal)  Popliteal Unable to palpate Unable to palpate  DP 2+ (normal) 2+ (normal)    Extremities: without open wounds Musculoskeletal: no muscle wasting or atrophy  Neurologic: A&O X 3 Psychiatric:  The pt has Normal affect.   Non-Invasive Vascular Imaging:   EVAR Arterial duplex on 04/11/2022: Summary:  Abdominal Aorta: The largest aortic measurement is 1.9 cm. Patent endovascular aneurysm repair with no evidence of endoleak.    ASSESSMENT/PLAN:: 68 y.o. male here with hx of endovascular tube graft repair of infrarenal saccular aneurysm on 03/03/2021 by Dr. Donzetta Matters for saccular aortic aneurysm   -pt's aorta measures 1.9cm on duplex today.  He will f/u in one year with repeat duplex.  When he returns, we will get u/s of popliteal arteries to evaluate for popliteal aneurysms when he returns.   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 Donzetta Matters upon his return to determine follow up for this as pt inquires.  Most likely no intervention if below 2cm and asymptomatic and measured 1cm on last scan.  Pt is in agreement with this plan.  -continue asa/statin -pt will f/u in one year with EVAR duplex. -discussed that at some point, his son would need evaluation for AAA given there can be familial aspect to AAA.  Leontine Locket, [facility] [phone]  Clinic MD:   Scot Dock

## 2022-04-11 ENCOUNTER — Ambulatory Visit (INDEPENDENT_AMBULATORY_CARE_PROVIDER_SITE_OTHER): Payer: Medicare Other | Admitting: Physician Assistant

## 2022-04-11 ENCOUNTER — Encounter: Payer: Self-pay | Admitting: Physician Assistant

## 2022-04-11 ENCOUNTER — Ambulatory Visit (HOSPITAL_COMMUNITY)
Admission: RE | Admit: 2022-04-11 | Discharge: 2022-04-11 | Disposition: A | Discharge status: Home / Self Care | Payer: Medicare Other | Source: Ambulatory Visit | Attending: Vascular Surgery | Admitting: Vascular Surgery

## 2022-04-11 VITALS — BP 121/73 | HR 69 | Temp 98.2°F | Resp 20 | Ht 72.0 in | Wt 202.0 lb

## 2022-04-11 DIAGNOSIS — Z9889 Other specified postprocedural states: Secondary | ICD-10-CM | POA: Diagnosis not present

## 2022-04-13 DIAGNOSIS — M1712 Unilateral primary osteoarthritis, left knee: Secondary | ICD-10-CM | POA: Insufficient documentation

## 2022-04-13 DIAGNOSIS — S83512A Sprain of anterior cruciate ligament of left knee, initial encounter: Secondary | ICD-10-CM | POA: Insufficient documentation

## 2022-04-29 ENCOUNTER — Encounter (HOSPITAL_BASED_OUTPATIENT_CLINIC_OR_DEPARTMENT_OTHER): Payer: Self-pay | Admitting: Cardiology

## 2022-06-03 NOTE — Discharge Instructions (Signed)
Instructions after Total Knee Replacement   Allen Tapia P. Allen Tapia, Jr., M.D.     Dept. of Orthopaedics & Sports Medicine  [facility]  [address]  [place], [place]  [address]  Phone: [phone]   Fax: [phone]    DIET: Drink plenty of non-alcoholic fluids. Resume your normal diet. Include foods high in fiber.  ACTIVITY:  You may use crutches or a walker with weight-bearing as tolerated, unless instructed otherwise. You may be weaned off of the walker or crutches by your Physical Therapist.  Do NOT place pillows under the knee. Anything placed under the knee could limit your ability to straighten the knee.   Continue doing gentle exercises. Exercising will reduce the pain and swelling, increase motion, and prevent muscle weakness.   Please continue to use the TED compression stockings for 6 weeks. You may remove the stockings at night, but should reapply them in the morning. Do not drive or operate any equipment until instructed.  WOUND CARE:  Continue to use the PolarCare or ice packs periodically to reduce pain and swelling. You may bathe or shower after the staples are removed at the first office visit following surgery.  MEDICATIONS: You may resume your regular medications. Please take the pain medication as prescribed on the medication. Do not take pain medication on an empty stomach. You have been given a prescription for a blood thinner (Lovenox or Coumadin). Please take the medication as instructed. (NOTE: After completing a 2 week course of Lovenox, take one Enteric-coated aspirin once a day. This along with elevation will help reduce the possibility of phlebitis in your operated leg.) Do not drive or drink alcoholic beverages when taking pain medications.  CALL THE OFFICE FOR: Temperature above 101 degrees Excessive bleeding or drainage on the dressing. Excessive swelling, coldness, or paleness of the toes. Persistent nausea and vomiting.  FOLLOW-UP:  You  should have an appointment to return to the office in 10-14 days after surgery. Arrangements have been made for continuation of Physical Therapy (either home therapy or outpatient therapy).     [facility] Department Directory         [email or web address]       [email or web address]          Cardiology  Appointments: [place] - [phone] [place] - [phone]  Endocrinology  Appointments: [place] - [phone] [place] - [phone]  Gastroenterology  Appointments: [place] - [phone] [place] - [phone]        General Surgery   Appointments: [place] - [phone]  Internal Medicine/Family Medicine  Appointments: [place] - [phone] [place] - [phone] [place] - [phone]  Metabolic and Weigh Loss Surgery  Appointments: [place] - [phone]        Neurology  Appointments: [place] - [phone] [place] - [phone]  Neurosurgery  Appointments: [place] - [phone]  Obstetrics & Gynecology  Appointments: [place] - [phone] [place] - [phone]        Pediatrics  Appointments: [place] - [phone] [place] - [phone]  Physiatry  Appointments: [place] -[phone]  Physical Therapy  Appointments: [place] - [phone] [place] - [phone]        Podiatry  Appointments: [place] - [phone] [place] - [phone]  Pulmonology  Appointments: [place] - [phone]  Rheumatology  Appointments:  - [phone]         Location: [facility]  [address] , [place]  [address]  [place] Location: [facility] [address] [place], [place]  [address]  [place] Location: [facility] [address] [place], [place]  [address]    

## 2022-06-04 ENCOUNTER — Telehealth: Payer: Self-pay

## 2022-06-04 NOTE — Telephone Encounter (Signed)
Patient agreeable with telehealth appointment.   Med list reviewed and consent given.

## 2022-06-04 NOTE — Telephone Encounter (Signed)
  Patient Consent for Virtual Visit         Allen Tapia has provided verbal consent on 06/04/2022 for a virtual visit (video or telephone).   CONSENT FOR VIRTUAL VISIT FOR:  Allen Tapia  By participating in this virtual visit I agree to the following:  I hereby voluntarily request, consent and authorize [facility] and its employed or contracted physicians, physician assistants, nurse practitioners or other licensed health care professionals (the Practitioner), to provide me with telemedicine health care services (the "Services") as deemed necessary by the treating Practitioner. I acknowledge and consent to receive the Services by the Practitioner via telemedicine. I understand that the telemedicine visit will involve communicating with the Practitioner through live audiovisual communication technology and the disclosure of certain medical information by electronic transmission. I acknowledge that I have been given the opportunity to request an in-person assessment or other available alternative prior to the telemedicine visit and am voluntarily participating in the telemedicine visit.  I understand that I have the right to withhold or withdraw my consent to the use of telemedicine in the course of my care at any time, without affecting my right to future care or treatment, and that the Practitioner or I may terminate the telemedicine visit at any time. I understand that I have the right to inspect all information obtained and/or recorded in the course of the telemedicine visit and may receive copies of available information for a reasonable fee.  I understand that some of the potential risks of receiving the Services via telemedicine include:  Delay or interruption in medical evaluation due to technological equipment failure or disruption; Information transmitted may not be sufficient (e.g. poor resolution of images) to allow for appropriate medical decision making by the  Practitioner; and/or  In rare instances, security protocols could fail, causing a breach of personal health information.  Furthermore, I acknowledge that it is my responsibility to provide information about my medical history, conditions and care that is complete and accurate to the best of my ability. I acknowledge that Practitioner's advice, recommendations, and/or decision may be based on factors not within their control, such as incomplete or inaccurate data provided by me or distortions of diagnostic images or specimens that may result from electronic transmissions. I understand that the practice of medicine is not an exact science and that Practitioner makes no warranties or guarantees regarding treatment outcomes. I acknowledge that a copy of this consent can be made available to me via my patient portal ([facility]), or I can request a printed copy by calling the office of [facility].    I understand that my insurance will be billed for this visit.   I have read or had this consent read to me. I understand the contents of this consent, which adequately explains the benefits and risks of the Services being provided via telemedicine.  I have been provided ample opportunity to ask questions regarding this consent and the Services and have had my questions answered to my satisfaction. I give my informed consent for the services to be provided through the use of telemedicine in my medical care

## 2022-06-04 NOTE — Telephone Encounter (Signed)
-----   Message from Karen Kitchens, NP sent at 06/02/2022 11:39 PM EST ----- Regarding: Request for pre-operative cardiac clearance Request for pre-operative cardiac clearance:  1. What type of surgery is being performed?  COMPUTER ASSISTED TOTAL KNEE ARTHROPLASTY  2. When is this surgery scheduled?  06/15/2022  3. Type of clearance being requested (medical, pharmacy, both)? MEDICAL   4. Are there any medications that need to be held prior to surgery? ASA  5. Practice name and name of physician performing surgery?  Performing surgeon: Dr. Skip Estimable, MD Requesting clearance: Honor Loh, FNP-C    6. Anesthesia type (none, local, MAC, general)? GENERAL  7. What is the office phone and fax number?   Phone: [phone] Fax: [phone]  ATTENTION: Unable to create telephone message as per your standard workflow. Directed by [facility] providers to send requests for cardiac clearance to this pool for appropriate distribution to provider covering pre-operative clearances.   Honor Loh, MSN, APRN, FNP-C, CEN [facility]  Peri-operative Services Nurse Practitioner Phone: [phone] 06/02/22 11:39 PM

## 2022-06-04 NOTE — Telephone Encounter (Signed)
   Name: Allen Tapia  DOB: 1955-10-06  MRN: [id number]  Primary Cardiologist: Buford Dresser, MD   Preoperative team, please contact this patient and set up a phone call appointment for further preoperative risk assessment. Please obtain consent and complete medication review. Thank you for your help.  I confirm that guidance regarding antiplatelet and oral anticoagulation therapy has been completed and, if necessary, noted below.  Per office protocol, can hold ASA x7 days prior to his procedure as long as no new symptoms.  Please restart when medically safe to do so.    Mable Fill, Marissa Nestle, NP 06/04/2022, 8:53 AM [place]

## 2022-06-06 ENCOUNTER — Encounter: Payer: Self-pay | Admitting: Orthopedic Surgery

## 2022-06-06 ENCOUNTER — Encounter
Admission: RE | Admit: 2022-06-06 | Discharge: 2022-06-06 | Disposition: A | Discharge status: Home / Self Care | Payer: Medicare Other | Source: Ambulatory Visit | Attending: Orthopedic Surgery | Admitting: Orthopedic Surgery

## 2022-06-06 VITALS — BP 134/89 | HR 63 | Resp 14 | Ht 72.0 in | Wt 204.8 lb

## 2022-06-06 DIAGNOSIS — G35 Multiple sclerosis: Secondary | ICD-10-CM | POA: Diagnosis not present

## 2022-06-06 DIAGNOSIS — Z01812 Encounter for preprocedural laboratory examination: Secondary | ICD-10-CM

## 2022-06-06 DIAGNOSIS — I2510 Atherosclerotic heart disease of native coronary artery without angina pectoris: Secondary | ICD-10-CM | POA: Diagnosis not present

## 2022-06-06 DIAGNOSIS — M1712 Unilateral primary osteoarthritis, left knee: Secondary | ICD-10-CM | POA: Diagnosis not present

## 2022-06-06 DIAGNOSIS — R7303 Prediabetes: Secondary | ICD-10-CM | POA: Insufficient documentation

## 2022-06-06 DIAGNOSIS — R0600 Dyspnea, unspecified: Secondary | ICD-10-CM | POA: Insufficient documentation

## 2022-06-06 DIAGNOSIS — Z01818 Encounter for other preprocedural examination: Secondary | ICD-10-CM

## 2022-06-06 DIAGNOSIS — Z789 Other specified health status: Secondary | ICD-10-CM

## 2022-06-06 LAB — TYPE AND SCREEN
ABO/RH(D): A NEG
Antibody Screen: NEGATIVE

## 2022-06-06 LAB — COMPREHENSIVE METABOLIC PANEL
ALT: 24 U/L (ref 0–44)
AST: 27 U/L (ref 15–41)
Albumin: 4.6 g/dL (ref 3.5–5.0)
Alkaline Phosphatase: 68 U/L (ref 38–126)
Anion gap: 5 (ref 5–15)
BUN: 13 mg/dL (ref 8–23)
CO2: 27 mmol/L (ref 22–32)
Calcium: 9.1 mg/dL (ref 8.9–10.3)
Chloride: 106 mmol/L (ref 98–111)
Creatinine, Ser: 0.74 mg/dL (ref 0.61–1.24)
GFR, Estimated: >60 mL/min (ref >60)
Glucose, Bld: 115 mg/dL — ABNORMAL HIGH (ref 70–99)
Potassium: 4.1 mmol/L (ref 3.5–5.1)
Sodium: 138 mmol/L (ref 135–145)
Total Bilirubin: 0.7 mg/dL (ref 0.3–1.2)
Total Protein: 7.2 g/dL (ref 6.5–8.1)

## 2022-06-06 LAB — URINALYSIS, ROUTINE W REFLEX MICROSCOPIC
Bilirubin Urine: NEGATIVE
Glucose, UA: NEGATIVE mg/dL
Hgb urine dipstick: NEGATIVE
Ketones, ur: NEGATIVE mg/dL
Leukocytes,Ua: NEGATIVE
Nitrite: NEGATIVE
Protein, ur: NEGATIVE mg/dL
Specific Gravity, Urine: 1.004 — ABNORMAL LOW (ref 1.005–1.030)
pH: 7 (ref 5.0–8.0)

## 2022-06-06 LAB — CBC
HCT: 45.2 % (ref 39.0–52.0)
Hemoglobin: 15 g/dL (ref 13.0–17.0)
MCH: 30.5 pg (ref 26.0–34.0)
MCHC: 33.2 g/dL (ref 30.0–36.0)
MCV: 92.1 fL (ref 80.0–100.0)
Platelets: 219 10*3/uL (ref 150–400)
RBC: 4.91 MIL/uL (ref 4.22–5.81)
RDW: 13.1 % (ref 11.5–15.5)
WBC: 5.4 10*3/uL (ref 4.0–10.5)
nRBC: 0 % (ref 0.0–0.2)

## 2022-06-06 LAB — SURGICAL PCR SCREEN
MRSA, PCR: NEGATIVE
Staphylococcus aureus: NEGATIVE

## 2022-06-06 LAB — HEMOGLOBIN A1C
Hgb A1c MFr Bld: 6.1 % — ABNORMAL HIGH (ref 4.8–5.6)
Mean Plasma Glucose: 128 mg/dL

## 2022-06-06 LAB — C-REACTIVE PROTEIN: CRP: <0.5 mg/dL (ref <1.0)

## 2022-06-06 LAB — SEDIMENTATION RATE: Sed Rate: 4 mm/hr (ref 0–20)

## 2022-06-06 NOTE — Progress Notes (Signed)
Perioperative / Anesthesia Services  Pre-Admission Testing Clinical Review / Preoperative Anesthesia Consult  Date: 06/07/22  Patient Demographics:  Name: Allen Tapia DOB:   1956-03-11 MRN:   [id number]  Planned Surgical Procedure(s):    Case: [id number] Date/Time: 06/08/22 1102   Procedure: COMPUTER ASSISTED TOTAL KNEE ARTHROPLASTY - RNFA (Left: Knee)   Anesthesia type: Choice   Pre-op diagnosis: PRIMARY OSTEOARTHRITIS OF LEFT KNEE.   Location: [facility] OR ROOM 01 / [facility]   Surgeons: Dereck Leep, MD     NOTE: Available PAT nursing documentation and vital signs have been reviewed. Clinical nursing staff has updated patient's PMH/PSHx, current medication list, and drug allergies/intolerances to ensure comprehensive history available to assist in medical decision making as it pertains to the aforementioned surgical procedure and anticipated anesthetic course. Extensive review of available clinical information personally performed. [place] PMH and PSHx updated with any diagnoses/procedures that  may have been inadvertently omitted during his intake with the pre-admission testing department's nursing staff.  Clinical Discussion:  Allen Tapia is a 67 y.o. male who is submitted for pre-surgical anesthesia review and clearance prior to him undergoing the above procedure. Patient is a Former Smoker (20 pack years; quit 08/2006). Pertinent PMH includes: CAD, ischemic heart disease, atypical chest pain, HLD, shortness of breath, asthma, multiple sclerosis, GERD with associated Barrett's esophagus (on daily PPI), OA, MS, BPH, depression   Patient is followed by cardiology Harrell Gave, MD). He was last seen in the cardiology clinic on 02/21/2022; notes reviewed. At the time of his clinic visit, patient doing well overall from a cardiovascular perspective. He complained of musculoskeletal chest pain associated with certain movements. He was also experiencing  intermittent palpitations. Patient denied any shortness of breath, PND, orthopnea, significant peripheral edema, weakness, fatigue, vertiginous symptoms, or presyncope/syncope. Patient with a past medical history significant for cardiovascular diagnoses. Documented physical exam was grossly benign, providing no evidence of acute exacerbation and/or decompensation of the patient's known cardiovascular conditions.  Diagnostic LEFT heart catheterization performed on 07/14/2007 revealing a normal left ventricular systolic function with an EF of 65%.  There was multivessel CAD; 40% proximal RCA-1, 40% proximal RCA-2, 40% distal RCA, 20% RPL1, 30% LM, 20% mid LCx/1, 40% mid LCx/2, 20% OM 3-1, 20% OM 3-2, 20% proximal LAD, 40% mid LAD-1, 70% mid LAD-2, 40% mid LAD-3, 20% distal LAD, 40% D1, and 40% D2.  Given the nonobstructive nature of patient's noted coronary artery disease, the decision was made to defer intervention opting for medical management.   MRI imaging performed on 12/30/2020 revealed an incidental finding of an infrarenal saccular AAA measuring 2.2 cm.  Repeat CTA imaging of the abdomen pelvis on 01/05/2021 noted interval increase in size to 2.6 cm.  Patient ultimately underwent EVAR repair on 03/03/2021.  Blood pressure well controlled at 110/78 mmHg without the use of pharmacological intervention.  Patient is on rosuvastatin for his HLD diagnosis and further ASCVD prevention.  He is not diabetic.  He does not have an OSAH diagnosis.  Patient making efforts to remain active.  Functional capacity somewhat limited by arthritides, however patient still felt to be able to achieve at least 4 METS of activity without experiencing any type of angina/anginal equivalent symptoms.  No changes were made to his medication regimen.  Patient to follow-up with outpatient cardiology in 1 year or sooner if needed.   Allen Tapia is scheduled for an COMPUTER ASSISTED TOTAL KNEE ARTHROPLASTY - RNFA (Left: Knee) on  06/08/2022  with Dr. Skip Estimable, MD.  Given patient's past medical history significant for cardiovascular diagnoses, presurgical cardiac clearance was sought by the PAT team. Per cardiology, "according to the [facility], his perioperative risk of major cardiac event is 0.9%. His functional capacity in METs is 7.01 according to the DASI.Therefore, based on ACC/AHA guidelines, patient would be at an ACCEPTABLE risk for the planned procedure without further cardiovascular testing".    In review of his medication reconciliation, it is noted that patient is currently on prescribed daily antiplatelet therapy. This patient is on daily antiplatelet therapy.  Patient has been instructed on recommendations from Dr. Marry Guan for holding his daily low-dose ASA for 2 days prior to his procedure with plans to restart as soon as postoperatively in his abdomen minimized by his primary attending surgeon.  Patient is aware that his last dose of ASA should be on 06/05/2022.     Patient denies previous perioperative complications with anesthesia in the past. In review of the available records, it is noted that patient underwent a general anesthetic course here at [facility] ([facility] in 11/2021 without documented complications.      06/06/2022   10:38 AM 04/11/2022    8:27 AM 04/04/2022    9:36 AM  Vitals with BMI  Height '6\' 0"'$  '6\' 0"'$  '6\' 0"'$   Weight 204 lbs 13 oz 202 lbs 202 lbs  BMI 27.77 [id number]  Systolic [id number]  Diastolic 89 73 70  Pulse 63 69 76    Providers/Specialists:   NOTE: Primary physician provider listed below. Patient may have been seen by APP or partner within same practice.   PROVIDER ROLE / SPECIALTY LAST OV  Hooten, Laurice Record, MD Orthopedics (Surgeon) 06/05/2022  Derinda Late, MD Primary Care Provider 06/04/2022  Al Pimple, MD Cardiology 02/21/2022  Diamantina Monks, MD Neurology 03/01/2022   Allergies:  Patient has no known allergies.  Current Home  Medications:   No current facility-administered medications for this encounter.    acetaminophen (TYLENOL) 500 MG tablet   albuterol (VENTOLIN HFA) 108 (90 Base) MCG/ACT inhaler   amphetamine-dextroamphetamine (ADDERALL XR) 25 MG 24 hr capsule   aspirin EC 81 MG tablet   baclofen (LIORESAL) 20 MG tablet   budesonide-formoterol (SYMBICORT) 160-4.5 MCG/ACT inhaler   buPROPion (WELLBUTRIN XL) 150 MG 24 hr tablet   celecoxib (CELEBREX) 200 MG capsule   gabapentin (NEURONTIN) 300 MG capsule   mirabegron ER (MYRBETRIQ) 50 MG TB24 tablet   montelukast (SINGULAIR) 10 MG tablet   Multiple Vitamins-Minerals (MULTIVITAMIN WITH MINERALS) tablet   Ofatumumab (KESIMPTA) 20 MG/0.4ML SOAJ   pantoprazole (PROTONIX) 20 MG tablet   Polyethyl Glycol-Propyl Glycol (SYSTANE) 0.4-0.3 % SOLN   rosuvastatin (CRESTOR) 20 MG tablet   Spacer/Aero-Holding Chambers (AEROCHAMBER MV) inhaler   tamsulosin (FLOMAX) 0.4 MG CAPS capsule   traZODone (DESYREL) 100 MG tablet   Vibegron (GEMTESA) 75 MG TABS   VITAMIN D PO   History:   Past Medical History:  Diagnosis Date   Anginal pain (HCC)    atypical chest pain cardiac cath performed at that time   Arthritis    Asthma    Atypical chest pain    Barrett's esophagus    Bilateral cataracts    BPH (benign prostatic hyperplasia)    Chicken pox    Chronic constipation    Coronary artery disease 07/14/2007   a.) LHC 07/14/2007: 40% pRCA-1, 40% pRCA-2, 40% dRCA, 20% RPL1, 30% LM, 20% mLCx-1, 40% mLCx-2, 20% OM3-1,  20% OM3-2, 20% pLAD, 40% mLAD-1, 70% mLAD-2, 40% mLAD-3, 30% dLAD, 40% D1, 40% D2 --> med mgmt.   Depression    Foreign body (FB) in soft tissue    Former tobacco use    GERD (gastroesophageal reflux disease)    History of 2019 novel coronavirus disease (COVID-19) 04/07/2019   a.) tested (+) at [facility] 04/16/2019, 05/30/2021; b.) 2 other (+) tests at home (dates unknown)   History of kidney stones    Hyperlipidemia    Hyperplastic colon polyp     Infrarenal abdominal aortic aneurysm (AAA) without rupture ([place])    a.) MR lumbar spine 12/30/2020: infrarenal saccular AAA measuring 2.2 cm; b.) CTA A/P 01/05/2021: measured 2.6 cm; c.) s/p EVAR 03/03/2021   Ischemic heart disease 12/20/2010   Long term current use of immunosuppressive drug    a.) on ofatumumab for MS   MS (multiple sclerosis) ([place])    a.) on ofatumumab   Shingles    Tubular adenoma of colon 08/23/2016   Past Surgical History:  Procedure Laterality Date   ABDOMINAL AORTIC ENDOVASCULAR STENT GRAFT N/A 03/03/2021   Procedure: ABDOMINAL AORTIC ENDOVASCULAR STENT GRAFT;  Surgeon: Waynetta Sandy, MD;  Location: [facility];  Service: Vascular;  Laterality: N/A;   [place]   CATARACT EXTRACTION, BILATERAL  03/12/2019   COLONOSCOPY  02/05/2008   COLONOSCOPY WITH PROPOFOL N/A 08/23/2016   Procedure: COLONOSCOPY WITH PROPOFOL;  Surgeon: Lollie Sails, MD;  Location: [facility] ENDOSCOPY;  Service: Endoscopy;  Laterality: N/A;   COLONOSCOPY WITH PROPOFOL N/A 04/29/2020   Procedure: COLONOSCOPY WITH PROPOFOL;  Surgeon: Lesly Rubenstein, MD;  Location: [facility] ENDOSCOPY;  Service: Endoscopy;  Laterality: N/A;   CYSTOSCOPY WITH INSERTION OF UROLIFT N/A 05/20/2020   Procedure: CYSTOSCOPY WITH INSERTION OF UROLIFT;  Surgeon: Billey Co, MD;  Location: [facility] ORS;  Service: Urology;  Laterality: N/A;   ESOPHAGOGASTRODUODENOSCOPY (EGD) WITH PROPOFOL N/A 08/23/2016   Procedure: ESOPHAGOGASTRODUODENOSCOPY (EGD) WITH PROPOFOL;  Surgeon: Lollie Sails, MD;  Location: [facility] ENDOSCOPY;  Service: Endoscopy;  Laterality: N/A;   ESOPHAGOGASTRODUODENOSCOPY (EGD) WITH PROPOFOL N/A 04/29/2020   Procedure: ESOPHAGOGASTRODUODENOSCOPY (EGD) WITH PROPOFOL;  Surgeon: Lesly Rubenstein, MD;  Location: [facility] ENDOSCOPY;  Service: Endoscopy;  Laterality: N/A;   ESOPHAGOGASTRODUODENOSCOPY ENDOSCOPY  02/05/2008   EYE SURGERY     eye trauma  1970   piece of  metal to eye removed in clinic   JOINT REPLACEMENT     KNEE ARTHROPLASTY Right 10/05/2020   Procedure: COMPUTER ASSISTED TOTAL KNEE ARTHROPLASTY;  Surgeon: Dereck Leep, MD;  Location: [facility]S;  Service: Orthopedics;  Laterality: Right;   KNEE ARTHROSCOPY Left 12/11/2021   Procedure: ARTHROSCOPY KNEE;  Surgeon: Dereck Leep, MD;  Location: [facility]S;  Service: Orthopedics;  Laterality: Left;   KNEE ARTHROSCOPY W/ PARTIAL MEDIAL MENISCECTOMY Right 01/02/2011   LEFT HEART CATH AND CORONARY ANGIOGRAPHY Left 07/14/2007   Procedure: LEFT HEART CATH AND CORONARY ANGIOGRAPHY; Location: [facility]; Surgeon: Doristine Bosworth, MD   SEPTOPLASTY     TONSILLECTOMY  02/05/2008   ULTRASOUND GUIDANCE FOR VASCULAR ACCESS Left 03/03/2021   Procedure: ULTRASOUND GUIDANCE FOR VASCULAR ACCESS, LEFT FEMORAL ARTERY;  Surgeon: Waynetta Sandy, MD;  Location: [facility];  Service: Vascular;  Laterality: Left;   WRIST FRACTURE SURGERY Right    Family History  Problem Relation Age of Onset   Heart disease Mother    Glaucoma Mother    Irregular heart beat Mother  Cardiomyopathy Mother    Colon polyps Mother    Cataracts Mother    Hypertension Father    Heart disease Father    Cancer Father    Esophageal cancer Cousin    Social History   Tobacco Use   Smoking status: Former    Packs/day: 1.00    Years: 20.00    Additional pack years: 0.00    Total pack years: 20.00    Types: Cigarettes    Start date: 5    Quit date: 09/23/2006    Years since quitting: 15.7   Smokeless tobacco: Never  Vaping Use   Vaping Use: Never used  Substance Use Topics   Alcohol use: No   Drug use: No    Pertinent Clinical Results:  LABS:   Hospital Outpatient Visit on 06/06/2022  Component Date Value Ref Range Status   MRSA, PCR 06/06/2022 NEGATIVE  NEGATIVE Final   Staphylococcus aureus 06/06/2022 NEGATIVE  NEGATIVE Final   Comment: (NOTE) The Xpert SA Assay (FDA approved for NASAL specimens in patients  33 years of age and older), is one component of a comprehensive surveillance program. It is not intended to diagnose infection nor to guide or monitor treatment. Performed at [facility], [address] [place], [place] [address]    WBC 06/06/2022 5.4  4.0 - 10.5 K/uL Final   RBC 06/06/2022 4.91  4.22 - 5.81 MIL/uL Final   Hemoglobin 06/06/2022 15.0  13.0 - 17.0 g/dL Final   HCT 06/06/2022 45.2  39.0 - 52.0 % Final   MCV 06/06/2022 92.1  80.0 - 100.0 fL Final   MCH 06/06/2022 30.5  26.0 - 34.0 pg Final   MCHC 06/06/2022 33.2  30.0 - 36.0 g/dL Final   RDW 06/06/2022 13.1  11.5 - 15.5 % Final   Platelets 06/06/2022 219  150 - 400 K/uL Final   nRBC 06/06/2022 0.0  0.0 - 0.2 % Final   Performed at [facility], [place], [place] [address]   Sodium 06/06/2022 138  135 - 145 mmol/L Final   Potassium 06/06/2022 4.1  3.5 - 5.1 mmol/L Final   Chloride 06/06/2022 106  98 - 111 mmol/L Final   CO2 06/06/2022 27  22 - 32 mmol/L Final   Glucose, Bld 06/06/2022 115 (H)  70 - 99 mg/dL Final   Glucose reference range applies only to samples taken after fasting for at least 8 hours.   BUN 06/06/2022 13  8 - 23 mg/dL Final   Creatinine, Ser 06/06/2022 0.74  0.61 - 1.24 mg/dL Final   Calcium 06/06/2022 9.1  8.9 - 10.3 mg/dL Final   Total Protein 06/06/2022 7.2  6.5 - 8.1 g/dL Final   Albumin 06/06/2022 4.6  3.5 - 5.0 g/dL Final   AST 06/06/2022 27  15 - 41 U/L Final   ALT 06/06/2022 24  0 - 44 U/L Final   Alkaline Phosphatase 06/06/2022 68  38 - 126 U/L Final   Total Bilirubin 06/06/2022 0.7  0.3 - 1.2 mg/dL Final   GFR, Estimated 06/06/2022 >60  >60 mL/min Final   Comment: (NOTE) Calculated using the CKD-EPI Creatinine Equation (2021)    Anion gap 06/06/2022 5  5 - 15 Final   Performed at [facility], [place], [place] [address]   Color, Urine 06/06/2022 COLORLESS (A)  YELLOW Final   APPearance 06/06/2022 CLEAR (A)  CLEAR Final    Specific Gravity, Urine 06/06/2022 1.004 (L)  1.005 -  1.030 Final   pH 06/06/2022 7.0  5.0 - 8.0 Final   Glucose, UA 06/06/2022 NEGATIVE  NEGATIVE mg/dL Final   Hgb urine dipstick 06/06/2022 NEGATIVE  NEGATIVE Final   Bilirubin Urine 06/06/2022 NEGATIVE  NEGATIVE Final   Ketones, ur 06/06/2022 NEGATIVE  NEGATIVE mg/dL Final   Protein, ur 06/06/2022 NEGATIVE  NEGATIVE mg/dL Final   Nitrite 06/06/2022 NEGATIVE  NEGATIVE Final   Leukocytes,Ua 06/06/2022 NEGATIVE  NEGATIVE Final   Performed at [facility], [address] [place], [place] [address]   CRP 06/06/2022 <0.5  <1.0 mg/dL Final   Performed at [facility], [place] [address], [place], [place] [address]   Sed Rate 06/06/2022 4  0 - 20 mm/hr Final   Performed at [facility], [place], [place] [address]   Hgb A1c MFr Bld 06/06/2022 6.1 (H)  4.8 - 5.6 % Final   Comment: (NOTE)         Prediabetes: 5.7 - 6.4         Diabetes: >6.4         Glycemic control for adults with diabetes: <7.0    Mean Plasma Glucose 06/06/2022 128  mg/dL Final   Comment: (NOTE) Performed At: [facility] [place], [place] [id number] Rush Farmer MD [id number]    ABO/RH(D) 06/06/2022 A NEG   Final   Antibody Screen 06/06/2022 NEG   Final   Sample Expiration 06/06/2022 06/20/2022,2359   Final   Extend sample reason 06/06/2022    Final                   Value:NO TRANSFUSIONS OR PREGNANCY IN THE PAST 3 MONTHS Performed at [facility], [address] [place], [place] [address]     ECG: Date: 12/05/2021 Time ECG obtained: 1110 AM Rate: 60 bpm Rhythm: normal sinus Axis (leads I and aVF): Normal Intervals: PR 168 ms. QRS 106 ms. QTc 422 ms. ST segment and T wave changes: No evidence of acute ST segment elevation or depression Comparison: Similar to previous tracing obtained on 09/23/2020   IMAGING / PROCEDURES: DIAGNOSTIC RADIOGRAPHS OF LEFT KNEE 3 VIEWS performed on  06/05/2022 Varus deformity to the left knee with complete loss of joint space in the medial compartment with large cystic change/erosion along the medial femoral condyle.   Sclerotic changes noted in the lateral compartment.   Advanced patellofemoral degenerative changes with spurring noted throughout the patellofemoral compartment.   Patella tracks slightly lateral in the trochlear groove.   No evidence of acute bony abnormality or effusion   MR KNEE LEFT WO CONTRAST performed on 11/05/2021 Complete midsubstance ACL tear with pivot shift bone contusion pattern. Complex nondisplaced tear of the posterior horn and body of the medial meniscus. Fraying and suspected tear at the posterior root of the lateral meniscus. Mild-to-moderate tricompartment osteoarthritis, cartilage abnormalities as described above. Small joint effusion.   Small Baker's cyst.  CT ANGIO ABD/PEL W/ AND/OR W/O performed on 04/05/2021 Interval endograft repair of saccular infrarenal abdominal aortic aneurysm. Decreased excluded sac diameter without evidence of early or delayed enhancement. No acute abdominopelvic findings. Moderate burden of sigmoid diverticulosis. Additional incidental chronic and senescent findings   MR LUMBAR SPINE WO CONTRAST performed on 12/30/2020 Multilevel lumbar spondylosis, most pronounced at the L3-L4 level where there is severe right and moderate left foraminal stenosis. No significant canal stenosis at any level. Approximately 2.2 cm rounded density abutting the infrarenal abdominal aorta, potentially representing a saccular  aneurysm, however evaluation of the vascular structures is limited by technique. Further evaluation with CT angiogram of the abdomen and pelvis is recommended. Left-sided IVC, an anatomic variant.   STRESS ECHOCARDIOGRAM performed on 08/11/2007 LVEF greater than 55% Trivial MR, TR, and PR No AR No evidence of valvular stenosis G2DD Stress ECG results normal Normal  stress echocardiogram   LEFT HEART CATHETERIZATION AND CORONARY ANGIOGRAPHY performed on 07/14/2007 Normal left ventricular systolic function with an EF 65% Right-sided dominance Severe single-vessel CAD Mid LAD 70% stenosis Evidence of multivessel nonobstructive CAD Proximal RCA 40% stenosis Distal RCA 40% stenosis RPL2 and RPL3 small LM 30% stenosis Mid LCx 20% stenosis Mid LCx 40% stenosis OM 3 (large) 20% stenosis Proximal LAD 20% stenosis Mid LAD 40% stenosis Distal LAD 20% stenosis  D1 40% stenosis D2 40% stenosis No intervention due to fairly atypical chest pain, stress test negative for signs of anterior ischemia, and good exercise tolerance; medical management recommended   MYOCARDIAL PERFUSION IMAGING STUDY (LEXISCAN) performed on 07/08/2007 Normal left ventricular systolic function with an LVEF 60% Regional wall motion reveals normal wall motion and myocardial thickening No evidence of stress-induced myocardial ischemia or arrhythmia  Impression and Plan:  Allen Tapia has been referred for pre-anesthesia review and clearance prior to him undergoing the planned anesthetic and procedural courses. Available labs, pertinent testing, and imaging results were personally reviewed by me in preparation for upcoming operative/procedural course. [facility] medical record has been updated following extensive record review and patient interview with PAT staff.   This patient has been appropriately cleared by cardiology with an overall ACCEPTABLE risk of significant perioperative cardiovascular complications. Based on clinical review performed today (06/07/22), barring any significant acute changes in the patient's overall condition, it is anticipated that he will be able to proceed with the planned surgical intervention. Any acute changes in clinical condition may necessitate his procedure being postponed and/or cancelled. Patient will meet with anesthesia team (MD and/or CRNA) on  the day of his procedure for preoperative evaluation/assessment. Questions regarding anesthetic course will be fielded at that time.   Pre-surgical instructions were reviewed with the patient during his PAT appointment, and questions were fielded to satisfaction by PAT clinical staff. He has been instructed on which medications that he will need to hold prior to surgery, as well as the ones that have been deemed safe/appropriate to take of the day of his procedure. As part of the general education provided by PAT, patient made aware both verbally and in writing, that he would need to abstain from the use of any illegal substances during his perioperative course.  He was advised that failure to follow the provided instructions could necessitate case cancellation or result serious perioperative complications up to and including death. Patient encouraged to contact PAT and/or his surgeon's office to discuss any questions or concerns that may arise prior to surgery; verbalized understanding.   Honor Loh, MSN, APRN, FNP-C, CEN [facility]  Peri-operative Services Nurse Practitioner Phone: [phone] Fax: [phone] 06/07/22 10:18 AM  NOTE: This note has been prepared using Dragon dictation software. Despite my best ability to proofread, there is always the potential that unintentional transcriptional errors may still occur from this process.

## 2022-06-06 NOTE — Patient Instructions (Addendum)
Your procedure is scheduled on:06-08-22 Friday Report to the Registration Desk on the 1st floor of the [facility].Then proceed to the 2nd floor Surgery Desk To find out your arrival time, please call [phone] between 1PM - 3PM on:06-07-22 Thursday If your arrival time is 6:00 am, do not arrive before that time as the [facility] entrance doors do not open until 6:00 am.  REMEMBER: Instructions that are not followed completely may result in serious medical risk, up to and including death; or upon the discretion of your surgeon and anesthesiologist your surgery may need to be rescheduled.  Do not eat food after midnight the night before surgery.  No gum chewing or hard candies.  You may however, drink CLEAR liquids up to 2 hours before you are scheduled to arrive for your surgery. Do not drink anything within 2 hours of your scheduled arrival time.  Clear liquids include: - water  - apple juice without pulp - gatorade (not RED colors) - black coffee or tea (Do NOT add milk or creamers to the coffee or tea) Do NOT drink anything that is not on this list.  In addition, your doctor has ordered for you to drink the provided:  Ensure Pre-Surgery Clear Carbohydrate Drink  Drinking this carbohydrate drink up to two hours before surgery helps to reduce insulin resistance and improve patient outcomes. Please complete drinking 2 hours before scheduled arrival time.  One week prior to surgery:Last dose will be on 06-07-22  Stop Anti-inflammatories (NSAIDS) such as Advil, Aleve, Ibuprofen, Motrin, Naproxen, Naprosyn and Aspirin based products such as Excedrin, Goody's Powder, BC Powder.You may however, continue to take Tylenol if needed for pain up until the day of surgery.You may continue your celecoxib (CELEBREX) up until the day prior to surgery  Stop ANY OVER THE COUNTER supplements/vitamins 7 days prior to surgery (Vitamin D, Multivitamin)  Last dose of 81 mg Aspirin was on 06-05-22  TAKE  ONLY THESE MEDICATIONS THE MORNING OF SURGERY WITH A SIP OF WATER: -baclofen (LIORESAL)  -buPROPion (WELLBUTRIN XL)  -mirabegron ER (MYRBETRIQ)  -pantoprazole (PROTONIX)   Use your budesonide-formoterol (SYMBICORT) and Albuterol Inhaler the day of surgery and bring your Albuterol Inhaler to the hospital  No Alcohol for 24 hours before or after surgery.  No Smoking including e-cigarettes for 24 hours before surgery.  No chewable tobacco products for at least 6 hours before surgery.  No nicotine patches on the day of surgery.  Do not use any "recreational" drugs for at least a week (preferably 2 weeks) before your surgery.  Please be advised that the combination of cocaine and anesthesia may have negative outcomes, up to and including death. If you test positive for cocaine, your surgery will be cancelled.  On the morning of surgery brush your teeth with toothpaste and water, you may rinse your mouth with mouthwash if you wish. Do not swallow any toothpaste or mouthwash.  Use CHG Soap as directed on instruction sheet.  Do not wear jewelry, make-up, hairpins, clips or nail polish.  Do not wear lotions, powders, or perfumes.   Do not shave body hair from the neck down 48 hours before surgery.  Contact lenses, hearing aids and dentures may not be worn into surgery.  Do not bring valuables to the hospital. [facility] is not responsible for any missing/lost belongings or valuables.   Notify your doctor if there is any change in your medical condition (cold, fever, infection).  Wear comfortable clothing (specific to your surgery type)  to the hospital.  After surgery, you can help prevent lung complications by doing breathing exercises.  Take deep breaths and cough every 1-2 hours. Your doctor may order a device called an Incentive Spirometer to help you take deep breaths. When coughing or sneezing, hold a pillow firmly against your incision with both hands. This is called "splinting."  Doing this helps protect your incision. It also decreases belly discomfort.  If you are being admitted to the hospital overnight, leave your suitcase in the car. After surgery it may be brought to your room.  In case of increased patient census, it may be necessary for you, the patient, to continue your postoperative care in the Same Day Surgery department.  If you are being discharged the day of surgery, you will not be allowed to drive home. You will need a responsible individual to drive you home and stay with you for 24 hours after surgery.   If you are taking public transportation, you will need to have a responsible individual with you.  Please call the [facility] Dept. at [phone] if you have any questions about these instructions.  Surgery Visitation Policy:  Patients undergoing a surgery or procedure may have two family members or support persons with them as long as the person is not COVID-19 positive or experiencing its symptoms.   Inpatient Visitation:    Visiting hours are 7 a.m. to 8 p.m. Up to four visitors are allowed at one time in a patient room. The visitors may rotate out with other people during the day. One designated support person (adult) may remain overnight.  Due to an increase in RSV and influenza rates and associated hospitalizations, children ages 33 and under will not be able to visit patients in [facility]. Masks continue to be strongly recommended.     Preparing for Surgery with CHLORHEXIDINE GLUCONATE (CHG) Soap  Chlorhexidine Gluconate (CHG) Soap  o An antiseptic cleaner that kills germs and bonds with the skin to continue killing germs even after washing  o Used for showering the night before surgery and morning of surgery  Before surgery, you can play an important role by reducing the number of germs on your skin.  CHG (Chlorhexidine gluconate) soap is an antiseptic cleanser which kills germs and bonds with the skin  to continue killing germs even after washing.  Please do not use if you have an allergy to CHG or antibacterial soaps. If your skin becomes reddened/irritated stop using the CHG.  1. Shower the NIGHT BEFORE SURGERY and the MORNING OF SURGERY with CHG soap.  2. If you choose to wash your hair, wash your hair first as usual with your normal shampoo.  3. After shampooing, rinse your hair and body thoroughly to remove the shampoo.  4. Use CHG as you would any other liquid soap. You can apply CHG directly to the skin and wash gently with a scrungie or a clean washcloth.  5. Apply the CHG soap to your body only from the neck down. Do not use on open wounds or open sores. Avoid contact with your eyes, ears, mouth, and genitals (private parts). Wash face and genitals (private parts) with your normal soap.  6. Wash thoroughly, paying special attention to the area where your surgery will be performed.  7. Thoroughly rinse your body with warm water.  8. Do not shower/wash with your normal soap after using and rinsing off the CHG soap.  9. Pat yourself dry with a clean towel.  10. Wear clean pajamas to bed the night before surgery.  12. Place clean sheets on your bed the night of your first shower and do not sleep with pets.  13. Shower again with the CHG soap on the day of surgery prior to arriving at the hospital.  14. Do not apply any deodorants/lotions/powders.  15. Please wear clean clothes to the hospital.  How to Use an Incentive Spirometer An incentive spirometer is a tool that measures how well you are filling your lungs with each breath. Learning to take long, deep breaths using this tool can help you keep your lungs clear and active. This may help to reverse or lessen your chance of developing breathing (pulmonary) problems, especially infection. You may be asked to use a spirometer: After a surgery. If you have a lung problem or a history of smoking. After a long period of time  when you have been unable to move or be active. If the spirometer includes an indicator to show the highest number that you have reached, your health care provider or respiratory therapist will help you set a goal. Keep a log of your progress as told by your health care provider. What are the risks? Breathing too quickly may cause dizziness or cause you to pass out. Take your time so you do not get dizzy or light-headed. If you are in pain, you may need to take pain medicine before doing incentive spirometry. It is harder to take a deep breath if you are having pain. How to use your incentive spirometer  Sit up on the edge of your bed or on a chair. Hold the incentive spirometer so that it is in an upright position. Before you use the spirometer, breathe out normally. Place the mouthpiece in your mouth. Make sure your lips are closed tightly around it. Breathe in slowly and as deeply as you can through your mouth, causing the piston or the ball to rise toward the top of the chamber. Hold your breath for 3-5 seconds, or for as long as possible. If the spirometer includes a coach indicator, use this to guide you in breathing. Slow down your breathing if the indicator goes above the marked areas. Remove the mouthpiece from your mouth and breathe out normally. The piston or ball will return to the bottom of the chamber. Rest for a few seconds, then repeat the steps 10 or more times. Take your time and take a few normal breaths between deep breaths so that you do not get dizzy or light-headed. Do this every 1-2 hours when you are awake. If the spirometer includes a goal marker to show the highest number you have reached (best effort), use this as a goal to work toward during each repetition. After each set of 10 deep breaths, cough a few times. This will help to make sure that your lungs are clear. If you have an incision on your chest or abdomen from surgery, place a pillow or a rolled-up towel  firmly against the incision when you cough. This can help to reduce pain while taking deep breaths and coughing. General tips When you are able to get out of bed: Walk around often. Continue to take deep breaths and cough in order to clear your lungs. Keep using the incentive spirometer until your health care provider says it is okay to stop using it. If you have been in the hospital, you may be told to keep using the spirometer at home. Contact a health care provider  if: You are having difficulty using the spirometer. You have trouble using the spirometer as often as instructed. Your pain medicine is not giving enough relief for you to use the spirometer as told. You have a fever. Get help right away if: You develop shortness of breath. You develop a cough with bloody mucus from the lungs. You have fluid or blood coming from an incision site after you cough. Summary An incentive spirometer is a tool that can help you learn to take long, deep breaths to keep your lungs clear and active. You may be asked to use a spirometer after a surgery, if you have a lung problem or a history of smoking, or if you have been inactive for a long period of time. Use your incentive spirometer as instructed every 1-2 hours while you are awake. If you have an incision on your chest or abdomen, place a pillow or a rolled-up towel firmly against your incision when you cough. This will help to reduce pain. Get help right away if you have shortness of breath, you cough up bloody mucus, or blood comes from your incision when you cough. This information is not intended to replace advice given to you by your health care provider. Make sure you discuss any questions you have with your health care provider. Document Revised: 06/01/2019 Document Reviewed: 06/01/2019 Elsevier Patient Education  [place].

## 2022-06-07 ENCOUNTER — Ambulatory Visit: Payer: Medicare Other | Attending: Internal Medicine | Admitting: Nurse Practitioner

## 2022-06-07 DIAGNOSIS — Z01810 Encounter for preprocedural cardiovascular examination: Secondary | ICD-10-CM

## 2022-06-07 MED ORDER — CEFAZOLIN SODIUM-DEXTROSE 2-4 GM/100ML-% IV SOLN
2.0000 g | INTRAVENOUS | Status: AC
  Administered 2022-06-08: 2 g via INTRAVENOUS

## 2022-06-07 MED ORDER — CELECOXIB 200 MG PO CAPS: 400.0000 mg | ORAL_CAPSULE | Freq: Once | ORAL | Status: AC

## 2022-06-07 MED ORDER — DEXAMETHASONE SODIUM PHOSPHATE 10 MG/ML IJ SOLN: 8.0000 mg | Freq: Once | INTRAMUSCULAR | Status: AC

## 2022-06-07 MED ORDER — GABAPENTIN 300 MG PO CAPS: 300.0000 mg | ORAL_CAPSULE | Freq: Once | ORAL | Status: AC

## 2022-06-07 MED ORDER — LACTATED RINGERS IV SOLN: INTRAVENOUS | Status: DC

## 2022-06-07 MED ORDER — CHLORHEXIDINE GLUCONATE 0.12 % MT SOLN: 15.0000 mL | Freq: Once | OROMUCOSAL | Status: AC

## 2022-06-07 MED ORDER — CHLORHEXIDINE GLUCONATE 4 % EX LIQD: 60.0000 mL | Freq: Once | CUTANEOUS | Status: DC

## 2022-06-07 MED ORDER — TRANEXAMIC ACID-NACL 1000-0.7 MG/100ML-% IV SOLN: 1000.0000 mg | INTRAVENOUS | Status: DC

## 2022-06-07 MED ORDER — ORAL CARE MOUTH RINSE: 15.0000 mL | Freq: Once | OROMUCOSAL | Status: AC

## 2022-06-07 NOTE — Progress Notes (Signed)
Virtual Visit via Telephone Note   Because of [name]'s co-morbid illnesses, he is at least at moderate risk for complications without adequate follow up.  This format is felt to be most appropriate for this patient at this time.  The patient did not have access to video technology/had technical difficulties with video requiring transitioning to audio format only (telephone).  All issues noted in this document were discussed and addressed.  No physical exam could be performed with this format.  Please refer to the patient's chart for his consent to telehealth for [facility].  Evaluation Performed:  Preoperative cardiovascular risk assessment _____________   Date:  06/07/2022   Patient ID:  Allen Tapia, DOB 05-Mar-1956, MRN [id number] Patient Location:  Home Provider location:   Office  Primary Care Provider:  Derinda Late, MD Primary Cardiologist:  Buford Dresser, MD  Chief Complaint / Patient Profile   67 y.o. y/o male with a h/o CAD, hyperlipidemia, MS, and squamous cell skin cancer who is pending computer assisted to total knee plasty on 06/08/2022 with Dr. Skip Estimable and presents today for telephonic preoperative cardiovascular risk assessment.  History of Present Illness    Allen Tapia is a 68 y.o. male who presents via audio/video conferencing for a telehealth visit today.  Pt was last seen in cardiology clinic on 02/21/2022 by Dr. Harrell Gave. At that time Allen Tapia was doing well.  The patient is now pending procedure as outlined above. Since his last visit, he has done well from a cardiac standpoint.   He denies chest pain, palpitations, dyspnea, pnd, orthopnea, n, v, dizziness, syncope, edema, weight gain, or early satiety. All other systems reviewed and are otherwise negative except as noted above.   Past Medical History    Past Medical History:  Diagnosis Date   Anginal pain ([place])    atypical chest pain cardiac cath  performed at that time   Arthritis    Asthma    Atypical chest pain    Barrett's esophagus    Bilateral cataracts    BPH (benign prostatic hyperplasia)    Chicken pox    Chronic constipation    Coronary artery disease 07/14/2007   a.) LHC 07/14/2007: 40% pRCA-1, 40% pRCA-2, 40% dRCA, 20% RPL1, 30% LM, 20% mLCx-1, 40% mLCx-2, 20% OM3-1, 20% OM3-2, 20% pLAD, 40% mLAD-1, 70% mLAD-2, 40% mLAD-3, 30% dLAD, 40% D1, 40% D2 --> med mgmt.   Depression    Foreign body (FB) in soft tissue    Former tobacco use    GERD (gastroesophageal reflux disease)    History of 2019 novel coronavirus disease (COVID-19) 04/07/2019   a.) tested (+) at [facility] 04/16/2019, 05/30/2021; b.) 2 other (+) tests at home (dates unknown)   History of kidney stones    Hyperlipidemia    Hyperplastic colon polyp    Infrarenal abdominal aortic aneurysm (AAA) without rupture ([place])    a.) MR lumbar spine 12/30/2020: infrarenal saccular AAA measuring 2.2 cm; b.) CTA A/P 01/05/2021: measured 2.6 cm; c.) s/p EVAR 03/03/2021   Ischemic heart disease 12/20/2010   Long term current use of immunosuppressive drug    a.) on ofatumumab for MS   MS (multiple sclerosis) ([place])    a.) on ofatumumab   Shingles    Tubular adenoma of colon 08/23/2016   Past Surgical History:  Procedure Laterality Date   ABDOMINAL AORTIC ENDOVASCULAR STENT GRAFT N/A 03/03/2021   Procedure: ABDOMINAL AORTIC ENDOVASCULAR STENT GRAFT;  Surgeon: Donzetta Matters,  Georgia Dom, MD;  Location: [facility];  Service: Vascular;  Laterality: N/A;   [place]   CATARACT EXTRACTION, BILATERAL  03/12/2019   COLONOSCOPY  02/05/2008   COLONOSCOPY WITH PROPOFOL N/A 08/23/2016   Procedure: COLONOSCOPY WITH PROPOFOL;  Surgeon: Lollie Sails, MD;  Location[facility]COPY;  Service: Endoscopy;  Laterality: N/A;   COLONOSCOPY WITH PROPOFOL N/A 04/29/2020   Procedure: COLONOSCOPY WITH PROPOFOL;  Surgeon: Lesly Rubenstein, MD;  Location:  [facility] ENDOSCOPY;  Service: Endoscopy;  Laterality: N/A;   CYSTOSCOPY WITH INSERTION OF UROLIFT N/A 05/20/2020   Procedure: CYSTOSCOPY WITH INSERTION OF UROLIFT;  Surgeon: Billey Co, MD;  Location: [facility] ORS;  Service: Urology;  Laterality: N/A;   ESOPHAGOGASTRODUODENOSCOPY (EGD) WITH PROPOFOL N/A 08/23/2016   Procedure: ESOPHAGOGASTRODUODENOSCOPY (EGD) WITH PROPOFOL;  Surgeon: Lollie Sails, MD;  Location: [facility] ENDOSCOPY;  Service: Endoscopy;  Laterality: N/A;   ESOPHAGOGASTRODUODENOSCOPY (EGD) WITH PROPOFOL N/A 04/29/2020   Procedure: ESOPHAGOGASTRODUODENOSCOPY (EGD) WITH PROPOFOL;  Surgeon: Lesly Rubenstein, MD;  Location: [facility] ENDOSCOPY;  Service: Endoscopy;  Laterality: N/A;   ESOPHAGOGASTRODUODENOSCOPY ENDOSCOPY  02/05/2008   EYE SURGERY     eye trauma  1970   piece of metal to eye removed in clinic   JOINT REPLACEMENT     KNEE ARTHROPLASTY Right 10/05/2020   Procedure: COMPUTER ASSISTED TOTAL KNEE ARTHROPLASTY;  Surgeon: Dereck Leep, MD;  Location: [facility] ORS;  Service: Orthopedics;  Laterality: Right;   KNEE ARTHROSCOPY Left 12/11/2021   Procedure: ARTHROSCOPY KNEE;  Surgeon: Dereck Leep, MD;  Location: [facility] ORS;  Service: Orthopedics;  Laterality: Left;   KNEE ARTHROSCOPY W/ PARTIAL MEDIAL MENISCECTOMY Right 01/02/2011   LEFT HEART CATH AND CORONARY ANGIOGRAPHY Left 07/14/2007   Procedure: LEFT HEART CATH AND CORONARY ANGIOGRAPHY; Location: [facility]; Surgeon: Doristine Bosworth, MD   SEPTOPLASTY     TONSILLECTOMY  02/05/2008   ULTRASOUND GUIDANCE FOR VASCULAR ACCESS Left 03/03/2021   Procedure: ULTRASOUND GUIDANCE FOR VASCULAR ACCESS, LEFT FEMORAL ARTERY;  Surgeon: Waynetta Sandy, MD;  Location: [facility];  Service: Vascular;  Laterality: Left;   WRIST FRACTURE SURGERY Right     Allergies  No Known Allergies  Home Medications    Prior to Admission medications   Medication Sig Start Date End Date Taking? Authorizing Provider  acetaminophen (TYLENOL) 500 MG  tablet Take 500-1,000 mg by mouth 2 (two) times daily.    [provider]  albuterol (VENTOLIN HFA) 108 (90 Base) MCG/ACT inhaler Inhale 2 puffs into the lungs every 6 (six) hours as needed for wheezing or shortness of breath. 09/12/21   Martyn Ehrich, NP  amphetamine-dextroamphetamine (ADDERALL XR) 25 MG 24 hr capsule Take 25 mg by mouth daily as needed (focus).    [provider]  aspirin EC 81 MG tablet Take 81 mg by mouth daily. Swallow whole.    [provider]  baclofen (LIORESAL) 20 MG tablet Take 20 mg by mouth 3 (three) times daily.    [provider]  budesonide-formoterol (SYMBICORT) 160-4.5 MCG/ACT inhaler Inhale 1-2 puffs into the lungs in the morning and at bedtime. 02/19/22   Martyn Ehrich, NP  buPROPion (WELLBUTRIN XL) 150 MG 24 hr tablet Take 150 mg by mouth every morning. 12/16/18   [provider]  celecoxib (CELEBREX) 200 MG capsule Take 200 mg by mouth 2 (two) times daily. 02/26/21   [provider]  gabapentin (NEURONTIN) 300 MG capsule Take 300-600 capsules by mouth 2 (two) times  daily. 600 mg in am and 300 mg in afternoon 05/15/21   [provider]  mirabegron ER (MYRBETRIQ) 50 MG TB24 tablet Take 50 mg by mouth every morning.    [provider]  montelukast (SINGULAIR) 10 MG tablet Take 1 tablet (10 mg total) by mouth at bedtime. 02/07/22   Martyn Ehrich, NP  Multiple Vitamins-Minerals (MULTIVITAMIN WITH MINERALS) tablet Take 1 tablet by mouth daily.    [provider]  Ofatumumab (KESIMPTA) 20 MG/0.4ML SOAJ Inject 1 Dose into the skin every 30 (thirty) days. 1st day of every month    [provider]  pantoprazole (PROTONIX) 20 MG tablet Take 1 tablet by mouth at bedtime. 09/11/21   [provider]  Polyethyl Glycol-Propyl Glycol (SYSTANE) 0.4-0.3 % SOLN Apply 1 drop to eye 2 (two) times daily.    [provider]  rosuvastatin (CRESTOR) 20 MG tablet TAKE 1  TABLET AT BEDTIME 01/29/22   Buford Dresser, MD  Spacer/Aero-Holding Chambers (AEROCHAMBER MV) inhaler Use as instructed 06/11/18   Icard, Octavio Graves, DO  tamsulosin (FLOMAX) 0.4 MG CAPS capsule Take 1 capsule (0.4 mg total) by mouth daily. Patient taking differently: Take 0.4 mg by mouth daily after supper. 04/04/22   Billey Co, MD  traZODone (DESYREL) 100 MG tablet Take 100 mg by mouth at bedtime.    [provider]  Vibegron (GEMTESA) 75 MG TABS Take 75 mg by mouth daily. Patient not taking: Reported on 06/04/2022 04/04/22   Billey Co, MD  VITAMIN D PO Take 1 capsule by mouth daily.    [provider]    Physical Exam    Vital Signs:  Allen Tapia does not have vital signs available for review today.  Given telephonic nature of communication, physical exam is limited. AAOx3. NAD. Normal affect.  Speech and respirations are unlabored.  Accessory Clinical Findings    None  Assessment & Plan    1.  Preoperative Cardiovascular Risk Assessment:  According to the Revised Cardiac Risk Index (RCRI), his Perioperative Risk of Major Cardiac Event is (%): 0.9. His Functional Capacity in METs is: 7.01 according to the [facility] Activity Status Index (DASI).Therefore, based on ACC/AHA guidelines, patient would be at acceptable risk for the planned procedure without further cardiovascular testing.   The patient was advised that if he develops new symptoms prior to surgery to contact our office to arrange for a follow-up visit, and he verbalized understanding.  Per office protocol, he may hold Aspirin for 5-7 days prior to procedure. Please resume Aspirin as soon as possible postprocedure, at the discretion of the surgeon.    A copy of this note will be routed to requesting surgeon.  Time:   Today, I have spent 5 minutes with the patient with telehealth technology discussing medical history, symptoms, and management plan.     Lenna Sciara, NP  06/07/2022,  9:57 AM

## 2022-06-08 ENCOUNTER — Ambulatory Visit: Payer: Medicare Other | Admitting: Urgent Care

## 2022-06-08 ENCOUNTER — Observation Stay: Payer: Medicare Other

## 2022-06-08 ENCOUNTER — Encounter
Admission: RE | Disposition: A | Discharge status: Home Health | Payer: Self-pay | Source: Ambulatory Visit | Attending: Orthopedic Surgery

## 2022-06-08 ENCOUNTER — Other Ambulatory Visit: Payer: Self-pay

## 2022-06-08 ENCOUNTER — Encounter: Payer: Self-pay | Admitting: Orthopedic Surgery

## 2022-06-08 ENCOUNTER — Observation Stay
Admission: RE | Admit: 2022-06-08 | Discharge: 2022-06-09 | Disposition: A | Discharge status: Home Health | Payer: Medicare Other | Source: Ambulatory Visit | Attending: Orthopedic Surgery | Admitting: Orthopedic Surgery

## 2022-06-08 DIAGNOSIS — G35 Multiple sclerosis: Secondary | ICD-10-CM

## 2022-06-08 DIAGNOSIS — Z7982 Long term (current) use of aspirin: Secondary | ICD-10-CM | POA: Diagnosis not present

## 2022-06-08 DIAGNOSIS — J45909 Unspecified asthma, uncomplicated: Secondary | ICD-10-CM | POA: Insufficient documentation

## 2022-06-08 DIAGNOSIS — Z96651 Presence of right artificial knee joint: Secondary | ICD-10-CM | POA: Insufficient documentation

## 2022-06-08 DIAGNOSIS — R0600 Dyspnea, unspecified: Secondary | ICD-10-CM

## 2022-06-08 DIAGNOSIS — M1712 Unilateral primary osteoarthritis, left knee: Principal | ICD-10-CM | POA: Insufficient documentation

## 2022-06-08 DIAGNOSIS — Z96659 Presence of unspecified artificial knee joint: Secondary | ICD-10-CM

## 2022-06-08 DIAGNOSIS — Z79899 Other long term (current) drug therapy: Secondary | ICD-10-CM | POA: Insufficient documentation

## 2022-06-08 DIAGNOSIS — I2510 Atherosclerotic heart disease of native coronary artery without angina pectoris: Secondary | ICD-10-CM

## 2022-06-08 DIAGNOSIS — R7303 Prediabetes: Secondary | ICD-10-CM

## 2022-06-08 HISTORY — PX: KNEE ARTHROPLASTY: SHX992

## 2022-06-08 SURGERY — ARTHROPLASTY, KNEE, TOTAL, USING IMAGELESS COMPUTER-ASSISTED NAVIGATION
Anesthesia: General | Site: Knee | Laterality: Left

## 2022-06-08 MED ORDER — AMPHETAMINE-DEXTROAMPHET ER 5 MG PO CP24: 25.0000 mg | ORAL_CAPSULE | Freq: Every day | ORAL | Status: DC | PRN

## 2022-06-08 MED ORDER — ONDANSETRON HCL 4 MG/2ML IJ SOLN
INTRAMUSCULAR | Status: AC
  Filled 2022-06-08: qty 2

## 2022-06-08 MED ORDER — ALBUTEROL SULFATE (2.5 MG/3ML) 0.083% IN NEBU: 2.5000 mg | INHALATION_SOLUTION | Freq: Four times a day (QID) | RESPIRATORY_TRACT | Status: DC | PRN

## 2022-06-08 MED ORDER — FENTANYL CITRATE (PF) 100 MCG/2ML IJ SOLN
25.0000 ug | INTRAMUSCULAR | Status: DC | PRN
  Administered 2022-06-08: 25 ug via INTRAVENOUS

## 2022-06-08 MED ORDER — OXYCODONE HCL 5 MG PO TABS: 5.0000 mg | ORAL_TABLET | Freq: Once | ORAL | Status: DC | PRN

## 2022-06-08 MED ORDER — ENOXAPARIN SODIUM 30 MG/0.3ML IJ SOSY
30.0000 mg | PREFILLED_SYRINGE | Freq: Two times a day (BID) | INTRAMUSCULAR | Status: DC
  Administered 2022-06-09: 30 mg via SUBCUTANEOUS
  Filled 2022-06-08: qty 0.3

## 2022-06-08 MED ORDER — ACETAMINOPHEN 10 MG/ML IV SOLN
1000.0000 mg | Freq: Four times a day (QID) | INTRAVENOUS | Status: DC
  Administered 2022-06-08 – 2022-06-09 (×3): 1000 mg via INTRAVENOUS
  Filled 2022-06-08 (×4): qty 100

## 2022-06-08 MED ORDER — CELECOXIB 200 MG PO CAPS
ORAL_CAPSULE | ORAL | Status: AC
  Administered 2022-06-08: 400 mg via ORAL
  Filled 2022-06-08: qty 2

## 2022-06-08 MED ORDER — SODIUM CHLORIDE 0.9 % IV SOLN: INTRAVENOUS | Status: DC

## 2022-06-08 MED ORDER — DEXAMETHASONE SODIUM PHOSPHATE 10 MG/ML IJ SOLN
INTRAMUSCULAR | Status: AC
  Filled 2022-06-08: qty 1

## 2022-06-08 MED ORDER — ROCURONIUM BROMIDE 100 MG/10ML IV SOLN
INTRAVENOUS | Status: DC | PRN
  Administered 2022-06-08: 20 mg via INTRAVENOUS
  Administered 2022-06-08: 70 mg via INTRAVENOUS
  Administered 2022-06-08: 20 mg via INTRAVENOUS

## 2022-06-08 MED ORDER — GABAPENTIN 300 MG PO CAPS
300.0000 mg | ORAL_CAPSULE | Freq: Every day | ORAL | Status: DC
  Administered 2022-06-09: 300 mg via ORAL
  Filled 2022-06-08: qty 1

## 2022-06-08 MED ORDER — OXYCODONE HCL 5 MG/5ML PO SOLN: 5.0000 mg | Freq: Once | ORAL | Status: DC | PRN

## 2022-06-08 MED ORDER — SODIUM CHLORIDE 0.9 % IR SOLN
Status: DC | PRN
  Administered 2022-06-08: 3000 mL

## 2022-06-08 MED ORDER — BACLOFEN 10 MG PO TABS
20.0000 mg | ORAL_TABLET | Freq: Three times a day (TID) | ORAL | Status: DC
  Administered 2022-06-08 – 2022-06-09 (×3): 20 mg via ORAL
  Filled 2022-06-08 (×3): qty 2

## 2022-06-08 MED ORDER — ROCURONIUM BROMIDE 10 MG/ML (PF) SYRINGE
PREFILLED_SYRINGE | INTRAVENOUS | Status: AC
  Filled 2022-06-08: qty 10

## 2022-06-08 MED ORDER — MAGNESIUM HYDROXIDE 400 MG/5ML PO SUSP
30.0000 mL | Freq: Every day | ORAL | Status: DC
  Administered 2022-06-09: 30 mL via ORAL
  Filled 2022-06-08 (×3): qty 30

## 2022-06-08 MED ORDER — ENSURE PRE-SURGERY PO LIQD: 296.0000 mL | Freq: Once | ORAL | Status: DC

## 2022-06-08 MED ORDER — PHENYLEPHRINE HCL (PRESSORS) 10 MG/ML IV SOLN
INTRAVENOUS | Status: DC | PRN
  Administered 2022-06-08: 80 ug via INTRAVENOUS
  Administered 2022-06-08: 160 ug via INTRAVENOUS
  Administered 2022-06-08: 80 ug via INTRAVENOUS

## 2022-06-08 MED ORDER — ACETAMINOPHEN 325 MG PO TABS: 325.0000 mg | ORAL_TABLET | Freq: Four times a day (QID) | ORAL | Status: DC | PRN

## 2022-06-08 MED ORDER — TRAZODONE HCL 100 MG PO TABS
100.0000 mg | ORAL_TABLET | Freq: Every day | ORAL | Status: DC
  Administered 2022-06-08: 100 mg via ORAL
  Filled 2022-06-08: qty 1

## 2022-06-08 MED ORDER — MIDAZOLAM HCL 5 MG/5ML IJ SOLN
INTRAMUSCULAR | Status: DC | PRN
  Administered 2022-06-08: 2 mg via INTRAVENOUS

## 2022-06-08 MED ORDER — TRANEXAMIC ACID-NACL 1000-0.7 MG/100ML-% IV SOLN
INTRAVENOUS | Status: DC | PRN
  Administered 2022-06-08: 1000 mg via INTRAVENOUS

## 2022-06-08 MED ORDER — MENTHOL 3 MG MT LOZG: 1.0000 | LOZENGE | OROMUCOSAL | Status: DC | PRN

## 2022-06-08 MED ORDER — METOCLOPRAMIDE HCL 5 MG PO TABS
10.0000 mg | ORAL_TABLET | Freq: Three times a day (TID) | ORAL | Status: DC
  Administered 2022-06-08: 10 mg via ORAL
  Filled 2022-06-08 (×2): qty 2

## 2022-06-08 MED ORDER — OXYCODONE HCL 5 MG PO TABS
ORAL_TABLET | ORAL | Status: AC
  Administered 2022-06-08: 10 mg via ORAL
  Filled 2022-06-08: qty 2

## 2022-06-08 MED ORDER — OXYCODONE HCL 5 MG PO TABS: 10.0000 mg | ORAL_TABLET | ORAL | Status: DC | PRN

## 2022-06-08 MED ORDER — HYDROMORPHONE HCL 1 MG/ML IJ SOLN: 0.5000 mg | INTRAMUSCULAR | Status: DC | PRN

## 2022-06-08 MED ORDER — FENTANYL CITRATE (PF) 100 MCG/2ML IJ SOLN
INTRAMUSCULAR | Status: AC
  Filled 2022-06-08: qty 2

## 2022-06-08 MED ORDER — MIRABEGRON ER 50 MG PO TB24
50.0000 mg | ORAL_TABLET | Freq: Every day | ORAL | Status: DC
  Administered 2022-06-09: 50 mg via ORAL
  Filled 2022-06-08: qty 1

## 2022-06-08 MED ORDER — FLEET ENEMA 7-19 GM/118ML RE ENEM: 1.0000 | ENEMA | Freq: Once | RECTAL | Status: DC | PRN

## 2022-06-08 MED ORDER — OXYCODONE HCL 5 MG PO TABS
5.0000 mg | ORAL_TABLET | ORAL | Status: DC | PRN
  Administered 2022-06-08 – 2022-06-09 (×2): 5 mg via ORAL
  Filled 2022-06-08 (×2): qty 1

## 2022-06-08 MED ORDER — ACETAMINOPHEN 10 MG/ML IV SOLN
INTRAVENOUS | Status: AC
  Filled 2022-06-08: qty 100

## 2022-06-08 MED ORDER — HYDROMORPHONE HCL 1 MG/ML IJ SOLN
INTRAMUSCULAR | Status: AC
  Filled 2022-06-08: qty 1

## 2022-06-08 MED ORDER — ONDANSETRON HCL 4 MG/2ML IJ SOLN: 4.0000 mg | Freq: Four times a day (QID) | INTRAMUSCULAR | Status: DC | PRN

## 2022-06-08 MED ORDER — CHLORHEXIDINE GLUCONATE 0.12 % MT SOLN
OROMUCOSAL | Status: AC
  Administered 2022-06-08: 15 mL via OROMUCOSAL
  Filled 2022-06-08: qty 15

## 2022-06-08 MED ORDER — POLYVINYL ALCOHOL 1.4 % OP SOLN
1.0000 [drp] | Freq: Two times a day (BID) | OPHTHALMIC | Status: DC
  Administered 2022-06-08 – 2022-06-09 (×2): 1 [drp] via OPHTHALMIC
  Filled 2022-06-08: qty 15

## 2022-06-08 MED ORDER — ALUM & MAG HYDROXIDE-SIMETH 200-200-20 MG/5ML PO SUSP: 30.0000 mL | ORAL | Status: DC | PRN

## 2022-06-08 MED ORDER — PANTOPRAZOLE SODIUM 40 MG PO TBEC
40.0000 mg | DELAYED_RELEASE_TABLET | Freq: Two times a day (BID) | ORAL | Status: DC
  Administered 2022-06-08 – 2022-06-09 (×2): 40 mg via ORAL
  Filled 2022-06-08 (×2): qty 1

## 2022-06-08 MED ORDER — TRANEXAMIC ACID-NACL 1000-0.7 MG/100ML-% IV SOLN: 1000.0000 mg | Freq: Once | INTRAVENOUS | Status: AC

## 2022-06-08 MED ORDER — GABAPENTIN 300 MG PO CAPS
ORAL_CAPSULE | ORAL | Status: AC
  Administered 2022-06-08: 300 mg via ORAL
  Filled 2022-06-08: qty 1

## 2022-06-08 MED ORDER — FENTANYL CITRATE (PF) 100 MCG/2ML IJ SOLN
INTRAMUSCULAR | Status: DC | PRN
  Administered 2022-06-08 (×2): 50 ug via INTRAVENOUS

## 2022-06-08 MED ORDER — GABAPENTIN 300 MG PO CAPS
600.0000 mg | ORAL_CAPSULE | Freq: Two times a day (BID) | ORAL | Status: DC
  Filled 2022-06-08: qty 2

## 2022-06-08 MED ORDER — SODIUM CHLORIDE (PF) 0.9 % IJ SOLN
INTRAMUSCULAR | Status: DC | PRN
  Administered 2022-06-08: 120 mL via INTRAMUSCULAR

## 2022-06-08 MED ORDER — CEFAZOLIN SODIUM-DEXTROSE 2-4 GM/100ML-% IV SOLN
INTRAVENOUS | Status: AC
  Filled 2022-06-08: qty 100

## 2022-06-08 MED ORDER — ONDANSETRON HCL 4 MG/2ML IJ SOLN
INTRAMUSCULAR | Status: DC | PRN
  Administered 2022-06-08: 4 mg via INTRAVENOUS

## 2022-06-08 MED ORDER — ONDANSETRON HCL 4 MG PO TABS: 4.0000 mg | ORAL_TABLET | Freq: Four times a day (QID) | ORAL | Status: DC | PRN

## 2022-06-08 MED ORDER — PHENYLEPHRINE HCL-NACL 20-0.9 MG/250ML-% IV SOLN
INTRAVENOUS | Status: DC | PRN
  Administered 2022-06-08: 40 ug/min via INTRAVENOUS

## 2022-06-08 MED ORDER — MONTELUKAST SODIUM 10 MG PO TABS
10.0000 mg | ORAL_TABLET | Freq: Every day | ORAL | Status: DC
  Administered 2022-06-08: 10 mg via ORAL
  Filled 2022-06-08: qty 1

## 2022-06-08 MED ORDER — BUPROPION HCL ER (XL) 150 MG PO TB24
150.0000 mg | ORAL_TABLET | Freq: Every day | ORAL | Status: DC
  Administered 2022-06-09: 150 mg via ORAL
  Filled 2022-06-08: qty 1

## 2022-06-08 MED ORDER — DEXAMETHASONE SODIUM PHOSPHATE 10 MG/ML IJ SOLN
INTRAMUSCULAR | Status: AC
  Administered 2022-06-08: 8 mg via INTRAVENOUS
  Filled 2022-06-08: qty 1

## 2022-06-08 MED ORDER — FENTANYL CITRATE (PF) 100 MCG/2ML IJ SOLN
INTRAMUSCULAR | Status: AC
  Administered 2022-06-08: 25 ug via INTRAVENOUS
  Filled 2022-06-08: qty 2

## 2022-06-08 MED ORDER — TRANEXAMIC ACID-NACL 1000-0.7 MG/100ML-% IV SOLN
INTRAVENOUS | Status: AC
  Administered 2022-06-08: 1000 mg via INTRAVENOUS
  Filled 2022-06-08: qty 100

## 2022-06-08 MED ORDER — HYDROMORPHONE HCL 1 MG/ML IJ SOLN
INTRAMUSCULAR | Status: DC | PRN
  Administered 2022-06-08 (×2): .5 mg via INTRAVENOUS

## 2022-06-08 MED ORDER — LIDOCAINE HCL (PF) 2 % IJ SOLN
INTRAMUSCULAR | Status: AC
  Filled 2022-06-08: qty 5

## 2022-06-08 MED ORDER — SUGAMMADEX SODIUM 200 MG/2ML IV SOLN
INTRAVENOUS | Status: DC | PRN
  Administered 2022-06-08: 181.4 mg via INTRAVENOUS

## 2022-06-08 MED ORDER — PHENOL 1.4 % MT LIQD: 1.0000 | OROMUCOSAL | Status: DC | PRN

## 2022-06-08 MED ORDER — ACETAMINOPHEN 10 MG/ML IV SOLN
INTRAVENOUS | Status: DC | PRN
  Administered 2022-06-08: 1000 mg via INTRAVENOUS

## 2022-06-08 MED ORDER — MIDAZOLAM HCL 2 MG/2ML IJ SOLN
INTRAMUSCULAR | Status: AC
  Filled 2022-06-08: qty 2

## 2022-06-08 MED ORDER — EPHEDRINE SULFATE (PRESSORS) 50 MG/ML IJ SOLN
INTRAMUSCULAR | Status: DC | PRN
  Administered 2022-06-08 (×2): 5 mg via INTRAVENOUS
  Administered 2022-06-08 (×4): 10 mg via INTRAVENOUS

## 2022-06-08 MED ORDER — TRAMADOL HCL 50 MG PO TABS: 50.0000 mg | ORAL_TABLET | ORAL | Status: DC | PRN

## 2022-06-08 MED ORDER — SURGIPHOR WOUND IRRIGATION SYSTEM - OPTIME: TOPICAL | Status: DC | PRN

## 2022-06-08 MED ORDER — PROPOFOL 10 MG/ML IV BOLUS
INTRAVENOUS | Status: DC | PRN
  Administered 2022-06-08: 40 mg via INTRAVENOUS
  Administered 2022-06-08: 160 mg via INTRAVENOUS

## 2022-06-08 MED ORDER — SENNOSIDES-DOCUSATE SODIUM 8.6-50 MG PO TABS
1.0000 | ORAL_TABLET | Freq: Two times a day (BID) | ORAL | Status: DC
  Administered 2022-06-08 – 2022-06-09 (×2): 1 via ORAL
  Filled 2022-06-08 (×3): qty 1

## 2022-06-08 MED ORDER — LIDOCAINE HCL (CARDIAC) PF 100 MG/5ML IV SOSY
PREFILLED_SYRINGE | INTRAVENOUS | Status: DC | PRN
  Administered 2022-06-08: 80 mg via INTRAVENOUS

## 2022-06-08 MED ORDER — GLYCOPYRROLATE 0.2 MG/ML IJ SOLN
INTRAMUSCULAR | Status: DC | PRN
  Administered 2022-06-08 (×2): .1 mg via INTRAVENOUS

## 2022-06-08 MED ORDER — DIPHENHYDRAMINE HCL 12.5 MG/5ML PO ELIX: 12.5000 mg | ORAL_SOLUTION | ORAL | Status: DC | PRN

## 2022-06-08 MED ORDER — CEFAZOLIN SODIUM-DEXTROSE 2-4 GM/100ML-% IV SOLN
2.0000 g | Freq: Four times a day (QID) | INTRAVENOUS | Status: AC
  Administered 2022-06-08 – 2022-06-09 (×2): 2 g via INTRAVENOUS
  Filled 2022-06-08 (×4): qty 100

## 2022-06-08 MED ORDER — MOMETASONE FURO-FORMOTEROL FUM 200-5 MCG/ACT IN AERO
2.0000 | INHALATION_SPRAY | Freq: Two times a day (BID) | RESPIRATORY_TRACT | Status: DC
  Administered 2022-06-08 – 2022-06-09 (×2): 2 via RESPIRATORY_TRACT
  Filled 2022-06-08: qty 8.8

## 2022-06-08 MED ORDER — ROSUVASTATIN CALCIUM 10 MG PO TABS
20.0000 mg | ORAL_TABLET | Freq: Every day | ORAL | Status: DC
  Administered 2022-06-08: 20 mg via ORAL
  Filled 2022-06-08: qty 2

## 2022-06-08 MED ORDER — TAMSULOSIN HCL 0.4 MG PO CAPS
0.4000 mg | ORAL_CAPSULE | Freq: Every day | ORAL | Status: DC
  Administered 2022-06-08: 0.4 mg via ORAL
  Filled 2022-06-08: qty 1

## 2022-06-08 MED ORDER — BISACODYL 10 MG RE SUPP: 10.0000 mg | Freq: Every day | RECTAL | Status: DC | PRN

## 2022-06-08 MED ORDER — CELECOXIB 200 MG PO CAPS
200.0000 mg | ORAL_CAPSULE | Freq: Two times a day (BID) | ORAL | Status: DC
  Administered 2022-06-08 – 2022-06-09 (×2): 200 mg via ORAL
  Filled 2022-06-08 (×2): qty 1

## 2022-06-08 MED ORDER — TRANEXAMIC ACID-NACL 1000-0.7 MG/100ML-% IV SOLN
INTRAVENOUS | Status: AC
  Filled 2022-06-08: qty 100

## 2022-06-08 SURGICAL SUPPLY — 73 items
ATTUNE MED DOME PAT 41 KNEE (Knees) IMPLANT
ATTUNE PS FEM LT SZ 8 CEM KNEE (Femur) IMPLANT
ATTUNE PSRP INSR SZ8 5 KNEE (Insert) IMPLANT
BASE TIBIAL ROT PLAT SZ 8 KNEE (Knees) IMPLANT
BATTERY INSTRU NAVIGATION (MISCELLANEOUS) ×4 IMPLANT
BLADE CLIPPER SURG (BLADE) IMPLANT
BLADE SAW 70X12.5 (BLADE) ×1 IMPLANT
BLADE SAW 90X13X1.19 OSCILLAT (BLADE) ×1 IMPLANT
BLADE SAW 90X25X1.19 OSCILLAT (BLADE) ×1 IMPLANT
BONE CEMENT GENTAMICIN (Cement) ×2 IMPLANT
BSPLAT TIB 8 CMNT ROT PLAT STR (Knees) ×1 IMPLANT
BTRY SRG DRVR LF (MISCELLANEOUS) ×4
CEMENT BONE GENTAMICIN 40 (Cement) IMPLANT
COOLER POLAR GLACIER W/PUMP (MISCELLANEOUS) ×1 IMPLANT
CUFF TOURN SGL QUICK 24 (TOURNIQUET CUFF)
CUFF TOURN SGL QUICK 34 (TOURNIQUET CUFF)
CUFF TRNQT CYL 24X4X16.5-23 (TOURNIQUET CUFF) IMPLANT
CUFF TRNQT CYL 34X4.125X (TOURNIQUET CUFF) IMPLANT
DRAPE 3/4 80X56 (DRAPES) ×1 IMPLANT
DRAPE INCISE IOBAN 66X45 STRL (DRAPES) IMPLANT
DRSG MEPILEX SACRM 8.7X9.8 (GAUZE/BANDAGES/DRESSINGS) ×1 IMPLANT
DRSG NON-ADHERENT DERMACEA 3X4 (GAUZE/BANDAGES/DRESSINGS) ×1 IMPLANT
DRSG OPSITE POSTOP 4X14 (GAUZE/BANDAGES/DRESSINGS) ×1 IMPLANT
DRSG TEGADERM 4X4.75 (GAUZE/BANDAGES/DRESSINGS) ×1 IMPLANT
DURAPREP 26ML APPLICATOR (WOUND CARE) ×2 IMPLANT
ELECT CAUTERY BLADE 6.4 (BLADE) ×1 IMPLANT
ELECT REM PT RETURN 9FT ADLT (ELECTROSURGICAL) ×1
ELECTRODE REM PT RTRN 9FT ADLT (ELECTROSURGICAL) ×1 IMPLANT
EX-PIN ORTHOLOCK NAV 4X150 (PIN) ×2 IMPLANT
GLOVE BIOGEL M STRL SZ7.5 (GLOVE) ×2 IMPLANT
GLOVE SRG 8 PF TXTR STRL LF DI (GLOVE) ×1 IMPLANT
GLOVE SURG UNDER POLY LF SZ8 (GLOVE) ×1
GOWN STRL REUS W/ TWL LRG LVL3 (GOWN DISPOSABLE) ×1 IMPLANT
GOWN STRL REUS W/TWL LRG LVL3 (GOWN DISPOSABLE) ×1
GOWN TOGA ZIPPER T7+ PEEL AWAY (MISCELLANEOUS) ×1 IMPLANT
HANDLE YANKAUER SUCT OPEN TIP (MISCELLANEOUS) ×1 IMPLANT
HEMOVAC 400CC 10FR (MISCELLANEOUS) ×1 IMPLANT
HOLDER FOLEY CATH W/STRAP (MISCELLANEOUS) ×1 IMPLANT
HOOD PEEL AWAY T7 (MISCELLANEOUS) IMPLANT
IV NS IRRIG 3000ML ARTHROMATIC (IV SOLUTION) ×1 IMPLANT
KIT TURNOVER KIT A (KITS) ×1 IMPLANT
KNIFE SCULPS 14X20 (INSTRUMENTS) ×1 IMPLANT
MANIFOLD NEPTUNE II (INSTRUMENTS) ×2 IMPLANT
NDL SPNL 20GX3.5 QUINCKE YW (NEEDLE) ×2 IMPLANT
NEEDLE SPNL 20GX3.5 QUINCKE YW (NEEDLE) ×2 IMPLANT
NS IRRIG 500ML POUR BTL (IV SOLUTION) ×1 IMPLANT
PACK TOTAL KNEE (MISCELLANEOUS) ×1 IMPLANT
PAD ABD DERMACEA PRESS 5X9 (GAUZE/BANDAGES/DRESSINGS) ×2 IMPLANT
PAD WRAPON POLAR KNEE (MISCELLANEOUS) ×1 IMPLANT
PIN DRILL FIX HALF THREAD (BIT) ×2 IMPLANT
PIN FIXATION 1/8DIA X 3INL (PIN) ×1 IMPLANT
PULSAVAC PLUS IRRIG FAN TIP (DISPOSABLE) ×1
SOL PREP PVP 2OZ (MISCELLANEOUS) ×1
SOLUTION IRRIG SURGIPHOR (IV SOLUTION) ×1 IMPLANT
SOLUTION PREP PVP 2OZ (MISCELLANEOUS) ×1 IMPLANT
SPONGE DRAIN TRACH 4X4 STRL 2S (GAUZE/BANDAGES/DRESSINGS) ×1 IMPLANT
STAPLER SKIN PROX 35W (STAPLE) ×1 IMPLANT
STOCKINETTE IMPERV 14X48 (MISCELLANEOUS) ×1 IMPLANT
STRAP TIBIA SHORT (MISCELLANEOUS) ×1 IMPLANT
SUCTION FRAZIER HANDLE 10FR (MISCELLANEOUS) ×1
SUCTION TUBE FRAZIER 10FR DISP (MISCELLANEOUS) ×1 IMPLANT
SUT VIC AB 0 CT1 36 (SUTURE) ×1 IMPLANT
SUT VIC AB 1 CT1 36 (SUTURE) ×2 IMPLANT
SUT VIC AB 2-0 CT2 27 (SUTURE) ×1 IMPLANT
SYR 30ML LL (SYRINGE) ×2 IMPLANT
TIBIAL BASE ROT PLAT SZ 8 KNEE (Knees) ×1 IMPLANT
TIP FAN IRRIG PULSAVAC PLUS (DISPOSABLE) ×1 IMPLANT
TOWEL OR 17X26 4PK STRL BLUE (TOWEL DISPOSABLE) IMPLANT
TOWER CARTRIDGE SMART MIX (DISPOSABLE) ×1 IMPLANT
TRAP FLUID SMOKE EVACUATOR (MISCELLANEOUS) ×1 IMPLANT
TRAY FOLEY MTR SLVR 16FR STAT (SET/KITS/TRAYS/PACK) ×1 IMPLANT
WATER STERILE IRR 1000ML POUR (IV SOLUTION) ×1 IMPLANT
WRAPON POLAR PAD KNEE (MISCELLANEOUS) ×1

## 2022-06-08 NOTE — Transfer of Care (Signed)
Immediate Anesthesia Transfer of Care Note  Patient: Allen Tapia  Procedure(s) Performed: COMPUTER ASSISTED TOTAL KNEE ARTHROPLASTY (Left: Knee)  Patient Location: PACU  Anesthesia Type:General  Level of Consciousness: awake, alert , and oriented  Airway & Oxygen Therapy: Patient Spontanous Breathing and Patient connected to face mask oxygen  Post-op Assessment: Report given to RN and Post -op Vital signs reviewed and stable  Post vital signs: Reviewed and stable  Last Vitals:  Vitals Value Taken Time  BP 115/60 06/08/22 1425  Temp    Pulse 79 06/08/22 1426  Resp 13 06/08/22 1426  SpO2 98 % 06/08/22 1426  Vitals shown include unvalidated device data.  Last Pain:  Vitals:   06/08/22 1032  TempSrc: Tympanic  PainSc: 0-No pain         Complications: No notable events documented.

## 2022-06-08 NOTE — Anesthesia Procedure Notes (Signed)
Procedure Name: Intubation Date/Time: 06/08/2022 11:06 AM  Performed by: Demetrius Charity, CRNAPre-anesthesia Checklist: Patient identified, Patient being monitored, Timeout performed, Emergency Drugs available and Suction available Patient Re-evaluated:Patient Re-evaluated prior to induction Oxygen Delivery Method: Circle system utilized Preoxygenation: Pre-oxygenation with 100% oxygen Induction Type: IV induction Ventilation: Mask ventilation without difficulty Laryngoscope Size: McGraph and 4 Grade View: Grade I Tube type: Oral Tube size: 7.5 mm Number of attempts: 1 Airway Equipment and Method: Stylet and Video-laryngoscopy Placement Confirmation: ETT inserted through vocal cords under direct vision, positive ETCO2 and breath sounds checked- equal and bilateral Secured at: 22 cm Tube secured with: Tape Dental Injury: Teeth and Oropharynx as per pre-operative assessment

## 2022-06-08 NOTE — TOC Progression Note (Signed)
Transition of Care [facility]) - Progression Note    Patient Details  Name: Allen Tapia MRN: [id number] Date of Birth: 11/07/1955  Transition of Care [facility]) CM/SW [place], RN Phone Number: 06/08/2022, 3:53 PM  Clinical Narrative:      [facility] was openined by the surgeons office for [facility] services      Expected Discharge Plan and Services                                               Social Determinants of Health (SDOH) Interventions SDOH Screenings   Tobacco Use: Medium Risk (06/08/2022)    Readmission Risk Interventions     No data to display

## 2022-06-08 NOTE — Evaluation (Signed)
Physical Therapy Evaluation Patient Details Name: Allen Tapia MRN: [id number] DOB: May 23, 1955 Today's Date: 06/08/2022  History of Present Illness  Pt is a 67 yo M diagnosed with degenerative arthrosis of the left knee and is s/p elective L TKA.  PMH includes MS, R TKA, CAD, R wrist fracture and surgery, BPH, and asthma.   Clinical Impression  Pt was pleasant and motivated to participate during the session and put forth good effort throughout. Pt required cuing for proper sequencing with functional tasks but no physical assistance during the session.  Pt was generally steady with standing activities including limited amb at the EOB and from bed to chair with no overt LOB or L knee instability.  Pt reported no adverse symptoms during the session other than mild L knee pain with SpO2 and HR WNL on 2LO2/min.  Pt is expected to make good progress while in acute care and will benefit from HHPT upon discharge to safely address deficits listed in patient problem list for decreased caregiver assistance and eventual return to PLOF.          Recommendations for follow up therapy are one component of a multi-disciplinary discharge planning process, led by the attending physician.  Recommendations may be updated based on patient status, additional functional criteria and insurance authorization.  Follow Up Recommendations Home health PT      Assistance Recommended at Discharge Intermittent Supervision/Assistance  Patient can return home with the following  A little help with walking and/or transfers;A little help with bathing/dressing/bathroom;Assist for transportation;Help with stairs or ramp for entrance;Assistance with cooking/housework    Equipment Recommendations None recommended by PT  Recommendations for Other Services       Functional Status Assessment Patient has had a recent decline in their functional status and demonstrates the ability to make significant improvements in function in a  reasonable and predictable amount of time.     Precautions / Restrictions Precautions Precautions: Fall Restrictions Weight Bearing Restrictions: Yes RLE Weight Bearing: Weight bearing as tolerated Other Position/Activity Restrictions: Pt able to perform Ind LLE SLR without extensor lag, no KI required      Mobility  Bed Mobility Overal bed mobility: Modified Independent             General bed mobility comments: Min extra time and effort only    Transfers Overall transfer level: Needs assistance Equipment used: Rolling walker (2 wheels) Transfers: Sit to/from Stand Sit to Stand: Min guard           General transfer comment: Min verbal cues for L foot positioning and hand placement    Ambulation/Gait Ambulation/Gait assistance: Min guard Gait Distance (Feet): 5 Feet Assistive device: Rolling walker (2 wheels) Gait Pattern/deviations: Step-to pattern, Decreased step length - right, Decreased stance time - left, Antalgic Gait velocity: decreased     General Gait Details: Step-to pattern mildly antalgic on the LLE but steady with no overt LOB or buckling  Stairs            Wheelchair Mobility    Modified Rankin (Stroke Patients Only)       Balance Overall balance assessment: Needs assistance Sitting-balance support: Feet unsupported, Single extremity supported Sitting balance-Leahy Scale: Good     Standing balance support: Bilateral upper extremity supported, During functional activity Standing balance-Leahy Scale: Good                               Pertinent Vitals/Pain Pain  Assessment Pain Assessment: 0-10 Pain Score: 2  Pain Location: L knee Pain Descriptors / Indicators: Sore Pain Intervention(s): Repositioned, Premedicated before session, Ice applied, Monitored during session    Home Living Family/patient expects to be discharged to:: Private residence Living Arrangements: Spouse/significant other Available Help at  Discharge: Family;Available 24 hours/day Type of Home: House Home Access: Stairs to enter Entrance Stairs-Rails: Left Entrance Stairs-Number of Steps: 4   Home Layout: Two level;Able to live on main level with bedroom/bathroom Home Equipment: Rolling Walker (2 wheels);BSC/3in1      Prior Function Prior Level of Function : Independent/Modified Independent             Mobility Comments: Ind amb community distances without an AD, active including push mowing yard, one fall in the last 6 months secondary to tripping ADLs Comments: Ind with ADLs     Hand Dominance        Extremity/Trunk Assessment   Upper Extremity Assessment Upper Extremity Assessment: Overall WFL for tasks assessed    Lower Extremity Assessment Lower Extremity Assessment: Generalized weakness;LLE deficits/detail LLE Deficits / Details: BLE ankle AROM, strength, and sensation to light touch WNL; L hip flex strength >/= 3/5 LLE: Unable to fully assess due to pain LLE Sensation: WNL LLE Coordination: WNL       Communication   Communication: No difficulties  Cognition Arousal/Alertness: Awake/alert Behavior During Therapy: WFL for tasks assessed/performed Overall Cognitive Status: Within Functional Limits for tasks assessed                                          General Comments      Exercises Total Joint Exercises Ankle Circles/Pumps: AROM, Strengthening, Both, 10 reps Quad Sets: AROM, Strengthening, Left, 5 reps, 10 reps Straight Leg Raises: Strengthening, Left, 5 reps Long Arc Quad: AROM, Strengthening, Left, 5 reps, 10 reps Knee Flexion: AROM, Strengthening, Left, 5 reps, 10 reps Goniometric ROM: L knee AROM: 6-85 deg Marching in Standing: AROM, Strengthening, Both, 5 reps, Standing Other Exercises Other Exercises: LLE positioning education to promote L knee ext PROM and prevent heel pressure Other Exercises: HEP education with focus on L knee QS and seated knee flex per  handout   Assessment/Plan    PT Assessment Patient needs continued PT services  PT Problem List Decreased strength;Decreased range of motion;Decreased activity tolerance;Decreased balance;Decreased mobility;Decreased knowledge of use of DME;Pain       PT Treatment Interventions DME instruction;Gait training;Stair training;Functional mobility training;Therapeutic activities;Therapeutic exercise;Balance training;Patient/family education    PT Goals (Current goals can be found in the Care Plan section)  Acute Rehab PT Goals Patient Stated Goal: To get back to cutting the grass and to be able to get up off of the floor PT Goal Formulation: With patient Time For Goal Achievement: 06/21/22 Potential to Achieve Goals: Good    Frequency BID     Co-evaluation               AM-PAC PT "6 Clicks" Mobility  Outcome Measure Help needed turning from your back to your side while in a flat bed without using bedrails?: A Little Help needed moving from lying on your back to sitting on the side of a flat bed without using bedrails?: A Little Help needed moving to and from a bed to a chair (including a wheelchair)?: A Little Help needed standing up from a chair using your arms (e.g.,  wheelchair or bedside chair)?: A Little Help needed to walk in hospital room?: A Little Help needed climbing 3-5 steps with a railing? : A Lot 6 Click Score: 17    End of Session Equipment Utilized During Treatment: Gait belt Activity Tolerance: Patient tolerated treatment well Patient left: in chair;with call bell/phone within reach;with chair alarm set;with SCD's reapplied;Other (comment) (polar care to L knee) Nurse Communication: Mobility status;Weight bearing status PT Visit Diagnosis: Other abnormalities of gait and mobility (R26.89);Muscle weakness (generalized) (M62.81);Pain Pain - Right/Left: Left Pain - part of body: Knee    Time: 1640-1716 PT Time Calculation (min) (ACUTE ONLY): 36  min   Charges:   PT Evaluation $PT Eval Moderate Complexity: 1 Mod PT Treatments $Therapeutic Exercise: 8-22 mins      D. Royetta Asal PT, DPT 06/08/22, 5:42 PM

## 2022-06-08 NOTE — Progress Notes (Signed)
PT Cancellation Note  Patient Details Name: Allen Tapia MRN: [id number] DOB: [birth year]   Cancelled Treatment:    Reason Eval/Treat Not Completed: Other (comment): Per receiving ortho unit nurse pt remains in PACU preparing for transfer to unit and will need nursing assessment prior to PT evaluation.  Will attempt to see pt at a future tomorrow as medically appropriate.     Linus Salmons PT, DPT 06/08/22, 4:30 PM

## 2022-06-08 NOTE — Anesthesia Preprocedure Evaluation (Signed)
Anesthesia Evaluation  Patient identified by MRN, date of birth, ID band Patient awake    Reviewed: Allergy & Precautions, NPO status , Patient's Chart, lab work & pertinent test results  Airway Mallampati: III  TM Distance: >3 FB Neck ROM: full    Dental  (+) Dental Advidsory Given, Poor Dentition   Pulmonary neg shortness of breath, asthma , neg sleep apnea, neg recent URI, former smoker   Pulmonary exam normal        Cardiovascular (-) angina + CAD  (-) Past MI and (-) CABG Normal cardiovascular exam     Neuro/Psych  PSYCHIATRIC DISORDERS       Neuromuscular disease    GI/Hepatic Neg liver ROS,GERD  ,,  Endo/Other  negative endocrine ROS    Renal/GU      Musculoskeletal   Abdominal   Peds  Hematology negative hematology ROS (+)   Anesthesia Other Findings Patient stated he has a PMH of MS and does not want to do a spinal. Discussed that MS is not an absolute contraindication for a spinal anesthesia but we would proceed with a general anesthesia.  Past Medical History: No date: Anginal pain ([place])     Comment:  atypical chest pain cardiac cath performed at that time No date: Arthritis No date: Asthma No date: Atypical chest pain No date: Barrett's esophagus No date: Bilateral cataracts No date: BPH (benign prostatic hyperplasia) No date: Chicken pox No date: Chronic constipation 07/14/2007: Coronary artery disease     Comment:  a.) LHC 07/14/2007: 40% pRCA-1, 40% pRCA-2, 40% dRCA,               20% RPL1, 30% LM, 20% mLCx-1, 40% mLCx-2, 20% OM3-1, 20%               OM3-2, 20% pLAD, 40% mLAD-1, 70% mLAD-2, 40% mLAD-3, 30%               dLAD, 40% D1, 40% D2 --> med mgmt. No date: Depression No date: Foreign body (FB) in soft tissue No date: Former tobacco use No date: GERD (gastroesophageal reflux disease) 04/07/2019: History of 2019 novel coronavirus disease (COVID-19) No date: History of kidney stones No  date: Hyperlipidemia No date: Hyperplastic colon polyp No date: Infrarenal abdominal aortic aneurysm (AAA) without rupture  ([place])     Comment:  a.) MR lumbar spine 12/30/2020: infrarenal saccular AAA               measuring 2.2 cm; b.) CTA A/P 01/05/2021: measured 2.6               cm; c.) s/p EVAR 03/03/2021 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 No date: Long term current use of immunosuppressive drug     Comment:  a.) on ofatumumab for MS No date: MS (multiple sclerosis) ([place])     Comment:  a.) on ofatumumab No date: Shingles 08/23/2016: Tubular adenoma of colon  Past Surgical History: 03/03/2021: ABDOMINAL AORTIC ENDOVASCULAR STENT GRAFT; N/A     Comment:  Procedure: ABDOMINAL AORTIC ENDOVASCULAR STENT GRAFT;                Surgeon: Waynetta Sandy, MD;  Location: [facility];              Service: Vascular;  Laterality: N/A; 1960: ADENOIDECTOMY 1979: APPENDECTOMY 03/12/2019: CATARACT EXTRACTION, BILATERAL 02/05/2008: COLONOSCOPY 08/23/2016: COLONOSCOPY WITH PROPOFOL; N/A     Comment:  Procedure: COLONOSCOPY WITH PROPOFOL;  Surgeon:  Lollie Sails, MD;  Location[facility] ENDOSCOPY;                Service: Endoscopy;  Laterality: N/A; 04/29/2020: COLONOSCOPY WITH PROPOFOL; N/A     Comment:  Procedure: COLONOSCOPY WITH PROPOFOL;  Surgeon:               Lesly Rubenstein, MD;  Location: [facility] ENDOSCOPY;                Service: Endoscopy;  Laterality: N/A; 05/20/2020: CYSTOSCOPY WITH INSERTION OF UROLIFT; N/A     Comment:  Procedure: CYSTOSCOPY WITH INSERTION OF UROLIFT;                Surgeon: Billey Co, MD;  Location: [facility] ORS;                Service: Urology;  Laterality: N/A; 08/23/2016: ESOPHAGOGASTRODUODENOSCOPY (EGD) WITH PROPOFOL; N/A     Comment:  Procedure: ESOPHAGOGASTRODUODENOSCOPY (EGD) WITH               PROPOFOL;  Surgeon: Lollie Sails, MD;  Location:               [facility] ENDOSCOPY;  Service: Endoscopy;  Laterality: N/A; 04/29/2020:  ESOPHAGOGASTRODUODENOSCOPY (EGD) WITH PROPOFOL; N/A     Comment:  Procedure: ESOPHAGOGASTRODUODENOSCOPY (EGD) WITH               PROPOFOL;  Surgeon: Lesly Rubenstein, MD;  Location:               [facility] ENDOSCOPY;  Service: Endoscopy;  Laterality: N/A; 02/05/2008: ESOPHAGOGASTRODUODENOSCOPY ENDOSCOPY No date: EYE SURGERY 1970: eye trauma     Comment:  piece of metal to eye removed in clinic No date: JOINT REPLACEMENT 10/05/2020: KNEE ARTHROPLASTY; Right     Comment:  Procedure: COMPUTER ASSISTED TOTAL KNEE ARTHROPLASTY;                Surgeon: Dereck Leep, MD;  Location: [facility] ORS;                Service: Orthopedics;  Laterality: Right; 01/02/2011: KNEE ARTHROSCOPY W/ PARTIAL MEDIAL MENISCECTOMY; Right 07/14/2007: LEFT HEART CATH AND CORONARY ANGIOGRAPHY; Left     Comment:  Procedure: LEFT HEART CATH AND CORONARY ANGIOGRAPHY;               Location: [facility]; Surgeon: Doristine Bosworth, MD No date: SEPTOPLASTY 02/05/2008: TONSILLECTOMY 03/03/2021: ULTRASOUND GUIDANCE FOR VASCULAR ACCESS; Left     Comment:  Procedure: ULTRASOUND GUIDANCE FOR VASCULAR ACCESS, LEFT              FEMORAL ARTERY;  Surgeon: Waynetta Sandy, MD;               Location: [facility];  Service: Vascular;  Laterality: Left; No date: WRIST FRACTURE SURGERY; Right  BMI    Body Mass Index: 25.68 kg/m      Reproductive/Obstetrics negative OB ROS                             Anesthesia Physical Anesthesia Plan  ASA: 3  Anesthesia Plan: General ETT   Post-op Pain Management:    Induction: Intravenous  PONV Risk Score and Plan: 2 and Dexamethasone, Ondansetron, Midazolam and Treatment may vary due to age or medical condition  Airway Management Planned: Oral ETT  Additional Equipment:   Intra-op Plan:   Post-operative Plan: Extubation in OR  Informed Consent: I have  reviewed the patients History and Physical, chart, labs and discussed the procedure including the risks,  benefits and alternatives for the proposed anesthesia with the patient or authorized representative who has indicated his/her understanding and acceptance.     Dental Advisory Given  Plan Discussed with: Anesthesiologist, CRNA and Surgeon  Anesthesia Plan Comments: (Patient consented for risks of anesthesia including but not limited to:  - adverse reactions to medications - damage to eyes, teeth, lips or other oral mucosa - nerve damage due to positioning  - sore throat or hoarseness - Damage to heart, brain, nerves, lungs, other parts of body or loss of life  Patient voiced understanding.)        Anesthesia Quick Evaluation

## 2022-06-08 NOTE — H&P (Signed)
ORTHOPAEDIC HISTORY & PHYSICAL Allen Tapia, [place] - 06/05/2022 8:15 AM EDT Formatting of this note is different from the original. Images from the original note were not included.   Chief Complaint: Chief Complaint Patient presents with Pre-op Exam Scheduled for Left TKA 06/15/22 with Dr. Barbara Cower is a 67 y.o. male who presents today for history and physical for left total knee arthroplasty with Dr. Marry Guan on 06/15/2022. Patient has advanced left knee degenerative arthritis. He has had chronic left knee pain that has been progressively increasing in failing response to conservative treatment. Patient has had a left knee arthroscopy with partial medial and lateral meniscectomies and medial chondroplasty. 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 His pain is severe located along the medial joint line. He has swelling and warmth present at the end of the day with no locking. At times his knee will buckle and give way. He wears a knee brace which gives him some relief when he is performing work. Patient has had physical therapy, NSAIDs, Tylenol, cortisone injections as well as viscosupplementation with no improvement. Patient's pain interferes with his quality of life and activities of daily living.  Past Medical History: Past Medical History: Diagnosis Date Atypical chest pain Barrett's esophagus Bilateral cataracts CAD (coronary artery disease) 12/20/2010 Chronic constipation Coronary artery disease FH: ischemic heart disease 12/20/2010 Foreign body 12/20/2010 Former tobacco use GERD (gastroesophageal reflux disease) History of chickenpox Hyperlipidemia Hyperplastic colon polyp 08/23/2016 Multiple sclerosis (CMS-HCC) Shingles Tubular adenoma of colon 08/23/2016  Past Surgical History: Past Surgical History: Procedure Laterality Date [place] UPPER GASTROINTESTINAL ENDOSCOPY 02/05/2008 KNEE ARTHROSCOPY Right 01/02/2011 Right knee arthroscopic, partial meniscectomy. COLONOSCOPY 09/18/2012 [facility] Colon polyps Repeat 09/18/17 UPPER GASTROINTESTINAL ENDOSCOPY 09/18/2012 +Barrett's Esophagus Repeat 09/18/13 UPPER GASTROINTESTINAL ENDOSCOPY 08/31/2013 COLONOSCOPY 08/23/2016 Tubular adenoma of colon/Hyperplastic colon polyp/Repeat 67yrs/MUS EGD 08/23/2016 Gastritis/Negative for Barrett's this bx/Repeat 11yrs/MUS EXTRACTION CATARACT INTRACAPSULAR W/INSERTION INTRAOCULAR PROSTHESIS Left 02/10/2019 Procedure: LENSX/ [facility]-----EXTRACTION CATARACT EXTRACAPSULAR ECCE WITH INSERTION INTRAOCULAR PROSTHESIS; Surgeon: Golden Circle, MD; Location: [facility]; Service: Ophthalmology; Laterality: Left; EXTRACTION CATARACT INTRACAPSULAR W/INSERTION INTRAOCULAR PROSTHESIS Right 03/10/2019 Procedure: LENSX/ [facility]---EXTRACTION CATARACT EXTRACAPSULAR ECCE WITH INSERTION INTRAOCULAR PROSTHESIS; Surgeon: Golden Circle, MD; Location: [facility]; Service: Ophthalmology; Laterality: Right; COLONOSCOPY 04/29/2020 Tubular adenoma/PHx CP/Repeat 22yrs/CTL EGD 04/29/2020 Normal EGD/Repeat as needed/CTL Right total knee arthroplasty using computer-assisted navigation 10/05/2020 Dr Marry Guan Endovascular graft repair of infrarenal abdominal aortic aneurysm 03/03/2021 Dr. Servando Snare Left knee arthroscopy, partial medial and lateral meniscectomies, medial chondroplasty, and debridement of the ACL stump 12/11/2021 Dr Marry Guan CARDIAC CATHETERIZATION COLONOSCOPY 02/05/2008 COLONOSCOPY 02/12/2008 EYE TRAUMA 1970 piece of metal to eye - removed in clinic FRACTURE SURGERY Right Right wrist surgery for fracture. SEPTOPLASTY TONSILLECTOMY  Past Family History: Family History Problem Relation Age of Onset Glaucoma Mother Irregular Heart Beat (Arrhythmia) Mother pacemaker Cardiomyopathy (Abnormal function of the heart muscle)  Mother Colon polyps Mother Cataracts Mother Cancer Father High blood pressure (Hypertension) Father Esophageal cancer Cousin Macular degeneration Neg Hx Blindness Neg Hx Diabetes Neg Hx Anesthesia problems Neg Hx  Medications: Current Outpatient Medications Ordered in Epic Medication Sig Dispense Refill acetaminophen (TYLENOL) 500 MG tablet Take 1,000 mg by mouth as needed for Pain. albuterol 90 mcg/actuation inhaler Inhale 2 inhalations into the lungs every 4 (four) hours as needed 3 each 3 aspirin 81 mg tablet Take 1 tablet by mouth once daily baclofen (LIORESAL) 20 MG tablet  TAKE 1 TABLET THREE TIMES A DAY 270 tablet 3 buPROPion (WELLBUTRIN XL) 150 MG XL tablet TAKE 1 TABLET DAILY 90 tablet 1 celecoxib (CELEBREX) 200 MG capsule Take 1 capsule (200 mg total) by mouth 2 (two) times daily 180 capsule 3 cholecalciferol (VITAMIN D3) 1000 unit tablet Take 3,000 Units by mouth once daily dextroamphetamine-amphetamine (ADDERALL XR) 25 MG XR capsule Take 1 capsule (25 mg total) by mouth every morning 90 capsule 0 gabapentin (NEURONTIN) 300 MG capsule TAKE 1 CAPSULE THREE TIMES A DAY 270 capsule 3 KESIMPTA PEN 20 mg/0.4 mL PnIj Inject 20 mg subcutaneously every 28 (twenty-eight) days 1.2 mL 3 mirabegron (MYRBETRIQ) 50 mg ER tablet Take 50 mg by mouth once daily montelukast (SINGULAIR) 10 mg tablet Take 10 mg by mouth once daily multivitamin with minerals tablet Take 1 tablet by mouth once daily pantoprazole (PROTONIX) 20 MG DR tablet TAKE 1 TABLET DAILY 90 tablet 3 peg 400-propylene glycol, PF, (SYSTANE ULTRA) 0.4-0.3 % ophthalmic drops Place 1 drop into both eyes as needed for Dry Eyes rosuvastatin (CRESTOR) 20 MG tablet Take 20 mg by mouth once daily tamsulosin (FLOMAX) 0.4 mg capsule Take 0.4 mg by mouth once daily traZODone (DESYREL) 100 MG tablet TAKE 2 TABLETS AT BEDTIME 180 tablet 3 budesonide-formoteroL (SYMBICORT) 160-4.5 mcg/actuation inhaler Inhale 2 inhalations into the lungs 2  (two) times daily  No current Epic-ordered facility-administered medications on file.  Allergies: No Known Allergies  Review of Systems: A comprehensive 14 point ROS was performed, reviewed by me today, and the pertinent orthopaedic findings are documented in the HPI.  Exam: BP 122/80  Ht 190.5 cm (6\' 3" )  Wt 92.4 kg (203 lb 12.8 oz)  BMI 25.47 kg/m  General: Well-developed, well-nourished male seen in no acute distress. Antalgic gait.  HEENT: Atraumatic, normocephalic. Pupils are equal and reactive to light. Extraocular motion is intact. Sclera are clear. Oropharynx is clear with moist mucosa.  Neck: Supple, nontender, and with good ROM. No thyromegaly, adenopathy, JVD, or carotid bruits.  Lungs: Clear to auscultation bilaterally.  Cardiovascular: Regular rate and rhythm. Normal S1, S2. No murmur . No appreciable gallops or rubs. Peripheral pulses are palpable. No lower extremity edema. Homan`s test is negative.  Abdomen: Soft, nontender, nondistended. Bowel sounds are present.  Extremities: Good strength, stability, and range of motion of the upper extremities. Good range of motion of the hips and ankles.  Left knee: Examination of the left knee shows minimal swelling no effusion. No warmth redness or wounds. Patient has mild tenderness along the medial joint line and no tenderness on the lateral joint line. Mild laxity with medial stress testing. He has a positive anterior drawer test. Patella tracks well. Good quad tone, 0 to 130 degrees range of motion. Normal strength with ankle plantarflexion, dorsiflexion, knee flexion and extension. Sensation is intact distally.  Imaging: AP lateral and sunrise views of the left knee are ordered and interpreted by me in the office today. Impression: Patient has varus deformity to the left knee with complete loss of joint space in the medial compartment with large cystic change/erosion along the medial femoral condyle. Sclerotic  changes noted in the lateral compartment. Advanced patellofemoral degenerative changes with spurring noted throughout the patellofemoral compartment. Patella tracks slightly lateral in the trochlear groove. No evidence of acute bony abnormality or effusion  Impression: Primary osteoarthritis of left knee [M17.12] Primary osteoarthritis of left knee (primary encounter diagnosis)  Plan: 87. 67 year old male with advanced left knee osteoarthritis. Patient has had years of progressive  knee pain that has failed conservative treatment. Patient's pain interferes with his quality of life and activities daily living. Risks, benefits, complications of a left total knee arthroplasty have been discussed with the patient. Patient has agreed and consented procedure with Dr. Marry Guan on 06/15/2022.  This note was generated in part with voice recognition software and I apologize for any typographical errors that were not detected and corrected.  Allen Tapia MPA-C   Electronically signed by Allen Gottron, PA at 06/05/2022 8:57 AM EDT

## 2022-06-08 NOTE — Anesthesia Postprocedure Evaluation (Signed)
Anesthesia Post Note  Patient: Allen Tapia  Procedure(s) Performed: COMPUTER ASSISTED TOTAL KNEE ARTHROPLASTY (Left: Knee)  Patient location during evaluation: PACU Anesthesia Type: General Level of consciousness: awake and alert Pain management: pain level controlled Vital Signs Assessment: post-procedure vital signs reviewed and stable Respiratory status: spontaneous breathing, nonlabored ventilation, respiratory function stable and patient connected to nasal cannula oxygen Cardiovascular status: blood pressure returned to baseline and stable Postop Assessment: no apparent nausea or vomiting Anesthetic complications: no  No notable events documented.   Last Vitals:  Vitals:   06/08/22 1424 06/08/22 1425  BP: (!) 119/58 115/60  Pulse:  79  Resp:  12  Temp:  (!) 36.2 C  SpO2:  98%    Last Pain:  Vitals:   06/08/22 1425  TempSrc:   PainSc: [place]

## 2022-06-08 NOTE — Op Note (Signed)
OPERATIVE NOTE  DATE OF SURGERY:  06/08/2022  PATIENT NAME:  JANZIEL PACH   DOB: [birth year]  MRN: [id number]  PRE-OPERATIVE DIAGNOSIS: Degenerative arthrosis of the left knee, primary  POST-OPERATIVE DIAGNOSIS:  Same  PROCEDURE:  Left total knee arthroplasty using computer-assisted navigation  SURGEON:  Marciano Sequin. M.D.  ANESTHESIA: general  ESTIMATED BLOOD LOSS: 50 mL  FLUIDS REPLACED: 1100 mL of crystalloid  TOURNIQUET TIME: 77 minutes  DRAINS: 2 medium Hemovac drains  SOFT TISSUE RELEASES: Anterior cruciate ligament, posterior cruciate ligament, deep medial collateral ligament, patellofemoral ligament  IMPLANTS UTILIZED: DePuy Attune size 8 posterior stabilized femoral component (cemented), size 8 rotating platform tibial component (cemented), 41 mm medialized dome patella (cemented), and a 5 mm stabilized rotating platform polyethylene insert.  INDICATIONS FOR SURGERY: WOODWARD TEPLY is a 67 y.o. year old male with a long history of progressive knee pain. X-rays demonstrated severe degenerative changes in tricompartmental fashion. The patient had not seen any significant improvement despite conservative nonsurgical intervention. After discussion of the risks and benefits of surgical intervention, the patient expressed understanding of the risks benefits and agree with plans for total knee arthroplasty.   The risks, benefits, and alternatives were discussed at length including but not limited to the risks of infection, bleeding, nerve injury, stiffness, blood clots, the need for revision surgery, cardiopulmonary complications, among others, and they were willing to proceed.  PROCEDURE IN DETAIL: The patient was brought into the operating room and, after adequate general anesthesia was achieved, a tourniquet was placed on the patient's upper thigh. The patient's knee and leg were cleaned and prepped with alcohol and DuraPrep and draped in the usual sterile fashion. A  "timeout" was performed as per usual protocol. The lower extremity was exsanguinated using an Esmarch, and the tourniquet was inflated to 300 mmHg. An anterior longitudinal incision was made followed by a standard mid vastus approach. The deep fibers of the medial collateral ligament were elevated in a subperiosteal fashion off of the medial flare of the tibia so as to maintain a continuous soft tissue sleeve. The patella was subluxed laterally and the patellofemoral ligament was incised. Inspection of the knee demonstrated severe degenerative changes with full-thickness loss of articular cartilage. Osteophytes were debrided using a rongeur. Anterior and posterior cruciate ligaments were excised. Two 4.0 mm Schanz pins were inserted in the femur and into the tibia for attachment of the array of trackers used for computer-assisted navigation. Hip center was identified using a circumduction technique. Distal landmarks were mapped using the computer. The distal femur and proximal tibia were mapped using the computer. The distal femoral cutting guide was positioned using computer-assisted navigation so as to achieve a 5 distal valgus cut. The femur was sized and it was felt that a size 8 femoral component was appropriate. A size 8 femoral cutting guide was positioned and the anterior cut was performed and verified using the computer. This was followed by completion of the posterior and chamfer cuts. Femoral cutting guide for the central box was then positioned in the center box cut was performed.  Attention was then directed to the proximal tibia. Medial and lateral menisci were excised. The extramedullary tibial cutting guide was positioned using computer-assisted navigation so as to achieve a 0 varus-valgus alignment and 3 posterior slope. The cut was performed and verified using the computer. The proximal tibia was sized and it was felt that a size 8 tibial tray was appropriate. Tibial and femoral trials were  inserted followed  by insertion of a 5 mm polyethylene insert. This allowed for excellent mediolateral soft tissue balancing both in flexion and in full extension. Finally, the patella was cut and prepared so as to accommodate a 41 mm medialized dome patella. A patella trial was placed and the knee was placed through a range of motion with excellent patellar tracking appreciated. The femoral trial was removed after debridement of posterior osteophytes. The central post-hole for the tibial component was reamed followed by insertion of a keel punch. Tibial trials were then removed. Cut surfaces of bone were irrigated with copious amounts of normal saline using pulsatile lavage and then suctioned dry. Polymethylmethacrylate cement with gentamicin was prepared in the usual fashion using a vacuum mixer. Cement was applied to the cut surface of the proximal tibia as well as along the undersurface of a size 8 rotating platform tibial component. Tibial component was positioned and impacted into place. Excess cement was removed using [employer] fast streamer. Cement was then applied to the cut surfaces of the femur as well as along the posterior flanges of the size 8 femoral component. The femoral component was positioned and impacted into place. Excess cement was removed using [employer] fast streamer. A 5 mm polyethylene trial was inserted and the knee was brought into full extension with steady axial compression applied. Finally, cement was applied to the backside of a 41 mm medialized dome patella and the patellar component was positioned and patellar clamp applied. Excess cement was removed using [employer] fast streamer. After adequate curing of the cement, the tourniquet was deflated after a total tourniquet time of 77 minutes. Hemostasis was achieved using electrocautery. The knee was irrigated with copious amounts of normal saline using pulsatile lavage followed by 450 ml of Surgiphor and then suctioned dry. 20 mL of 1.3% Exparel and 60 mL of  0.25% Marcaine in 40 mL of normal saline was injected along the posterior capsule, medial and lateral gutters, and along the arthrotomy site. A 5 mm stabilized rotating platform polyethylene insert was inserted and the knee was placed through a range of motion with excellent mediolateral soft tissue balancing appreciated and excellent patellar tracking noted. 2 medium drains were placed in the wound bed and brought out through separate stab incisions. The medial parapatellar portion of the incision was reapproximated using interrupted sutures of #1 Vicryl. Subcutaneous tissue was approximated in layers using first #0 Vicryl followed #2-0 Vicryl. The skin was approximated with skin staples. A sterile dressing was applied.  The patient tolerated the procedure well and was transported to the recovery room in stable condition.    Mikenzie Mccannon P. Holley Bouche., M.D.

## 2022-06-08 NOTE — Interval H&P Note (Signed)
History and Physical Interval Note:  06/08/2022 10:32 AM  Allen Tapia  has presented today for surgery, with the diagnosis of PRIMARY OSTEOARTHRITIS OF LEFT KNEE..  The various methods of treatment have been discussed with the patient and family. After consideration of risks, benefits and other options for treatment, the patient has consented to  Procedure(s): COMPUTER ASSISTED TOTAL KNEE ARTHROPLASTY - RNFA (Left) as a surgical intervention.  The patient's history has been reviewed, patient examined, no change in status, stable for surgery.  I have reviewed the patient's chart and labs.  Questions were answered to the patient's satisfaction.     [place]

## 2022-06-09 DIAGNOSIS — M1712 Unilateral primary osteoarthritis, left knee: Secondary | ICD-10-CM | POA: Diagnosis not present

## 2022-06-09 MED ORDER — ONDANSETRON HCL 4 MG PO TABS: 4.0000 mg | ORAL_TABLET | Freq: Four times a day (QID) | ORAL | 0 refills | Status: DC | PRN

## 2022-06-09 MED ORDER — ENOXAPARIN SODIUM 40 MG/0.4ML IJ SOSY: 40.0000 mg | PREFILLED_SYRINGE | INTRAMUSCULAR | 0 refills | Status: DC

## 2022-06-09 MED ORDER — TRAMADOL HCL 50 MG PO TABS: 50.0000 mg | ORAL_TABLET | ORAL | 0 refills | Status: DC | PRN

## 2022-06-09 MED ORDER — ACETAMINOPHEN 500 MG PO TABS: 1000.0000 mg | ORAL_TABLET | Freq: Four times a day (QID) | ORAL | 0 refills | Status: AC | PRN

## 2022-06-09 MED ORDER — GABAPENTIN 300 MG PO CAPS: 300.0000 mg | ORAL_CAPSULE | Freq: Two times a day (BID) | ORAL | 0 refills | Status: AC

## 2022-06-09 MED ORDER — OXYCODONE HCL 5 MG PO TABS: 5.0000 mg | ORAL_TABLET | ORAL | 0 refills | Status: DC | PRN

## 2022-06-09 NOTE — Plan of Care (Signed)
  Problem: Education: Goal: Knowledge of General Education information will improve Description: Including pain rating scale, medication(s)/side effects and non-pharmacologic comfort measures Outcome: Progressing   Problem: Health Behavior/Discharge Planning: Goal: Ability to manage health-related needs will improve Outcome: Progressing   Problem: Clinical Measurements: Goal: Ability to maintain clinical measurements within normal limits will improve Outcome: Progressing Goal: Will remain free from infection Outcome: Progressing Goal: Diagnostic test results will improve Outcome: Progressing Goal: Cardiovascular complication will be avoided Outcome: Progressing   Problem: Activity: Goal: Risk for activity intolerance will decrease Outcome: Progressing   Problem: Nutrition: Goal: Adequate nutrition will be maintained Outcome: Progressing   Problem: Elimination: Goal: Will not experience complications related to bowel motility Outcome: Progressing Goal: Will not experience complications related to urinary retention Outcome: Progressing

## 2022-06-09 NOTE — Progress Notes (Signed)
Physical Therapy Treatment Patient Details Name: Allen Tapia MRN: [id number] DOB: 1955-04-06 Today's Date: 06/09/2022   History of Present Illness Pt is a 67 yo M diagnosed with degenerative arthrosis of the left knee and is s/p elective L TKA.  PMH includes MS, R TKA, CAD, R wrist fracture and surgery, BPH, and asthma.    PT Comments    Pt tolerated session well this am. Good demonstration of progressive gait 200+ft with RW and step through pattern. 2/10 L knee pain with good AROM 2-110. Pt able to negotiate 4 steps with single rail and good recall on technique. Education provided on proper positioning, HEP, stair training, and car transfers. Pt appears functionally ready for d/c home with initial HHPT services.   Recommendations for follow up therapy are one component of a multi-disciplinary discharge planning process, led by the attending physician.  Recommendations may be updated based on patient status, additional functional criteria and insurance authorization.  Follow Up Recommendations  Home health PT     Assistance Recommended at Discharge Intermittent Supervision/Assistance  Patient can return home with the following A little help with walking and/or transfers;A little help with bathing/dressing/bathroom;Assist for transportation;Help with stairs or ramp for entrance;Assistance with cooking/housework   Equipment Recommendations  None recommended by PT    Recommendations for Other Services       Precautions / Restrictions Precautions Precautions: Fall Restrictions Weight Bearing Restrictions: Yes RLE Weight Bearing: Weight bearing as tolerated Other Position/Activity Restrictions: Pt able to perform Ind LLE SLR without extensor lag, no KI required     Mobility  Bed Mobility               General bed mobility comments: NT    Transfers Overall transfer level: Needs assistance Equipment used: Rolling walker (2 wheels) Transfers: Sit to/from Stand Sit to  Stand: Supervision           General transfer comment: Min verbal cues for L foot positioning and hand placement    Ambulation/Gait Ambulation/Gait assistance: Supervision, Modified independent (Device/Increase time) Gait Distance (Feet): 200 Feet Assistive device: Rolling walker (2 wheels) Gait Pattern/deviations: Step-through pattern, Decreased stance time - left       General Gait Details: VC's to promote "step through" gait pattern with good demo/understanding   Stairs Stairs: Yes Stairs assistance: Supervision Stair Management: One rail Left, Step to pattern Number of Stairs: 4 General stair comments:  (Good recall on proper technique)   Wheelchair Mobility    Modified Rankin (Stroke Patients Only)       Balance Overall balance assessment: Needs assistance Sitting-balance support: No upper extremity supported, Feet supported Sitting balance-Leahy Scale: Normal     Standing balance support: No upper extremity supported, During functional activity Standing balance-Leahy Scale: Good                              Cognition Arousal/Alertness: Awake/alert Behavior During Therapy: WFL for tasks assessed/performed Overall Cognitive Status: Within Functional Limits for tasks assessed                                 General Comments: Impulsive at times, education provided to increase safety        Exercises Total Joint Exercises Ankle Circles/Pumps: AROM, Strengthening, Both, 10 reps Quad Sets: AROM, Strengthening, Left, 5 reps Straight Leg Raises: Strengthening, Left, 5 reps Long Arc Quad: AROM, Strengthening,  Left, 5 reps Knee Flexion: AROM, Strengthening, Left, 5 reps, 10 reps Goniometric ROM: AROM L knee 2-110 seated    General Comments General comments (skin integrity, edema, etc.):  (Left knee dressing intact, education provided on proper gait sequencing, strengthening exercises, positioning, stair training, and car  transfers)      Pertinent Vitals/Pain Pain Assessment Pain Assessment: 0-10 Pain Score: 2  Pain Location: L knee Pain Descriptors / Indicators: Sore Pain Intervention(s): Limited activity within patient's tolerance, Ice applied    Home Living Family/patient expects to be discharged to:: Private residence Living Arrangements: Spouse/significant other Available Help at Discharge: Family;Available 24 hours/day Type of Home: House Home Access: Stairs to enter Entrance Stairs-Rails: Left Entrance Stairs-Number of Steps: 4   Home Layout: Two level;Able to live on main level with bedroom/bathroom Home Equipment: [place] (2 wheels);BSC/3in1;Shower seat - built in      Prior Function            PT Goals (current goals can now be found in the care plan section) Acute Rehab PT Goals Patient Stated Goal: To get back to cutting the grass and to be able to get up off of the floor Progress towards PT goals: Progressing toward goals    Frequency    BID      PT Plan Current plan remains appropriate    Co-evaluation              AM-PAC PT "6 Clicks" Mobility   Outcome Measure  Help needed turning from your back to your side while in a flat bed without using bedrails?: A Little Help needed moving from lying on your back to sitting on the side of a flat bed without using bedrails?: A Little Help needed moving to and from a bed to a chair (including a wheelchair)?: A Little Help needed standing up from a chair using your arms (e.g., wheelchair or bedside chair)?: A Little Help needed to walk in hospital room?: A Little Help needed climbing 3-5 steps with a railing? : A Little 6 Click Score: 18    End of Session Equipment Utilized During Treatment: Gait belt Activity Tolerance: Patient tolerated treatment well Patient left: in chair;with call bell/phone within reach Nurse Communication: Mobility status PT Visit Diagnosis: Other abnormalities of gait and mobility  (R26.89);Muscle weakness (generalized) (M62.81);Pain Pain - Right/Left: Left Pain - part of body: Knee     Time: 1123-1202 PT Time Calculation (min) (ACUTE ONLY): 39 min  Charges:  $Gait Training: 8-22 mins $Therapeutic Exercise: 8-22 mins $Therapeutic Activity: 8-22 mins                    Mikel Cella, PTA   Allen Tapia 06/09/2022, 12:14 PM

## 2022-06-09 NOTE — Progress Notes (Signed)
Subjective: 1 Day Post-Op Procedure(s) (LRB): COMPUTER ASSISTED TOTAL KNEE ARTHROPLASTY (Left) Patient reports pain as mild.   Patient is well, and has had no acute complaints or problems Plan is to go Home after hospital stay. Negative for chest pain and shortness of breath Fever: no Gastrointestinal:Negative for nausea and vomiting Initially was having difficulty with urination but has been able to urinate today.  Objective: Vital signs in last 24 hours: Temp:  [97.2 F (36.2 C)-98.9 F (37.2 C)] 98.2 F (36.8 C) (03/16 0716) Pulse Rate:  [58-86] 66 (03/16 0716) Resp:  [10-19] 18 (03/16 0716) BP: (111-148)/(58-84) 114/63 (03/16 0716) SpO2:  [91 %-98 %] 95 % (03/16 0716) Weight:  [90.7 kg] 90.7 kg (03/15 1032)  Intake/Output from previous day:  Intake/Output Summary (Last 24 hours) at 06/09/2022 0943 Last data filed at 06/09/2022 0831 Gross per 24 hour  Intake 2521.94 ml  Output 3970 ml  Net -1448.06 ml    Intake/Output this shift: Total I/O In: -  Out: 300 [Urine:300]  Labs: Recent Labs    06/06/22 1054  HGB 15.0   Recent Labs    06/06/22 1054  WBC 5.4  RBC 4.91  HCT 45.2  PLT 219   Recent Labs    06/06/22 1054  NA 138  K 4.1  CL 106  CO2 27  BUN 13  CREATININE 0.74  GLUCOSE 115*  CALCIUM 9.1   No results for input(s): "LABPT", "INR" in the last 72 hours.   EXAM General - Patient is Alert, Appropriate, and Oriented Extremity - ABD soft Neurovascular intact Dorsiflexion/Plantar flexion intact Incision: dressing C/D/I No cellulitis present Compartment soft Dressing/Incision - Bulky dressing removed today.  [place] with mild bloody drainage noted. Hemovac with mild bloody drainage, hemovac was removed this morning without issue, 4x4 with tegaderm applied. Motor Function - intact, moving foot and toes well on exam.  Abdomen soft with intact bowel sounds.  Past Medical History:  Diagnosis Date   Anginal pain ([place])    atypical chest pain  cardiac cath performed at that time   Arthritis    Asthma    Atypical chest pain    Barrett's esophagus    Bilateral cataracts    BPH (benign prostatic hyperplasia)    Chicken pox    Chronic constipation    Coronary artery disease 07/14/2007   a.) LHC 07/14/2007: 40% pRCA-1, 40% pRCA-2, 40% dRCA, 20% RPL1, 30% LM, 20% mLCx-1, 40% mLCx-2, 20% OM3-1, 20% OM3-2, 20% pLAD, 40% mLAD-1, 70% mLAD-2, 40% mLAD-3, 30% dLAD, 40% D1, 40% D2 --> med mgmt.   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 rupture ([place])    a.) MR lumbar spine 12/30/2020: infrarenal saccular AAA measuring 2.2 cm; b.) CTA A/P 01/05/2021: measured 2.6 cm; c.) s/p EVAR 03/03/2021   Ischemic heart disease 12/20/2010   Long term current use of immunosuppressive drug    a.) on ofatumumab for MS   MS (multiple sclerosis) ([place])    a.) on ofatumumab   Shingles    Tubular adenoma of colon 08/23/2016    Assessment/Plan: 1 Day Post-Op  Procedure(s) (LRB): COMPUTER ASSISTED TOTAL KNEE ARTHROPLASTY (Left) Principal Problem:   Total knee replacement status  Estimated body mass index is 27.12 kg/m as calculated from the following:   Height as of this encounter: 6' (1.829 m).   Weight as of this encounter: 90.7 kg. Advance diet Up with therapy D/C IV fluids when tolerating po intake.  Vitals reviewed this AM. 320cc drainage reported to the hemovac, removed without issue this morning. Patient is urinating since surgery. Up with therapy today. Plan for discharge home pending completion of PT.  DVT Prophylaxis - Lovenox, TED hose, and SCDs Weight-Bearing as  tolerated to right leg  J. Cameron Proud, PA-C [facility] Orthopaedic Surgery 06/09/2022, 9:43 AM

## 2022-06-09 NOTE — Evaluation (Signed)
Occupational Therapy Evaluation Patient Details Name: Allen Tapia MRN: [id number] DOB: 1955/08/25 Today's Date: 06/09/2022   History of Present Illness Pt is a 67 yo M diagnosed with degenerative arthrosis of the left knee and is s/p elective L TKA.  PMH includes MS, R TKA, CAD, R wrist fracture and surgery, BPH, and asthma.   Clinical Impression   Mr Arntzen was seen for OT evaluation this date. Prior to hospital admission, pt was IND. Pt lives with spouse. Pt presents to acute OT demonstrating impaired ADL performance and functional mobility 2/2 decreased activity tolerance. Pt currently requires SUPERVISION + RW for simulated toilet t/f and tooth brushing standing sink side. MIN cues for RW technique. MOD I don underwear/shorts, good recall of adapted techniques from prior surgery. Pt instructed in polar care mgt and compression stocking mgt with excellent recall. All education complete, will sign off. Upon hospital discharge, recommend no OT follow up.    Recommendations for follow up therapy are one component of a multi-disciplinary discharge planning process, led by the attending physician.  Recommendations may be updated based on patient status, additional functional criteria and insurance authorization.   Follow Up Recommendations  No OT follow up     Assistance Recommended at Discharge Set up Supervision/Assistance  Patient can return home with the following Help with stairs or ramp for entrance    Functional Status Assessment  Patient has had a recent decline in their functional status and demonstrates the ability to make significant improvements in function in a reasonable and predictable amount of time.  Equipment Recommendations  None recommended by OT    Recommendations for Other Services       Precautions / Restrictions Precautions Precautions: Fall Restrictions Weight Bearing Restrictions: Yes RLE Weight Bearing: Weight bearing as tolerated      Mobility Bed  Mobility               General bed mobility comments: NT    Transfers Overall transfer level: Needs assistance Equipment used: Rolling walker (2 wheels) Transfers: Sit to/from Stand Sit to Stand: Supervision                  Balance Overall balance assessment: Needs assistance Sitting-balance support: No upper extremity supported, Feet supported Sitting balance-Leahy Scale: Normal     Standing balance support: No upper extremity supported, During functional activity Standing balance-Leahy Scale: Good                             ADL either performed or assessed with clinical judgement   ADL Overall ADL's : Needs assistance/impaired                                       General ADL Comments: SUPERVISION + RW for simulated toilet t/f and tooth brushing standing sink side. MOD I don underwear/shorts.      Pertinent Vitals/Pain Pain Assessment Pain Assessment: 0-10 Pain Score: 2  Pain Location: L knee Pain Descriptors / Indicators: Sore Pain Intervention(s): Limited activity within patient's tolerance, Repositioned     Hand Dominance     Extremity/Trunk Assessment Upper Extremity Assessment Upper Extremity Assessment: Overall WFL for tasks assessed   Lower Extremity Assessment Lower Extremity Assessment: Generalized weakness       Communication Communication Communication: No difficulties   Cognition Arousal/Alertness: Awake/alert Behavior During Therapy: WFL for tasks  assessed/performed Overall Cognitive Status: Within Functional Limits for tasks assessed                                                  Home Living Family/patient expects to be discharged to:: Private residence Living Arrangements: Spouse/significant other Available Help at Discharge: Family;Available 24 hours/day Type of Home: House Home Access: Stairs to enter [employer] of Steps: 4 Entrance Stairs-Rails:  Left Home Layout: Two level;Able to live on main level with bedroom/bathroom         Bathroom Toilet: Handicapped height     Home Equipment: Conservation officer, nature (2 wheels);BSC/3in1;Shower seat - built in          Prior Functioning/Environment Prior Level of Function : Independent/Modified Independent             Mobility Comments: Ind amb community distances without an AD, active including push mowing yard, one fall in the last 6 months secondary to tripping ADLs Comments: Ind with ADLs        OT Problem List: Decreased strength;Decreased activity tolerance         OT Goals(Current goals can be found in the care plan section) Acute Rehab OT Goals Patient Stated Goal: to go home OT Goal Formulation: With patient Time For Goal Achievement: 06/23/22 Potential to Achieve Goals: Good   AM-PAC OT "6 Clicks" Daily Activity     Outcome Measure Help from another person eating meals?: None Help from another person taking care of personal grooming?: None Help from another person toileting, which includes using toliet, bedpan, or urinal?: None Help from another person bathing (including washing, rinsing, drying)?: A Little Help from another person to put on and taking off regular upper body clothing?: None Help from another person to put on and taking off regular lower body clothing?: None 6 Click Score: 23   End of Session Equipment Utilized During Treatment: Rolling walker (2 wheels)  Activity Tolerance: Patient tolerated treatment well Patient left: in chair;with call bell/phone within reach;with chair alarm set  OT Visit Diagnosis: Unsteadiness on feet (R26.81)                Time: [id number] OT Time Calculation (min): 16 min Charges:  OT General Charges $OT Visit: 1 Visit OT Evaluation $OT Eval Low Complexity: 1 Low   Dessie Coma, M.S. OTR/L  06/09/22, 8:29 AM  ascom [phone]

## 2022-06-09 NOTE — Discharge Summary (Signed)
Physician Discharge Summary  Patient ID: Allen Tapia MRN: [id number] DOB/AGE: 68-Apr-1957 68 y.o.  Admit date: 06/08/2022 Discharge date: 06/09/2022  Admission Diagnoses:  Total knee replacement status [Z96.659] Degenerative arthrosis of the left knee  Discharge Diagnoses: Patient Active Problem List   Diagnosis Date Noted   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/08/2022   Left anterior cruciate ligament tear 04/13/2022   Primary osteoarthritis of left knee 04/13/2022   Prediabetes 09/19/2021   AAA (abdominal aortic aneurysm) ([place]) 03/03/2021   AAA (abdominal aortic aneurysm) without rupture ([place]) 01/06/2021   Status post total right knee replacement 10/05/2020   Mild intermittent asthma 12/07/2019   Pure hypercholesterolemia 05/27/2019   Status post laser cataract surgery, left 02/11/2019   Barrett's esophagus 10/10/2016   Chronic constipation 10/10/2016   Ankylosis of right elbow joint 04/11/2015   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   Encounter for long-term (current) use of other medications 06/08/2013   Dyspnea, unspecified 04/07/2012   Multiple sclerosis ([place]) 01/31/2012   Acid reflux 12/20/2010   CAD (coronary artery disease) 12/20/2010   Depression 12/20/2010   Family history of heart disease 12/20/2010   Foreign body 12/20/2010   Hyperlipemia 12/20/2010   Tobacco abuse 12/20/2010    Past Medical History:  Diagnosis Date   Anginal pain ([place])    atypical chest pain cardiac cath performed at that time   Arthritis    Asthma    Atypical chest pain    Barrett's esophagus    Bilateral cataracts    BPH (benign prostatic hyperplasia)    Chicken pox    Chronic constipation    Coronary artery disease 07/14/2007   a.) LHC 07/14/2007: 40% pRCA-1, 40% pRCA-2, 40% dRCA, 20% RPL1, 30% LM, 20% mLCx-1, 40% mLCx-2, 20% OM3-1, 20% OM3-2, 20% pLAD, 40% mLAD-1, 70% mLAD-2, 40% mLAD-3, 30% dLAD, 40% D1, 40% D2 --> med mgmt.    Depression    Foreign body (FB) in soft tissue    Former tobacco use    GERD (gastroesophageal reflux disease)    History of 2019 novel coronavirus disease (COVID-19) 04/07/2019   a.) tested (+) at [facility] 04/16/2019, 05/30/2021; b.) 2 other (+) tests at home (dates unknown)   History of kidney stones    Hyperlipidemia    Hyperplastic colon polyp    Infrarenal abdominal aortic aneurysm (AAA) without rupture ([place])    a.) MR lumbar spine 12/30/2020: infrarenal saccular AAA measuring 2.2 cm; b.) CTA A/P 01/05/2021: measured 2.6 cm; c.) s/p EVAR 03/03/2021   Ischemic heart disease 12/20/2010   Long term current use of immunosuppressive drug    a.) on ofatumumab for MS   MS (multiple sclerosis) ([place])    a.) on ofatumumab   Shingles    Tubular adenoma of colon 08/23/2016     Transfusion: None.   Consultants (if any):   Discharged Condition: Improved  Hospital Course: Allen Tapia is an 67 y.o. male who was admitted 06/08/2022 with a diagnosis of degenerative arthrosis of the left knee and went to the operating room on 06/08/2022 and underwent the above named procedures.    Surgeries: Procedure(s): COMPUTER ASSISTED TOTAL KNEE ARTHROPLASTY on 06/08/2022 Patient tolerated the surgery well. Taken to PACU where she was stabilized and then transferred to the orthopedic floor.  Started on Lovenox 30mg  q 12 hrs. Heels elevated on bed with rolled towels. No evidence of DVT. Negative Homan. Physical therapy started on day #1 for gait training and transfer.  OT started day #1 for ADL and assisted devices.  Patient's IV and hemovac were removed on POD1.  Foley was removed shortly after completion of surgery.  Implants: DePuy Attune size 8 posterior stabilized femoral component (cemented), size 8 rotating platform tibial component (cemented), 41 mm medialized dome patella (cemented), and a 5 mm stabilized rotating platform polyethylene insert.   He was given perioperative antibiotics:   Anti-infectives (From admission, onward)    Start     Dose/Rate Route Frequency Ordered Stop   06/08/22 1700  ceFAZolin (ANCEF) IVPB 2g/100 mL premix        2 g 200 mL/hr over 30 Minutes Intravenous Every 6 hours 06/08/22 1609 06/09/22 0054   06/08/22 1000  ceFAZolin (ANCEF) 2-4 GM/100ML-% IVPB       Note to Pharmacy: [facility]: cabinet override      06/08/22 1000 06/08/22 1115   06/08/22 0600  ceFAZolin (ANCEF) IVPB 2g/100 mL premix        2 g 200 mL/hr over 30 Minutes Intravenous On call to O.R. 06/07/22 2156 06/08/22 1120     .  He was given sequential compression devices, early ambulation, and Lovenox for DVT prophylaxis.  He benefited maximally from the hospital stay and there were no complications.    Recent vital signs:  Vitals:   06/09/22 0414 06/09/22 0716  BP: (!) 111/58 114/63  Pulse: 73 66  Resp: 16 18  Temp: 98.1 F (36.7 C) 98.2 F (36.8 C)  SpO2: 92% 95%    Recent laboratory studies:  Lab Results  Component Value Date   HGB 15.0 06/06/2022   HGB 13.0 03/04/2021   HGB 12.9 (L) 03/03/2021   Lab Results  Component Value Date   WBC 5.4 06/06/2022   PLT 219 06/06/2022   Lab Results  Component Value Date   INR 0.9 03/03/2021   Lab Results  Component Value Date   NA 138 06/06/2022   K 4.1 06/06/2022   CL 106 06/06/2022   CO2 27 06/06/2022   BUN 13 06/06/2022   CREATININE 0.74 06/06/2022   GLUCOSE 115 (H) 06/06/2022    Discharge Medications:   Allergies as of 06/09/2022   No Known Allergies      Medication List     STOP taking these medications    aspirin EC 81 MG tablet       TAKE these medications    acetaminophen 500 MG tablet Commonly known as: TYLENOL Take 2 tablets (1,000 mg total) by mouth every 6 (six) hours as needed for mild pain. What changed:  how much to take when to take this reasons to take this   AeroChamber MV inhaler Use as instructed   albuterol 108 (90 Base) MCG/ACT inhaler Commonly known as:  VENTOLIN HFA Inhale 2 puffs into the lungs every 6 (six) hours as needed for wheezing or shortness of breath.   amphetamine-dextroamphetamine 25 MG 24 hr capsule Commonly known as: ADDERALL XR Take 25 mg by mouth daily as needed (focus).   baclofen 20 MG tablet Commonly known as: LIORESAL Take 20 mg by mouth 3 (three) times daily.   budesonide-formoterol 160-4.5 MCG/ACT inhaler Commonly known as: Symbicort Inhale 1-2 puffs into the lungs in the morning and at bedtime.   buPROPion 150 MG 24 hr tablet Commonly known as: WELLBUTRIN XL Take 150 mg by mouth every morning.   celecoxib 200 MG capsule Commonly known as: CELEBREX Take 200 mg by mouth 2 (two) times daily.   enoxaparin 40  MG/0.4ML injection Commonly known as: LOVENOX Inject 0.4 mLs (40 mg total) into the skin daily.   gabapentin 300 MG capsule Commonly known as: NEURONTIN Take 1-2 capsules (300-600 mg total) by mouth 2 (two) times daily. 600 mg in am and 300 mg in afternoon What changed: how much to take   Gemtesa 75 MG Tabs Generic drug: Vibegron Take 75 mg by mouth daily.   Kesimpta 20 MG/0.4ML Soaj Generic drug: Ofatumumab Inject 1 Dose into the skin every 30 (thirty) days. 1st day of every month   montelukast 10 MG tablet Commonly known as: SINGULAIR Take 1 tablet (10 mg total) by mouth at bedtime.   multivitamin with minerals tablet Take 1 tablet by mouth daily.   Myrbetriq 50 MG Tb24 tablet Generic drug: mirabegron ER Take 50 mg by mouth every morning.   ondansetron 4 MG tablet Commonly known as: ZOFRAN Take 1 tablet (4 mg total) by mouth every 6 (six) hours as needed for nausea.   oxyCODONE 5 MG immediate release tablet Commonly known as: Oxy IR/ROXICODONE Take 1 tablet (5 mg total) by mouth every 4 (four) hours as needed for severe pain.   pantoprazole 20 MG tablet Commonly known as: PROTONIX Take 1 tablet by mouth at bedtime.   rosuvastatin 20 MG tablet Commonly known as: CRESTOR TAKE 1  TABLET AT BEDTIME   Systane 0.4-0.3 % Soln Generic drug: Polyethyl Glycol-Propyl Glycol Apply 1 drop to eye 2 (two) times daily.   tamsulosin 0.4 MG Caps capsule Commonly known as: FLOMAX Take 1 capsule (0.4 mg total) by mouth daily. What changed: when to take this   traMADol 50 MG tablet Commonly known as: ULTRAM Take 1-2 tablets (50-100 mg total) by mouth every 4 (four) hours as needed for moderate pain.   traZODone 100 MG tablet Commonly known as: DESYREL Take 100 mg by mouth at bedtime.   VITAMIN D PO Take 1 capsule by mouth daily.               Durable Medical Equipment  (From admission, onward)           Start     Ordered   06/08/22 1650  DME Walker rolling  Once       Question:  Patient needs a walker to treat with the following condition  Answer:  Total knee replacement status   06/08/22 1650   06/08/22 1650  DME Bedside commode  Once       Comments: Patient is not able to walk the distance required to go the bathroom, or he/she is unable to safely negotiate stairs required to access the bathroom.  A 3in1 BSC will alleviate this problem  Question:  Patient needs a bedside commode to treat with the following condition  Answer:  Total knee replacement status   06/08/22 1650            Diagnostic Studies: DG Knee Left Port  Result Date: 06/08/2022 CLINICAL DATA:  Left total knee arthroplasty EXAM: PORTABLE LEFT KNEE - 1-2 VIEW COMPARISON:  None Available. FINDINGS: Frontal and cross-table lateral views of the left knee are obtained. Three component left knee arthroplasty is identified in the expected position without evidence of acute complication. Ghost holes are seen within the distal femur and proximal tibia. Postsurgical changes are seen in the soft tissues. Surgical drains are seen within the suprapatellar region. IMPRESSION: 1. Three component left knee arthroplasty as above. No evidence of acute complication. Electronically Signed   By: Randa Ngo  M.D.   On: 06/08/2022 15:20    Disposition: Plan for discharge home pending completion of PT goals.    Follow-up Information     Watt Climes, PA Follow up on 06/25/2022.   Specialty: Physician Assistant Why: at 8:45amam Contact information: [place] [address] [phone]         Dereck Leep, MD Follow up on 07/19/2022.   Specialty: Orthopedic Surgery Why: at 10:30am Contact information: [place] [address] [phone]                Signed: Judson Roch PA-C 06/09/2022, 9:51 AM

## 2022-06-09 NOTE — TOC Transition Note (Signed)
Transition of Care [facility]) - CM/SW Discharge Note   Patient Details  Name: Allen Tapia MRN: [id number] Date of Birth: 1955/04/03  [facility] CM/SW Contact:  Izola Price, RN Phone Number: 06/09/2022, 10:55 AM   Clinical Narrative:  3/6: Patient had a LEFT TKR on 06/08/22. POD#1 with discharge orders to home with home health PT. Was getting PT prior to admission. PT recommended HH PT and was set up with [facility] in the office prior to surgery. Patient has had prior knee surgery and has all needed DME. Spouse will transport home and to appointments, no issues obtaining medications. Simmie Davies RN CM    PCP: Derinda Late, MD [facility] [phone] - [place] [address], [place] [phone]  RX: [place], [place] [phone]     Final next level of care: Home w Home Health Services Barriers to Discharge: Barriers Resolved   Patient Goals and CMS Choice      Discharge Placement                         Discharge Plan and Services Additional resources added to the After Visit Summary for                  DME Arranged: N/A (Patient states he has all needed DME from a prior knee replacement.) DME Agency: NA       HH Arranged: PT [place] Agency: [place] (Set up via surgeon office prior to surgery. Confirmed with patient.)        Social Determinants of Health (SDOH) Interventions SDOH Screenings   Food Insecurity: No Food Insecurity (06/08/2022)  Housing: Low Risk  (06/08/2022)  Transportation Needs: No Transportation Needs (06/08/2022)  Utilities: Not At Risk (06/08/2022)  Tobacco Use: Medium Risk (06/08/2022)     Readmission Risk Interventions     No data to display

## 2022-06-11 ENCOUNTER — Encounter: Payer: Self-pay | Admitting: Orthopedic Surgery

## 2022-06-13 ENCOUNTER — Emergency Department
Admission: EM | Admit: 2022-06-13 | Discharge: 2022-06-13 | Disposition: A | Discharge status: Home / Self Care | Payer: Medicare Other | Attending: Emergency Medicine | Admitting: Emergency Medicine

## 2022-06-13 ENCOUNTER — Emergency Department: Payer: Medicare Other

## 2022-06-13 ENCOUNTER — Other Ambulatory Visit: Payer: Self-pay

## 2022-06-13 DIAGNOSIS — Z96652 Presence of left artificial knee joint: Secondary | ICD-10-CM | POA: Insufficient documentation

## 2022-06-13 DIAGNOSIS — R0602 Shortness of breath: Secondary | ICD-10-CM | POA: Insufficient documentation

## 2022-06-13 DIAGNOSIS — I959 Hypotension, unspecified: Secondary | ICD-10-CM | POA: Insufficient documentation

## 2022-06-13 DIAGNOSIS — R531 Weakness: Secondary | ICD-10-CM | POA: Diagnosis present

## 2022-06-13 DIAGNOSIS — I2510 Atherosclerotic heart disease of native coronary artery without angina pectoris: Secondary | ICD-10-CM | POA: Insufficient documentation

## 2022-06-13 LAB — TROPONIN I (HIGH SENSITIVITY)
Troponin I (High Sensitivity): 3 ng/L (ref <18)
Troponin I (High Sensitivity): 3 ng/L (ref <18)

## 2022-06-13 LAB — URINALYSIS, ROUTINE W REFLEX MICROSCOPIC
Bilirubin Urine: NEGATIVE
Glucose, UA: NEGATIVE mg/dL
Hgb urine dipstick: NEGATIVE
Ketones, ur: NEGATIVE mg/dL
Leukocytes,Ua: NEGATIVE
Nitrite: NEGATIVE
Protein, ur: NEGATIVE mg/dL
Specific Gravity, Urine: 1.019 (ref 1.005–1.030)
pH: 7 (ref 5.0–8.0)

## 2022-06-13 LAB — BASIC METABOLIC PANEL
Anion gap: 5 (ref 5–15)
BUN: 15 mg/dL (ref 8–23)
CO2: 26 mmol/L (ref 22–32)
Calcium: 8.5 mg/dL — ABNORMAL LOW (ref 8.9–10.3)
Chloride: 104 mmol/L (ref 98–111)
Creatinine, Ser: 0.81 mg/dL (ref 0.61–1.24)
GFR, Estimated: >60 mL/min (ref >60)
Glucose, Bld: 158 mg/dL — ABNORMAL HIGH (ref 70–99)
Potassium: 3.9 mmol/L (ref 3.5–5.1)
Sodium: 135 mmol/L (ref 135–145)

## 2022-06-13 LAB — CBC
HCT: 36.1 % — ABNORMAL LOW (ref 39.0–52.0)
Hemoglobin: 11.8 g/dL — ABNORMAL LOW (ref 13.0–17.0)
MCH: 31 pg (ref 26.0–34.0)
MCHC: 32.7 g/dL (ref 30.0–36.0)
MCV: 94.8 fL (ref 80.0–100.0)
Platelets: 243 10*3/uL (ref 150–400)
RBC: 3.81 MIL/uL — ABNORMAL LOW (ref 4.22–5.81)
RDW: 13.4 % (ref 11.5–15.5)
WBC: 8.4 10*3/uL (ref 4.0–10.5)
nRBC: 0 % (ref 0.0–0.2)

## 2022-06-13 LAB — D-DIMER, QUANTITATIVE: D-Dimer, Quant: 1.13 ug/mL-FEU — ABNORMAL HIGH (ref 0.00–0.50)

## 2022-06-13 LAB — BRAIN NATRIURETIC PEPTIDE: B Natriuretic Peptide: 19.4 pg/mL (ref 0.0–100.0)

## 2022-06-13 LAB — LACTIC ACID, PLASMA: Lactic Acid, Venous: 1.5 mmol/L (ref 0.5–1.9)

## 2022-06-13 MED ORDER — LACTATED RINGERS IV BOLUS
1000.0000 mL | Freq: Once | INTRAVENOUS | Status: AC
  Administered 2022-06-13: 1000 mL via INTRAVENOUS

## 2022-06-13 MED ORDER — IOHEXOL 350 MG/ML SOLN
75.0000 mL | Freq: Once | INTRAVENOUS | Status: AC | PRN
  Administered 2022-06-13: 75 mL via INTRAVENOUS

## 2022-06-13 NOTE — ED Triage Notes (Signed)
Pt to ED from [facility] next door. Pt went into there today for a tingling sensation in the ear. They assessed his BP and it was low and then he became weak. Pt is CAOx4 and in no acute distress. Pt is pale in triage.

## 2022-06-13 NOTE — ED Notes (Signed)
Pt via POV from [facility]. Pt was being seen at [facility] for tingling sensation in his R ear, states he was there to make sure he did not have an ear infection. States that he had increase in weakness and when they took his BP it was low. On arrival, per triage RN pt was diaphoretic and had a near syncopal episode. Pt denies any pain at this time and states he felt better. Pt is A&Ox4 and NAD at this time.

## 2022-06-13 NOTE — ED Notes (Addendum)
[place] called and informed this RN  that they are bringing over pt for CP and ear pain. Pt was hypotensive at 96/61. Pt also placed O2 with it at 94% and now at 96% per [facility]. Pt was going to be brought over on stretcher.   Pt arrived on stretcher and speaking with staff. Pt states he was having left ear pain and they went to [facility]. Pt was on exam table and passed out per staff for few second. Pt states he doesn't feel good. Wife with pt.

## 2022-06-13 NOTE — ED Provider Notes (Signed)
[facility] Provider Note    Event Date/Time   First MD Initiated Contact with Patient 06/13/22 1220     (approximate)   History   Loss of Consciousness, Weakness, and Hypotension   HPI  Allen Tapia is a 67 y.o. male past medical history significant for multiple sclerosis, recent left knee replacement, CAD,  hyperlipidemia, presents to the emergency department with low blood pressure.  Patient states that he was going to a walk-in clinic today because he was having some pain and tingling sensation in his left ear.  States that he has had intermittent episodes of tingling in his left ear in the past.  States that this has been ongoing for multiple weeks but was slightly worse today so he wanted it evaluated.  States that he has had a history of shingles in the past.  While in triage at the walk-in clinic had a sudden onset of not feeling well.  2 blood pressures that were low.  Sent over with IV fluids.  States that he is feeling better now after getting IV fluids.  Denies any nausea vomiting or diarrhea.  Denies any chest pain or shortness of breath.  Denies history of DVT or PE.  Denies any changes to his home medications.  Denies recent fever or chills.  States that he was in his normal state of health this morning.  Denies any dysuria, urinary urgency or frequency.  States that he always has difficulty with urination.  Denies any falls or trauma.  Denies any abdominal pain or back pain.  Denies any extremity numbness or weakness.  Physical Exam   Triage Vital Signs: ED Triage Vitals  Enc Vitals Group     BP 06/13/22 1154 (!) 56/43     Pulse Rate 06/13/22 1154 75     Resp 06/13/22 1154 16     Temp 06/13/22 1154 98 F (36.7 C)     Temp Source 06/13/22 1154 Oral     SpO2 06/13/22 1154 95 %     Weight 06/13/22 1155 206 lb (93.4 kg)     Height 06/13/22 1155 6' (1.829 m)     Head Circumference --      Peak Flow --      Pain Score 06/13/22 1152 4     Pain  Loc --      Pain Edu? --      Excl. in [place]? --     Most recent vital signs: Vitals:   06/13/22 1430 06/13/22 1500  BP: 127/79 127/76  Pulse: 69 65  Resp: 17 20  Temp:  98.7 F (37.1 C)  SpO2: 95% 94%    Physical Exam Constitutional:      Appearance: He is well-developed.  HENT:     Head: Atraumatic.  Eyes:     Conjunctiva/sclera: Conjunctivae normal.  Cardiovascular:     Rate and Rhythm: Regular rhythm.  Pulmonary:     Effort: No respiratory distress.  Abdominal:     General: There is no distension.     Tenderness: There is no abdominal tenderness.  Musculoskeletal:        General: Normal range of motion.     Cervical back: Normal range of motion.     Right lower leg: No edema.     Left lower leg: No edema.  Skin:    General: Skin is warm.  Neurological:     Mental Status: He is alert. Mental status is at baseline.  Psychiatric:  Mood and Affect: Mood normal.     IMPRESSION / MDM / ASSESSMENT AND PLAN / ED COURSE  I reviewed the triage vital signs and the nursing notes.  Differential diagnosis including ACS, sepsis, anemia, urinary tract infection, pulmonary embolism, pneumonia   EKG  I, Nathaniel Man, the attending physician, personally viewed and interpreted this ECG.   Rate: Normal  Rhythm: Normal sinus  Axis: Normal  Intervals: Normal  ST&T Change: None No change when compared to prior EKG  No tachycardic or bradycardic dysrhythmias while on cardiac telemetry.  RADIOLOGY I independently reviewed imaging, my interpretation of imaging: Chest x-ray without focal findings consistent with pneumonia.  CT scan with no signs of pulmonary embolism.  Read as no acute findings.  Renal cyst that does not require follow-up.  LABS (all labs ordered are listed, but only abnormal results are displayed) Labs interpreted as -    Labs Reviewed  BASIC METABOLIC PANEL - Abnormal; Notable for the following components:      Result Value   Glucose, Bld 158  (*)    Calcium 8.5 (*)    All other components within normal limits  CBC - Abnormal; Notable for the following components:   RBC 3.81 (*)    Hemoglobin 11.8 (*)    HCT 36.1 (*)    All other components within normal limits  URINALYSIS, ROUTINE W REFLEX MICROSCOPIC - Abnormal; Notable for the following components:   Color, Urine YELLOW (*)    APPearance CLEAR (*)    All other components within normal limits  D-DIMER, QUANTITATIVE - Abnormal; Notable for the following components:   D-Dimer, Quant 1.13 (*)    All other components within normal limits  CULTURE, BLOOD (ROUTINE X 2)  CULTURE, BLOOD (ROUTINE X 2)  LACTIC ACID, PLASMA  BRAIN NATRIURETIC PEPTIDE  CBG MONITORING, ED  TROPONIN I (HIGH SENSITIVITY)  TROPONIN I (HIGH SENSITIVITY)    TREATMENT  2 L of IV fluids  MDM  On arrival able to see patient's vital signs from prior to arrival with significantly low blood pressure.  On my arrival and evaluation of the patient he had received approximately 500 cc of IV fluids and was normotensive with a blood pressure of [id number] systolic.  Nonfocal neurologic exam.  Low suspicion for CVA.  Blood cultures and lactic acid obtained.  No sign for leukocytosis.  Creatinine at baseline.  No significant electrolyte abnormalities.  Troponin x 2 is negative.  Have a low suspicion for ACS or dysrhythmia.  CTA with no signs of pulmonary embolism.  Atelectasis and scarring which is consistent with prior COVID infection and history of COPD.  No signs of pneumonia.  UA without findings of urinary tract infection.  Orthostatic blood pressures are negative.  Patient states that he is feeling much better and back to his normal state of health.  Unclear what caused the episode of hypotension.  No other obvious source of an infectious process.  No abdominal or back pain have a low suspicion for AAA.  Discussed close follow-up with primary care physician and given return precautions for any worsening symptoms.      PROCEDURES:  Critical Care performed: yes  .Critical Care  Performed by: Nathaniel Man, MD Authorized by: Nathaniel Man, MD   Critical care provider statement:    Critical care time (minutes):  30   Critical care time was exclusive of:  Separately billable procedures and treating other patients   Critical care was necessary to treat or prevent imminent or  life-threatening deterioration of the following conditions: hypotension.   Critical care was time spent personally by me on the following activities:  Development of treatment plan with patient or surrogate, discussions with consultants, evaluation of patient's response to treatment, examination of patient, ordering and review of laboratory studies, ordering and review of radiographic studies, ordering and performing treatments and interventions, pulse oximetry, re-evaluation of patient's condition and review of old charts   Patient's presentation is most consistent with acute presentation with potential threat to life or bodily function.   MEDICATIONS ORDERED IN ED: Medications  lactated ringers bolus 1,000 mL (0 mLs Intravenous Stopped 06/13/22 1304)  lactated ringers bolus 1,000 mL (0 mLs Intravenous Stopped 06/13/22 1416)  iohexol (OMNIPAQUE) 350 MG/ML injection 75 mL (75 mLs Intravenous Contrast Given 06/13/22 1307)    FINAL CLINICAL IMPRESSION(S) / ED DIAGNOSES   Final diagnoses:  Hypotension, unspecified hypotension type     Rx / DC Orders   ED Discharge Orders     None        Note:  This document was prepared using Dragon voice recognition software and may include unintentional dictation errors.   Nathaniel Man, MD 06/13/22 [phone]

## 2022-06-13 NOTE — Discharge Instructions (Signed)
You were seen in the emergency department after you had an episode of very low blood pressure.  You received IV fluids and your blood pressure improved.  Your lab work was normal.  You had 2 heart enzymes that were normal, do not believe that you had a heart attack.  Follow-up closely with your primary care physician.  Return to the emergency department if you have any recurrence of your symptoms.

## 2022-06-13 NOTE — ED Notes (Signed)
Dr. Jori Moll, EDP at bedside for reevaluation at this time.

## 2022-06-15 DIAGNOSIS — G35 Multiple sclerosis: Secondary | ICD-10-CM

## 2022-06-15 DIAGNOSIS — M1712 Unilateral primary osteoarthritis, left knee: Secondary | ICD-10-CM

## 2022-06-15 DIAGNOSIS — R7303 Prediabetes: Secondary | ICD-10-CM

## 2022-06-15 DIAGNOSIS — R0600 Dyspnea, unspecified: Secondary | ICD-10-CM

## 2022-06-15 DIAGNOSIS — I2510 Atherosclerotic heart disease of native coronary artery without angina pectoris: Secondary | ICD-10-CM

## 2022-06-16 LAB — CULTURE, BLOOD (ROUTINE X 2)

## 2022-06-18 LAB — CULTURE, BLOOD (ROUTINE X 2)
Culture: NO GROWTH
Special Requests: ADEQUATE

## 2022-06-20 LAB — CULTURE, BLOOD (ROUTINE X 2): Culture: NO GROWTH

## 2022-08-06 ENCOUNTER — Ambulatory Visit
Admission: RE | Admit: 2022-08-06 | Discharge: 2022-08-06 | Disposition: A | Discharge status: Home / Self Care | Payer: Medicare Other | Source: Ambulatory Visit | Attending: Emergency Medicine | Admitting: Emergency Medicine

## 2022-08-06 ENCOUNTER — Ambulatory Visit (INDEPENDENT_AMBULATORY_CARE_PROVIDER_SITE_OTHER): Payer: Medicare Other

## 2022-08-06 VITALS — BP 128/84 | HR 105 | Temp 98.0°F | Resp 19

## 2022-08-06 DIAGNOSIS — R0902 Hypoxemia: Secondary | ICD-10-CM

## 2022-08-06 DIAGNOSIS — J441 Chronic obstructive pulmonary disease with (acute) exacerbation: Secondary | ICD-10-CM

## 2022-08-06 DIAGNOSIS — R051 Acute cough: Secondary | ICD-10-CM | POA: Diagnosis not present

## 2022-08-06 DIAGNOSIS — R0602 Shortness of breath: Secondary | ICD-10-CM | POA: Diagnosis not present

## 2022-08-06 MED ORDER — PREDNISONE 10 MG PO TABS: 40.0000 mg | ORAL_TABLET | Freq: Every day | ORAL | 0 refills | Status: AC

## 2022-08-06 MED ORDER — DOXYCYCLINE HYCLATE 100 MG PO CAPS: 100.0000 mg | ORAL_CAPSULE | Freq: Two times a day (BID) | ORAL | 0 refills | Status: AC

## 2022-08-06 MED ORDER — ALBUTEROL SULFATE (2.5 MG/3ML) 0.083% IN NEBU
2.5000 mg | INHALATION_SOLUTION | RESPIRATORY_TRACT | Status: AC
  Administered 2022-08-06: 2.5 mg via RESPIRATORY_TRACT

## 2022-08-06 NOTE — ED Triage Notes (Signed)
Patient to Urgent Care with complaints of SHOB/ wheezing.    Reports over the last 6 days he has developed a productive cough with thick/ brown mucus. Reports he started a prednisone taper on Thursday (60mg - decreased by 10mg  daily). Also taking mucinex. Unknown fevers- states he did have chills one night but temp was 98.2.  Patient w/ hx of COPD. RA sats during triage 88%. Placed on 3L via [place]. States he has not used his inhalers today.

## 2022-08-06 NOTE — ED Provider Notes (Signed)
Allen Tapia    CSN: [id number] Arrival date & time: 08/06/22  4098      History   Chief Complaint Chief Complaint  Patient presents with   Wheezing    Bad cough x 5 days, very productive cough, large amounts of phlegm and  it is thick, brownish green in color - Entered by patient    HPI Allen Tapia is a 67 y.o. male.  Patient presents with cough, wheezing, shortness of breath is 1 week.  His symptoms started after he returned from a trip to [place].  His cough is productive of thick green-brown phlegm.  He denies fever, chest pain, focal weakness, or other symptoms.  He has been treating his symptoms with prednisone that was left over from a previous illness.  His medical history includes COPD, multiple sclerosis, abdominal aortic aneurysm, Barrett's esophagus, ischemic heart disease.  The history is provided by the patient and medical records.    Past Medical History:  Diagnosis Date   Anginal pain (HCC)    atypical chest pain cardiac cath performed at that time   Arthritis    Asthma    Atypical chest pain    Barrett's esophagus    Bilateral cataracts    BPH (benign prostatic hyperplasia)    Chicken pox    Chronic constipation    Coronary artery disease 07/14/2007   a.) LHC 07/14/2007: 40% pRCA-1, 40% pRCA-2, 40% dRCA, 20% RPL1, 30% LM, 20% mLCx-1, 40% mLCx-2, 20% OM3-1, 20% OM3-2, 20% pLAD, 40% mLAD-1, 70% mLAD-2, 40% mLAD-3, 30% dLAD, 40% D1, 40% D2 --> med mgmt.   Depression    Foreign body (FB) in soft tissue    Former tobacco use    GERD (gastroesophageal reflux disease)    History of 2019 novel coronavirus disease (COVID-19) 04/07/2019   a.) tested (+) at [facility] 04/16/2019, 05/30/2021; b.) 2 other (+) tests at home (dates unknown)   History of kidney stones    Hyperlipidemia    Hyperplastic colon polyp    Infrarenal abdominal aortic aneurysm (AAA) without rupture (HCC)    a.) MR lumbar spine 12/30/2020: infrarenal saccular AAA measuring  2.2 cm; b.) CTA A/P 01/05/2021: measured 2.6 cm; c.) s/p EVAR 03/03/2021   Ischemic heart disease 12/20/2010   Long term current use of immunosuppressive drug    a.) on ofatumumab for MS   MS (multiple sclerosis) (HCC)    a.) on ofatumumab   Shingles    Tubular adenoma of colon 08/23/2016    Patient Active Problem List   Diagnosis Date Noted   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/08/2022   Left anterior cruciate ligament tear 04/13/2022   Primary osteoarthritis of left knee 04/13/2022   Prediabetes 09/19/2021   AAA (abdominal aortic aneurysm) (HCC) 03/03/2021   AAA (abdominal aortic aneurysm) without rupture (HCC) 01/06/2021   Status post total right knee replacement 10/05/2020   Mild intermittent asthma 12/07/2019   Pure hypercholesterolemia 05/27/2019   Status post laser cataract surgery, left 02/11/2019   Barrett's esophagus 10/10/2016   Chronic constipation 10/10/2016   Ankylosis of right elbow joint 04/11/2015   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   Encounter for long-term (current) use of other medications 06/08/2013   Dyspnea, unspecified 04/07/2012   Multiple sclerosis (HCC) 01/31/2012   Acid reflux 12/20/2010   CAD (coronary artery disease) 12/20/2010   Depression 12/20/2010   Family history of heart disease 12/20/2010   Foreign body 12/20/2010  Hyperlipemia 12/20/2010   Tobacco abuse 12/20/2010    Past Surgical History:  Procedure Laterality Date   ABDOMINAL AORTIC ENDOVASCULAR STENT GRAFT N/A 03/03/2021   Procedure: ABDOMINAL AORTIC ENDOVASCULAR STENT GRAFT;  Surgeon: Maeola Harman, MD;  Location: [facility] OR;  Service: Vascular;  Laterality: N/A;   ADENOIDECTOMY  1960   APPENDECTOMY  1979   CATARACT EXTRACTION, BILATERAL  03/12/2019   COLONOSCOPY  02/05/2008   COLONOSCOPY WITH PROPOFOL N/A 08/23/2016   Procedure: COLONOSCOPY WITH PROPOFOL;  Surgeon: Christena Deem, MD;  Location: [facility] ENDOSCOPY;   Service: Endoscopy;  Laterality: N/A;   COLONOSCOPY WITH PROPOFOL N/A 04/29/2020   Procedure: COLONOSCOPY WITH PROPOFOL;  Surgeon: Regis Bill, MD;  Location: [facility] ENDOSCOPY;  Service: Endoscopy;  Laterality: N/A;   CYSTOSCOPY WITH INSERTION OF UROLIFT N/A 05/20/2020   Procedure: CYSTOSCOPY WITH INSERTION OF UROLIFT;  Surgeon: Sondra Come, MD;  Location: [facility] ORS;  Service: Urology;  Laterality: N/A;   ESOPHAGOGASTRODUODENOSCOPY (EGD) WITH PROPOFOL N/A 08/23/2016   Procedure: ESOPHAGOGASTRODUODENOSCOPY (EGD) WITH PROPOFOL;  Surgeon: Christena Deem, MD;  Location: [facility] ENDOSCOPY;  Service: Endoscopy;  Laterality: N/A;   ESOPHAGOGASTRODUODENOSCOPY (EGD) WITH PROPOFOL N/A 04/29/2020   Procedure: ESOPHAGOGASTRODUODENOSCOPY (EGD) WITH PROPOFOL;  Surgeon: Regis Bill, MD;  Location: [facility] ENDOSCOPY;  Service: Endoscopy;  Laterality: N/A;   ESOPHAGOGASTRODUODENOSCOPY ENDOSCOPY  02/05/2008   EYE SURGERY     eye trauma  1970   piece of metal to eye removed in clinic   JOINT REPLACEMENT     KNEE ARTHROPLASTY Right 10/05/2020   Procedure: COMPUTER ASSISTED TOTAL KNEE ARTHROPLASTY;  Surgeon: Donato Heinz, MD;  Location: [facility] ORS;  Service: Orthopedics;  Laterality: Right;   KNEE ARTHROPLASTY Left 06/08/2022   Procedure: COMPUTER ASSISTED TOTAL KNEE ARTHROPLASTY;  Surgeon: Donato Heinz, MD;  Location: [facility] ORS;  Service: Orthopedics;  Laterality: Left;   KNEE ARTHROSCOPY Left 12/11/2021   Procedure: ARTHROSCOPY KNEE;  Surgeon: Donato Heinz, MD;  Location: [facility] ORS;  Service: Orthopedics;  Laterality: Left;   KNEE ARTHROSCOPY W/ PARTIAL MEDIAL MENISCECTOMY Right 01/02/2011   LEFT HEART CATH AND CORONARY ANGIOGRAPHY Left 07/14/2007   Procedure: LEFT HEART CATH AND CORONARY ANGIOGRAPHY; Location: [facility]; Surgeon: Eugenia Pancoast, MD   SEPTOPLASTY     TONSILLECTOMY  02/05/2008   ULTRASOUND GUIDANCE FOR VASCULAR ACCESS Left 03/03/2021   Procedure: ULTRASOUND GUIDANCE FOR VASCULAR  ACCESS, LEFT FEMORAL ARTERY;  Surgeon: Maeola Harman, MD;  Location: [facility] OR;  Service: Vascular;  Laterality: Left;   WRIST FRACTURE SURGERY Right        Home Medications    Prior to Admission medications   Medication Sig Start Date End Date Taking? Authorizing Provider  doxycycline (VIBRAMYCIN) 100 MG capsule Take 1 capsule (100 mg total) by mouth 2 (two) times daily for 7 days. 08/06/22 08/13/22 Yes Mickie Bail, NP  predniSONE (DELTASONE) 10 MG tablet Take 4 tablets (40 mg total) by mouth daily for 5 days. 08/06/22 08/11/22 Yes Mickie Bail, NP  acetaminophen (TYLENOL) 500 MG tablet Take 2 tablets (1,000 mg total) by mouth every 6 (six) hours as needed for mild pain. Patient not taking: Reported on 08/06/2022 06/09/22   Anson Oregon, PA-C  albuterol (VENTOLIN HFA) 108 [phone] Base) MCG/ACT inhaler Inhale 2 puffs into the lungs every 6 (six) hours as needed for wheezing or shortness of breath. 09/12/21   Glenford Bayley, NP  amphetamine-dextroamphetamine (ADDERALL XR) 25 MG 24 hr capsule Take 25 mg by mouth daily  as needed (focus).    [provider]  baclofen (LIORESAL) 20 MG tablet Take 20 mg by mouth 3 (three) times daily.    [provider]  budesonide-formoterol (SYMBICORT) 160-4.5 MCG/ACT inhaler Inhale 1-2 puffs into the lungs in the morning and at bedtime. 02/19/22   Glenford Bayley, NP  buPROPion (WELLBUTRIN XL) 150 MG 24 hr tablet Take 150 mg by mouth every morning. 12/16/18   [provider]  celecoxib (CELEBREX) 200 MG capsule Take 200 mg by mouth 2 (two) times daily. 02/26/21   [provider]  enoxaparin (LOVENOX) 40 MG/0.4ML injection Inject 0.4 mLs (40 mg total) into the skin daily. Patient not taking: Reported on 08/06/2022 06/09/22   Anson Oregon, PA-C  gabapentin (NEURONTIN) 300 MG capsule Take 1-2 capsules (300-600 mg total) by mouth 2 (two) times daily. 600 mg in am and 300 mg in afternoon 06/09/22   Anson Oregon, PA-C  mirabegron ER (MYRBETRIQ) 50 MG TB24 tablet Take 50 mg by mouth every morning.    [provider]  montelukast (SINGULAIR) 10 MG tablet Take 1 tablet (10 mg total) by mouth at bedtime. 02/07/22   Glenford Bayley, NP  Multiple Vitamins-Minerals (MULTIVITAMIN WITH MINERALS) tablet Take 1 tablet by mouth daily.    [provider]  Ofatumumab (KESIMPTA) 20 MG/0.4ML SOAJ Inject 1 Dose into the skin every 30 (thirty) days. 1st day of every month    [provider]  ondansetron (ZOFRAN) 4 MG tablet Take 1 tablet (4 mg total) by mouth every 6 (six) hours as needed for nausea. 06/09/22   Anson Oregon, PA-C  oxyCODONE (OXY IR/ROXICODONE) 5 MG immediate release tablet Take 1 tablet (5 mg total) by mouth every 4 (four) hours as needed for severe pain. Patient not taking: Reported on 08/06/2022 06/09/22   Anson Oregon, PA-C  pantoprazole (PROTONIX) 20 MG tablet Take 1 tablet by mouth at bedtime. 09/11/21   [provider]  Polyethyl Glycol-Propyl Glycol (SYSTANE) 0.4-0.3 % SOLN Apply 1 drop to eye 2 (two) times daily.    [provider]  rosuvastatin (CRESTOR) 20 MG tablet TAKE 1 TABLET AT BEDTIME 01/29/22   Jodelle Red, MD  Spacer/Aero-Holding Chambers (AEROCHAMBER MV) inhaler Use as instructed 06/11/18   Icard, Rachel Bo, DO  tamsulosin (FLOMAX) 0.4 MG CAPS capsule Take 1 capsule (0.4 mg total) by mouth daily. Patient taking differently: Take 0.4 mg by mouth daily after supper. 04/04/22   Sondra Come, MD  traMADol (ULTRAM) 50 MG tablet Take 1-2 tablets (50-100 mg total) by mouth every 4 (four) hours as needed for moderate pain. Patient not taking: Reported on 08/06/2022 06/09/22   Anson Oregon, PA-C  traZODone (DESYREL) 100 MG tablet Take 100 mg by mouth at bedtime.    [provider]  Vibegron (GEMTESA) 75 MG TABS Take 75 mg by mouth daily. 04/04/22   Sondra Come, MD  VITAMIN D PO Take 1 capsule by mouth  daily.    [provider]    Family History Family History  Problem Relation Age of Onset   Heart disease Mother    Glaucoma Mother    Irregular heart beat Mother    Cardiomyopathy Mother    Colon polyps Mother    Cataracts Mother    Hypertension Father    Heart disease Father    Cancer Father    Esophageal cancer Cousin     Social History Social History   Tobacco Use  Smoking status: Former    Packs/day: 1.00    Years: 20.00    Additional pack years: 0.00    Total pack years: 20.00    Types: Cigarettes    Start date: 73    Quit date: 09/23/2006    Years since quitting: 15.8   Smokeless tobacco: Never  Vaping Use   Vaping Use: Never used  Substance Use Topics   Alcohol use: No   Drug use: No     Allergies   Patient has no known allergies.   Review of Systems Review of Systems  Constitutional:  Negative for chills and fever.  HENT:  Negative for ear pain and sore throat.   Respiratory:  Positive for cough, shortness of breath and wheezing.   Cardiovascular:  Negative for chest pain and palpitations.     Physical Exam Triage Vital Signs ED Triage Vitals [08/06/22 0852]  Enc Vitals Group     BP      Pulse Rate (!) 101     Resp 20     Temp 98 F (36.7 C)     Temp src      SpO2 (!) 88 %     Weight      Height      Head Circumference      Peak Flow      Pain Score      Pain Loc      Pain Edu?      Excl. in [place]?    No data found.  Updated Vital Signs BP 128/84   Pulse (!) 105   Temp 98 F (36.7 C)   Resp 19   SpO2 91%   Visual Acuity Right Eye Distance:   Left Eye Distance:   Bilateral Distance:    Right Eye Near:   Left Eye Near:    Bilateral Near:     Physical Exam Vitals and nursing note reviewed.  Constitutional:      General: He is not in acute distress.    Appearance: Normal appearance. He is well-developed. He is not ill-appearing.  HENT:     Right Ear: Tympanic membrane normal.     Left Ear: Tympanic  membrane normal.     Nose: Nose normal.     Mouth/Throat:     Mouth: Mucous membranes are moist.     Pharynx: Oropharynx is clear.  Cardiovascular:     Rate and Rhythm: Normal rate and regular rhythm.     Heart sounds: Normal heart sounds.  Pulmonary:     Effort: Pulmonary effort is normal. No respiratory distress.     Breath sounds: Wheezing and rhonchi present.  Musculoskeletal:     Cervical back: Neck supple.  Skin:    General: Skin is warm and dry.  Neurological:     Mental Status: He is alert.  Psychiatric:        Mood and Affect: Mood normal.        Behavior: Behavior normal.      UC Treatments / Results  Labs (all labs ordered are listed, but only abnormal results are displayed) Labs Reviewed - No data to display  EKG   Radiology DG Chest 2 View  Result Date: 08/06/2022 CLINICAL DATA:  Hypoxia EXAM: CHEST - 2 VIEW COMPARISON:  06/13/2022 FINDINGS: The heart size and mediastinal contours are within normal limits. Aortic atherosclerosis. Tortuosity of the descending thoracic aorta. Both lungs are clear. The visualized skeletal structures are unremarkable. Unchanged metallic density overlying the mid left  chest wall anteriorly. IMPRESSION: No active cardiopulmonary disease. Electronically Signed   By: Duanne Guess D.O.   On: 08/06/2022 09:42    Procedures Procedures (including critical care time)  Medications Ordered in UC Medications  albuterol (PROVENTIL) (2.5 MG/3ML) 0.083% nebulizer solution 2.5 mg (2.5 mg Nebulization Given 08/06/22 0908)    Initial Impression / Assessment and Plan / UC Course  I have reviewed the triage vital signs and the nursing notes.  Pertinent labs & imaging results that were available during my care of the patient were reviewed by me and considered in my medical decision making (see chart for details).   Hypoxia, shortness of breath, cough, COPD exacerbation.   Patient's O2 sat was 88% on room air upon arrival; increased to 91-93%  on 3 L O2 via nasal cannula.  Patient declines transfer to the ED despite his low oxygen level.  I discussed the risks, including respiratory failure and death, but he still declines transfer to the ED.  Albuterol nebulizer treatment given here.  CXR negative.  O2 sat staying at 91-92% on room air.  Treating with prednisone and doxycycline; continued use of inhalers.  Instructed patient to follow-up with his PCP tomorrow.  Strict ED precautions discussed.  Education provided on COPD exacerbation and shortness of breath.  Patient agrees to plan of care.    Final Clinical Impressions(s) / UC Diagnoses   Final diagnoses:  Hypoxia  Shortness of breath  Acute cough  COPD exacerbation (HCC)     Discharge Instructions      Take the prednisone and doxycycline as directed.  Continue to use your inhalers as directed.  Follow up with your primary care provider tomorrow.  Go to the emergency department if you have worsening symptoms.          ED Prescriptions     Medication Sig Dispense Auth. Provider   predniSONE (DELTASONE) 10 MG tablet Take 4 tablets (40 mg total) by mouth daily for 5 days. 20 tablet Mickie Bail, NP   doxycycline (VIBRAMYCIN) 100 MG capsule Take 1 capsule (100 mg total) by mouth 2 (two) times daily for 7 days. 14 capsule Mickie Bail, NP      PDMP not reviewed this encounter.   Mickie Bail, NP 08/06/22 [phone]

## 2022-08-06 NOTE — Discharge Instructions (Signed)
Take the prednisone and doxycycline as directed.  Continue to use your inhalers as directed.  Follow up with your primary care provider tomorrow.  Go to the emergency department if you have worsening symptoms.

## 2022-08-10 MED ORDER — PROMETHAZINE-DM 6.25-15 MG/5ML PO SYRP: 5.0000 mL | ORAL_SOLUTION | Freq: Four times a day (QID) | ORAL | 0 refills | Status: DC | PRN

## 2022-08-10 NOTE — Telephone Encounter (Signed)
Mychart message feed posted below:  Allen Tapia  P [facility] (supporting Allen Bayley, NP)4 hours ago (9:59 AM)    i.e., to be able to lie still and not cough during the scan.  Thanks    Allen Tapia  P [facility] (supporting Allen Bayley, NP)5 hours ago (8:59 AM)    Also, I have a brain MRI scheduled at [facility] on Monday morning that I've been waiting for a long time.  If possible, I really need to be able to lie still for that procedure.  Thank you for anything you can do to help me.    Allen Tapia [facility] (supporting Allen Bayley, NP)17 hours ago (8:51 PM)    OK, we did a covid test on me.  I was negative.   I take my LAST Prednisone 40mg  this Friday.  I finish the last Doxycycline on Sunday evening. We tested our 68 y/o son who lives with [place].  He was negative as well.  He became symptomatic 5/13 evening and by the afternoon of the 14th with a temp of 102.3   I hope this is what you need.    If you would like to call me, my # [phone] Thank you, Allen Tapia    You  Allen Frederic Vaughn22 hours ago (3:56 PM)   Allen Tapia,   We definitely need you to be seen for a visit so we can figure out what is going on but we really need you to do a covid test before you do come in for an appointment. Please take a home covid test as soon as you can and once you do a covid test, let us know what the results are and we can go from there.    Allen Tapia [facility] (supporting Allen Bayley, NP)2 days ago    I forgot this part, the NP asked me to purchase an oximeter.  My pulse ox this morning was 91% about 10 minutes after waking up.  After using inhalers, up to 94%.  Thirty minutes later, my resting pulse ox is 92%   Thank you    Allen Tapia  P [facility] (supporting Allen Bayley, NP)2 days ago    Hello, I returned from a short visit in [place] on May  5th, on May 7th, I started developing a cough,  9th,(I am already on Symbicort 1-2 puffs BID  & Albuterol as needed and I was using the Albuterol about 3 times a day at this point, I did seem to have a chill on one of these evenings, but the next morning, my temp was 98.2) cough worsening, productive thick greenish secretions and I started Mucinex, the 10th, I started a 6 day, 60mg  prednisone taper that I had on hand, but did not have an antibiotic.  On  Monday, 13th, I was not making progress, so I went to a local [facility] clinic.  I didn't feel well, but I was able to move about reasonable.  Upon checking in, my pulse ox was 85%.  I was started on [facility] and initially was motivated to be sent to the local ED.  (I had a recent trip to the ED for hypotension from an unrelated post surgical knee replacement and I just didn't want to return to the ED.) I was given an Albuterol inhalation treatment and a CXR  which showed no pneumonia. I did not get a Covid test.  I was discharged home with I think, a pulse ox of 91%, was asked to stop the previous Prednisone and begin a 5 day course of Prednisone 40mg  and started Doxycycline 100mg  BID x 7 days and I'm still using the Mucinex. My sx are improved "some".  Yesterday, our 77 y/0 son came home sick from work, temp of 102.3.  Should I come and see you and get you to give me a Covid test?   I do not know what I should do now.   Thank you!  Allen Tapia, please advise on all this for pt what you recommend.

## 2022-08-10 NOTE — Telephone Encounter (Signed)
Sent Mychart message to patient.

## 2022-08-13 ENCOUNTER — Emergency Department (HOSPITAL_COMMUNITY): Payer: Medicare Other

## 2022-08-13 ENCOUNTER — Emergency Department (HOSPITAL_COMMUNITY)
Admission: EM | Admit: 2022-08-13 | Discharge: 2022-08-14 | Disposition: A | Discharge status: Home / Self Care | Payer: Medicare Other | Attending: Emergency Medicine | Admitting: Emergency Medicine

## 2022-08-13 ENCOUNTER — Telehealth: Payer: Medicare Other | Admitting: Family Medicine

## 2022-08-13 ENCOUNTER — Encounter (HOSPITAL_COMMUNITY): Payer: Self-pay | Admitting: Emergency Medicine

## 2022-08-13 DIAGNOSIS — Z96652 Presence of left artificial knee joint: Secondary | ICD-10-CM | POA: Insufficient documentation

## 2022-08-13 DIAGNOSIS — I2510 Atherosclerotic heart disease of native coronary artery without angina pectoris: Secondary | ICD-10-CM | POA: Insufficient documentation

## 2022-08-13 DIAGNOSIS — J45909 Unspecified asthma, uncomplicated: Secondary | ICD-10-CM | POA: Diagnosis not present

## 2022-08-13 DIAGNOSIS — R052 Subacute cough: Secondary | ICD-10-CM

## 2022-08-13 DIAGNOSIS — Z87891 Personal history of nicotine dependence: Secondary | ICD-10-CM | POA: Insufficient documentation

## 2022-08-13 DIAGNOSIS — J168 Pneumonia due to other specified infectious organisms: Secondary | ICD-10-CM | POA: Diagnosis not present

## 2022-08-13 DIAGNOSIS — J189 Pneumonia, unspecified organism: Secondary | ICD-10-CM

## 2022-08-13 DIAGNOSIS — Z955 Presence of coronary angioplasty implant and graft: Secondary | ICD-10-CM | POA: Diagnosis not present

## 2022-08-13 DIAGNOSIS — Z8616 Personal history of COVID-19: Secondary | ICD-10-CM | POA: Insufficient documentation

## 2022-08-13 DIAGNOSIS — R7981 Abnormal blood-gas level: Secondary | ICD-10-CM

## 2022-08-13 DIAGNOSIS — R0602 Shortness of breath: Secondary | ICD-10-CM | POA: Diagnosis present

## 2022-08-13 LAB — CBC WITH DIFFERENTIAL/PLATELET
Abs Immature Granulocytes: 0.23 10*3/uL — ABNORMAL HIGH (ref 0.00–0.07)
Basophils Absolute: 0.1 10*3/uL (ref 0.0–0.1)
Basophils Relative: 0 %
Eosinophils Absolute: 0.1 10*3/uL (ref 0.0–0.5)
Eosinophils Relative: 1 %
HCT: 43.3 % (ref 39.0–52.0)
Hemoglobin: 13.9 g/dL (ref 13.0–17.0)
Immature Granulocytes: 2 %
Lymphocytes Relative: 5 %
Lymphs Abs: 0.7 10*3/uL (ref 0.7–4.0)
MCH: 30.2 pg (ref 26.0–34.0)
MCHC: 32.1 g/dL (ref 30.0–36.0)
MCV: 93.9 fL (ref 80.0–100.0)
Monocytes Absolute: 1.3 10*3/uL — ABNORMAL HIGH (ref 0.1–1.0)
Monocytes Relative: 9 %
Neutro Abs: 12.1 10*3/uL — ABNORMAL HIGH (ref 1.7–7.7)
Neutrophils Relative %: 83 %
Platelets: 352 10*3/uL (ref 150–400)
RBC: 4.61 MIL/uL (ref 4.22–5.81)
RDW: 13.4 % (ref 11.5–15.5)
WBC: 14.5 10*3/uL — ABNORMAL HIGH (ref 4.0–10.5)
nRBC: 0 % (ref 0.0–0.2)

## 2022-08-13 NOTE — Progress Notes (Signed)
Shortness of breath, eval for heart failure

## 2022-08-13 NOTE — Progress Notes (Signed)
Sharkey   "Recent respiratory illness, you may want to look at messages I have already exchanged with Ames Dura this past week.  I am finishing Prednisone and Doxycycline today which was prescribed early last at a walk-in clinic.  The cough has improved some, but is still producing significant amounts of sputum at frequent intervals, went from thick green to a thick creme color now, foul taste remains.  Home pulse ox usually 90-92, occasionally higher at 93-94.  Still SOB at intervals"  Advised to go to Urgent Care for in person. R/O CAP or Clot given sat levels.   Patient acknowledged agreement and understanding of the plan.

## 2022-08-13 NOTE — ED Triage Notes (Addendum)
Presents from home for SOB x 2 weeks. Returned on 5/5 from [place] and developed a URI for which he was treated with Doxycycline and prednisone. Difficulty lying flat from cough and SOB. Noted SpO2 90-92% at home on RA. Went to [facility] who found increased WBCs and directed patient to ER. Blood work and CXR done today.   H/o MS, had MRI this morning

## 2022-08-14 DIAGNOSIS — J168 Pneumonia due to other specified infectious organisms: Secondary | ICD-10-CM | POA: Diagnosis not present

## 2022-08-14 LAB — BASIC METABOLIC PANEL
Anion gap: 11 (ref 5–15)
BUN: 15 mg/dL (ref 8–23)
CO2: 26 mmol/L (ref 22–32)
Calcium: 9.1 mg/dL (ref 8.9–10.3)
Chloride: 104 mmol/L (ref 98–111)
Creatinine, Ser: 0.73 mg/dL (ref 0.61–1.24)
GFR, Estimated: >60 mL/min (ref >60)
Glucose, Bld: 95 mg/dL (ref 70–99)
Potassium: 3.9 mmol/L (ref 3.5–5.1)
Sodium: 141 mmol/L (ref 135–145)

## 2022-08-14 MED ORDER — CEFDINIR 300 MG PO CAPS: 300.0000 mg | ORAL_CAPSULE | Freq: Two times a day (BID) | ORAL | 0 refills | Status: AC

## 2022-08-14 MED ORDER — AZITHROMYCIN 250 MG PO TABS
500.0000 mg | ORAL_TABLET | Freq: Once | ORAL | Status: AC
  Administered 2022-08-14: 500 mg via ORAL
  Filled 2022-08-14: qty 2

## 2022-08-14 MED ORDER — AZITHROMYCIN 250 MG PO TABS: 250.0000 mg | ORAL_TABLET | Freq: Every day | ORAL | 0 refills | Status: AC

## 2022-08-14 MED ORDER — CEFDINIR 300 MG PO CAPS
300.0000 mg | ORAL_CAPSULE | Freq: Once | ORAL | Status: AC
  Administered 2022-08-14: 300 mg via ORAL
  Filled 2022-08-14: qty 1

## 2022-08-14 NOTE — ED Notes (Signed)
Pt hypoxic in triage -88% on RA. Placed on 2LNC, sats now >93%

## 2022-08-14 NOTE — ED Notes (Signed)
Pt walked around the hall and back. Pt's initial HR 80s SpO2 98% on room air. After ambulation pts HR increased 114-118. No hypoxia. Spo2 93% on room air. Pt states "very little SOB." EDP Bero informed

## 2022-08-14 NOTE — ED Provider Notes (Signed)
[facility] Emergency Department Provider Note MRN:  [id number]  Arrival date & time: 08/14/22     Chief Complaint   No chief complaint on file.   History of Present Illness   Allen Tapia is a 67 y.o. year-old male with a history of MS presenting to the ED with chief complaint of shortness of breath.  Shortness of breath for 2 weeks since returning from under arrest.  Treated with doxycycline and prednisone, completed a 10-day course of the doxycycline.  Getting worse not better.  Sent here from urgent care.  Exhibited some hypoxia in the waiting room and at home.  Review of Systems  A thorough review of systems was obtained and all systems are negative except as noted in the HPI and PMH.   Patient's Health History    Past Medical History:  Diagnosis Date   Anginal pain (HCC)    atypical chest pain cardiac cath performed at that time   Arthritis    Asthma    Atypical chest pain    Barrett's esophagus    Bilateral cataracts    BPH (benign prostatic hyperplasia)    Chicken pox    Chronic constipation    Coronary artery disease 07/14/2007   a.) LHC 07/14/2007: 40% pRCA-1, 40% pRCA-2, 40% dRCA, 20% RPL1, 30% LM, 20% mLCx-1, 40% mLCx-2, 20% OM3-1, 20% OM3-2, 20% pLAD, 40% mLAD-1, 70% mLAD-2, 40% mLAD-3, 30% dLAD, 40% D1, 40% D2 --> med mgmt.   Depression    Foreign body (FB) in soft tissue    Former tobacco use    GERD (gastroesophageal reflux disease)    History of 2019 novel coronavirus disease (COVID-19) 04/07/2019   a.) tested (+) at [facility] 04/16/2019, 05/30/2021; b.) 2 other (+) tests at home (dates unknown)   History of kidney stones    Hyperlipidemia    Hyperplastic colon polyp    Infrarenal abdominal aortic aneurysm (AAA) without rupture (HCC)    a.) MR lumbar spine 12/30/2020: infrarenal saccular AAA measuring 2.2 cm; b.) CTA A/P 01/05/2021: measured 2.6 cm; c.) s/p EVAR 03/03/2021   Ischemic heart disease 12/20/2010   Long term  current use of immunosuppressive drug    a.) on ofatumumab for MS   MS (multiple sclerosis) (HCC)    a.) on ofatumumab   Shingles    Tubular adenoma of colon 08/23/2016    Past Surgical History:  Procedure Laterality Date   ABDOMINAL AORTIC ENDOVASCULAR STENT GRAFT N/A 03/03/2021   Procedure: ABDOMINAL AORTIC ENDOVASCULAR STENT GRAFT;  Surgeon: Maeola Harman, MD;  Location: [facility] OR;  Service: Vascular;  Laterality: N/A;   ADENOIDECTOMY  1960   APPENDECTOMY  1979   CATARACT EXTRACTION, BILATERAL  03/12/2019   COLONOSCOPY  02/05/2008   COLONOSCOPY WITH PROPOFOL N/A 08/23/2016   Procedure: COLONOSCOPY WITH PROPOFOL;  Surgeon: Christena Deem, MD;  Location: [facility] ENDOSCOPY;  Service: Endoscopy;  Laterality: N/A;   COLONOSCOPY WITH PROPOFOL N/A 04/29/2020   Procedure: COLONOSCOPY WITH PROPOFOL;  Surgeon: Regis Bill, MD;  Location: [facility] ENDOSCOPY;  Service: Endoscopy;  Laterality: N/A;   CYSTOSCOPY WITH INSERTION OF UROLIFT N/A 05/20/2020   Procedure: CYSTOSCOPY WITH INSERTION OF UROLIFT;  Surgeon: Sondra Come, MD;  Location: [facility] ORS;  Service: Urology;  Laterality: N/A;   ESOPHAGOGASTRODUODENOSCOPY (EGD) WITH PROPOFOL N/A 08/23/2016   Procedure: ESOPHAGOGASTRODUODENOSCOPY (EGD) WITH PROPOFOL;  Surgeon: Christena Deem, MD;  Location[facility] ENDOSCOPY;  Service: Endoscopy;  Laterality: N/A;   ESOPHAGOGASTRODUODENOSCOPY (EGD) WITH PROPOFOL  N/A 04/29/2020   Procedure: ESOPHAGOGASTRODUODENOSCOPY (EGD) WITH PROPOFOL;  Surgeon: Regis Bill, MD;  Location: [facility] ENDOSCOPY;  Service: Endoscopy;  Laterality: N/A;   ESOPHAGOGASTRODUODENOSCOPY ENDOSCOPY  02/05/2008   EYE SURGERY     eye trauma  1970   piece of metal to eye removed in clinic   JOINT REPLACEMENT     KNEE ARTHROPLASTY Right 10/05/2020   Procedure: COMPUTER ASSISTED TOTAL KNEE ARTHROPLASTY;  Surgeon: Donato Heinz, MD;  Location: [facility] ORS;  Service: Orthopedics;  Laterality: Right;   KNEE  ARTHROPLASTY Left 06/08/2022   Procedure: COMPUTER ASSISTED TOTAL KNEE ARTHROPLASTY;  Surgeon: Donato Heinz, MD;  Location: [facility] ORS;  Service: Orthopedics;  Laterality: Left;   KNEE ARTHROSCOPY Left 12/11/2021   Procedure: ARTHROSCOPY KNEE;  Surgeon: Donato Heinz, MD;  Location: [facility] ORS;  Service: Orthopedics;  Laterality: Left;   KNEE ARTHROSCOPY W/ PARTIAL MEDIAL MENISCECTOMY Right 01/02/2011   LEFT HEART CATH AND CORONARY ANGIOGRAPHY Left 07/14/2007   Procedure: LEFT HEART CATH AND CORONARY ANGIOGRAPHY; Location: [facility]; Surgeon: Eugenia Pancoast, MD   SEPTOPLASTY     TONSILLECTOMY  02/05/2008   ULTRASOUND GUIDANCE FOR VASCULAR ACCESS Left 03/03/2021   Procedure: ULTRASOUND GUIDANCE FOR VASCULAR ACCESS, LEFT FEMORAL ARTERY;  Surgeon: Maeola Harman, MD;  Location: [facility] OR;  Service: Vascular;  Laterality: Left;   WRIST FRACTURE SURGERY Right     Family History  Problem Relation Age of Onset   Heart disease Mother    Glaucoma Mother    Irregular heart beat Mother    Cardiomyopathy Mother    Colon polyps Mother    Cataracts Mother    Hypertension Father    Heart disease Father    Cancer Father    Esophageal cancer Cousin     Social History   Socioeconomic History   Marital status: Married    Spouse name: Chip Boer   Number of children: 1   Years of education: Not on file   Highest education level: Not on file  Occupational History   Not on file  Tobacco Use   Smoking status: Former    Packs/day: 1.00    Years: 20.00    Additional pack years: 0.00    Total pack years: 20.00    Types: Cigarettes    Start date: 71    Quit date: 09/23/2006    Years since quitting: 15.9   Smokeless tobacco: Never  Vaping Use   Vaping Use: Never used  Substance and Sexual Activity   Alcohol use: No   Drug use: No   Sexual activity: Yes    Birth control/protection: None  Other Topics Concern   Not on file  Social History Narrative   Not on file   Social Determinants of  Health   Financial Resource Strain: Not on file  Food Insecurity: No Food Insecurity (06/08/2022)   Hunger Vital Sign    Worried About Running Out of Food in the Last Year: Never true    Ran Out of Food in the Last Year: Never true  Transportation Needs: No Transportation Needs (06/08/2022)   PRAPARE - Administrator, Civil Service (Medical): No    Lack of Transportation (Non-Medical): No  Physical Activity: Not on file  Stress: Not on file  Social Connections: Not on file  Intimate Partner Violence: Not At Risk (06/08/2022)   Humiliation, Afraid, Rape, and Kick questionnaire    Fear of Current or Ex-Partner: No    Emotionally Abused: No  Physically Abused: No    Sexually Abused: No     Physical Exam   Vitals:   08/14/22 0045 08/14/22 0100  BP: (!) 163/100 (!) 161/87  Pulse: 71 70  Resp: 17 (!) 23  Temp:    SpO2: 96% 94%    CONSTITUTIONAL: Well-appearing, NAD NEURO/PSYCH:  Alert and oriented x 3, no focal deficits EYES:  eyes equal and reactive ENT/NECK:  no LAD, no JVD CARDIO: Regular rate, well-perfused, normal S1 and S2 PULM:  CTAB no wheezing or rhonchi GI/GU:  non-distended, non-tender MSK/SPINE:  No gross deformities, no edema SKIN:  no rash, atraumatic   *Additional and/or pertinent findings included in MDM below  Diagnostic and Interventional Summary    EKG Interpretation  Date/Time:  Monday Aug 13 2022 23:11:06 EDT Ventricular Rate:  79 PR Interval:  150 QRS Duration: 102 QT Interval:  382 QTC Calculation: 438 R Axis:   61 Text Interpretation: Normal sinus rhythm Normal ECG When compared with ECG of 13-Jun-2022 12:03, PREVIOUS ECG IS PRESENT Confirmed by Kennis Carina [phone]) on 08/14/2022 12:52:06 AM       Labs Reviewed  CBC WITH DIFFERENTIAL/PLATELET - Abnormal; Notable for the following components:      Result Value   WBC 14.5 (*)    Neutro Abs 12.1 (*)    Monocytes Absolute 1.3 (*)    Abs Immature Granulocytes 0.23 (*)    All  other components within normal limits  BASIC METABOLIC PANEL    DG Chest Portable 1 View  Final Result      Medications  cefdinir (OMNICEF) capsule 300 mg (has no administration in time range)  azithromycin (ZITHROMAX) tablet 500 mg (has no administration in time range)     Procedures  /  Critical Care Procedures  ED Course and Medical Decision Making  Initial Impression and Ddx Question pneumonia with failed response to doxycycline.  No evidence of DVT, highly doubt PE.  Chest x-ray revealing patchy opacities.  Some hypoxia in triage but 99% on room air on my evaluation, will ambulate with pulse ox.  Past medical/surgical history that increases complexity of ED encounter: MS  Interpretation of Diagnostics I personally reviewed the EKG and my interpretation is as follows: Sinus rhythm  Chest x-ray with evidence of pneumonia on the right  Patient Reassessment and Ultimate Disposition/Management     Patient ambulated and did quite well, no hypoxia, minimal dyspnea on exertion.  I do not see a clear benefit for admission at this time.  He seems very happy to go home and try a different antibiotic regiment.  Seems reliable, he will adhere to return precautions.  Patient management required discussion with the following services or consulting groups:  None  Complexity of Problems Addressed Acute illness or injury that poses threat of life of bodily function  Additional Data Reviewed and Analyzed Further history obtained from: Prior labs/imaging results  Additional Factors Impacting ED Encounter Risk Prescriptions  Elmer Sow. Pilar Plate, MD [facility] Emergency Medicine [facility] [email or web address]  Final Clinical Impressions(s) / ED Diagnoses     ICD-10-CM   1. Pneumonia of right lung due to infectious organism, unspecified part of lung  J18.9       ED Discharge Orders          Ordered    cefdinir (OMNICEF) 300 MG capsule  2 times daily         08/14/22 0129    azithromycin (ZITHROMAX) 250 MG tablet  Daily  08/14/22 0129             Discharge Instructions Discussed with and Provided to Patient:     Discharge Instructions      You were evaluated in the Emergency Department and after careful evaluation, we did not find any emergent condition requiring admission or further testing in the hospital.  Your exam/testing today is overall reassuring.  Symptoms seem to be due to pneumonia.  Take the Omnicef and azithromycin antibiotics as directed.  Recommend deep breathing to help clear the infection.  Please return to the Emergency Department if you experience any worsening of your condition.   Thank you for allowing [place] to be a part of your care.       Sabas Sous, MD 08/14/22 0130

## 2022-08-14 NOTE — Discharge Instructions (Signed)
You were evaluated in the Emergency Department and after careful evaluation, we did not find any emergent condition requiring admission or further testing in the hospital.  Your exam/testing today is overall reassuring.  Symptoms seem to be due to pneumonia.  Take the Omnicef and azithromycin antibiotics as directed.  Recommend deep breathing to help clear the infection.  Please return to the Emergency Department if you experience any worsening of your condition.   Thank you for allowing [place] to be a part of your care.

## 2022-08-14 NOTE — ED Notes (Signed)
Discharge instructions provided by edp were discussed with pt. Pt verbalized understanding with no additional quesitons at this time. Pt ambulatory at discharge

## 2022-08-22 ENCOUNTER — Telehealth: Payer: Self-pay | Admitting: Primary Care

## 2022-08-22 NOTE — Telephone Encounter (Signed)
Pt called in bc he's had a pneumonia and has finished meds and wants to know if he should be seen sooner

## 2022-08-23 MED ORDER — PREDNISONE 20 MG PO TABS: 20.0000 mg | ORAL_TABLET | Freq: Every day | ORAL | 0 refills | Status: DC

## 2022-08-23 NOTE — Telephone Encounter (Signed)
Sounds like things are better, I wouldn't change anything. Continue Symbicort and use albuterol every 6 hours.  We can send in prednisone to have on hand if wheezing worsens 20mg  x 5 days

## 2022-08-23 NOTE — Telephone Encounter (Signed)
I spoke with the patient.  He came back from [facility] on 07/29/2022. Started feeling bad then. Urgent Care on 5/13 and ED on 5/20.  He has had 2 rounds of antibiotics, first Doxycycline and then Azithromycin and Cefdinir. He took the last antibiotics on Sunday. As of yesterday evening he is starting to feel better. His symptoms are improving. No F/C/S SOB is getting better. Still using the Incentive Spirometer. Cough with cream colored sputum. It was green in the beginning. Wheezing occasionally now. It was all the time.  Using- Albuterol inhaler- 2 puffs QID Symbicort- 2 puffs BID  He wants to know if you think he needs any other treatment so his symptoms do not worsen again. And if you think he needs to be seen sooner then 09/04/2022?

## 2022-08-23 NOTE — Telephone Encounter (Signed)
Patient is aware of below message/recommendations and voiced his understanding.  Prednisone sent to preferred pharmacy. Nothing further needed.  

## 2022-09-03 NOTE — Progress Notes (Deleted)
@Patient  ID: Allen Tapia, male    DOB: [birth year], 67 y.o.   MRN: [id number]  No chief complaint on file.   Referring provider: Kandyce Rud, MD  HPI: 67 year old male, former smoker quit 2008 (20 pack year hx). PMH significant for mild intermittent asthma, MS, barrett's esophagus, CAD, tobacco abuse. Patient of Dr. Tonia Brooms. Maintained on Symbicort 160, decreased to 1 puff twice daily with spacer. Continue Montelukast, if symptoms continues consider additional of daily antihistamine.   Previous [facility] pulmonary encounters:  02/07/2022 Patient presents today for annual OV for asthma. Breathing remains stable. No acute issues. He denies any worsening shortness of breath symptoms. He takes Symbicort 160 one puff twice daily and Singulair 10mg  at bedtime. He uses albuterol on average <1-2 times a week d/t cough. He has some nasal congestion. He is not currently using any over the counter nasal sprays. He is having some knee issues, no surgical date as of right now.  ACT score- 24   09/04/2022 Patient presents today for 54-month follow-up/asthma.         No Known Allergies  Immunization History  Administered Date(s) Administered   Influenza, High Dose Seasonal PF 12/25/2019   Influenza,inj,Quad PF,6+ Mos 01/18/2017, 01/21/2018, 01/09/2019   Influenza-Unspecified 12/24/2018   PFIZER(Purple Top)SARS-COV-2 Vaccination 06/15/2019, 07/13/2019   Pneumococcal Conjugate-13 01/21/2018   Pneumococcal Polysaccharide-23 07/23/2013, 01/29/2019   Td 09/23/2009    Past Medical History:  Diagnosis Date   Anginal pain (HCC)    atypical chest pain cardiac cath performed at that time   Arthritis    Asthma    Atypical chest pain    Barrett's esophagus    Bilateral cataracts    BPH (benign prostatic hyperplasia)    Chicken pox    Chronic constipation    Coronary artery disease 07/14/2007   a.) LHC 07/14/2007: 40% pRCA-1, 40% pRCA-2, 40% dRCA, 20% RPL1, 30% LM, 20% mLCx-1, 40% mLCx-2, 20%  OM3-1, 20% OM3-2, 20% pLAD, 40% mLAD-1, 70% mLAD-2, 40% mLAD-3, 30% dLAD, 40% D1, 40% D2 --> med mgmt.   Depression    Foreign body (FB) in soft tissue    Former tobacco use    GERD (gastroesophageal reflux disease)    History of 2019 novel coronavirus disease (COVID-19) 04/07/2019   a.) tested (+) at [facility] 04/16/2019, 05/30/2021; b.) 2 other (+) tests at home (dates unknown)   History of kidney stones    Hyperlipidemia    Hyperplastic colon polyp    Infrarenal abdominal aortic aneurysm (AAA) without rupture (HCC)    a.) MR lumbar spine 12/30/2020: infrarenal saccular AAA measuring 2.2 cm; b.) CTA A/P 01/05/2021: measured 2.6 cm; c.) s/p EVAR 03/03/2021   Ischemic heart disease 12/20/2010   Long term current use of immunosuppressive drug    a.) on ofatumumab for MS   MS (multiple sclerosis) (HCC)    a.) on ofatumumab   Shingles    Tubular adenoma of colon 08/23/2016    Tobacco History: Social History   Tobacco Use  Smoking Status Former   Packs/day: 1.00   Years: 20.00   Additional pack years: 0.00   Total pack years: 20.00   Types: Cigarettes   Start date: 65   Quit date: 09/23/2006   Years since quitting: 15.9  Smokeless Tobacco Never   Counseling given: Not Answered   Outpatient Medications Prior to Visit  Medication Sig Dispense Refill   acetaminophen (TYLENOL) 500 MG tablet Take 2 tablets (1,000 mg total) by mouth every 6 (  six) hours as needed for mild pain. (Patient not taking: Reported on 08/06/2022) 60 tablet 0   albuterol (VENTOLIN HFA) 108 (90 Base) MCG/ACT inhaler Inhale 2 puffs into the lungs every 6 (six) hours as needed for wheezing or shortness of breath. 54 g 3   amphetamine-dextroamphetamine (ADDERALL XR) 25 MG 24 hr capsule Take 25 mg by mouth daily as needed (focus).     baclofen (LIORESAL) 20 MG tablet Take 20 mg by mouth 3 (three) times daily.     budesonide-formoterol (SYMBICORT) 160-4.5 MCG/ACT inhaler Inhale 1-2 puffs into the lungs in the  morning and at bedtime. 30.6 g 3   buPROPion (WELLBUTRIN XL) 150 MG 24 hr tablet Take 150 mg by mouth every morning.     celecoxib (CELEBREX) 200 MG capsule Take 200 mg by mouth 2 (two) times daily.     enoxaparin (LOVENOX) 40 MG/0.4ML injection Inject 0.4 mLs (40 mg total) into the skin daily. (Patient not taking: Reported on 08/06/2022) 5.6 mL 0   gabapentin (NEURONTIN) 300 MG capsule Take 1-2 capsules (300-600 mg total) by mouth 2 (two) times daily. 600 mg in am and 300 mg in afternoon 60 capsule 0   mirabegron ER (MYRBETRIQ) 50 MG TB24 tablet Take 50 mg by mouth every morning.     montelukast (SINGULAIR) 10 MG tablet Take 1 tablet (10 mg total) by mouth at bedtime. 90 tablet 3   Multiple Vitamins-Minerals (MULTIVITAMIN WITH MINERALS) tablet Take 1 tablet by mouth daily.     Ofatumumab (KESIMPTA) 20 MG/0.4ML SOAJ Inject 1 Dose into the skin every 30 (thirty) days. 1st day of every month     ondansetron (ZOFRAN) 4 MG tablet Take 1 tablet (4 mg total) by mouth every 6 (six) hours as needed for nausea. 30 tablet 0   oxyCODONE (OXY IR/ROXICODONE) 5 MG immediate release tablet Take 1 tablet (5 mg total) by mouth every 4 (four) hours as needed for severe pain. (Patient not taking: Reported on 08/06/2022) 30 tablet 0   pantoprazole (PROTONIX) 20 MG tablet Take 1 tablet by mouth at bedtime.     Polyethyl Glycol-Propyl Glycol (SYSTANE) 0.4-0.3 % SOLN Apply 1 drop to eye 2 (two) times daily.     predniSONE (DELTASONE) 20 MG tablet Take 1 tablet (20 mg total) by mouth daily with breakfast. 5 tablet 0   promethazine-dextromethorphan (PROMETHAZINE-DM) 6.25-15 MG/5ML syrup Take 5 mLs by mouth 4 (four) times daily as needed for cough. 240 mL 0   rosuvastatin (CRESTOR) 20 MG tablet TAKE 1 TABLET AT BEDTIME 90 tablet 3   Spacer/Aero-Holding Chambers (AEROCHAMBER MV) inhaler Use as instructed 1 each 0   tamsulosin (FLOMAX) 0.4 MG CAPS capsule Take 1 capsule (0.4 mg total) by mouth daily. (Patient taking differently:  Take 0.4 mg by mouth daily after supper.) 90 capsule 3   traMADol (ULTRAM) 50 MG tablet Take 1-2 tablets (50-100 mg total) by mouth every 4 (four) hours as needed for moderate pain. (Patient not taking: Reported on 08/06/2022) 30 tablet 0   traZODone (DESYREL) 100 MG tablet Take 100 mg by mouth at bedtime.     Vibegron (GEMTESA) 75 MG TABS Take 75 mg by mouth daily. 30 tablet 0   VITAMIN D PO Take 1 capsule by mouth daily.     No facility-administered medications prior to visit.      Review of Systems  Review of Systems   Physical Exam  There were no vitals taken for this visit. Physical Exam   Lab Results:  CBC    Component Value Date/Time   WBC 14.5 (H) 08/13/2022 2337   RBC 4.61 08/13/2022 2337   HGB 13.9 08/13/2022 2337   HCT 43.3 08/13/2022 2337   PLT 352 08/13/2022 2337   MCV 93.9 08/13/2022 2337   MCH 30.2 08/13/2022 2337   MCHC 32.1 08/13/2022 2337   RDW 13.4 08/13/2022 2337   LYMPHSABS 0.7 08/13/2022 2337   MONOABS 1.3 (H) 08/13/2022 2337   EOSABS 0.1 08/13/2022 2337   BASOSABS 0.1 08/13/2022 2337    BMET    Component Value Date/Time   NA 141 08/13/2022 2337   K 3.9 08/13/2022 2337   CL 104 08/13/2022 2337   CO2 26 08/13/2022 2337   GLUCOSE 95 08/13/2022 2337   BUN 15 08/13/2022 2337   CREATININE 0.73 08/13/2022 2337   CALCIUM 9.1 08/13/2022 2337   GFRNONAA >60 08/13/2022 2337    BNP    Component Value Date/Time   BNP 19.4 06/13/2022 1200    ProBNP No results found for: "PROBNP"  Imaging: DG Chest Portable 1 View  Result Date: 08/13/2022 CLINICAL DATA:  Short of breath EXAM: PORTABLE CHEST 1 VIEW COMPARISON:  08/06/2022 FINDINGS: Single frontal view of the chest demonstrates a stable cardiac silhouette. There is patchy right basilar airspace disease. No effusion or pneumothorax. Stable metallic density overlying left mid chest. IMPRESSION: 1. Patchy right basilar airspace disease consistent with pneumonia. Electronically Signed   By: Sharlet Salina M.D.   On: 08/13/2022 23:37   DG Chest 2 View  Result Date: 08/06/2022 CLINICAL DATA:  Hypoxia EXAM: CHEST - 2 VIEW COMPARISON:  06/13/2022 FINDINGS: The heart size and mediastinal contours are within normal limits. Aortic atherosclerosis. Tortuosity of the descending thoracic aorta. Both lungs are clear. The visualized skeletal structures are unremarkable. Unchanged metallic density overlying the mid left chest wall anteriorly. IMPRESSION: No active cardiopulmonary disease. Electronically Signed   By: Duanne Guess D.O.   On: 08/06/2022 09:42     Assessment & Plan:   No problem-specific Assessment & Plan notes found for this encounter.     Glenford Bayley, NP 09/03/2022

## 2022-09-04 ENCOUNTER — Encounter: Payer: Self-pay | Admitting: Pulmonary Disease

## 2022-09-04 ENCOUNTER — Ambulatory Visit (INDEPENDENT_AMBULATORY_CARE_PROVIDER_SITE_OTHER): Payer: Medicare Other | Admitting: Pulmonary Disease

## 2022-09-04 ENCOUNTER — Ambulatory Visit: Payer: Medicare Other | Admitting: Primary Care

## 2022-09-04 VITALS — BP 138/80 | HR 68 | Temp 97.9°F | Ht 72.0 in | Wt 201.0 lb

## 2022-09-04 DIAGNOSIS — J4520 Mild intermittent asthma, uncomplicated: Secondary | ICD-10-CM | POA: Diagnosis not present

## 2022-09-04 DIAGNOSIS — J40 Bronchitis, not specified as acute or chronic: Secondary | ICD-10-CM | POA: Diagnosis not present

## 2022-09-04 NOTE — Patient Instructions (Addendum)
Nice to meet you  I am glad your oxygen numbers are ok today and that symptoms seem to be slowly improving  With the extended symptoms despite multiple rounds of antibiotics I worry about activation of worsening asthma symptoms  Use Breztri 2 puffs twice a day with a spacer - rinse mouth after every use  Stop Symbicort while using Breztri   Continue Albuterol as needed  Return to clinic in 3 months or sooner as needed

## 2022-09-06 NOTE — Progress Notes (Signed)
@Patient  ID: Allen Tapia, male    DOB: 09/27/1955, 67 y.o.   MRN: [id number]  Chief Complaint  Patient presents with   Acute Visit    Pt c/o wheezing, increased SOB, cough- prod with white to clear sputum- onset 5/7 doxy and pred given by urgent care and has been to ED with these symptoms and txed with 2 other abx.     Referring provider: Kandyce Rud, MD  HPI:   67 y.o. man whom we are seeing for an acute visit with recent diagnosis of pneumonia and gradually improving bronchitis-like symptoms.  Most recent pulmonary note x 2 reviewed.  ED note x 2 07/2022 reviewed.  Came back from traveling.  Shortly after developed congestion.  Settled on chest, wheeze etc.  Went to urgent care treated conservatively.  Symptoms not improved so again return to urgent care/ED.  Chest x-ray obtained on my review interpretation reveals clear lungs bilaterally.  Got antibiotics.  Did not improve.  Returned about a week later, chest x-ray on my review interpretation reveals small right lower lobe infiltrate atelectasis versus pneumonia.  WBCs were elevated on labs.  He was diagnosed with pneumonia.  Given additional antibiotics.  Steroids as well.  Slowly things are getting better.  He is not back to baseline.   Questionaires / Pulmonary Flowsheets:   ACT:  Asthma Control Test ACT Total Score  02/07/2022  9:37 AM 24  06/06/2020 10:13 AM 23  12/07/2019 10:33 AM 21    MMRC:     No data to display          Epworth:      No data to display          Tests:   FENO:  No results found for: "NITRICOXIDE"  PFT:     No data to display          WALK:      No data to display          Imaging: Personally reviewed DG Chest Portable 1 View  Result Date: 08/13/2022 CLINICAL DATA:  Short of breath EXAM: PORTABLE CHEST 1 VIEW COMPARISON:  08/06/2022 FINDINGS: Single frontal view of the chest demonstrates a stable cardiac silhouette. There is patchy right basilar airspace  disease. No effusion or pneumothorax. Stable metallic density overlying left mid chest. IMPRESSION: 1. Patchy right basilar airspace disease consistent with pneumonia. Electronically Signed   By: Sharlet Salina M.D.   On: 08/13/2022 23:37    Lab Results: Personally reviewed and as per EMR discussion this note CBC    Component Value Date/Time   WBC 14.5 (H) 08/13/2022 2337   RBC 4.61 08/13/2022 2337   HGB 13.9 08/13/2022 2337   HCT 43.3 08/13/2022 2337   PLT 352 08/13/2022 2337   MCV 93.9 08/13/2022 2337   MCH 30.2 08/13/2022 2337   MCHC 32.1 08/13/2022 2337   RDW 13.4 08/13/2022 2337   LYMPHSABS 0.7 08/13/2022 2337   MONOABS 1.3 (H) 08/13/2022 2337   EOSABS 0.1 08/13/2022 2337   BASOSABS 0.1 08/13/2022 2337    BMET    Component Value Date/Time   NA 141 08/13/2022 2337   K 3.9 08/13/2022 2337   CL 104 08/13/2022 2337   CO2 26 08/13/2022 2337   GLUCOSE 95 08/13/2022 2337   BUN 15 08/13/2022 2337   CREATININE 0.73 08/13/2022 2337   CALCIUM 9.1 08/13/2022 2337   GFRNONAA >60 08/13/2022 2337    BNP    Component Value Date/Time  BNP 19.4 06/13/2022 1200    ProBNP No results found for: "PROBNP"  Specialty Problems       Pulmonary Problems   Dyspnea, unspecified   Mild intermittent asthma    No Known Allergies  Immunization History  Administered Date(s) Administered   Influenza, High Dose Seasonal PF 12/25/2019   Influenza,inj,Quad PF,6+ Mos 01/18/2017, 01/21/2018, 01/09/2019   Influenza-Unspecified 12/24/2018   PFIZER(Purple Top)SARS-COV-2 Vaccination 06/15/2019, 07/13/2019   Pneumococcal Conjugate-13 01/21/2018   Pneumococcal Polysaccharide-23 07/23/2013, 01/29/2019   Td 09/23/2009    Past Medical History:  Diagnosis Date   Anginal pain (HCC)    atypical chest pain cardiac cath performed at that time   Arthritis    Asthma    Atypical chest pain    Barrett's esophagus    Bilateral cataracts    BPH (benign prostatic hyperplasia)    Chicken pox     Chronic constipation    Coronary artery disease 07/14/2007   a.) LHC 07/14/2007: 40% pRCA-1, 40% pRCA-2, 40% dRCA, 20% RPL1, 30% LM, 20% mLCx-1, 40% mLCx-2, 20% OM3-1, 20% OM3-2, 20% pLAD, 40% mLAD-1, 70% mLAD-2, 40% mLAD-3, 30% dLAD, 40% D1, 40% D2 --> med mgmt.   Depression    Foreign body (FB) in soft tissue    Former tobacco use    GERD (gastroesophageal reflux disease)    History of 2019 novel coronavirus disease (COVID-19) 04/07/2019   a.) tested (+) at [facility] 04/16/2019, 05/30/2021; b.) 2 other (+) tests at home (dates unknown)   History of kidney stones    Hyperlipidemia    Hyperplastic colon polyp    Infrarenal abdominal aortic aneurysm (AAA) without rupture (HCC)    a.) MR lumbar spine 12/30/2020: infrarenal saccular AAA measuring 2.2 cm; b.) CTA A/P 01/05/2021: measured 2.6 cm; c.) s/p EVAR 03/03/2021   Ischemic heart disease 12/20/2010   Long term current use of immunosuppressive drug    a.) on ofatumumab for MS   MS (multiple sclerosis) (HCC)    a.) on ofatumumab   Shingles    Tubular adenoma of colon 08/23/2016    Tobacco History: Social History   Tobacco Use  Smoking Status Former   Packs/day: 1.00   Years: 20.00   Additional pack years: 0.00   Total pack years: 20.00   Types: Cigarettes   Start date: 64   Quit date: 09/23/2006   Years since quitting: 15.9  Smokeless Tobacco Never   Counseling given: Not Answered   Continue to not smoke  Outpatient Encounter Medications as of 09/04/2022  Medication Sig   acetaminophen (TYLENOL) 500 MG tablet Take 2 tablets (1,000 mg total) by mouth every 6 (six) hours as needed for mild pain.   albuterol (VENTOLIN HFA) 108 (90 Base) MCG/ACT inhaler Inhale 2 puffs into the lungs every 6 (six) hours as needed for wheezing or shortness of breath.   amphetamine-dextroamphetamine (ADDERALL XR) 25 MG 24 hr capsule Take 25 mg by mouth daily as needed (focus).   baclofen (LIORESAL) 20 MG tablet Take 20 mg by mouth 3  (three) times daily.   budesonide-formoterol (SYMBICORT) 160-4.5 MCG/ACT inhaler Inhale 1-2 puffs into the lungs in the morning and at bedtime.   buPROPion (WELLBUTRIN XL) 150 MG 24 hr tablet Take 150 mg by mouth every morning.   celecoxib (CELEBREX) 200 MG capsule Take 200 mg by mouth 2 (two) times daily.   gabapentin (NEURONTIN) 300 MG capsule Take 1-2 capsules (300-600 mg total) by mouth 2 (two) times daily. 600 mg in am and  300 mg in afternoon   montelukast (SINGULAIR) 10 MG tablet Take 1 tablet (10 mg total) by mouth at bedtime.   Multiple Vitamins-Minerals (MULTIVITAMIN WITH MINERALS) tablet Take 1 tablet by mouth daily.   Ofatumumab (KESIMPTA) 20 MG/0.4ML SOAJ Inject 1 Dose into the skin every 30 (thirty) days. 1st day of every month   ondansetron (ZOFRAN) 4 MG tablet Take 1 tablet (4 mg total) by mouth every 6 (six) hours as needed for nausea.   pantoprazole (PROTONIX) 20 MG tablet Take 1 tablet by mouth at bedtime.   Polyethyl Glycol-Propyl Glycol (SYSTANE) 0.4-0.3 % SOLN Apply 1 drop to eye 2 (two) times daily.   promethazine-dextromethorphan (PROMETHAZINE-DM) 6.25-15 MG/5ML syrup Take 5 mLs by mouth 4 (four) times daily as needed for cough.   rosuvastatin (CRESTOR) 20 MG tablet TAKE 1 TABLET AT BEDTIME   Spacer/Aero-Holding Chambers (AEROCHAMBER MV) inhaler Use as instructed   tamsulosin (FLOMAX) 0.4 MG CAPS capsule Take 1 capsule (0.4 mg total) by mouth daily. (Patient taking differently: Take 0.4 mg by mouth daily after supper.)   traZODone (DESYREL) 100 MG tablet Take 100 mg by mouth at bedtime.   Vibegron (GEMTESA) 75 MG TABS Take 75 mg by mouth daily.   VITAMIN D PO Take 1 capsule by mouth daily.   [DISCONTINUED] enoxaparin (LOVENOX) 40 MG/0.4ML injection Inject 0.4 mLs (40 mg total) into the skin daily.   [DISCONTINUED] mirabegron ER (MYRBETRIQ) 50 MG TB24 tablet Take 50 mg by mouth every morning.   [DISCONTINUED] oxyCODONE (OXY IR/ROXICODONE) 5 MG immediate release tablet Take 1  tablet (5 mg total) by mouth every 4 (four) hours as needed for severe pain.   [DISCONTINUED] predniSONE (DELTASONE) 20 MG tablet Take 1 tablet (20 mg total) by mouth daily with breakfast.   [DISCONTINUED] traMADol (ULTRAM) 50 MG tablet Take 1-2 tablets (50-100 mg total) by mouth every 4 (four) hours as needed for moderate pain.   No facility-administered encounter medications on file as of 09/04/2022.     Review of Systems  Review of Systems  No chest pain with exertion.  No orthopnea or PND.  Comprehensive review of systems otherwise negative. Physical Exam  BP 138/80 (BP Location: Left Arm, Cuff Size: Normal)   Pulse 68   Temp 97.9 F (36.6 C) (Oral)   Ht 6' (1.829 m)   Wt 201 lb (91.2 kg)   SpO2 95% Comment: on RA  BMI 27.26 kg/m   Wt Readings from Last 5 Encounters:  09/04/22 201 lb (91.2 kg)  06/13/22 206 lb (93.4 kg)  06/08/22 200 lb (90.7 kg)  06/06/22 204 lb 12.9 oz (92.9 kg)  04/11/22 202 lb (91.6 kg)    BMI Readings from Last 5 Encounters:  09/04/22 27.26 kg/m  06/13/22 27.94 kg/m  06/08/22 27.12 kg/m  06/06/22 27.78 kg/m  04/11/22 27.40 kg/m     Physical Exam General: Sitting in chair, no acute distress Eyes: EOMI, no icterus Neck: Supple, no JVP Pulmonary: Clear, no work of breathing, some upper airway noises with deep breathing Cardiovascular: Warm, no edema Abdomen: Nondistended, bowel tones present MSK: No synovitis, no joint effusion Neuro: Normal gait, no weakness Psych: Normal mood, full affect   Assessment & Plan:   Right-sided pneumonia: Minimal infiltrate on chest x-ray, looks more like atelectasis to my eye.  Would benefit from repeat chest x-ray at next visit to ensure resolution.  Prolonged bronchitis symptoms: For the last few weeks.  Chest x-ray relatively clear with the exception of discussion above.  Gradually  improving with time.  Multiple rounds of antibiotics without significant improvement.  Likely prolonged asthma  exacerbation.  Escalate inhaler, stop Symbicort start Breztri, samples they can prescribe if you find more beneficial.  Continue albuterol as needed   Return in about 3 months (around 12/05/2022) for With Buelah Manis, NP or Dr. Tonia Brooms or Dr. Judeth Horn.   Karren Burly, MD 09/06/2022  I spent 41 minutes in care of patient including face-to-face visit, review of records, coordination of care.

## 2022-10-19 ENCOUNTER — Other Ambulatory Visit: Payer: Self-pay | Admitting: Urology

## 2022-10-19 DIAGNOSIS — N3281 Overactive bladder: Secondary | ICD-10-CM

## 2022-10-24 ENCOUNTER — Other Ambulatory Visit: Payer: Self-pay

## 2022-10-24 ENCOUNTER — Ambulatory Visit (INDEPENDENT_AMBULATORY_CARE_PROVIDER_SITE_OTHER): Payer: Medicare Other | Admitting: Cardiology

## 2022-10-24 ENCOUNTER — Encounter (HOSPITAL_BASED_OUTPATIENT_CLINIC_OR_DEPARTMENT_OTHER): Payer: Self-pay | Admitting: Cardiology

## 2022-10-24 ENCOUNTER — Other Ambulatory Visit: Payer: Self-pay | Admitting: Urology

## 2022-10-24 VITALS — BP 152/80 | HR 63 | Ht 72.0 in | Wt 197.0 lb

## 2022-10-24 DIAGNOSIS — Z8249 Family history of ischemic heart disease and other diseases of the circulatory system: Secondary | ICD-10-CM | POA: Diagnosis not present

## 2022-10-24 DIAGNOSIS — Z7189 Other specified counseling: Secondary | ICD-10-CM

## 2022-10-24 DIAGNOSIS — E7800 Pure hypercholesterolemia, unspecified: Secondary | ICD-10-CM | POA: Diagnosis not present

## 2022-10-24 DIAGNOSIS — I2510 Atherosclerotic heart disease of native coronary artery without angina pectoris: Secondary | ICD-10-CM

## 2022-10-24 DIAGNOSIS — N3281 Overactive bladder: Secondary | ICD-10-CM

## 2022-10-24 DIAGNOSIS — E785 Hyperlipidemia, unspecified: Secondary | ICD-10-CM

## 2022-10-24 MED ORDER — MIRABEGRON ER 50 MG PO TB24
50.0000 mg | ORAL_TABLET | Freq: Every day | ORAL | 3 refills | Status: DC
Start: 2022-10-24 — End: 2023-06-05

## 2022-10-24 MED ORDER — GEMTESA 75 MG PO TABS: 75.0000 mg | ORAL_TABLET | Freq: Every day | ORAL | 11 refills | Status: DC

## 2022-10-24 NOTE — Progress Notes (Signed)
Cardiology Office Note:  .    Date:  10/24/2022  ID:  DANTRE SCHEIRER, DOB June 11, 1955, MRN [id number] PCP: Kandyce Rud, MD  [facility] Providers Cardiologist:  Jodelle Red, MD     History of Present Illness: .    Allen Tapia is a 67 y.o. male with a hx of coronary disease, multiple sclerosis, squamous cell skin cancer who is seen for follow up. I initially met him 05/04/19 as a new consult at the request of Kandyce Rud, MD for the evaluation and management of coronary artery disease.   Cardiac history: Cardiac cath ([facility]) 07/14/2007: obstructive LAD disease only, not intervened on as there was no matching ischemia on stress testing/atypical symptoms, EF 65%. Other vessels with nonobstructive lesions Risk: hyperlipidemia, former tobacco use.    Has chronic MSK chest pain, always sternal. Occurs with very specific movements, and he knows what will trigger it.  Have discussed adderall at prior visit.   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    He followed up with Eligha Bridegroom, NP on 12/05/2021 for preoperative clearance. He complained of occasional palpitations lasting a few seconds.   At his visit 01/2022 he was struggling with limited mobility due to issues with his knees. He had a right total knee arthroplasty one year prior, and since 10/2021 he had suffered a left torn meniscus and ACL. For treatment he was frequently taking tylenol. No changes made.   He underwent left total knee arthroplasty 06/08/2022 performed by Dr. Francesco Sor. Subsequently presented to the ED 06/13/2022 with hypotension of unclear etiology. No sign for leukocytosis. Creatinine at baseline. No significant electrolyte abnormalities. Troponin x 2 negative. CTA with no signs of pulmonary embolism; atelectasis and scarring which is consistent with prior COVID infection and history of COPD. Orthostatic blood  pressures were negative.  On 08/06/2022 he went to urgent care for concerns of hypoxia and COPD exacerbation. Patient's O2 sat was 88% on room air upon arrival; increased to 91-93% on 3 L O2 via nasal cannula. Declined transfer to the ED despite his low oxygen level. Albuterol nebulizer treatment given. CXR negative. O2 sat stayed at 91-92% on room air.  Was treating with prednisone and doxycycline; continued use of inhalers. Again presented to the ED 08/13/2022 with symptoms concerning for pneumonia with failed response to doxycycline. No evidence of DVT, highly doubt PE. Chest x-ray revealing patchy opacities. Some hypoxia in triage but 99% on room air on evaluation.   Today, he appears well. We reviewed his recent ED visits. Notes that his wife had said he was unresponsive for seconds at a time during his hypotensive episodes at his ED visits.  In the office his blood pressure is 159/76 initially which he attributes to the ride over here. Typically 120s/80s at home. On manual recheck, his blood pressure is 152/80.  No concerning chest pain or pressure. He continues to have known musculoskeletal chest pain, reoccurs occasionally such as when preparing foods for a long time at countertop, looking down. Typically resolves with stretching exercises. May take tylenol.   Occasional palpitations, nothing that limits him. Episodes are very brief.  Lately he has remained active. Recently mowed 2 yards with push mower, and another with riding mower. He continues to avoid smoking.  Recently his brother suffered severe health issues. He recently had an MI at age 37 resulting in CABG, with multiple cardiac arrests, ultimately  requiring ECMO and impella. Hospitalized for a total of 65 days. He is now back at home and participating in cardiac rehab. He did not have chest pain prior to his event, did notice worsening shortness of breath. No prior cardiac history before this.  He denies any peripheral edema,  lightheadedness, headaches, orthopnea, or PND.  ROS:  Please see the history of present illness. ROS otherwise negative except as noted.  (+) Occasional palpitations (+) Musculoskeletal chest pain  Studies Reviewed: Marland Kitchen       No new  Physical Exam:    VS:  BP (!) 152/80 (BP Location: Left Arm, Patient Position: Sitting, Cuff Size: Normal)   Pulse 63   Ht 6' (1.829 m)   Wt 197 lb (89.4 kg)   SpO2 96%   BMI 26.72 kg/m    Wt Readings from Last 3 Encounters:  10/24/22 197 lb (89.4 kg)  09/04/22 201 lb (91.2 kg)  06/13/22 206 lb (93.4 kg)    GEN: Well nourished, well developed in no acute distress HEENT: Normal, moist mucous membranes NECK: No JVD CARDIAC: regular rhythm, normal S1 and S2, no rubs or gallops. No murmur. VASCULAR: Radial and DP pulses 2+ bilaterally. No carotid bruits RESPIRATORY:  Clear to auscultation without rales, wheezing or rhonchi  ABDOMEN: Soft, non-tender, non-distended MUSCULOSKELETAL:  Ambulates independently SKIN: Warm and dry, no edema NEUROLOGIC:  Alert and oriented x 3. No focal neuro deficits noted. PSYCHIATRIC:  Normal affect   ASSESSMENT AND PLAN: .    Coronary artery disease without angina: diagnosed by cath at [facility] Hypercholesterolemia Family history of heart disease -no typical angina -on aspirin 81 mg daily -Has been counseled on adderall and NSAIDs with known CAD -doing well off metoprolol -discussed red flag symptoms that need immediate medical attention -tolerating rosuvastatin 20 mg daily -we discussed guidelines at length for monitoring for CAD/ischemia. He has been asymptomatic. Discussed recommendations that testing are symptom based at this time. Will check lpa given his strong family history, discussed role of PCSK9i if this is elevated.   Cardiac risk counseling and prevention recommendations: -recommend heart healthy/Mediterranean diet, with whole grains, fruits, vegetable, fish, lean meats, nuts, and olive oil. Limit  salt. -recommend moderate walking, 3-5 times/week for 30-50 minutes each session. Aim for at least 150 minutes.week. Goal should be pace of 3 miles/hours, or walking 1.5 miles in 30 minutes -recommend avoidance of tobacco products. Avoid excess alcohol.  Dispo: Follow-up in 1 year, or sooner as needed.  I,Mathew Stumpf,acting as a Neurosurgeon for [employer], MD.,have documented all relevant documentation on the behalf of Jodelle Red, MD,as directed by  Jodelle Red, MD while in the presence of Jodelle Red, MD.  I, Jodelle Red, MD, have reviewed all documentation for this visit. The documentation on 10/24/22 for the exam, diagnosis, procedures, and orders are all accurate and complete.   Signed, Jodelle Red, MD

## 2022-10-24 NOTE — Patient Instructions (Addendum)
Medication Instructions:  Your physician recommends that you continue on your current medications as directed. Please refer to the Current Medication list given to you today.   *If you need a refill on your cardiac medications before your next appointment, please call your pharmacy*  Lab Work: [facility] TODAY  If you have labs (blood work) drawn today and your tests are completely normal, you will receive your results only by: MyChart Message (if you have MyChart) OR A paper copy in the mail If you have any lab test that is abnormal or we need to change your treatment, we will call you to review the results.  Testing/Procedures: NONE  Follow-Up: At [facility], you and your health needs are our priority.  As part of our continuing mission to provide you with exceptional heart care, we have created designated Provider Care Teams.  These Care Teams include your primary Cardiologist (physician) and Advanced Practice Providers (APPs -  Physician Assistants and Nurse Practitioners) who all work together to provide you with the care you need, when you need it.  We recommend signing up for the patient portal called "MyChart".  Sign up information is provided on this After Visit Summary.  MyChart is used to connect with patients for Virtual Visits (Telemedicine).  Patients are able to view lab/test results, encounter notes, upcoming appointments, etc.  Non-urgent messages can be sent to your provider as well.   To learn more about what you can do with MyChart, go to [email or web address].    Your next appointment:   12 month(s)  Provider:   Jodelle Red, MD or Gillian Shields, NP    Other Instructions  Check blood pressures a few times/week for the next few weeks and send them to me in mychart.

## 2022-10-25 ENCOUNTER — Other Ambulatory Visit: Payer: Self-pay | Admitting: Primary Care

## 2022-10-31 ENCOUNTER — Telehealth: Payer: Self-pay | Admitting: Primary Care

## 2022-10-31 NOTE — Telephone Encounter (Signed)
[facility] to [facility] pharmacist and gave verbal okay to change VENTOLIN HFA 108 (90 Base) MCG/ACT inhaler to Albuterol ProAir inhaler. NFN

## 2022-10-31 NOTE — Telephone Encounter (Signed)
Needs verbal for preferred alternative to ventolin. It is albuterol PA ( ). The former is not covered by insurance.[id number] is reference number. We can escribe, fax or verbal order. Marking as urgent as this needs to be resolved by end of day. Please advise.

## 2022-11-14 ENCOUNTER — Encounter: Payer: Self-pay | Admitting: Pulmonary Disease

## 2022-11-15 MED ORDER — BREZTRI AEROSPHERE 160-9-4.8 MCG/ACT IN AERO: 2.0000 | INHALATION_SPRAY | Freq: Two times a day (BID) | RESPIRATORY_TRACT | 1 refills | Status: DC

## 2022-11-21 ENCOUNTER — Telehealth: Payer: Self-pay

## 2022-11-21 NOTE — Telephone Encounter (Signed)
Initiated PA for [employer] via [employer], insurance will not cover.

## 2022-12-05 ENCOUNTER — Ambulatory Visit (INDEPENDENT_AMBULATORY_CARE_PROVIDER_SITE_OTHER): Payer: Medicare Other | Admitting: Pulmonary Disease

## 2022-12-05 ENCOUNTER — Ambulatory Visit (INDEPENDENT_AMBULATORY_CARE_PROVIDER_SITE_OTHER): Payer: Medicare Other

## 2022-12-05 ENCOUNTER — Encounter: Payer: Self-pay | Admitting: Pulmonary Disease

## 2022-12-05 VITALS — BP 122/80 | HR 63 | Temp 97.4°F | Ht 72.0 in | Wt 204.0 lb

## 2022-12-05 DIAGNOSIS — J4540 Moderate persistent asthma, uncomplicated: Secondary | ICD-10-CM

## 2022-12-05 NOTE — Patient Instructions (Signed)
Continue Breztri  Chest xray today  Return to clinic in 6 months or sooner as needed

## 2022-12-05 NOTE — Progress Notes (Signed)
@Patient  ID: Allen Tapia, male    DOB: February 09, 1956, 67 y.o.   MRN: [id number]  Chief Complaint  Patient presents with   Follow-up    Breathing has been good  ACT 24    Referring provider: Kandyce Rud, MD  HPI:   67 y.o. man whom we are seeing for an acute visit with recent diagnosis of pneumonia and gradually improving bronchitis-like symptoms.  Most recent cardiology note reviewed.  Telephone encounter be available should be in interim reviewed.  Overall doing quite well.  No history of cough or dyspnea.  Very well-controlled.  Adherence to [employer].  There is typically winters like falls are more difficult times.  Advised to continue on current regimen.  Previously was on Symbicort and would have breakthrough symptoms during these times.  Discussed role and rationale for higher or more intense.  Discussed role and rationale for repeat chest imaging to ensure resolution of prior pneumonia 07/2022.  HPI at initial visit: Came back from traveling.  Shortly after developed congestion.  Settled on chest, wheeze etc.  Went to urgent care treated conservatively.  Symptoms not improved so again return to urgent care/ED.  Chest x-ray obtained on my review interpretation reveals clear lungs bilaterally.  Got antibiotics.  Did not improve.  Returned about a week later, chest x-ray on my review interpretation reveals small right lower lobe infiltrate atelectasis versus pneumonia.  WBCs were elevated on labs.  He was diagnosed with pneumonia.  Given additional antibiotics.  Steroids as well.  Slowly things are getting better.  He is not back to baseline.   Questionaires / Pulmonary Flowsheets:   ACT:  Asthma Control Test ACT Total Score  12/05/2022 11:27 AM 24  02/07/2022  9:37 AM 24  06/06/2020 10:13 AM 23    MMRC:     No data to display          Epworth:      No data to display          Tests:   FENO:  No results found for: "NITRICOXIDE"  PFT:     No data to  display          WALK:      No data to display          Imaging: Personally reviewed No results found.  Lab Results: Personally reviewed and as per EMR discussion this note CBC    Component Value Date/Time   WBC 14.5 (H) 08/13/2022 2337   RBC 4.61 08/13/2022 2337   HGB 13.9 08/13/2022 2337   HCT 43.3 08/13/2022 2337   PLT 352 08/13/2022 2337   MCV 93.9 08/13/2022 2337   MCH 30.2 08/13/2022 2337   MCHC 32.1 08/13/2022 2337   RDW 13.4 08/13/2022 2337   LYMPHSABS 0.7 08/13/2022 2337   MONOABS 1.3 (H) 08/13/2022 2337   EOSABS 0.1 08/13/2022 2337   BASOSABS 0.1 08/13/2022 2337    BMET    Component Value Date/Time   NA 141 08/13/2022 2337   K 3.9 08/13/2022 2337   CL 104 08/13/2022 2337   CO2 26 08/13/2022 2337   GLUCOSE 95 08/13/2022 2337   BUN 15 08/13/2022 2337   CREATININE 0.73 08/13/2022 2337   CALCIUM 9.1 08/13/2022 2337   GFRNONAA >60 08/13/2022 2337    BNP    Component Value Date/Time   BNP 19.4 06/13/2022 1200    ProBNP No results found for: "PROBNP"  Specialty Problems       Pulmonary Problems  Dyspnea, unspecified   Mild intermittent asthma    No Known Allergies  Immunization History  Administered Date(s) Administered   Influenza, High Dose Seasonal PF 12/25/2019   Influenza,inj,Quad PF,6+ Mos 01/18/2017, 01/21/2018, 01/09/2019   Influenza-Unspecified 12/24/2018   PFIZER(Purple Top)SARS-COV-2 Vaccination 06/15/2019, 07/13/2019   Pneumococcal Conjugate-13 01/21/2018   Pneumococcal Polysaccharide-23 07/23/2013, 01/29/2019   Td 09/23/2009   Zoster Recombinant(Shingrix) 07/24/2016, 12/13/2016    Past Medical History:  Diagnosis Date   Anginal pain (HCC)    atypical chest pain cardiac cath performed at that time   Arthritis    Asthma    Atypical chest pain    Barrett's esophagus    Bilateral cataracts    BPH (benign prostatic hyperplasia)    Chicken pox    Chronic constipation    Coronary artery disease 07/14/2007   a.)  LHC 07/14/2007: 40% pRCA-1, 40% pRCA-2, 40% dRCA, 20% RPL1, 30% LM, 20% mLCx-1, 40% mLCx-2, 20% OM3-1, 20% OM3-2, 20% pLAD, 40% mLAD-1, 70% mLAD-2, 40% mLAD-3, 30% dLAD, 40% D1, 40% D2 --> med mgmt.   Depression    Foreign body (FB) in soft tissue    Former tobacco use    GERD (gastroesophageal reflux disease)    History of 2019 novel coronavirus disease (COVID-19) 04/07/2019   a.) tested (+) [facility] 04/16/2019, 05/30/2021; b.) 2 other (+) tests at home (dates unknown)   History of kidney stones    Hyperlipidemia    Hyperplastic colon polyp    Infrarenal abdominal aortic aneurysm (AAA) without rupture (HCC)    a.) MR lumbar spine 12/30/2020: infrarenal saccular AAA measuring 2.2 cm; b.) CTA A/P 01/05/2021: measured 2.6 cm; c.) s/p EVAR 03/03/2021   Ischemic heart disease 12/20/2010   Long term current use of immunosuppressive drug    a.) on ofatumumab for MS   MS (multiple sclerosis) (HCC)    a.) on ofatumumab   Shingles    Tubular adenoma of colon 08/23/2016    Tobacco History: Social History   Tobacco Use  Smoking Status Former   Current packs/day: 0.00   Average packs/day: 1 pack/day for 32.5 years (32.5 ttl pk-yrs)   Types: Cigarettes   Start date: 87   Quit date: 09/23/2006   Years since quitting: 16.2  Smokeless Tobacco Never   Counseling given: Not Answered   Continue to not smoke  Outpatient Encounter Medications as of 12/05/2022  Medication Sig   acetaminophen (TYLENOL) 500 MG tablet Take 2 tablets (1,000 mg total) by mouth every 6 (six) hours as needed for mild pain.   amphetamine-dextroamphetamine (ADDERALL XR) 25 MG 24 hr capsule Take 25 mg by mouth daily as needed (focus).   aspirin EC 81 MG tablet Take 81 mg by mouth daily. Swallow whole.   baclofen (LIORESAL) 20 MG tablet Take 20 mg by mouth 3 (three) times daily.   Budeson-Glycopyrrol-Formoterol (BREZTRI AEROSPHERE) 160-9-4.8 MCG/ACT AERO Inhale 2 puffs into the lungs in the morning and at  bedtime.   buPROPion (WELLBUTRIN XL) 150 MG 24 hr tablet Take 150 mg by mouth every morning.   celecoxib (CELEBREX) 200 MG capsule Take 200 mg by mouth 2 (two) times daily.   gabapentin (NEURONTIN) 300 MG capsule Take 1-2 capsules (300-600 mg total) by mouth 2 (two) times daily. 600 mg in am and 300 mg in afternoon   mirabegron ER (MYRBETRIQ) 50 MG TB24 tablet Take 1 tablet (50 mg total) by mouth daily.   montelukast (SINGULAIR) 10 MG tablet Take 1 tablet (10 mg total)  by mouth at bedtime.   Multiple Vitamins-Minerals (MULTIVITAMIN WITH MINERALS) tablet Take 1 tablet by mouth daily.   Ofatumumab (KESIMPTA) 20 MG/0.4ML SOAJ Inject 1 Dose into the skin every 30 (thirty) days. 1st day of every month   pantoprazole (PROTONIX) 20 MG tablet Take 1 tablet by mouth at bedtime.   Polyethyl Glycol-Propyl Glycol (SYSTANE) 0.4-0.3 % SOLN Apply 1 drop to eye 2 (two) times daily.   promethazine-dextromethorphan (PROMETHAZINE-DM) 6.25-15 MG/5ML syrup Take 5 mLs by mouth 4 (four) times daily as needed for cough.   rosuvastatin (CRESTOR) 20 MG tablet TAKE 1 TABLET AT BEDTIME   Spacer/Aero-Holding Chambers (AEROCHAMBER MV) inhaler Use as instructed   tamsulosin (FLOMAX) 0.4 MG CAPS capsule Take 1 capsule (0.4 mg total) by mouth daily. (Patient taking differently: Take 0.4 mg by mouth daily after supper.)   traZODone (DESYREL) 100 MG tablet Take 100 mg by mouth at bedtime.   VENTOLIN HFA 108 (90 Base) MCG/ACT inhaler USE 2 INHALATIONS EVERY 6 HOURS AS NEEDED FOR WHEEZING OR SHORTNESS OF BREATH   Vibegron (GEMTESA) 75 MG TABS Take 1 tablet (75 mg total) by mouth daily.   VITAMIN D PO Take 1 capsule by mouth daily.   [DISCONTINUED] budesonide-formoterol (SYMBICORT) 160-4.5 MCG/ACT inhaler Inhale 1-2 puffs into the lungs in the morning and at bedtime. (Patient not taking: Reported on 12/05/2022)   No facility-administered encounter medications on file as of 12/05/2022.     Review of Systems  Review of Systems   N/a Physical Exam  BP 122/80 (BP Location: Left Arm, Patient Position: Sitting, Cuff Size: Normal)   Pulse 63   Temp (!) 97.4 F (36.3 C) (Temporal)   Ht 6' (1.829 m)   Wt 204 lb (92.5 kg)   SpO2 93%   BMI 27.67 kg/m   Wt Readings from Last 5 Encounters:  12/05/22 204 lb (92.5 kg)  10/24/22 197 lb (89.4 kg)  09/04/22 201 lb (91.2 kg)  06/13/22 206 lb (93.4 kg)  06/08/22 200 lb (90.7 kg)    BMI Readings from Last 5 Encounters:  12/05/22 27.67 kg/m  10/24/22 26.72 kg/m  09/04/22 27.26 kg/m  06/13/22 27.94 kg/m  06/08/22 27.12 kg/m     Physical Exam General: Sitting in chair, no acute distress Eyes: EOMI, no icterus Neck: Supple, no JVP Pulmonary: Clear, no work of breathing,  Cardiovascular: Warm, no edema Abdomen: Nondistended, bowel tones present MSK: No synovitis, no joint effusion Neuro: Normal gait, no weakness Psych: Normal mood, full affect   Assessment & Plan:   Right-sided pneumonia: Minimal infiltrate on chest x-ray, looks more like atelectasis to my eye.  Repeat chest x-ray today, read not available but that area appears resolved on my interpretation.  Chronic cough: Prolonged bronchitis symptoms for several weeks prior to first evaluation.  Chest x-ray relatively clear with the exception of discussion above.  Gradually improving with time.  Multiple rounds of antibiotics without significant improvement.  Likely prolonged asthma exacerbation.  Improved with Breztri.  Discontinue Breztri.  Fevers improved typical worsening of symptoms through the winter.   Return in about 6 months (around 06/04/2023).   Karren Burly, MD 12/05/2022  I spent 40 minutes in care of patient including face-to-face visit, review of records, coordination of care.

## 2022-12-25 ENCOUNTER — Encounter (HOSPITAL_BASED_OUTPATIENT_CLINIC_OR_DEPARTMENT_OTHER): Payer: Self-pay

## 2022-12-25 NOTE — Telephone Encounter (Signed)
Bp log as requested 

## 2023-01-02 ENCOUNTER — Other Ambulatory Visit: Payer: Self-pay | Admitting: Urology

## 2023-01-02 DIAGNOSIS — N401 Enlarged prostate with lower urinary tract symptoms: Secondary | ICD-10-CM

## 2023-01-02 DIAGNOSIS — N3281 Overactive bladder: Secondary | ICD-10-CM

## 2023-01-07 ENCOUNTER — Telehealth: Payer: Self-pay

## 2023-01-07 NOTE — Telephone Encounter (Signed)
[facility] called to verify which medication patient is taking Myrbetriq or Gemtesa? One was filled with mail order this month and one with [facility]. I called patient to verify at first he said he was not sure but knew it was just one of them. At the end of the conversation stated he is taking Gemtesa now and Tamsulosin. No difference in his symptoms noted with Gemtesa vs Myrbetriq and could not say if one was cheaper than another because right now he has met his ut of pocket and all medications are free. Both medications are in patients chart patient would like to know which he should continue taking at this time?  [facility] would like call back to confirm what patient should be taking so they can update their notes. CB [phone] Reference [id number]

## 2023-01-08 NOTE — Telephone Encounter (Signed)
Per previous mychart message sent to patient Myrbetriq was sent in as an alternative in the event the Leslye Peer was cost prohibitive. Mychart message sent to patient again reiterating this information.

## 2023-01-24 ENCOUNTER — Other Ambulatory Visit: Payer: Self-pay | Admitting: Cardiology

## 2023-01-24 DIAGNOSIS — I2510 Atherosclerotic heart disease of native coronary artery without angina pectoris: Secondary | ICD-10-CM

## 2023-02-01 ENCOUNTER — Other Ambulatory Visit: Payer: Self-pay | Admitting: Primary Care

## 2023-03-07 ENCOUNTER — Telehealth: Payer: Self-pay | Admitting: Pulmonary Disease

## 2023-03-07 MED ORDER — PREDNISONE 20 MG PO TABS
ORAL_TABLET | ORAL | 0 refills | Status: AC
Start: 2023-03-07 — End: 2023-03-17

## 2023-03-07 NOTE — Telephone Encounter (Signed)
Left message on VM for patient to call clinic to give information.

## 2023-03-07 NOTE — Telephone Encounter (Signed)
Recommend continue to monitor fever.  I consider true fever under 100.5 Fahrenheit or higher.  If fever worsens or does not improve please let us know.  Suspect viral illness causing bronchitis.  Prednisone taper sent.  If not improving in the next 48 hours recommend urgent care evaluation for chest x-ray consideration of antibiotics.  Historically in the past is not improving antibiotics per my last note.  If he worsens I recommend evaluation at urgent care or emergency room.

## 2023-03-07 NOTE — Telephone Encounter (Signed)
PT not feeling well. Low grade fever since Monday bad cough and congestion. Covid test neg. Pls call to advise. We have tx before w/Antibx, Pred and syrup.   Pharm is [facility] on [address] in [place]  His # is [phone]

## 2023-03-07 NOTE — Telephone Encounter (Signed)
Pt returning missed call. 

## 2023-03-07 NOTE — Telephone Encounter (Signed)
Called and spoke with patient, advised of recommendations per Dr. Judeth Horn.  He verbalized understanding.  Nothing further needed.

## 2023-03-07 NOTE — Telephone Encounter (Signed)
Primary Pulmonologist: Hunsucker Last office visit and with whom: 12/05/2022 Hunsucker What do we see them for (pulmonary problems): Asthma Last OV assessment/plan:   Assessment & Plan:    Right-sided pneumonia: Minimal infiltrate on chest x-ray, looks more like atelectasis to my eye.  Repeat chest x-ray today, read not available but that area appears resolved on my interpretation.   Chronic cough: Prolonged bronchitis symptoms for several weeks prior to first evaluation.  Chest x-ray relatively clear with the exception of discussion above.  Gradually improving with time.  Multiple rounds of antibiotics without significant improvement.  Likely prolonged asthma exacerbation.  Improved with Breztri.  Discontinue Breztri.  Fevers improved typical worsening of symptoms through the winter.     Return in about 6 months (around 06/04/2023).     Karren Burly, MD 12/05/2022   I spent 40 minutes in care of patient including face-to-face visit, review of records, coordination of care.      Patient Instructions by Karren Burly, MD at 12/05/2022 11:30 AM  Author: Karren Burly, MD Author Type: Physician Filed: 12/05/2022 12:01 PM  Note Status: Signed Cosign: Cosign Not Required Encounter Date: 12/05/2022  Editor: Karren Burly, MD (Physician)             Continue Breztri   Chest xray today   Return to clinic in 6 months or sooner as needed       Orthostatic Vitals Recorded in This Encounter   12/05/2022 1124     Patient Position: Sitting  BP Location: Left Arm  Cuff Size: Normal   Instructions   Return in about 6 months (around 06/04/2023). Continue Breztri   Chest xray today   Return to clinic in 6 months or sooner as needed      Was appointment offered to patient (explain)?  None available   Reason for call: Patient states he has not been feeling well since Monday (12/9) He has a cough and is getting up mucous.  On Monday the mucous started out  clear, now it is thick green and ropey.  His Sats are usually 95%, since he has been sick, they have been 91%-92%.  Today sats are 90% . Has had a low grade fever of 100.8 since Tuesday, he is taking Tylenol.  Negative covid test today.  He is using his Breztri inhaler as prescribed.  He has an albuterol inhaler, he has used to make breathing easier.  He does not have a nebulizer.  He has MS and has had pna in the past.  He is requesting an antibiotic, prednisone taper and Phenergan DM cough syrup.  Dr. Judeth Horn, please advise.  Thank you.  (examples of things to ask: : When did symptoms start? Fever? Cough? Productive? Color to sputum? More sputum than usual? Wheezing? Have you needed increased oxygen? Are you taking your respiratory medications? What over the counter measures have you tried?)  No Known Allergies  Immunization History  Administered Date(s) Administered   Influenza, High Dose Seasonal PF 12/25/2019   Influenza,inj,Quad PF,6+ Mos 01/18/2017, 01/21/2018, 01/09/2019   Influenza-Unspecified 12/24/2018   PFIZER(Purple Top)SARS-COV-2 Vaccination 06/15/2019, 07/13/2019   Pneumococcal Conjugate-13 01/21/2018   Pneumococcal Polysaccharide-23 07/23/2013, 01/29/2019   Td 09/23/2009   Zoster Recombinant(Shingrix) 07/24/2016, 12/13/2016

## 2023-03-08 ENCOUNTER — Other Ambulatory Visit: Payer: Self-pay | Admitting: Pulmonary Disease

## 2023-03-11 ENCOUNTER — Ambulatory Visit (INDEPENDENT_AMBULATORY_CARE_PROVIDER_SITE_OTHER): Payer: Medicare Other

## 2023-03-11 ENCOUNTER — Ambulatory Visit
Admission: EM | Admit: 2023-03-11 | Discharge: 2023-03-11 | Disposition: A | Discharge status: Home / Self Care | Payer: Medicare Other | Attending: Emergency Medicine | Admitting: Emergency Medicine

## 2023-03-11 ENCOUNTER — Other Ambulatory Visit: Payer: Medicare Other

## 2023-03-11 ENCOUNTER — Telehealth: Payer: Self-pay | Admitting: Emergency Medicine

## 2023-03-11 DIAGNOSIS — J069 Acute upper respiratory infection, unspecified: Secondary | ICD-10-CM

## 2023-03-11 MED ORDER — PROMETHAZINE-DM 6.25-15 MG/5ML PO SYRP: 5.0000 mL | ORAL_SOLUTION | Freq: Four times a day (QID) | ORAL | 0 refills | Status: DC | PRN

## 2023-03-11 MED ORDER — AZITHROMYCIN 250 MG PO TABS: 250.0000 mg | ORAL_TABLET | Freq: Every day | ORAL | 0 refills | Status: DC

## 2023-03-11 MED ORDER — AMOXICILLIN-POT CLAVULANATE 875-125 MG PO TABS: 1.0000 | ORAL_TABLET | Freq: Two times a day (BID) | ORAL | 0 refills | Status: DC

## 2023-03-11 NOTE — ED Triage Notes (Signed)
Patient to Urgent Care with complaints of low grade fevers/ drainage/ productive cough (green, thick).  Symptoms started 8 days ago. Taking prednisone prescribed via pcp.

## 2023-03-11 NOTE — Telephone Encounter (Signed)
Needs appt

## 2023-03-11 NOTE — Discharge Instructions (Addendum)
Chest x-ray is pending, you will be notified of test results via telephone and treatment plan discussed with you at this time as I will need the results to make further decisions\  You may continue prednisone as directed by your pulmonologist  Use Promethazine DM cough syrup every 6 hours as needed, be mindful this can make you feel sleepy   For cough: honey 1/2 to 1 teaspoon (you can dilute the honey in water or another fluid).  You can also use guaifenesin and dextromethorphan for cough. You can use a humidifier for chest congestion and cough.  If you don't have a humidifier, you can sit in the bathroom with the hot shower running.      For sore throat: try warm salt water gargles, cepacol lozenges, throat spray, warm tea or water with lemon/honey, popsicles or ice, or OTC cold relief medicine for throat discomfort.   For congestion: take a daily anti-histamine like Zyrtec, Claritin, and a oral decongestant, such as pseudoephedrine.  You can also use Flonase 1-2 sprays in each nostril daily.   It is important to stay hydrated: drink plenty of fluids (water, gatorade/powerade/pedialyte, juices, or teas) to keep your throat moisturized and help further relieve irritation/discomfort.

## 2023-03-11 NOTE — Telephone Encounter (Signed)
Chest x-ray results showing right sided pneumonia reported to patient via telephone, 2 patient identifiers used, due to medical history placed on 2 antibiotics, Augmentin and azithromycin sent to pharmacy, has already sent cough medicine to be used additionally with follow-up with pulmonologist or PCP for reevaluation as needed

## 2023-03-12 ENCOUNTER — Ambulatory Visit: Payer: Self-pay

## 2023-03-12 NOTE — ED Provider Notes (Signed)
Allen Tapia    CSN: [id number] Arrival date & time: 03/11/23  1534      History   Chief Complaint Chief Complaint  Patient presents with   Cough   Fever    HPI Allen Tapia is a 67 y.o. male.   Duration of low-grade fevers, nasal congestion, rhinorrhea, productive cough with thick and green sputum present for 8 days.  Discussed with pulmonology via telephone and started on prednisone, 5 days out of 10 but no improvement seen in symptoms therefore he is seeking emergent evaluation.  History of asthma.  Past Medical History:  Diagnosis Date   Anginal pain (HCC)    atypical chest pain cardiac cath performed at that time   Arthritis    Asthma    Atypical chest pain    Barrett's esophagus    Bilateral cataracts    BPH (benign prostatic hyperplasia)    Chicken pox    Chronic constipation    Coronary artery disease 07/14/2007   a.) LHC 07/14/2007: 40% pRCA-1, 40% pRCA-2, 40% dRCA, 20% RPL1, 30% LM, 20% mLCx-1, 40% mLCx-2, 20% OM3-1, 20% OM3-2, 20% pLAD, 40% mLAD-1, 70% mLAD-2, 40% mLAD-3, 30% dLAD, 40% D1, 40% D2 --> med mgmt.   Depression    Foreign body (FB) in soft tissue    Former tobacco use    GERD (gastroesophageal reflux disease)    History of 2019 novel coronavirus disease (COVID-19) 04/07/2019   a.) tested (+) at [facility] 04/16/2019, 05/30/2021; b.) 2 other (+) tests at home (dates unknown)   History of kidney stones    Hyperlipidemia    Hyperplastic colon polyp    Infrarenal abdominal aortic aneurysm (AAA) without rupture (HCC)    a.) MR lumbar spine 12/30/2020: infrarenal saccular AAA measuring 2.2 cm; b.) CTA A/P 01/05/2021: measured 2.6 cm; c.) s/p EVAR 03/03/2021   Ischemic heart disease 12/20/2010   Long term current use of immunosuppressive drug    a.) on ofatumumab for MS   MS (multiple sclerosis) (HCC)    a.) on ofatumumab   Shingles    Tubular adenoma of colon 08/23/2016    Patient Active Problem List   Diagnosis Date Noted    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/08/2022   Left anterior cruciate ligament tear 04/13/2022   Primary osteoarthritis of left knee 04/13/2022   Prediabetes 09/19/2021   AAA (abdominal aortic aneurysm) (HCC) 03/03/2021   AAA (abdominal aortic aneurysm) without rupture (HCC) 01/06/2021   Status post total right knee replacement 10/05/2020   Mild intermittent asthma 12/07/2019   Pure hypercholesterolemia 05/27/2019   Status post laser cataract surgery, left 02/11/2019   Barrett's esophagus 10/10/2016   Chronic constipation 10/10/2016   Ankylosis of right elbow joint 04/11/2015   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   Encounter for long-term (current) use of other medications 06/08/2013   Dyspnea, unspecified 04/07/2012   Multiple sclerosis (HCC) 01/31/2012   Acid reflux 12/20/2010   CAD (coronary artery disease) 12/20/2010   Depression 12/20/2010   Family history of heart disease 12/20/2010   Foreign body 12/20/2010   Hyperlipemia 12/20/2010   Tobacco abuse 12/20/2010    Past Surgical History:  Procedure Laterality Date   ABDOMINAL AORTIC ENDOVASCULAR STENT GRAFT N/A 03/03/2021   Procedure: ABDOMINAL AORTIC ENDOVASCULAR STENT GRAFT;  Surgeon: Maeola Harman, MD;  Location: [facility] OR;  Service: Vascular;  Laterality: N/A;   ADENOIDECTOMY  1960   APPENDECTOMY  1979   CATARACT EXTRACTION,  BILATERAL  03/12/2019   COLONOSCOPY  02/05/2008   COLONOSCOPY WITH PROPOFOL N/A 08/23/2016   Procedure: COLONOSCOPY WITH PROPOFOL;  Surgeon: Christena Deem, MD;  Location: [facility] ENDOSCOPY;  Service: Endoscopy;  Laterality: N/A;   COLONOSCOPY WITH PROPOFOL N/A 04/29/2020   Procedure: COLONOSCOPY WITH PROPOFOL;  Surgeon: Regis Bill, MD;  Location: [facility] ENDOSCOPY;  Service: Endoscopy;  Laterality: N/A;   CYSTOSCOPY WITH INSERTION OF UROLIFT N/A 05/20/2020   Procedure: CYSTOSCOPY WITH INSERTION OF UROLIFT;  Surgeon: Sondra Come,  MD;  Location: [facility] ORS;  Service: Urology;  Laterality: N/A;   ESOPHAGOGASTRODUODENOSCOPY (EGD) WITH PROPOFOL N/A 08/23/2016   Procedure: ESOPHAGOGASTRODUODENOSCOPY (EGD) WITH PROPOFOL;  Surgeon: Christena Deem, MD;  Location[facility] ENDOSCOPY;  Service: Endoscopy;  Laterality: N/A;   ESOPHAGOGASTRODUODENOSCOPY (EGD) WITH PROPOFOL N/A 04/29/2020   Procedure: ESOPHAGOGASTRODUODENOSCOPY (EGD) WITH PROPOFOL;  Surgeon: Regis Bill, MD;  Location: [facility] ENDOSCOPY;  Service: Endoscopy;  Laterality: N/A;   ESOPHAGOGASTRODUODENOSCOPY ENDOSCOPY  02/05/2008   EYE SURGERY     eye trauma  1970   piece of metal to eye removed in clinic   JOINT REPLACEMENT     KNEE ARTHROPLASTY Right 10/05/2020   Procedure: COMPUTER ASSISTED TOTAL KNEE ARTHROPLASTY;  Surgeon: Donato Heinz, MD;  Location: [facility] ORS;  Service: Orthopedics;  Laterality: Right;   KNEE ARTHROPLASTY Left 06/08/2022   Procedure: COMPUTER ASSISTED TOTAL KNEE ARTHROPLASTY;  Surgeon: Donato Heinz, MD;  Location: [facility] ORS;  Service: Orthopedics;  Laterality: Left;   KNEE ARTHROSCOPY Left 12/11/2021   Procedure: ARTHROSCOPY KNEE;  Surgeon: Donato Heinz, MD;  Location: [facility] ORS;  Service: Orthopedics;  Laterality: Left;   KNEE ARTHROSCOPY W/ PARTIAL MEDIAL MENISCECTOMY Right 01/02/2011   LEFT HEART CATH AND CORONARY ANGIOGRAPHY Left 07/14/2007   Procedure: LEFT HEART CATH AND CORONARY ANGIOGRAPHY; Location: [facility]; Surgeon: Eugenia Pancoast, MD   SEPTOPLASTY     TONSILLECTOMY  02/05/2008   ULTRASOUND GUIDANCE FOR VASCULAR ACCESS Left 03/03/2021   Procedure: ULTRASOUND GUIDANCE FOR VASCULAR ACCESS, LEFT FEMORAL ARTERY;  Surgeon: Maeola Harman, MD;  Location: [facility] OR;  Service: Vascular;  Laterality: Left;   WRIST FRACTURE SURGERY Right        Home Medications    Prior to Admission medications   Medication Sig Start Date End Date Taking? Authorizing Provider  promethazine-dextromethorphan (PROMETHAZINE-DM) 6.25-15 MG/5ML  syrup Take 5 mLs by mouth 4 (four) times daily as needed for cough. 03/11/23  Yes Montanna Mcbain, Elita Boone, NP  acetaminophen (TYLENOL) 500 MG tablet Take 2 tablets (1,000 mg total) by mouth every 6 (six) hours as needed for mild pain. 06/09/22   Anson Oregon, PA-C  amoxicillin-clavulanate (AUGMENTIN) 875-125 MG tablet Take 1 tablet by mouth every 12 (twelve) hours. 03/11/23   Niralya Ohanian, Elita Boone, NP  amphetamine-dextroamphetamine (ADDERALL XR) 25 MG 24 hr capsule Take 25 mg by mouth daily as needed (focus).    [provider]  aspirin EC 81 MG tablet Take 81 mg by mouth daily. Swallow whole.    [provider]  azithromycin (ZITHROMAX) 250 MG tablet Take 1 tablet (250 mg total) by mouth daily. Take first 2 tablets together, then 1 every day until finished. 03/11/23   Valinda Hoar, NP  baclofen (LIORESAL) 20 MG tablet Take 20 mg by mouth 3 (three) times daily.    [provider]  Budeson-Glycopyrrol-Formoterol (BREZTRI AEROSPHERE) 160-9-4.8 MCG/ACT AERO Inhale 2 puffs into the lungs in the morning and at bedtime. 11/15/22   Hunsucker, Lesia Sago, MD  buPROPion (WELLBUTRIN XL) 150 MG 24 hr tablet Take 150 mg by mouth every morning. 12/16/18   [provider]  celecoxib (CELEBREX) 200 MG capsule Take 200 mg by mouth 2 (two) times daily. 02/26/21   [provider]  gabapentin (NEURONTIN) 300 MG capsule Take 1-2 capsules (300-600 mg total) by mouth 2 (two) times daily. 600 mg in am and 300 mg in afternoon 06/09/22   Anson Oregon, PA-C  mirabegron ER (MYRBETRIQ) 50 MG TB24 tablet Take 1 tablet (50 mg total) by mouth daily. 10/24/22   Sondra Come, MD  montelukast (SINGULAIR) 10 MG tablet TAKE 1 TABLET AT BEDTIME 02/04/23   Hunsucker, Lesia Sago, MD  Multiple Vitamins-Minerals (MULTIVITAMIN WITH MINERALS) tablet Take 1 tablet by mouth daily.    [provider]  Ofatumumab (KESIMPTA) 20 MG/0.4ML SOAJ Inject 1 Dose into the skin every 30 (thirty)  days. 1st day of every month    [provider]  pantoprazole (PROTONIX) 20 MG tablet Take 1 tablet by mouth at bedtime. 09/11/21   [provider]  Polyethyl Glycol-Propyl Glycol (SYSTANE) 0.4-0.3 % SOLN Apply 1 drop to eye 2 (two) times daily.    [provider]  predniSONE (DELTASONE) 20 MG tablet Take 2 tablets (40 mg total) by mouth daily with breakfast for 5 days, THEN 1 tablet (20 mg total) daily with breakfast for 5 days. 03/07/23 03/17/23  Hunsucker, Lesia Sago, MD  rosuvastatin (CRESTOR) 20 MG tablet TAKE 1 TABLET AT BEDTIME 01/24/23   Jodelle Red, MD  Spacer/Aero-Holding Chambers (AEROCHAMBER MV) inhaler Use as instructed 06/11/18   Audie Box L, DO  tamsulosin (FLOMAX) 0.4 MG CAPS capsule TAKE 1 CAPSULE DAILY 01/02/23   Sondra Come, MD  traZODone (DESYREL) 100 MG tablet Take 100 mg by mouth at bedtime.    [provider]  VENTOLIN HFA 108 (90 Base) MCG/ACT inhaler USE 2 INHALATIONS EVERY 6 HOURS AS NEEDED FOR WHEEZING OR SHORTNESS OF BREATH 10/26/22   Glenford Bayley, NP  Vibegron (GEMTESA) 75 MG TABS Take 1 tablet (75 mg total) by mouth daily. 10/24/22   Sondra Come, MD  VITAMIN D PO Take 1 capsule by mouth daily.    [provider]    Family History Family History  Problem Relation Age of Onset   Heart disease Mother    Glaucoma Mother    Irregular heart beat Mother    Cardiomyopathy Mother    Colon polyps Mother    Cataracts Mother    Hypertension Father    Heart disease Father    Cancer Father    Esophageal cancer Cousin     Social History Social History   Tobacco Use   Smoking status: Former    Current packs/day: 0.00    Average packs/day: 1 pack/day for 32.5 years (32.5 ttl pk-yrs)    Types: Cigarettes    Start date: 77    Quit date: 09/23/2006    Years since quitting: 16.4   Smokeless tobacco: Never  Vaping Use   Vaping status: Never Used  Substance Use Topics   Alcohol use: No   Drug  use: No     Allergies   Patient has no known allergies.   Review of Systems Review of Systems  Constitutional:  Positive for fever.  Respiratory:  Positive for cough.      Physical Exam Triage Vital Signs ED Triage Vitals  Encounter Vitals Group     BP      Systolic  BP Percentile      Diastolic BP Percentile      Pulse      Resp      Temp      Temp src      SpO2      Weight      Height      Head Circumference      Peak Flow      Pain Score      Pain Loc      Pain Education      Exclude from Growth Chart    No data found.  Updated Vital Signs There were no vitals taken for this visit.  Visual Acuity Right Eye Distance:   Left Eye Distance:   Bilateral Distance:    Right Eye Near:   Left Eye Near:    Bilateral Near:     Physical Exam Constitutional:      Appearance: Normal appearance.  HENT:     Head: Normocephalic.     Right Ear: Tympanic membrane, ear canal and external ear normal.     Left Ear: Tympanic membrane, ear canal and external ear normal.     Nose: Congestion present. No rhinorrhea.     Mouth/Throat:     Pharynx: Posterior oropharyngeal erythema present. No oropharyngeal exudate.  Eyes:     Extraocular Movements: Extraocular movements intact.  Cardiovascular:     Rate and Rhythm: Normal rate and regular rhythm.     Pulses: Normal pulses.     Heart sounds: Normal heart sounds.  Pulmonary:     Effort: Pulmonary effort is normal.     Breath sounds: Normal breath sounds.  Neurological:     Mental Status: He is alert and oriented to person, place, and time. Mental status is at baseline.      UC Treatments / Results  Labs (all labs ordered are listed, but only abnormal results are displayed) Labs Reviewed - No data to display  EKG   Radiology DG Chest 2 View Result Date: 03/11/2023 CLINICAL DATA:  Cough. EXAM: CHEST - 2 VIEW COMPARISON:  On 11/24 FINDINGS: New right infrahilar density is suspicious for pneumonia. Left lung  remains clear. The cardiopericardial silhouette is within normal limits for size. Tortuosity of the descending thoracic aorta again noted. No acute bony abnormality. IMPRESSION: New right infrahilar density is suspicious for pneumonia. Close follow-up imaging recommended to ensure resolution. Electronically Signed   By: Kennith Center M.D.   On: 03/11/2023 17:14    Procedures Procedures (including critical care time)  Medications Ordered in UC Medications - No data to display  Initial Impression / Assessment and Plan / UC Course  I have reviewed the triage vital signs and the nursing notes.  Pertinent labs & imaging results that were available during my care of the patient were reviewed by me and considered in my medical decision making (see chart for details).  Acute URI  Chest x-ray pending, recommended completion of prednisone as directed, promethazine DM prescribed for coughing, declined Tessalon as he deems ineffective, recommended additional supportive measures will make adjustments to treatment plan based on chest x-ray results Final Clinical Impressions(s) / UC Diagnoses   Final diagnoses:  Acute URI     Discharge Instructions      Chest x-ray is pending, you will be notified of test results via telephone and treatment plan discussed with you at this time as I will need the results to make further decisions\  You may continue prednisone as directed  by your pulmonologist  Use Promethazine DM cough syrup every 6 hours as needed, be mindful this can make you feel sleepy   For cough: honey 1/2 to 1 teaspoon (you can dilute the honey in water or another fluid).  You can also use guaifenesin and dextromethorphan for cough. You can use a humidifier for chest congestion and cough.  If you don't have a humidifier, you can sit in the bathroom with the hot shower running.      For sore throat: try warm salt water gargles, cepacol lozenges, throat spray, warm tea or water with  lemon/honey, popsicles or ice, or OTC cold relief medicine for throat discomfort.   For congestion: take a daily anti-histamine like Zyrtec, Claritin, and a oral decongestant, such as pseudoephedrine.  You can also use Flonase 1-2 sprays in each nostril daily.   It is important to stay hydrated: drink plenty of fluids (water, gatorade/powerade/pedialyte, juices, or teas) to keep your throat moisturized and help further relieve irritation/discomfort.    ED Prescriptions     Medication Sig Dispense Auth. Provider   promethazine-dextromethorphan (PROMETHAZINE-DM) 6.25-15 MG/5ML syrup Take 5 mLs by mouth 4 (four) times daily as needed for cough. 180 mL Valinda Hoar, NP      PDMP not reviewed this encounter.   Valinda Hoar, NP 03/12/23 0830

## 2023-03-13 DIAGNOSIS — N3281 Overactive bladder: Secondary | ICD-10-CM

## 2023-03-13 MED ORDER — GEMTESA 75 MG PO TABS: 75.0000 mg | ORAL_TABLET | Freq: Every day | ORAL | 3 refills | Status: DC

## 2023-04-11 ENCOUNTER — Ambulatory Visit: Payer: Medicare Other | Admitting: Urology

## 2023-04-11 ENCOUNTER — Other Ambulatory Visit: Payer: Self-pay

## 2023-04-11 ENCOUNTER — Ambulatory Visit
Admission: RE | Admit: 2023-04-11 | Discharge: 2023-04-11 | Disposition: A | Discharge status: Home / Self Care | Payer: Medicare Other | Source: Ambulatory Visit | Attending: Urology | Admitting: Urology

## 2023-04-11 ENCOUNTER — Ambulatory Visit
Admission: RE | Admit: 2023-04-11 | Discharge: 2023-04-11 | Disposition: A | Discharge status: Home / Self Care | Payer: Medicare Other | Attending: Urology | Admitting: Urology

## 2023-04-11 DIAGNOSIS — N200 Calculus of kidney: Secondary | ICD-10-CM

## 2023-04-11 DIAGNOSIS — N3281 Overactive bladder: Secondary | ICD-10-CM | POA: Diagnosis not present

## 2023-04-11 DIAGNOSIS — N401 Enlarged prostate with lower urinary tract symptoms: Secondary | ICD-10-CM

## 2023-04-11 DIAGNOSIS — N138 Other obstructive and reflux uropathy: Secondary | ICD-10-CM

## 2023-04-11 DIAGNOSIS — Z9889 Other specified postprocedural states: Secondary | ICD-10-CM

## 2023-04-11 MED ORDER — TAMSULOSIN HCL 0.4 MG PO CAPS: 0.4000 mg | ORAL_CAPSULE | Freq: Every day | ORAL | 3 refills | Status: DC

## 2023-04-11 MED ORDER — GEMTESA 75 MG PO TABS: 75.0000 mg | ORAL_TABLET | Freq: Every day | ORAL | 3 refills | Status: DC

## 2023-04-11 NOTE — Progress Notes (Signed)
   04/11/2023 10:10 AM   Allen Tapia [birth year] [id number]  Reason for visit: Follow up OAB, BPH status post UroLift, nephrolithiasis, PSA screening  HPI: 68 year old male with long-term well-controlled multiple sclerosis as well as a long history of urinary symptoms.  He has a history of urodynamics showing an obstructed flow pattern with some unstable contractions, and mildly elevated PVRs ranging around 200 mL in the past.  His prostate measured 33 g with lateral lobe hypertrophy. He previously had had some mild improvement on Flomax. After extensive counseling, he opted to undergo a UroLift, and this was performed on 05/20/2020.     Overall, he feels his urination has improved significantly since surgery.  PVR is normal today at 70ml.  When we had originally met he was voiding 25-30 times per day, now after UroLift is down to <10 times per day.  Has rare urge incontinence.  He continues to take both Flomax and Gemtesa, as he feels these both improve his urinary symptoms.  He has alternated between [place] and Myrbetriq previously based on cost, and he feels both work, but Leslye Peer is the most helpful.  He also has a history of an incidental 1 cm nonobstructing right lower pole stone seen on CT January 2023 and opted for surveillance alone.  I personally viewed and interpreted the KUB today that shows a stable 1 cm right sided stone, can continue surveillance every other year.  PSA has been normal, most recently 0.43 from December 2023.  Unlikely to need further screening past age 30 per the guideline recommendations.  -Flomax refilled -Gemtesa refilled, can replace with Myrbetriq 50mg  at any point per patient preference based on cost -RTC 1 year PVR, will get KUB every other year  Sondra Come, MD  [facility] [address] [place], [place] [address] [phone]

## 2023-04-11 NOTE — Addendum Note (Signed)
Addended by: Frankey Shown on: 04/11/2023 10:32 AM   Modules accepted: Orders

## 2023-04-17 ENCOUNTER — Ambulatory Visit (INDEPENDENT_AMBULATORY_CARE_PROVIDER_SITE_OTHER)
Admission: RE | Admit: 2023-04-17 | Discharge: 2023-04-17 | Disposition: A | Discharge status: Home / Self Care | Payer: Medicare Other | Source: Ambulatory Visit | Attending: Vascular Surgery | Admitting: Vascular Surgery

## 2023-04-17 ENCOUNTER — Encounter: Payer: Self-pay | Admitting: Vascular Surgery

## 2023-04-17 ENCOUNTER — Ambulatory Visit: Payer: Medicare Other | Admitting: Vascular Surgery

## 2023-04-17 ENCOUNTER — Ambulatory Visit (HOSPITAL_COMMUNITY)
Admission: RE | Admit: 2023-04-17 | Discharge: 2023-04-17 | Disposition: A | Discharge status: Home / Self Care | Payer: Medicare Other | Source: Ambulatory Visit | Attending: Vascular Surgery | Admitting: Vascular Surgery

## 2023-04-17 VITALS — BP 120/64 | HR 58 | Temp 98.0°F | Resp 98 | Ht 72.0 in | Wt 205.0 lb

## 2023-04-17 DIAGNOSIS — Z9889 Other specified postprocedural states: Secondary | ICD-10-CM | POA: Diagnosis not present

## 2023-04-17 NOTE — Progress Notes (Signed)
Patient ID: Allen Tapia, male   DOB: [birth year], 68 y.o.   MRN: [id number]  Reason for Consult: Follow-up   Referred by Kandyce Rud, MD  Subjective:     HPI:  Allen Tapia is a 68 y.o. 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  He is now here for 1 year follow-up and he also has a known small splenic artery aneurysm as well as enlarged popliteal arteries on exam here today with lower extremity arterial duplexes as well.  Patient states that his blood pressure has been elevated most likely secondary to working on the stressors of working on his house.   Past Medical History:  Diagnosis Date   Anginal pain (HCC)    atypical chest pain cardiac cath performed at that time   Arthritis    Asthma    Atypical chest pain    Barrett's esophagus    Bilateral cataracts    BPH (benign prostatic hyperplasia)    Chicken pox    Chronic constipation    Coronary artery disease 07/14/2007   a.) LHC 07/14/2007: 40% pRCA-1, 40% pRCA-2, 40% dRCA, 20% RPL1, 30% LM, 20% mLCx-1, 40% mLCx-2, 20% OM3-1, 20% OM3-2, 20% pLAD, 40% mLAD-1, 70% mLAD-2, 40% mLAD-3, 30% dLAD, 40% D1, 40% D2 --> med mgmt.   Depression    Foreign body (FB) in soft tissue    Former tobacco use    GERD (gastroesophageal reflux disease)    History of 2019 novel coronavirus disease (COVID-19) 04/07/2019   a.) tested (+) at [facility] 04/16/2019, 05/30/2021; b.) 2 other (+) tests at home (dates unknown)   History of kidney stones    Hyperlipidemia    Hyperplastic colon polyp    Infrarenal abdominal aortic aneurysm (AAA) without rupture (HCC)    a.) MR lumbar spine 12/30/2020: infrarenal saccular AAA measuring 2.2 cm; b.) CTA A/P 01/05/2021: measured 2.6 cm; c.) s/p EVAR 03/03/2021   Ischemic heart disease 12/20/2010   Long term current use of immunosuppressive drug    a.) on ofatumumab for MS   MS  (multiple sclerosis) (HCC)    a.) on ofatumumab   Shingles    Tubular adenoma of colon 08/23/2016   Family History  Problem Relation Age of Onset   Heart disease Mother    Glaucoma Mother    Irregular heart beat Mother    Cardiomyopathy Mother    Colon polyps Mother    Cataracts Mother    Hypertension Father    Heart disease Father    Cancer Father    Esophageal cancer Cousin    Past Surgical History:  Procedure Laterality Date   ABDOMINAL AORTIC ENDOVASCULAR STENT GRAFT N/A 03/03/2021   Procedure: ABDOMINAL AORTIC ENDOVASCULAR STENT GRAFT;  Surgeon: Maeola Harman, MD;  Location: [facility] OR;  Service: Vascular;  Laterality: N/A;   ADENOIDECTOMY  1960   APPENDECTOMY  1979   CATARACT EXTRACTION, BILATERAL  03/12/2019   COLONOSCOPY  02/05/2008   COLONOSCOPY WITH PROPOFOL N/A 08/23/2016   Procedure: COLONOSCOPY WITH PROPOFOL;  Surgeon: Christena Deem, MD;  Location: [facility] ENDOSCOPY;  Service: Endoscopy;  Laterality: N/A;   COLONOSCOPY WITH PROPOFOL N/A 04/29/2020   Procedure: COLONOSCOPY WITH PROPOFOL;  Surgeon: Regis Bill, MD;  Location: [facility] ENDOSCOPY;  Service: Endoscopy;  Laterality: N/A;   CYSTOSCOPY WITH INSERTION OF UROLIFT N/A 05/20/2020   Procedure: CYSTOSCOPY WITH INSERTION OF UROLIFT;  Surgeon: Sondra Come, MD;  Location: [facility] ORS;  Service: Urology;  Laterality: N/A;   ESOPHAGOGASTRODUODENOSCOPY (EGD) WITH PROPOFOL N/A 08/23/2016   Procedure: ESOPHAGOGASTRODUODENOSCOPY (EGD) WITH PROPOFOL;  Surgeon: Christena Deem, MD;  Location: [facility] ENDOSCOPY;  Service: Endoscopy;  Laterality: N/A;   ESOPHAGOGASTRODUODENOSCOPY (EGD) WITH PROPOFOL N/A 04/29/2020   Procedure: ESOPHAGOGASTRODUODENOSCOPY (EGD) WITH PROPOFOL;  Surgeon: Regis Bill, MD;  Location: [facility] ENDOSCOPY;  Service: Endoscopy;  Laterality: N/A;   ESOPHAGOGASTRODUODENOSCOPY ENDOSCOPY  02/05/2008   EYE SURGERY     eye trauma  1970   piece of metal to eye removed in clinic   JOINT  REPLACEMENT     KNEE ARTHROPLASTY Right 10/05/2020   Procedure: COMPUTER ASSISTED TOTAL KNEE ARTHROPLASTY;  Surgeon: Donato Heinz, MD;  Location: [facility] ORS;  Service: Orthopedics;  Laterality: Right;   KNEE ARTHROPLASTY Left 06/08/2022   Procedure: COMPUTER ASSISTED TOTAL KNEE ARTHROPLASTY;  Surgeon: Donato Heinz, MD;  Location: [facility] ORS;  Service: Orthopedics;  Laterality: Left;   KNEE ARTHROSCOPY Left 12/11/2021   Procedure: ARTHROSCOPY KNEE;  Surgeon: Donato Heinz, MD;  Location: [facility] ORS;  Service: Orthopedics;  Laterality: Left;   KNEE ARTHROSCOPY W/ PARTIAL MEDIAL MENISCECTOMY Right 01/02/2011   LEFT HEART CATH AND CORONARY ANGIOGRAPHY Left 07/14/2007   Procedure: LEFT HEART CATH AND CORONARY ANGIOGRAPHY; Location: [facility]; Surgeon: Eugenia Pancoast, MD   SEPTOPLASTY     TONSILLECTOMY  02/05/2008   ULTRASOUND GUIDANCE FOR VASCULAR ACCESS Left 03/03/2021   Procedure: ULTRASOUND GUIDANCE FOR VASCULAR ACCESS, LEFT FEMORAL ARTERY;  Surgeon: Maeola Harman, MD;  Location: [facility] OR;  Service: Vascular;  Laterality: Left;   WRIST FRACTURE SURGERY Right     Short Social History:  Social History   Tobacco Use   Smoking status: Former    Current packs/day: 0.00    Average packs/day: 1 pack/day for 32.5 years (32.5 ttl pk-yrs)    Types: Cigarettes    Start date: 65    Quit date: 09/23/2006    Years since quitting: 16.5   Smokeless tobacco: Never  Substance Use Topics   Alcohol use: No    No Known Allergies  Current Outpatient Medications  Medication Sig Dispense Refill   acetaminophen (TYLENOL) 500 MG tablet Take 2 tablets (1,000 mg total) by mouth every 6 (six) hours as needed for mild pain. 60 tablet 0   amphetamine-dextroamphetamine (ADDERALL XR) 25 MG 24 hr capsule Take 25 mg by mouth daily as needed (focus).     aspirin EC 81 MG tablet Take 81 mg by mouth daily. Swallow whole.     baclofen (LIORESAL) 20 MG tablet Take 20 mg by mouth 3 (three) times daily.      Budeson-Glycopyrrol-Formoterol (BREZTRI AEROSPHERE) 160-9-4.8 MCG/ACT AERO Inhale 2 puffs into the lungs in the morning and at bedtime. 32.1 g 1   buPROPion (WELLBUTRIN XL) 150 MG 24 hr tablet Take 150 mg by mouth every morning.     celecoxib (CELEBREX) 200 MG capsule Take 200 mg by mouth 2 (two) times daily.     clobetasol ointment (TEMOVATE) 0.05 % SMARTSIG:sparingly Topical Twice Daily     gabapentin (NEURONTIN) 300 MG capsule Take 1-2 capsules (300-600 mg total) by mouth 2 (two) times daily. 600 mg in am and 300 mg in afternoon (Patient taking differently: Take 600 mg by mouth 2 (two) times daily.) 60 capsule 0   mirabegron ER (MYRBETRIQ) 50 MG TB24 tablet Take 1 tablet (50 mg total) by mouth daily. 90 tablet 3  montelukast (SINGULAIR) 10 MG tablet TAKE 1 TABLET AT BEDTIME 90 tablet 3   Multiple Vitamins-Minerals (MULTIVITAMIN WITH MINERALS) tablet Take 1 tablet by mouth daily.     Ofatumumab (KESIMPTA) 20 MG/0.4ML SOAJ Inject 1 Dose into the skin every 30 (thirty) days. 1st day of every month     pantoprazole (PROTONIX) 20 MG tablet Take 1 tablet by mouth at bedtime.     Polyethyl Glycol-Propyl Glycol (SYSTANE) 0.4-0.3 % SOLN Apply 1 drop to eye 2 (two) times daily.     promethazine-dextromethorphan (PROMETHAZINE-DM) 6.25-15 MG/5ML syrup Take 5 mLs by mouth 4 (four) times daily as needed for cough. 180 mL 0   rosuvastatin (CRESTOR) 20 MG tablet TAKE 1 TABLET AT BEDTIME 90 tablet 3   Spacer/Aero-Holding Chambers (AEROCHAMBER MV) inhaler Use as instructed 1 each 0   tamsulosin (FLOMAX) 0.4 MG CAPS capsule Take 1 capsule (0.4 mg total) by mouth daily. 90 capsule 3   traZODone (DESYREL) 100 MG tablet Take 100 mg by mouth at bedtime.     VENTOLIN HFA 108 (90 Base) MCG/ACT inhaler USE 2 INHALATIONS EVERY 6 HOURS AS NEEDED FOR WHEEZING OR SHORTNESS OF BREATH 54 g 3   Vibegron (GEMTESA) 75 MG TABS Take 1 tablet (75 mg total) by mouth daily. 90 tablet 3   VITAMIN D PO Take 1 capsule by mouth daily.      No current facility-administered medications for this visit.    Review of Systems  Constitutional:  Constitutional negative. HENT: HENT negative.  Eyes: Eyes negative.  Respiratory: Respiratory negative.  Cardiovascular: Cardiovascular negative.  GI: Gastrointestinal negative.  Musculoskeletal: Musculoskeletal negative.  Skin: Skin negative.  Neurological: Neurological negative. Hematologic: Hematologic/lymphatic negative.  Psychiatric: Psychiatric negative.        Objective:  Objective   Vitals:   04/17/23 1034  BP: 120/64  Pulse: (!) 58  Resp: (!) 98  Temp: 98 F (36.7 C)  SpO2: 93%  Weight: 205 lb (93 kg)  Height: 6' (1.829 m)   Body mass index is 27.8 kg/m.  Physical Exam HENT:     Head: Normocephalic.     Nose: Nose normal.  Eyes:     Pupils: Pupils are equal, round, and reactive to light.  Cardiovascular:     Rate and Rhythm: Normal rate.     Pulses:          Popliteal pulses are 3+ on the right side and 3+ on the left side.  Pulmonary:     Effort: Pulmonary effort is normal.  Abdominal:     General: Abdomen is flat.     Palpations: Abdomen is soft.  Musculoskeletal:        General: Normal range of motion.     Right lower leg: No edema.     Left lower leg: No edema.  Skin:    General: Skin is warm.     Capillary Refill: Capillary refill takes less than 2 seconds.  Neurological:     General: No focal deficit present.     Mental Status: He is alert.  Psychiatric:        Mood and Affect: Mood normal.        Behavior: Behavior normal.        Thought Content: Thought content normal.        Judgment: Judgment normal.     Data: Endovascular Aortic Repair (EVAR):  +----------+----------------+-------------------+-------------------+           Diameter AP (cm)Diameter Trans (cm)Velocities (cm/sec)  +----------+----------------+-------------------+-------------------+  Aorta  1.86            1.78               61                    +----------+----------------+-------------------+-------------------+  Right Limb1.10            1.34               99                   +----------+----------------+-------------------+-------------------+  Left Limb 1.36            1.43               62                   +----------+----------------+-------------------+-------------------+   +-------------+---------------+  Endoleak TypeNone visualized  +-------------+---------------+       Summary:  Abdominal Aorta: The largest aortic diameter remains essentially unchanged  compared to prior exam. Previous diameter measurement was 1.7 cm obtained  on 04/11/2022.   NOTE: Proximal aorta measures approximately 2.97 cm   +---------------+-------+-----------+---------+--------+-----+--------+  Right PoplitealAP (cm)Transv (cm)Waveform StenosisShapeComments  +---------------+-------+-----------+---------+--------+-----+--------+  Proximal      0.85   1.05                                       +---------------+-------+-----------+---------+--------+-----+--------+  Mid           0.93   1.01       triphasic                       +---------------+-------+-----------+---------+--------+-----+--------+  Distal        0.75   0.82       triphasic                       +---------------+-------+-----------+---------+--------+-----+--------+   +--------------+-------+-----------+---------+--------+-----+--------+  Left PoplitealAP (cm)Transv (cm)Waveform StenosisShapeComments  +--------------+-------+-----------+---------+--------+-----+--------+  Proximal     0.91   0.87       triphasic                       +--------------+-------+-----------+---------+--------+-----+--------+  Mid          0.90   0.87       triphasic                       +--------------+-------+-----------+---------+--------+-----+--------+  Distal       0.74   0.74       triphasic                        +--------------+-------+-----------+---------+--------+-----+--------+     Summary:  No popliteal aneurysm bilaterally.       Assessment/Plan:    68 year old male status post endovascular to graft saccular aortic which appears to be stable in size.  He also has a small saccular aneurysm of the splenic artery which was approximately 1 cm 2 years ago but would be difficult to visualize with duplex.  Popliteal arteries appear normal in size today although they are strongly palpable by physical exam.  Will follow-up in 1 year with CT angio to evaluate endovascular to graft repair as well as saccular aneurysm of the splenic artery.  We discussed the signs and symptoms of rupture although  very low likelihood of his splenic artery aneurysm.  He demonstrates good understanding in 1 year and we can consider duplex in 2 years if the endovascular aneurysm repair appears stable.     Maeola Harman MD [facility]

## 2023-04-26 ENCOUNTER — Other Ambulatory Visit: Payer: Self-pay | Admitting: Pulmonary Disease

## 2023-05-07 ENCOUNTER — Telehealth: Payer: Self-pay | Admitting: Cardiology

## 2023-05-07 NOTE — Telephone Encounter (Signed)
Called patient. Patient states he normally has BP ranging between 110-120/70s. Lately he has been having readings from 130-145/80-94 Years ago, he had atypical CP. Had heart cath in 2009 - 70% RCA Patient was bale to move lawnmower before but lately has not been active, possibly due to winter months. Patient has c/o R sternum pain that sometimes is "broad under the sternum" and goes to his L sternum and on a rare occasion pain goes to his L arm but is instant and goes away as soon as it happens. He has arrhthymias "on the low side" but only notices while at rest. BP yesterday (estimate) 138/88. No active CP at this moment. No other BP readings available. Patient states it could musculoskeletal pain but wanted [place] to know.  Patient aware that info will be sent to APP and that Dr. Salena Saner is not in office. He verbalized understanding.

## 2023-05-07 NOTE — Telephone Encounter (Signed)
Pt c/o of Chest Pain: STAT if CP now or developed within 24 hours  1. Are you having CP right now?  No   2. Are you experiencing any other symptoms (ex. SOB, nausea, vomiting, sweating)?  Elevated BP around 135-150 systolic and 80-90 diastolic, normal parameters are around 110-115 systolic  3. How long have you been experiencing CP?  Past few weeks, possibly longer  4. Is your CP continuous or coming and going?  Coming and going, patient assumes it may be musculoskeletal  5. Have you taken Nitroglycerin?  No, patient states he has never taken nitroglycerin ?

## 2023-05-07 NOTE — Telephone Encounter (Signed)
Called and spoke to patient. Patient scheduled to see APP on 2/24 @ 8:25 am to f/u on symptoms. Patient advised to go to ED if CP is frequent and becomes worse. He verbalized understanding.

## 2023-05-07 NOTE — Telephone Encounter (Signed)
He does have history of musculoskeletal chest pain. Given elevated BP and recurrence of symptoms, recommend office visit for evaluation. If he has significant chest pain prior to clinic visit, would need evaluation in ED.    Alver Sorrow, NP

## 2023-05-16 ENCOUNTER — Encounter (HOSPITAL_BASED_OUTPATIENT_CLINIC_OR_DEPARTMENT_OTHER): Payer: Self-pay | Admitting: Cardiology

## 2023-05-16 NOTE — Telephone Encounter (Signed)
error 

## 2023-05-20 ENCOUNTER — Encounter (HOSPITAL_BASED_OUTPATIENT_CLINIC_OR_DEPARTMENT_OTHER): Payer: Self-pay | Admitting: Family

## 2023-05-20 ENCOUNTER — Ambulatory Visit (INDEPENDENT_AMBULATORY_CARE_PROVIDER_SITE_OTHER): Payer: Medicare Other | Admitting: Family

## 2023-05-20 VITALS — BP 132/78 | HR 62 | Ht 72.0 in | Wt 210.8 lb

## 2023-05-20 DIAGNOSIS — E785 Hyperlipidemia, unspecified: Secondary | ICD-10-CM | POA: Diagnosis not present

## 2023-05-20 DIAGNOSIS — I25118 Atherosclerotic heart disease of native coronary artery with other forms of angina pectoris: Secondary | ICD-10-CM

## 2023-05-20 DIAGNOSIS — R079 Chest pain, unspecified: Secondary | ICD-10-CM | POA: Diagnosis not present

## 2023-05-20 DIAGNOSIS — R030 Elevated blood-pressure reading, without diagnosis of hypertension: Secondary | ICD-10-CM | POA: Diagnosis not present

## 2023-05-20 MED ORDER — AMLODIPINE BESYLATE 5 MG PO TABS: 5.0000 mg | ORAL_TABLET | Freq: Every day | ORAL | 3 refills | Status: DC

## 2023-05-20 NOTE — Patient Instructions (Signed)
 Medication Instructions:  Your physician has recommended you make the following change in your medication:   Start: Amlodipine 5mg  daily   Testing/Procedures: IF YOUR SYMPTOMS PROGRESS BEFORE YOUR TEST PLEASE LET us KNOW     Please report to Radiology at the [facility] Main Entrance 30 minutes early for your test.  [address] [place], [place] [address]                         OR   Please report to Radiology at [facility] Main Entrance, medical mall, 30 mins prior to your test.  [address]  [place], [place]  How to Prepare for Your Cardiac PET/CT Stress Test:  Nothing to eat or drink, except water, 3 hours prior to arrival time.  NO caffeine/decaffeinated products, or chocolate 12 hours prior to arrival. (Please note decaffeinated beverages (teas/coffees) still contain caffeine).  If you have caffeine within 12 hours prior, the test will need to be rescheduled.  Medication instructions: Do not take erectile dysfunction medications for 72 hours prior to test (sildenafil, tadalafil) Do not take nitrates (isosorbide mononitrate, Ranexa) the day before or day of test Do not take tamsulosin the day before or morning of test Hold theophylline containing medications for 12 hours. Hold Dipyridamole 48 hours prior to the test.  Diabetic Preparation: If able to eat breakfast prior to 3 hour fasting, you may take all medications, including your insulin. Do not worry if you miss your breakfast dose of insulin - start at your next meal. If you do not eat prior to 3 hour fast-Hold all diabetes (oral and insulin) medications. Patients who wear a continuous glucose monitor MUST remove the device prior to scanning.  You may take your remaining medications with water.  NO perfume, cologne or lotion on chest or abdomen area. FEMALES - Please avoid wearing dresses to this appointment.  Total time is 1 to 2 hours; you may want to bring reading  material for the waiting time.  IF YOU THINK YOU MAY BE PREGNANT, OR ARE NURSING PLEASE INFORM THE TECHNOLOGIST.  In preparation for your appointment, medication and supplies will be purchased.  Appointment availability is limited, so if you need to cancel or reschedule, please call the Radiology Department Scheduler at [phone] 24 hours in advance to avoid a cancellation fee of $100.00  What to Expect When you Arrive:  Once you arrive and check in for your appointment, you will be taken to a preparation room within the Radiology Department.  A technologist or Nurse will obtain your medical history, verify that you are correctly prepped for the exam, and explain the procedure.  Afterwards, an IV will be started in your arm and electrodes will be placed on your skin for EKG monitoring during the stress portion of the exam. Then you will be escorted to the PET/CT scanner.  There, staff will get you positioned on the scanner and obtain a blood pressure and EKG.  During the exam, you will continue to be connected to the EKG and blood pressure machines.  A small, safe amount of a radioactive tracer will be injected in your IV to obtain a series of pictures of your heart along with an injection of a stress agent.    After your Exam:  It is recommended that you eat a meal and drink a caffeinated beverage to counter act any effects of the stress agent.  Drink plenty of fluids  for the remainder of the day and urinate frequently for the first couple of hours after the exam.  Your doctor will inform you of your test results within 7-10 business days.  For more information and frequently asked questions, please visit our website: [email or web address]  For questions about your test or how to prepare for your test, please call: Cardiac Imaging Nurse Navigators Office: [phone]    Follow-Up: At [facility], you and your health needs are our priority.  As part of our  continuing mission to provide you with exceptional heart care, we have created designated Provider Care Teams.  These Care Teams include your primary Cardiologist (physician) and Advanced Practice Providers (APPs -  Physician Assistants and Nurse Practitioners) who all work together to provide you with the care you need, when you need it.  We recommend signing up for the patient portal called "MyChart".  Sign up information is provided on this After Visit Summary.  MyChart is used to connect with patients for Virtual Visits (Telemedicine).  Patients are able to view lab/test results, encounter notes, upcoming appointments, etc.  Non-urgent messages can be sent to your provider as well.   To learn more about what you can do with MyChart, go to [email or web address].    Your next appointment:   2 MONTHS WITH DR. Cristal Deer, Gillian Shields, NP OR Eligha Bridegroom, NP

## 2023-05-20 NOTE — Progress Notes (Signed)
 Cardiology Office Note:  .   Date:  05/20/2023  ID:  TRAY KLAYMAN, DOB Jun 29, 1955, MRN [id number] PCP: Kandyce Rud, MD  [facility] Providers Cardiologist:  Jodelle Red, MD    History of Present Illness: .   Allen Tapia is a 68 y.o. male with hx of CAD, MS, squamos cell skin cancer, HLD, prior tobacco use, endovascular to graft repair of saccular aneurysm of infrarenal abdominal aorta 02/2021.  Prior LHC at [facility] 06/2007 obstructive LAD only, not intervened on as no matching ischemia on stress testing/atypical symptoms, LVEF 65%. Other vessels with nonobstructive disease.  Last seen 10/24/22 by Dr. Cristal Deer doing well from cardiac perspective. Lp(a) 64.  Presents today for chest pain and elevated BP 135-150/80-90 at home.He has not had his BP cuff checked in office. He has experienced different atypical chest pain since previous visit. He says the pain is at rest in his chair and can be when up and moving around at times. It is a midsternal to left sided discomfort with occasional radiation down his left arm. He does have infrequent palpitations when resting. He does have chronic musculoskeletal pain d/t MS notes present pain feels different than prior musculoskeletal pain. He is frustrated that he has not been as mobile and gained some weight, but ultimately attributes this to the season. Concerned regarding family history as his father and brother had CABG in the past. Last cardiac imaging was in 2009. Reports no shortness of breath nor dyspnea on exertion.  No edema, orthopnea, PND.   ROS: Please see the history of present illness.    All other systems reviewed and are negative.   Studies Reviewed: .         Risk Assessment/Calculations:             Physical Exam:   VS:  BP 132/78   Pulse 62   Ht 6' (1.829 m)   Wt 210 lb 12.8 oz (95.6 kg)   SpO2 94%   BMI 28.59 kg/m    Wt Readings from Last 3 Encounters:  05/20/23 210 lb 12.8 oz (95.6 kg)   04/17/23 205 lb (93 kg)  04/11/23 198 lb (89.8 kg)    GEN: Well nourished, well developed in no acute distress NECK: No JVD; No carotid bruits CARDIAC: RRR, no murmurs, rubs, gallops RESPIRATORY:  Clear to auscultation without rales, wheezing or rhonchi  ABDOMEN: Soft, non-tender, non-distended EXTREMITIES:  No edema; No deformity   ASSESSMENT AND PLAN: .    Elevated BP without diagnosis of hypertension - BP at home averaging 140's/70's. He will bring cuff at his next visit to compare. He is agreeable to start amlodipine 5 mg once daily. Discussed to monitor BP at home at least 2 hours after medications and sitting for 5-10 minutes. Will send MyChart message in 2 weeks to follow BP.   Chest pain / CAD - Prior LHC 2009 with 70% mLAD stenosis treated medically due to atypical symptoms and normal LVEF. Otherwise nonobstructive disease. Has had chest pain that is mid sternal at rest or with activity lasting 10 min and rarely radiating to arm. Feels different than his prior musculoskeletal pain. He has become less active due to winter months. Strong family history with father and brother having CABG. EKG today unremarkable. Due to family history and chest pain (though with mixed typical and atypical features) plan for cardiac PET. Prefer utilization of PET for assessment of coronary arteries as well as LVEF to determine necessity of intervention.  He will reach out if symptoms worsen and discussed when to report to ED.   HLD, LDL goal <70 - Continue rosuvastatin 20 mg once daily. 01/2023 LDL 54.    Informed Consent   Shared Decision Making/Informed Consent The risks [chest pain, shortness of breath, cardiac arrhythmias, dizziness, blood pressure fluctuations, myocardial infarction, stroke/transient ischemic attack, nausea, vomiting, allergic reaction, radiation exposure, metallic taste sensation and life-threatening complications (estimated to be 1 in 10,000)], benefits (risk stratification,  diagnosing coronary artery disease, treatment guidance) and alternatives of a cardiac PET stress test were discussed in detail with Mr. Raper and he agrees to proceed.     Dispo: Follow up in 2 months.   Signed, Alver Sorrow, NP

## 2023-06-04 ENCOUNTER — Encounter (HOSPITAL_COMMUNITY): Payer: Self-pay

## 2023-06-05 ENCOUNTER — Encounter: Payer: Self-pay | Admitting: Pulmonary Disease

## 2023-06-05 ENCOUNTER — Telehealth (HOSPITAL_COMMUNITY): Payer: Self-pay | Admitting: *Deleted

## 2023-06-05 ENCOUNTER — Ambulatory Visit: Payer: Medicare Other | Admitting: Pulmonary Disease

## 2023-06-05 VITALS — BP 130/76 | HR 65 | Temp 97.6°F | Ht 72.0 in | Wt 207.0 lb

## 2023-06-05 DIAGNOSIS — J189 Pneumonia, unspecified organism: Secondary | ICD-10-CM

## 2023-06-05 DIAGNOSIS — R053 Chronic cough: Secondary | ICD-10-CM | POA: Diagnosis not present

## 2023-06-05 MED ORDER — LEVOFLOXACIN 750 MG PO TABS
750.0000 mg | ORAL_TABLET | Freq: Every day | ORAL | 0 refills | Status: DC
Start: 2023-06-05 — End: 2023-07-18

## 2023-06-05 MED ORDER — PREDNISONE 20 MG PO TABS: ORAL_TABLET | ORAL | 0 refills | Status: AC

## 2023-06-05 NOTE — Patient Instructions (Signed)
 No changes to medications for now  I did send a course of levofloxacin (1 tablet a day for 7 days) and prednisone in case you get sick again in a.m., let me know if you need to take these  Try using the Breztri with a spacer provided  Return to clinic in 6 months or sooner as needed with Dr. Judeth Horn

## 2023-06-05 NOTE — Progress Notes (Signed)
 @Patient  ID: Allen Tapia, male    DOB: 1956-02-22, 68 y.o.   MRN: [id number]  Chief Complaint  Patient presents with   Follow-up    C/o "bark-like dry cough"    Referring provider: Kandyce Rud, MD  HPI:   67 y.o. man whom we seen in follow-up of recurrent bronchitis likely asthma with recurrent pneumonia likely whom are seen in follow-up.  Doing okay.  Continues with dry cough.  Dried up with the addition of Breztri.  But still some cough.  Residual cough not too bothersome.  We discussed Trelegy to try to improve things.  After shared decision making we decided to hold off.  Notably he has had a couple episodes pneumonia in the last year.  That demonstrated on chest x-ray, most recently 02/2023 with hazy opacity noted on my review interpretation.  Discussed expectant management, supply of prednisone, antibiotic to have on hand.  HPI at initial visit: Came back from traveling.  Shortly after developed congestion.  Settled on chest, wheeze etc.  Went to urgent care treated conservatively.  Symptoms not improved so again return to urgent care/ED.  Chest x-ray obtained on my review interpretation reveals clear lungs bilaterally.  Got antibiotics.  Did not improve.  Returned about a week later, chest x-ray on my review interpretation reveals small right lower lobe infiltrate atelectasis versus pneumonia.  WBCs were elevated on labs.  He was diagnosed with pneumonia.  Given additional antibiotics.  Steroids as well.  Slowly things are getting better.  He is not back to baseline.   Questionaires / Pulmonary Flowsheets:   ACT:  Asthma Control Test ACT Total Score  12/05/2022 11:27 AM 24  02/07/2022  9:37 AM 24  06/06/2020 10:13 AM 23    MMRC:     No data to display          Epworth:      No data to display          Tests:   FENO:  No results found for: "NITRICOXIDE"  PFT:     No data to display          WALK:      No data to display           Imaging: Personally reviewed No results found.  Lab Results: Personally reviewed and as per EMR discussion this note CBC    Component Value Date/Time   WBC 14.5 (H) 08/13/2022 2337   RBC 4.61 08/13/2022 2337   HGB 13.9 08/13/2022 2337   HCT 43.3 08/13/2022 2337   PLT 352 08/13/2022 2337   MCV 93.9 08/13/2022 2337   MCH 30.2 08/13/2022 2337   MCHC 32.1 08/13/2022 2337   RDW 13.4 08/13/2022 2337   LYMPHSABS 0.7 08/13/2022 2337   MONOABS 1.3 (H) 08/13/2022 2337   EOSABS 0.1 08/13/2022 2337   BASOSABS 0.1 08/13/2022 2337    BMET    Component Value Date/Time   NA 141 08/13/2022 2337   K 3.9 08/13/2022 2337   CL 104 08/13/2022 2337   CO2 26 08/13/2022 2337   GLUCOSE 95 08/13/2022 2337   BUN 15 08/13/2022 2337   CREATININE 0.73 08/13/2022 2337   CALCIUM 9.1 08/13/2022 2337   GFRNONAA >60 08/13/2022 2337    BNP    Component Value Date/Time   BNP 19.4 06/13/2022 1200    ProBNP No results found for: "PROBNP"  Specialty Problems       Pulmonary Problems   Dyspnea, unspecified   Mild  intermittent asthma    No Known Allergies  Immunization History  Administered Date(s) Administered   Influenza, High Dose Seasonal PF 12/25/2019   Influenza,inj,Quad PF,6+ Mos 01/18/2017, 01/21/2018, 01/09/2019   Influenza-Unspecified 12/24/2018   PFIZER(Purple Top)SARS-COV-2 Vaccination 06/15/2019, 07/13/2019   Pneumococcal Conjugate-13 01/21/2018   Pneumococcal Polysaccharide-23 07/23/2013, 01/29/2019   Td 09/23/2009   Zoster Recombinant(Shingrix) 07/24/2016, 12/13/2016    Past Medical History:  Diagnosis Date   Anginal pain (HCC)    atypical chest pain cardiac cath performed at that time   Arthritis    Asthma    Atypical chest pain    Barrett's esophagus    Bilateral cataracts    BPH (benign prostatic hyperplasia)    Chicken pox    Chronic constipation    Coronary artery disease 07/14/2007   a.) LHC 07/14/2007: 40% pRCA-1, 40% pRCA-2, 40% dRCA, 20% RPL1, 30%  LM, 20% mLCx-1, 40% mLCx-2, 20% OM3-1, 20% OM3-2, 20% pLAD, 40% mLAD-1, 70% mLAD-2, 40% mLAD-3, 30% dLAD, 40% D1, 40% D2 --> med mgmt.   Depression    Foreign body (FB) in soft tissue    Former tobacco use    GERD (gastroesophageal reflux disease)    History of 2019 novel coronavirus disease (COVID-19) 04/07/2019   a.) tested (+) at [facility] 04/16/2019, 05/30/2021; b.) 2 other (+) tests at home (dates unknown)   History of kidney stones    Hyperlipidemia    Hyperplastic colon polyp    Infrarenal abdominal aortic aneurysm (AAA) without rupture (HCC)    a.) MR lumbar spine 12/30/2020: infrarenal saccular AAA measuring 2.2 cm; b.) CTA A/P 01/05/2021: measured 2.6 cm; c.) s/p EVAR 03/03/2021   Ischemic heart disease 12/20/2010   Long term current use of immunosuppressive drug    a.) on ofatumumab for MS   MS (multiple sclerosis) (HCC)    a.) on ofatumumab   Shingles    Tubular adenoma of colon 08/23/2016    Tobacco History: Social History   Tobacco Use  Smoking Status Former   Current packs/day: 0.00   Average packs/day: 1 pack/day for 32.5 years (32.5 ttl pk-yrs)   Types: Cigarettes   Start date: 81   Quit date: 09/23/2006   Years since quitting: 16.7  Smokeless Tobacco Never   Counseling given: Not Answered   Continue to not smoke  Outpatient Encounter Medications as of 06/05/2023  Medication Sig   acetaminophen (TYLENOL) 500 MG tablet Take 2 tablets (1,000 mg total) by mouth every 6 (six) hours as needed for mild pain.   amLODipine (NORVASC) 5 MG tablet Take 1 tablet (5 mg total) by mouth daily.   amphetamine-dextroamphetamine (ADDERALL XR) 25 MG 24 hr capsule Take 25 mg by mouth daily as needed (focus).   aspirin EC 81 MG tablet Take 81 mg by mouth daily. Swallow whole.   baclofen (LIORESAL) 20 MG tablet Take 20 mg by mouth 3 (three) times daily.   BREZTRI AEROSPHERE 160-9-4.8 MCG/ACT AERO USE 2 INHALATIONS IN THE MORNING AND AT BEDTIME   buPROPion (WELLBUTRIN XL)  150 MG 24 hr tablet Take 150 mg by mouth every morning.   celecoxib (CELEBREX) 200 MG capsule Take 200 mg by mouth 2 (two) times daily.   gabapentin (NEURONTIN) 300 MG capsule Take 1-2 capsules (300-600 mg total) by mouth 2 (two) times daily. 600 mg in am and 300 mg in afternoon (Patient taking differently: Take 600 mg by mouth 2 (two) times daily.)   levofloxacin (LEVAQUIN) 750 MG tablet Take 1 tablet (750 mg  total) by mouth daily.   montelukast (SINGULAIR) 10 MG tablet TAKE 1 TABLET AT BEDTIME   Multiple Vitamins-Minerals (MULTIVITAMIN WITH MINERALS) tablet Take 1 tablet by mouth daily.   Ofatumumab (KESIMPTA) 20 MG/0.4ML SOAJ Inject 1 Dose into the skin every 30 (thirty) days. 1st day of every month   pantoprazole (PROTONIX) 20 MG tablet Take 1 tablet by mouth at bedtime.   Polyethyl Glycol-Propyl Glycol (SYSTANE) 0.4-0.3 % SOLN Apply 1 drop to eye 2 (two) times daily.   predniSONE (DELTASONE) 20 MG tablet Take 2 tablets (40 mg total) by mouth daily with breakfast for 5 days, THEN 1 tablet (20 mg total) daily with breakfast for 5 days.   promethazine-dextromethorphan (PROMETHAZINE-DM) 6.25-15 MG/5ML syrup Take 5 mLs by mouth 4 (four) times daily as needed for cough.   rosuvastatin (CRESTOR) 20 MG tablet TAKE 1 TABLET AT BEDTIME   Spacer/Aero-Holding Chambers (AEROCHAMBER MV) inhaler Use as instructed   tamsulosin (FLOMAX) 0.4 MG CAPS capsule Take 1 capsule (0.4 mg total) by mouth daily.   traZODone (DESYREL) 100 MG tablet Take 100 mg by mouth at bedtime.   VENTOLIN HFA 108 (90 Base) MCG/ACT inhaler USE 2 INHALATIONS EVERY 6 HOURS AS NEEDED FOR WHEEZING OR SHORTNESS OF BREATH   Vibegron (GEMTESA) 75 MG TABS Take 1 tablet (75 mg total) by mouth daily.   VITAMIN D PO Take 1 capsule by mouth daily.   [DISCONTINUED] clobetasol ointment (TEMOVATE) 0.05 % SMARTSIG:sparingly Topical Twice Daily (Patient not taking: Reported on 06/05/2023)   [DISCONTINUED] mirabegron ER (MYRBETRIQ) 50 MG TB24 tablet Take  1 tablet (50 mg total) by mouth daily. (Patient not taking: Reported on 06/05/2023)   No facility-administered encounter medications on file as of 06/05/2023.     Review of Systems  Review of Systems  N/a Physical Exam  BP 130/76 (BP Location: Left Arm, Patient Position: Sitting, Cuff Size: Normal)   Pulse 65   Temp 97.6 F (36.4 C) (Oral)   Ht 6' (1.829 m)   Wt 207 lb (93.9 kg)   SpO2 95%   BMI 28.07 kg/m   Wt Readings from Last 5 Encounters:  06/05/23 207 lb (93.9 kg)  05/20/23 210 lb 12.8 oz (95.6 kg)  04/17/23 205 lb (93 kg)  04/11/23 198 lb (89.8 kg)  12/05/22 204 lb (92.5 kg)    BMI Readings from Last 5 Encounters:  06/05/23 28.07 kg/m  05/20/23 28.59 kg/m  04/17/23 27.80 kg/m  04/11/23 26.85 kg/m  12/05/22 27.67 kg/m     Physical Exam General: Sitting in chair, no acute distress Eyes: EOMI, no icterus Neck: Supple, no JVP Pulmonary: Clear, normal work of breathing,  Cardiovascular: Warm, no edema Abdomen: Nondistended MSK: No synovitis, no joint effusion Neuro: Normal gait, no weakness Psych: Normal mood, full affect   Assessment & Plan:    Chronic cough: Prolonged bronchitis symptoms for several weeks prior to first evaluation.  Chest x-ray relatively clear with the exception of discussion above.  Gradually improving with time.  Multiple rounds of antibiotics without significant improvement.  Likely prolonged asthma exacerbation.  Improved with Breztri.  To continue Breztri.  Prednisone to have on hand.  Recurrent pneumonia: Possible related to medicine use for urologic care.  Improved with antibiotics and steroids.  Supply of antibiotics steroids to have on hand if needed.   Return in about 6 months (around 12/06/2023) for f/u Dr. Judeth Horn.   Karren Burly, MD 06/05/2023  I spent 40 minutes in care of patient including face-to-face visit, review  of records, coordination of care.

## 2023-06-05 NOTE — Telephone Encounter (Signed)
 Reaching out to patient to offer assistance regarding upcoming cardiac imaging study; pt verbalizes understanding of appt date/time, parking situation and where to check in, pre-test NPO status and verified current allergies; name and call back number provided for further questions should they arise  Larey Brick RN Navigator Cardiac Imaging [facility] [phone] office [phone] cell  Patient aware to avoid caffeine 12 hours prior to his cardiac PET scan.

## 2023-06-05 NOTE — Telephone Encounter (Signed)
 Attempted to call patient regarding upcoming cardiac PET appointment. Left message on voicemail with name and callback number Johney Frame RN Navigator Cardiac Imaging [facility] [phone] Office  Advised to avoid caffeine for 12 hours prior to test and hold flomax.

## 2023-06-06 ENCOUNTER — Ambulatory Visit
Admission: RE | Admit: 2023-06-06 | Discharge: 2023-06-06 | Disposition: A | Discharge status: Home / Self Care | Payer: Medicare Other | Source: Ambulatory Visit | Attending: Family | Admitting: Family

## 2023-06-06 DIAGNOSIS — I25118 Atherosclerotic heart disease of native coronary artery with other forms of angina pectoris: Secondary | ICD-10-CM | POA: Diagnosis not present

## 2023-06-06 DIAGNOSIS — R079 Chest pain, unspecified: Secondary | ICD-10-CM | POA: Diagnosis present

## 2023-06-06 DIAGNOSIS — I2510 Atherosclerotic heart disease of native coronary artery without angina pectoris: Secondary | ICD-10-CM | POA: Diagnosis not present

## 2023-06-06 DIAGNOSIS — I7121 Aneurysm of the ascending aorta, without rupture: Secondary | ICD-10-CM | POA: Diagnosis not present

## 2023-06-06 MED ORDER — RUBIDIUM RB82 GENERATOR (RUBYFILL)
24.3400 | PACK | Freq: Once | INTRAVENOUS | Status: AC
  Administered 2023-06-06: 24.34 via INTRAVENOUS

## 2023-06-06 MED ORDER — REGADENOSON 0.4 MG/5ML IV SOLN
INTRAVENOUS | Status: AC
  Filled 2023-06-06: qty 5

## 2023-06-06 MED ORDER — REGADENOSON 0.4 MG/5ML IV SOLN
0.4000 mg | Freq: Once | INTRAVENOUS | Status: AC
  Administered 2023-06-06: 0.4 mg via INTRAVENOUS
  Filled 2023-06-06: qty 5

## 2023-06-06 MED ORDER — RUBIDIUM RB82 GENERATOR (RUBYFILL)
24.3500 | PACK | Freq: Once | INTRAVENOUS | Status: AC
  Administered 2023-06-06: 24.35 via INTRAVENOUS

## 2023-06-06 NOTE — Progress Notes (Signed)
 Patient presents for a cardiac PET stress test and tolerated procedure without incident. Patient maintained acceptable vital signs throughout the test and was offered caffeine after test.  Patient ambulated out of department with a steady gait.

## 2023-06-08 ENCOUNTER — Encounter (HOSPITAL_BASED_OUTPATIENT_CLINIC_OR_DEPARTMENT_OTHER): Payer: Self-pay

## 2023-06-08 LAB — NM PET CT CARDIAC PERFUSION MULTI W/ABSOLUTE BLOODFLOW
LV dias vol: 140 mL (ref 62–150)
LV sys vol: 57 mL
MBFR: 2.08
Nuc Rest EF: 55 %
Nuc Stress EF: 59 %
Peak HR: 74 {beats}/min
Rest HR: 55 {beats}/min
Rest MBF: 0.6 ml/g/min
Rest Nuclear Isotope Dose: 24.3 mCi
SRS: 0
SSS: 2
ST Depression (mm): 0 mm
Stress MBF: 1.25 ml/g/min
Stress Nuclear Isotope Dose: 24.4 mCi
TID: 1.01

## 2023-06-13 ENCOUNTER — Other Ambulatory Visit: Payer: Self-pay | Admitting: Urology

## 2023-06-13 DIAGNOSIS — N138 Other obstructive and reflux uropathy: Secondary | ICD-10-CM

## 2023-06-13 DIAGNOSIS — N3281 Overactive bladder: Secondary | ICD-10-CM

## 2023-06-14 MED ORDER — ISOSORBIDE MONONITRATE ER 30 MG PO TB24: 30.0000 mg | ORAL_TABLET | Freq: Every day | ORAL | 3 refills | Status: DC

## 2023-06-14 NOTE — Addendum Note (Signed)
 Addended by: Marlene Lard on: 06/14/2023 03:45 PM   Modules accepted: Orders

## 2023-06-20 ENCOUNTER — Encounter: Payer: Self-pay | Admitting: Vascular Surgery

## 2023-07-18 ENCOUNTER — Ambulatory Visit (INDEPENDENT_AMBULATORY_CARE_PROVIDER_SITE_OTHER): Payer: Medicare Other | Admitting: Nurse Practitioner

## 2023-07-18 ENCOUNTER — Encounter (HOSPITAL_BASED_OUTPATIENT_CLINIC_OR_DEPARTMENT_OTHER): Payer: Self-pay | Admitting: Nurse Practitioner

## 2023-07-18 VITALS — BP 110/72 | HR 66 | Ht 72.0 in | Wt 208.4 lb

## 2023-07-18 DIAGNOSIS — I77819 Aortic ectasia, unspecified site: Secondary | ICD-10-CM

## 2023-07-18 DIAGNOSIS — E785 Hyperlipidemia, unspecified: Secondary | ICD-10-CM | POA: Diagnosis not present

## 2023-07-18 DIAGNOSIS — I25118 Atherosclerotic heart disease of native coronary artery with other forms of angina pectoris: Secondary | ICD-10-CM | POA: Diagnosis not present

## 2023-07-18 DIAGNOSIS — I10 Essential (primary) hypertension: Secondary | ICD-10-CM

## 2023-07-18 DIAGNOSIS — Z9889 Other specified postprocedural states: Secondary | ICD-10-CM | POA: Diagnosis not present

## 2023-07-18 NOTE — Patient Instructions (Signed)
 Medication Instructions:   Your physician recommends that you continue on your current medications as directed. Please refer to the Current Medication list given to you today.   *If you need a refill on your cardiac medications before your next appointment, please call your pharmacy*  Lab Work:  None ordered.  If you have labs (blood work) drawn today and your tests are completely normal, you will receive your results only by: MyChart Message (if you have MyChart) OR A paper copy in the mail If you have any lab test that is abnormal or we need to change your treatment, we will call you to review the results.  Testing/Procedures:  Non-Cardiac CT scanning, (CAT scanning), is a noninvasive, special x-ray that produces cross-sectional images of the body using x-rays and a computer. CT scans help physicians diagnose and treat medical conditions. For some CT exams, a contrast material is used to enhance visibility in the area of the body being studied. CT scans provide greater clarity and reveal more details than regular x-ray exams.   Follow-Up: At [facility], you and your health needs are our priority.  As part of our continuing mission to provide you with exceptional heart care, our providers are all part of one team.  This team includes your primary Cardiologist (physician) and Advanced Practice Providers or APPs (Physician Assistants and Nurse Practitioners) who all work together to provide you with the care you need, when you need it.  Your next appointment:   To be determined   Provider:       We recommend signing up for the patient portal called "MyChart".  Sign up information is provided on this After Visit Summary.  MyChart is used to connect with patients for Virtual Visits (Telemedicine).  Patients are able to view lab/test results, encounter notes, upcoming appointments, etc.  Non-urgent messages can be sent to your provider as well.   To learn more about what you can  do with MyChart, go to [email or web address].

## 2023-07-18 NOTE — Progress Notes (Addendum)
 Cardiology Office Note:  .   Date:  07/18/2023  ID:  Allen Tapia, DOB 1955/05/28, MRN [id number] PCP: Nestor Banter, MD  [facility] Providers Cardiologist:  Sheryle Donning, MD    Patient Profile: .      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  Other vessels with nonobstructive disease.  He established with Dr. Veryl Gottron 05/04/2019 at which time he reported taking Gilemya for multiple sclerosis for a number of years.  Initially had heart pounding but now tolerates.  He reported feeling like he was moving slower than in the past, losing strength/fine motor skills.  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  He had been retired for about 7 years at that time.  Last cardiology clinic visit was 05/20/2023 with Neomi Banks, NP.  He presented with chest pain and elevated BP at home 135-150/80-90.  He had not had his BP checked in the office.  He reported pain at rest in his chair that occurs also when up and moving around at times.  Pain midsternal to left side with occasional radiation down his left arm.  He reported infrequent palpitations when resting.  He has chronic musculoskeletal pain secondary to MS but notes present pain feels different than previous musculoskeletal pain.  He is frustrated that he has not been as mobile and has gained weight, but ultimately attributes this to the season.  Family history of father and brother having CABG in the  past. Amlodipine  5 mg daily was added. EKG was unremarkable but due to chest pain, cardiac PET was ordered. LDL was at goal.   Cardiac PET 06/06/23 revealed small area of apical ischemia but otherwise low risk with normal heart function. He was advised to start Imdur  30 mg daily.        History of Present Illness: .    History of Present Illness Allen Tapia is a very pleasant 68 y.o. male who is here today for follow-up of chest pain. He reports he continues to have chest discomfort particularly in two locations, mid sternal and left breast. He feels pain most frequently at rest, but admits he has been less active since starting Imdur . He reports pain has improved with Imdur  but he is having some concerning side effects. He notes a constant dull headache and pain in the back of his neck.  He also feels more fatigued since starting it.  He is remaining active but admits it is a struggle. Symptoms have not significantly inhibited his activities. BP readings at home have been elevated. He brought his machine for calibration and his machine is reading higher than ours. He denies shortness of breath, palpitations, orthopnea, PND, edema, presyncope or syncope. He is very concerned about his chest pain due to the fact that both his brother and father had CABG and did not have significant symptoms prior. He also had LAD stenosis on cath in 2009 which he believes was in the 70% range.  His brother was hospitalized for 67 days due to complications post  CABG.  He would like to undergo cardiac catheterization for further evaluation.   Discussed the use of AI scribe software for clinical note transcription with the patient, who gave verbal consent to proceed.   ROS: See HPI       Studies Reviewed: Aaron Aas         Lipoprotein (a)  Date/Time Value Ref Range Status  10/24/2022 10:22 AM 64.0 <75.0 nmol/L Final    Comment:    Note:  Values greater than or equal to 75.0 nmol/L may        indicate an independent  risk factor for CHD,        but must be evaluated with caution when applied        to non-Caucasian populations due to the        influence of genetic factors on Lp(a) across        ethnicities.      Risk Assessment/Calculations:             Physical Exam:   VS:  BP 110/72 (BP Location: Left Arm, Patient Position: Sitting, Cuff Size: Normal)   Pulse 66   Ht 6' (1.829 m)   Wt 208 lb 6.4 oz (94.5 kg)   SpO2 96%   BMI 28.26 kg/m    Wt Readings from Last 3 Encounters:  07/18/23 208 lb 6.4 oz (94.5 kg)  06/05/23 207 lb (93.9 kg)  05/20/23 210 lb 12.8 oz (95.6 kg)    GEN: Well nourished, well developed in no acute distress NECK: No JVD; No carotid bruits CARDIAC: RRR, no murmurs, rubs, gallops RESPIRATORY:  Clear to auscultation without rales, wheezing or rhonchi  ABDOMEN: Soft, non-tender, non-distended EXTREMITIES:  No edema; No deformity     ASSESSMENT AND PLAN: .    Assessment & Plan Coronary artery disease with stable angina History of 70% LAD stenosis from cath at [facility] in 2009 per his report. Current chest discomfort differs from previous episodes, occurring primarily at rest. Discomfort has improved somewhat with Imdur , but he is concerned about headache and fatigue on Imdur . Severe calcifications and area of apical ischemia noted on PET scan. He would like to undergo cardiac cath for further evaluation of ischemia. Advised I will forward note to Dr. Veryl Gottron for her review of PET images to determine need for heart catheterization. Continue Imdur , with a possible dose reduction to 15 mg if fatigue or lightheadedness worsens. Encouraged him to remain active with walking and to notify us  if symptoms worsens.  No bleeding concerns on aspirin .  Continue aspirin , amlodipine , Imdur , rosuvastatin .  Addendum: Case discussed with primary cardiologist, Dr. Veryl Gottron, who agrees that patient should proceed with cardiac catheterization.  Informed Consent   Shared Decision  Making/Informed Consent The risks [stroke (1 in 1000), death (1 in 1000), kidney failure [usually temporary] (1 in 500), bleeding (1 in 200), allergic reaction [possibly serious] (1 in 200)], benefits (diagnostic support and management of coronary artery disease) and alternatives of a cardiac catheterization were discussed in detail with Mr. Loeza and he is willing to proceed.     Thoracic Aortic dilatation   Mild aortic dilatation is noted on CT 06/08/23, measuring 4.2 cm. He is asymptomatic. History of abdominal aneurysm repair by Dr. Vikki Graves, [facility], to whom he forwarded results of CT. Was advised that referral to CT surgeon would be needed for thoracic aneurysm. We will get CTA aorta in March 2026 to monitor aortic dilatation.   History of AAA History of EVAR to saccular aneurysm  of infrarenal abdominal aorta 02/2021. He is followed by Dr. Vikki Graves. No acute concerns today. Management per [facility].   Hypertension   Blood pressure in clinic today is well controlled. Home readings are variable, with better control in the afternoon after medication. His machine is reading higher than our manual readings in the office. Advised continued routine monitoring and consideration of getting a new cuff.  No change in antihypertensive therapy today.  Hyperlipidemia  LDL goal < 70 Lipid panel completed 02/05/2023 with total cholesterol 109, triglyceride 45, HDL 46.5, and LDL-C 54.  Lipids are well-controlled.  Continue rosuvastatin .        Disposition:TBD based on determination for cardiac cath  Signed, Slater Duncan, NP-C

## 2023-07-18 NOTE — H&P (View-Only) (Signed)
 Cardiology Office Note:  .   Date:  07/18/2023  ID:  Allen Tapia, DOB 1955/05/28, MRN [id number] PCP: Nestor Banter, MD  [facility] Providers Cardiologist:  Sheryle Donning, MD    Patient Profile: .      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  Other vessels with nonobstructive disease.  He established with Dr. Veryl Gottron 05/04/2019 at which time he reported taking Gilemya for multiple sclerosis for a number of years.  Initially had heart pounding but now tolerates.  He reported feeling like he was moving slower than in the past, losing strength/fine motor skills.  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  He had been retired for about 7 years at that time.  Last cardiology clinic visit was 05/20/2023 with Neomi Banks, NP.  He presented with chest pain and elevated BP at home 135-150/80-90.  He had not had his BP checked in the office.  He reported pain at rest in his chair that occurs also when up and moving around at times.  Pain midsternal to left side with occasional radiation down his left arm.  He reported infrequent palpitations when resting.  He has chronic musculoskeletal pain secondary to MS but notes present pain feels different than previous musculoskeletal pain.  He is frustrated that he has not been as mobile and has gained weight, but ultimately attributes this to the season.  Family history of father and brother having CABG in the  past. Amlodipine  5 mg daily was added. EKG was unremarkable but due to chest pain, cardiac PET was ordered. LDL was at goal.   Cardiac PET 06/06/23 revealed small area of apical ischemia but otherwise low risk with normal heart function. He was advised to start Imdur  30 mg daily.        History of Present Illness: .    History of Present Illness Allen Tapia is a very pleasant 68 y.o. male who is here today for follow-up of chest pain. He reports he continues to have chest discomfort particularly in two locations, mid sternal and left breast. He feels pain most frequently at rest, but admits he has been less active since starting Imdur . He reports pain has improved with Imdur  but he is having some concerning side effects. He notes a constant dull headache and pain in the back of his neck.  He also feels more fatigued since starting it.  He is remaining active but admits it is a struggle. Symptoms have not significantly inhibited his activities. BP readings at home have been elevated. He brought his machine for calibration and his machine is reading higher than ours. He denies shortness of breath, palpitations, orthopnea, PND, edema, presyncope or syncope. He is very concerned about his chest pain due to the fact that both his brother and father had CABG and did not have significant symptoms prior. He also had LAD stenosis on cath in 2009 which he believes was in the 70% range.  His brother was hospitalized for 67 days due to complications post  CABG.  He would like to undergo cardiac catheterization for further evaluation.   Discussed the use of AI scribe software for clinical note transcription with the patient, who gave verbal consent to proceed.   ROS: See HPI       Studies Reviewed: Aaron Aas         Lipoprotein (a)  Date/Time Value Ref Range Status  10/24/2022 10:22 AM 64.0 <75.0 nmol/L Final    Comment:    Note:  Values greater than or equal to 75.0 nmol/L may        indicate an independent  risk factor for CHD,        but must be evaluated with caution when applied        to non-Caucasian populations due to the        influence of genetic factors on Lp(a) across        ethnicities.      Risk Assessment/Calculations:             Physical Exam:   VS:  BP 110/72 (BP Location: Left Arm, Patient Position: Sitting, Cuff Size: Normal)   Pulse 66   Ht 6' (1.829 m)   Wt 208 lb 6.4 oz (94.5 kg)   SpO2 96%   BMI 28.26 kg/m    Wt Readings from Last 3 Encounters:  07/18/23 208 lb 6.4 oz (94.5 kg)  06/05/23 207 lb (93.9 kg)  05/20/23 210 lb 12.8 oz (95.6 kg)    GEN: Well nourished, well developed in no acute distress NECK: No JVD; No carotid bruits CARDIAC: RRR, no murmurs, rubs, gallops RESPIRATORY:  Clear to auscultation without rales, wheezing or rhonchi  ABDOMEN: Soft, non-tender, non-distended EXTREMITIES:  No edema; No deformity     ASSESSMENT AND PLAN: .    Assessment & Plan Coronary artery disease with stable angina History of 70% LAD stenosis from cath at [facility] in 2009 per his report. Current chest discomfort differs from previous episodes, occurring primarily at rest. Discomfort has improved somewhat with Imdur , but he is concerned about headache and fatigue on Imdur . Severe calcifications and area of apical ischemia noted on PET scan. He would like to undergo cardiac cath for further evaluation of ischemia. Advised I will forward note to Dr. Veryl Gottron for her review of PET images to determine need for heart catheterization. Continue Imdur , with a possible dose reduction to 15 mg if fatigue or lightheadedness worsens. Encouraged him to remain active with walking and to notify us  if symptoms worsens.  No bleeding concerns on aspirin .  Continue aspirin , amlodipine , Imdur , rosuvastatin .  Addendum: Case discussed with primary cardiologist, Dr. Veryl Gottron, who agrees that patient should proceed with cardiac catheterization.  Informed Consent   Shared Decision  Making/Informed Consent The risks [stroke (1 in 1000), death (1 in 1000), kidney failure [usually temporary] (1 in 500), bleeding (1 in 200), allergic reaction [possibly serious] (1 in 200)], benefits (diagnostic support and management of coronary artery disease) and alternatives of a cardiac catheterization were discussed in detail with Mr. Loeza and he is willing to proceed.     Thoracic Aortic dilatation   Mild aortic dilatation is noted on CT 06/08/23, measuring 4.2 cm. He is asymptomatic. History of abdominal aneurysm repair by Dr. Vikki Graves, [facility], to whom he forwarded results of CT. Was advised that referral to CT surgeon would be needed for thoracic aneurysm. We will get CTA aorta in March 2026 to monitor aortic dilatation.   History of AAA History of EVAR to saccular aneurysm  of infrarenal abdominal aorta 02/2021. He is followed by Dr. Vikki Graves. No acute concerns today. Management per [facility].   Hypertension   Blood pressure in clinic today is well controlled. Home readings are variable, with better control in the afternoon after medication. His machine is reading higher than our manual readings in the office. Advised continued routine monitoring and consideration of getting a new cuff.  No change in antihypertensive therapy today.  Hyperlipidemia  LDL goal < 70 Lipid panel completed 02/05/2023 with total cholesterol 109, triglyceride 45, HDL 46.5, and LDL-C 54.  Lipids are well-controlled.  Continue rosuvastatin .        Disposition:TBD based on determination for cardiac cath  Signed, Slater Duncan, NP-C

## 2023-07-22 ENCOUNTER — Telehealth: Payer: Self-pay | Admitting: Nurse Practitioner

## 2023-07-22 DIAGNOSIS — Z01812 Encounter for preprocedural laboratory examination: Secondary | ICD-10-CM

## 2023-07-22 DIAGNOSIS — I10 Essential (primary) hypertension: Secondary | ICD-10-CM

## 2023-07-22 NOTE — Telephone Encounter (Signed)
 Discussed patient's symptoms with Dr. Veryl Gottron, primary cardiologist, who agrees that we proceed with cardiac catheterization. We will have him come in for labs and EKG.   Informed Consent   Shared Decision Making/Informed Consent The risks [stroke (1 in 1000), death (1 in 1000), kidney failure [usually temporary] (1 in 500), bleeding (1 in 200), allergic reaction [possibly serious] (1 in 200)], benefits (diagnostic support and management of coronary artery disease) and alternatives of a cardiac catheterization were discussed in detail with Allen Tapia and he is willing to proceed.

## 2023-07-23 ENCOUNTER — Encounter (HOSPITAL_BASED_OUTPATIENT_CLINIC_OR_DEPARTMENT_OTHER): Payer: Self-pay | Admitting: *Deleted

## 2023-07-23 NOTE — Telephone Encounter (Signed)
 Patient scheduled for Friday 07/27/2023 at 7:30 am with Dr Abel Hoe  Will come tomorrow for EKG and labs Patient is aware of and will letter for pick up at front desk

## 2023-07-23 NOTE — Addendum Note (Signed)
 Addended by: Gerldine Koch on: 07/23/2023 12:13 PM   Modules accepted: Orders

## 2023-07-24 ENCOUNTER — Ambulatory Visit (HOSPITAL_BASED_OUTPATIENT_CLINIC_OR_DEPARTMENT_OTHER)

## 2023-07-24 DIAGNOSIS — I10 Essential (primary) hypertension: Secondary | ICD-10-CM

## 2023-07-24 NOTE — Progress Notes (Signed)
   Nurse Visit   Date of Encounter: 07/24/2023 ID: EDDER THOMLINSON, DOB 08-Sep-1955, MRN [id number]  PCP:  Nestor Banter, MD   [facility] Providers Cardiologist:  Sheryle Donning, MD      Visit Details   VS:  There were no vitals taken for this visit. , BMI There is no height or weight on file to calculate BMI.  Wt Readings from Last 3 Encounters:  07/18/23 208 lb 6.4 oz (94.5 kg)  06/05/23 207 lb (93.9 kg)  05/20/23 210 lb 12.8 oz (95.6 kg)     Reason for visit: EKG Performed today: EKG and Provider consulted:Dr. Veryl Gottron and Slater Duncan, NP Changes (medications, testing, etc.) : CBC and BMET Length of Visit: 15 minutes    Medications Adjustments/Labs and Tests Ordered: Orders Placed This Encounter  Procedures   CBC   Basic metabolic panel with GFR   EKG [id number]   No orders of the defined types were placed in this encounter.    Signed, Nilay Mangrum M Mertice Uffelman, LPN  12/08/7827 5:62 PM

## 2023-07-24 NOTE — Patient Instructions (Signed)
 Medication Instructions:  Your physician recommends that you continue on your current medications as directed. Please refer to the Current Medication list given to you today.   *If you need a refill on your cardiac medications before your next appointment, please call your pharmacy*  Lab Work: Your physician recommends that you return for lab work: CBC and BMET If you have labs (blood work) drawn today and your tests are completely normal, you will receive your results only by: MyChart Message (if you have MyChart) OR A paper copy in the mail If you have any lab test that is abnormal or we need to change your treatment, we will call you to review the results.  Follow-Up: At [facility], you and your health needs are our priority.  As part of our continuing mission to provide you with exceptional heart care, our providers are all part of one team.  This team includes your primary Cardiologist (physician) and Advanced Practice Providers or APPs (Physician Assistants and Nurse Practitioners) who all work together to provide you with the care you need, when you need it.  Please follow up in 1 year with Dr. Veryl Gottron, Slater Duncan, NP or Neomi Banks, NP   We recommend signing up for the patient portal called "MyChart".  Sign up information is provided on this After Visit Summary.  MyChart is used to connect with patients for Virtual Visits (Telemedicine).  Patients are able to view lab/test results, encounter notes, upcoming appointments, etc.  Non-urgent messages can be sent to your provider as well.   To learn more about what you can do with MyChart, go to [email or web address].

## 2023-07-25 ENCOUNTER — Encounter (HOSPITAL_BASED_OUTPATIENT_CLINIC_OR_DEPARTMENT_OTHER): Payer: Self-pay

## 2023-07-25 ENCOUNTER — Telehealth: Payer: Self-pay | Admitting: *Deleted

## 2023-07-25 LAB — BASIC METABOLIC PANEL WITH GFR
BUN/Creatinine Ratio: 16 (ref 10–24)
BUN: 14 mg/dL (ref 8–27)
CO2: 22 mmol/L (ref 20–29)
Calcium: 9 mg/dL (ref 8.6–10.2)
Chloride: 105 mmol/L (ref 96–106)
Creatinine, Ser: 0.86 mg/dL (ref 0.76–1.27)
Glucose: 91 mg/dL (ref 70–99)
Potassium: 4.2 mmol/L (ref 3.5–5.2)
Sodium: 143 mmol/L (ref 134–144)
eGFR: 94 mL/min/{1.73_m2} (ref >59)

## 2023-07-25 LAB — CBC
Hematocrit: 41.2 % (ref 37.5–51.0)
Hemoglobin: 14.1 g/dL (ref 13.0–17.7)
MCH: 31.5 pg (ref 26.6–33.0)
MCHC: 34.2 g/dL (ref 31.5–35.7)
MCV: 92 fL (ref 79–97)
Platelets: 217 10*3/uL (ref 150–450)
RBC: 4.48 x10E6/uL (ref 4.14–5.80)
RDW: 12.8 % (ref 11.6–15.4)
WBC: 5.8 10*3/uL (ref 3.4–10.8)

## 2023-07-25 NOTE — Telephone Encounter (Signed)
 Cardiac Catheterization scheduled at [facility] for: Friday Jul 26, 2023 7:30 AM Arrival time [facility] Main Entrance A at: 5:30 AM  Nothing to eat after midnight prior to procedure, clear liquids until 5 AM day of procedure.  Medication instructions: -Usual morning medications can be taken with sips of water including aspirin  81 mg.  Plan to go home the same day, you will only stay overnight if medically necessary.  You must have responsible adult to drive you home.  Someone must be with you the first 24 hours after you arrive home.  Reviewed procedure instructions with patient.

## 2023-07-26 ENCOUNTER — Other Ambulatory Visit: Payer: Self-pay | Admitting: *Deleted

## 2023-07-26 ENCOUNTER — Other Ambulatory Visit: Payer: Self-pay

## 2023-07-26 ENCOUNTER — Ambulatory Visit (HOSPITAL_COMMUNITY)
Admission: RE | Admit: 2023-07-26 | Discharge: 2023-07-26 | Disposition: A | Discharge status: Home / Self Care | Attending: Cardiovascular Disease | Admitting: Cardiovascular Disease

## 2023-07-26 ENCOUNTER — Ambulatory Visit (HOSPITAL_COMMUNITY)

## 2023-07-26 ENCOUNTER — Encounter (HOSPITAL_COMMUNITY)
Admission: RE | Disposition: A | Discharge status: Home / Self Care | Payer: Self-pay | Source: Home / Self Care | Attending: Cardiovascular Disease

## 2023-07-26 DIAGNOSIS — I340 Nonrheumatic mitral (valve) insufficiency: Secondary | ICD-10-CM

## 2023-07-26 DIAGNOSIS — I25118 Atherosclerotic heart disease of native coronary artery with other forms of angina pectoris: Secondary | ICD-10-CM | POA: Diagnosis not present

## 2023-07-26 DIAGNOSIS — I10 Essential (primary) hypertension: Secondary | ICD-10-CM | POA: Diagnosis not present

## 2023-07-26 DIAGNOSIS — E785 Hyperlipidemia, unspecified: Secondary | ICD-10-CM | POA: Insufficient documentation

## 2023-07-26 DIAGNOSIS — Z79899 Other long term (current) drug therapy: Secondary | ICD-10-CM | POA: Diagnosis not present

## 2023-07-26 DIAGNOSIS — R7303 Prediabetes: Secondary | ICD-10-CM | POA: Insufficient documentation

## 2023-07-26 DIAGNOSIS — I2584 Coronary atherosclerosis due to calcified coronary lesion: Secondary | ICD-10-CM | POA: Diagnosis not present

## 2023-07-26 DIAGNOSIS — I080 Rheumatic disorders of both mitral and aortic valves: Secondary | ICD-10-CM | POA: Diagnosis not present

## 2023-07-26 DIAGNOSIS — G35 Multiple sclerosis: Secondary | ICD-10-CM | POA: Diagnosis not present

## 2023-07-26 DIAGNOSIS — Z8249 Family history of ischemic heart disease and other diseases of the circulatory system: Secondary | ICD-10-CM | POA: Diagnosis not present

## 2023-07-26 DIAGNOSIS — Z87891 Personal history of nicotine dependence: Secondary | ICD-10-CM | POA: Insufficient documentation

## 2023-07-26 DIAGNOSIS — I77810 Thoracic aortic ectasia: Secondary | ICD-10-CM | POA: Diagnosis not present

## 2023-07-26 HISTORY — PX: LEFT HEART CATH AND CORONARY ANGIOGRAPHY: CATH118249

## 2023-07-26 LAB — POCT ACTIVATED CLOTTING TIME: Activated Clotting Time: 251 s

## 2023-07-26 LAB — ECHOCARDIOGRAM COMPLETE
Area-P 1/2: 3.72 cm2
Height: 72 in
S' Lateral: 2.58 cm
Weight: 3232 [oz_av]

## 2023-07-26 SURGERY — LEFT HEART CATH AND CORONARY ANGIOGRAPHY
Anesthesia: LOCAL

## 2023-07-26 MED ORDER — ACETAMINOPHEN 325 MG PO TABS: 650.0000 mg | ORAL_TABLET | ORAL | Status: DC | PRN

## 2023-07-26 MED ORDER — HEPARIN SODIUM (PORCINE) 1000 UNIT/ML IJ SOLN
INTRAMUSCULAR | Status: AC
Start: 2023-07-26 — End: ?
  Filled 2023-07-26: qty 10

## 2023-07-26 MED ORDER — LIDOCAINE HCL (PF) 1 % IJ SOLN
INTRAMUSCULAR | Status: AC
  Filled 2023-07-26: qty 30

## 2023-07-26 MED ORDER — HEPARIN SODIUM (PORCINE) 1000 UNIT/ML IJ SOLN
INTRAMUSCULAR | Status: DC | PRN
  Administered 2023-07-26 (×2): 5000 [IU] via INTRAVENOUS

## 2023-07-26 MED ORDER — FENTANYL CITRATE (PF) 100 MCG/2ML IJ SOLN
INTRAMUSCULAR | Status: AC
  Filled 2023-07-26: qty 2

## 2023-07-26 MED ORDER — VERAPAMIL HCL 2.5 MG/ML IV SOLN
INTRAVENOUS | Status: AC
  Filled 2023-07-26: qty 2

## 2023-07-26 MED ORDER — LABETALOL HCL 5 MG/ML IV SOLN: 10.0000 mg | INTRAVENOUS | Status: DC | PRN

## 2023-07-26 MED ORDER — SODIUM CHLORIDE 0.9 % IV SOLN: INTRAVENOUS | Status: AC

## 2023-07-26 MED ORDER — IOHEXOL 350 MG/ML SOLN
INTRAVENOUS | Status: DC | PRN
  Administered 2023-07-26: 45 mL

## 2023-07-26 MED ORDER — ASPIRIN 81 MG PO CHEW
81.0000 mg | CHEWABLE_TABLET | ORAL | Status: DC
Start: 2023-07-26 — End: 2023-07-26

## 2023-07-26 MED ORDER — NITROGLYCERIN 1 MG/10 ML FOR IR/CATH LAB
INTRA_ARTERIAL | Status: AC
  Filled 2023-07-26: qty 10

## 2023-07-26 MED ORDER — ASPIRIN 81 MG PO CHEW: 81.0000 mg | CHEWABLE_TABLET | ORAL | Status: DC

## 2023-07-26 MED ORDER — SODIUM CHLORIDE 0.9% FLUSH: 3.0000 mL | Freq: Two times a day (BID) | INTRAVENOUS | Status: DC

## 2023-07-26 MED ORDER — FENTANYL CITRATE (PF) 100 MCG/2ML IJ SOLN
INTRAMUSCULAR | Status: DC | PRN
  Administered 2023-07-26: 50 ug via INTRAVENOUS

## 2023-07-26 MED ORDER — VERAPAMIL HCL 2.5 MG/ML IV SOLN
INTRAVENOUS | Status: DC | PRN
  Administered 2023-07-26: 10 mL via INTRA_ARTERIAL

## 2023-07-26 MED ORDER — ONDANSETRON HCL 4 MG/2ML IJ SOLN: 4.0000 mg | Freq: Four times a day (QID) | INTRAMUSCULAR | Status: DC | PRN

## 2023-07-26 MED ORDER — SODIUM CHLORIDE 0.9 % IV SOLN: 250.0000 mL | INTRAVENOUS | Status: DC | PRN

## 2023-07-26 MED ORDER — LIDOCAINE HCL (PF) 1 % IJ SOLN
INTRAMUSCULAR | Status: DC | PRN
  Administered 2023-07-26: 2 mL

## 2023-07-26 MED ORDER — MIDAZOLAM HCL 2 MG/2ML IJ SOLN
INTRAMUSCULAR | Status: DC | PRN
  Administered 2023-07-26: 2 mg via INTRAVENOUS

## 2023-07-26 MED ORDER — MIDAZOLAM HCL 2 MG/2ML IJ SOLN
INTRAMUSCULAR | Status: AC
Start: 2023-07-26 — End: ?
  Filled 2023-07-26: qty 2

## 2023-07-26 MED ORDER — HYDRALAZINE HCL 20 MG/ML IJ SOLN: 10.0000 mg | INTRAMUSCULAR | Status: DC | PRN

## 2023-07-26 MED ORDER — SODIUM CHLORIDE 0.9 % WEIGHT BASED INFUSION: 3.0000 mL/kg/h | INTRAVENOUS | Status: AC

## 2023-07-26 MED ORDER — HEPARIN (PORCINE) IN NACL 1000-0.9 UT/500ML-% IV SOLN
INTRAVENOUS | Status: DC | PRN
  Administered 2023-07-26 (×2): 500 mL

## 2023-07-26 MED ORDER — SODIUM CHLORIDE 0.9 % WEIGHT BASED INFUSION: 1.0000 mL/kg/h | INTRAVENOUS | Status: DC

## 2023-07-26 MED ORDER — SODIUM CHLORIDE 0.9% FLUSH: 3.0000 mL | INTRAVENOUS | Status: DC | PRN

## 2023-07-26 SURGICAL SUPPLY — 10 items
CATH 5FR JL3.5 JR4 ANG PIG MP (CATHETERS) IMPLANT
CATH VISTA GUIDE 6FR XBLD 3.5 (CATHETERS) IMPLANT
DEVICE RAD COMP TR BAND LRG (VASCULAR PRODUCTS) IMPLANT
GLIDESHEATH SLEND SS 6F .021 (SHEATH) IMPLANT
GUIDEWIRE INQWIRE 1.5J.035X260 (WIRE) IMPLANT
KIT ENCORE 26 ADVANTAGE (KITS) IMPLANT
KIT SYRINGE INJ CVI SPIKEX1 (MISCELLANEOUS) IMPLANT
PACK CARDIAC CATHETERIZATION (CUSTOM PROCEDURE TRAY) ×1 IMPLANT
SET ATX-X65L (MISCELLANEOUS) IMPLANT
SHEATH 6FR 85 DEST SLENDER (SHEATH) IMPLANT

## 2023-07-26 NOTE — Progress Notes (Signed)
 Echo order per Dr. Abel Hoe.  Scheduled for today.

## 2023-07-26 NOTE — Progress Notes (Signed)
TR band removed at 1020, gauze dressing applied. Right radial level 0, clean, dry, and intact. Patient walked to the bathroom without difficulties.

## 2023-07-26 NOTE — Discharge Instructions (Signed)

## 2023-07-26 NOTE — Interval H&P Note (Signed)
 History and Physical Interval Note:  07/26/2023 7:19 AM  Allen Tapia  has presented today for surgery, with the diagnosis of chest pain.  The various methods of treatment have been discussed with the patient and family. After consideration of risks, benefits and other options for treatment, the patient has consented to  Procedure(s): LEFT HEART CATH AND CORONARY ANGIOGRAPHY (N/A) as a surgical intervention.  The patient's history has been reviewed, patient examined, no change in status, stable for surgery.  I have reviewed the patient's chart and labs.  Questions were answered to the patient's satisfaction.    Cath Lab Visit (complete for each Cath Lab visit)  Clinical Evaluation Leading to the Procedure:   ACS: No.  Non-ACS:    Anginal Classification: CCS III  Anti-ischemic medical therapy: Maximal Therapy (2 or more classes of medications)  Non-Invasive Test Results: Low risk PET CT stress  Prior CABG: No previous CABG        Allen Tapia

## 2023-07-27 ENCOUNTER — Encounter (HOSPITAL_COMMUNITY): Payer: Self-pay | Admitting: Cardiovascular Disease

## 2023-07-30 MED FILL — Nitroglycerin IV Soln 100 MCG/ML in D5W: INTRA_ARTERIAL | Qty: 10 | Status: AC

## 2023-08-01 NOTE — Progress Notes (Signed)
 [address]       [place] [address]             [phone]        CHAIM SWARINGEN [facility] Medical Record #[id number] Date of Birth: 1955/09/15  Referring: Nestor Banter, MD Primary Care: Nestor Banter, MD Primary Cardiologist:Bridgette Veryl Gottron, MD  Chief Complaint:    Chief Complaint  Patient presents with   Coronary Artery Disease    New patient consultation, CATH 5/2, ECHO 5/2    History of Present Illness:     Allen Tapia 68 y.o. male presents for surgical evaluation of LAD disease.  He has a strong family history of coronary disease.  He describes occasional atypical chest pain that he describes as a mild thumping that comes and goes without provocation.  He remains very active, and does not have any chest pressure, or shortness of breath.  Most of his mobility issues are secondary to his MS and knee surgeries.   Past Medical and Surgical History: Previous Chest Surgery: no Previous Chest Radiation: no Diabetes Mellitus: yes.  HbA1C 6.1 Creatinine:  Lab Results  Component Value Date   CREATININE 0.86 07/24/2023   CREATININE 0.73 08/13/2022   CREATININE 0.81 06/13/2022     Past Medical History:  Diagnosis Date   Anginal pain (HCC)    atypical chest pain cardiac cath performed at that time   Arthritis    Asthma    Atypical chest pain    Barrett's esophagus    Bilateral cataracts    BPH (benign prostatic hyperplasia)    Chicken pox    Chronic constipation    Coronary artery disease 07/14/2007   a.) LHC 07/14/2007: 40% pRCA-1, 40% pRCA-2, 40% dRCA, 20% RPL1, 30% LM, 20% mLCx-1, 40% mLCx-2, 20% OM3-1, 20% OM3-2, 20% pLAD, 40% mLAD-1, 70% mLAD-2, 40% mLAD-3, 30% dLAD, 40% D1, 40% D2 --> med mgmt.   Depression    Foreign body (FB) in soft tissue    Former tobacco use    GERD (gastroesophageal reflux disease)    History of 2019 novel coronavirus disease (COVID-19) 04/07/2019   a.) tested (+) at [facility] 04/16/2019,  05/30/2021; b.) 2 other (+) tests at home (dates unknown)   History of kidney stones    Hyperlipidemia    Hyperplastic colon polyp    Infrarenal abdominal aortic aneurysm (AAA) without rupture (HCC)    a.) MR lumbar spine 12/30/2020: infrarenal saccular AAA measuring 2.2 cm; b.) CTA A/P 01/05/2021: measured 2.6 cm; c.) s/p EVAR 03/03/2021   Ischemic heart disease 12/20/2010   Long term current use of immunosuppressive drug    a.) on ofatumumab for MS   MS (multiple sclerosis) (HCC)    a.) on ofatumumab   Shingles    Tubular adenoma of colon 08/23/2016    Past Surgical History:  Procedure Laterality Date   ABDOMINAL AORTIC ENDOVASCULAR STENT GRAFT N/A 03/03/2021   Procedure: ABDOMINAL AORTIC ENDOVASCULAR STENT GRAFT;  Surgeon: Adine Hoof, MD;  Location: [facility] OR;  Service: Vascular;  Laterality: N/A;   ADENOIDECTOMY  1960   APPENDECTOMY  1979   CATARACT EXTRACTION, BILATERAL  03/12/2019   COLONOSCOPY  02/05/2008   COLONOSCOPY WITH PROPOFOL  N/A 08/23/2016   Procedure: COLONOSCOPY WITH PROPOFOL ;  Surgeon: Deveron Fly, MD;  Location: [facility] ENDOSCOPY;  Service: Endoscopy;  Laterality: N/A;   COLONOSCOPY WITH PROPOFOL  N/A 04/29/2020   Procedure: COLONOSCOPY WITH PROPOFOL ;  Surgeon: Shane Darling, MD;  Location: [facility] ENDOSCOPY;  Service: Endoscopy;  Laterality: N/A;   CYSTOSCOPY WITH INSERTION OF UROLIFT N/A 05/20/2020   Procedure: CYSTOSCOPY WITH INSERTION OF UROLIFT;  Surgeon: Lawerence Pressman, MD;  Location: [facility] ORS;  Service: Urology;  Laterality: N/A;   ESOPHAGOGASTRODUODENOSCOPY (EGD) WITH PROPOFOL  N/A 08/23/2016   Procedure: ESOPHAGOGASTRODUODENOSCOPY (EGD) WITH PROPOFOL ;  Surgeon: Deveron Fly, MD;  Location: [facility] ENDOSCOPY;  Service: Endoscopy;  Laterality: N/A;   ESOPHAGOGASTRODUODENOSCOPY (EGD) WITH PROPOFOL  N/A 04/29/2020   Procedure: ESOPHAGOGASTRODUODENOSCOPY (EGD) WITH PROPOFOL ;  Surgeon: Shane Darling, MD;  Location: [facility] ENDOSCOPY;   Service: Endoscopy;  Laterality: N/A;   ESOPHAGOGASTRODUODENOSCOPY ENDOSCOPY  02/05/2008   EYE SURGERY     eye trauma  1970   piece of metal to eye removed in clinic   JOINT REPLACEMENT     KNEE ARTHROPLASTY Right 10/05/2020   Procedure: COMPUTER ASSISTED TOTAL KNEE ARTHROPLASTY;  Surgeon: Arlyne Lame, MD;  Location: [facility] ORS;  Service: Orthopedics;  Laterality: Right;   KNEE ARTHROPLASTY Left 06/08/2022   Procedure: COMPUTER ASSISTED TOTAL KNEE ARTHROPLASTY;  Surgeon: Arlyne Lame, MD;  Location: [facility] ORS;  Service: Orthopedics;  Laterality: Left;   KNEE ARTHROSCOPY Left 12/11/2021   Procedure: ARTHROSCOPY KNEE;  Surgeon: Arlyne Lame, MD;  Location: [facility] ORS;  Service: Orthopedics;  Laterality: Left;   KNEE ARTHROSCOPY W/ PARTIAL MEDIAL MENISCECTOMY Right 01/02/2011   LEFT HEART CATH AND CORONARY ANGIOGRAPHY Left 07/14/2007   Procedure: LEFT HEART CATH AND CORONARY ANGIOGRAPHY; Location: [facility]; Surgeon: Taffy Fabian, MD   LEFT HEART CATH AND CORONARY ANGIOGRAPHY N/A 07/26/2023   Procedure: LEFT HEART CATH AND CORONARY ANGIOGRAPHY;  Surgeon: Odie Benne, MD;  Location: [facility] INVASIVE CV LAB;  Service: Cardiovascular;  Laterality: N/A;   SEPTOPLASTY     TONSILLECTOMY  02/05/2008   ULTRASOUND GUIDANCE FOR VASCULAR ACCESS Left 03/03/2021   Procedure: ULTRASOUND GUIDANCE FOR VASCULAR ACCESS, LEFT FEMORAL ARTERY;  Surgeon: Adine Hoof, MD;  Location: [facility] OR;  Service: Vascular;  Laterality: Left;   WRIST FRACTURE SURGERY Right     Social History:  Social History   Tobacco Use  Smoking Status Former   Current packs/day: 0.00   Average packs/day: 1 pack/day for 32.5 years (32.5 ttl pk-yrs)   Types: Cigarettes   Start date: 3   Quit date: 09/23/2006   Years since quitting: 16.8  Smokeless Tobacco Never    Social History   Substance and Sexual Activity  Alcohol  Use No     No Known Allergies    Current Outpatient Medications  Medication Sig  Dispense Refill   acetaminophen  (TYLENOL ) 500 MG tablet Take 2 tablets (1,000 mg total) by mouth every 6 (six) hours as needed for mild pain. 60 tablet 0   amLODipine  (NORVASC ) 5 MG tablet Take 1 tablet (5 mg total) by mouth daily. 90 tablet 3   amphetamine -dextroamphetamine (ADDERALL XR) 25 MG 24 hr capsule Take 25 mg by mouth daily as needed (focus).     aspirin  EC 81 MG tablet Take 81 mg by mouth daily. Swallow whole.     baclofen  (LIORESAL ) 20 MG tablet Take 20 mg by mouth 3 (three) times daily.     BREZTRI  AEROSPHERE 160-9-4.8 MCG/ACT AERO USE 2 INHALATIONS IN THE MORNING AND AT BEDTIME 32.1 g 3   celecoxib  (CELEBREX ) 200 MG capsule Take 200 mg by mouth 2 (two) times daily.     gabapentin  (NEURONTIN ) 300 MG capsule Take 1-2 capsules (300-600 mg total) by mouth 2 (two) times daily.  600 mg in am and 300 mg in afternoon (Patient taking differently: Take 600 mg by mouth 2 (two) times daily. 600 in the am and 600 in the pm) 60 capsule 0   isosorbide  mononitrate (IMDUR ) 30 MG 24 hr tablet Take 1 tablet (30 mg total) by mouth daily. (Patient taking differently: Take 15 mg by mouth daily.) 90 tablet 3   montelukast  (SINGULAIR ) 10 MG tablet TAKE 1 TABLET AT BEDTIME 90 tablet 3   Multiple Vitamins-Minerals (MULTIVITAMIN WITH MINERALS) tablet Take 1 tablet by mouth daily.     Ofatumumab (KESIMPTA) 20 MG/0.4ML SOAJ Inject 20 mg into the skin every 30 (thirty) days. 1st day of every month     pantoprazole  (PROTONIX ) 20 MG tablet Take 20 mg by mouth at bedtime.     Polyethyl Glycol-Propyl Glycol (SYSTANE) 0.4-0.3 % SOLN Apply 1 drop to eye 2 (two) times daily.     rosuvastatin  (CRESTOR ) 20 MG tablet TAKE 1 TABLET AT BEDTIME 90 tablet 3   Spacer/Aero-Holding Chambers (AEROCHAMBER MV) inhaler Use as instructed 1 each 0   tamsulosin  (FLOMAX ) 0.4 MG CAPS capsule TAKE 1 CAPSULE DAILY 90 capsule 3   traZODone  (DESYREL ) 100 MG tablet Take 100 mg by mouth at bedtime.     VENTOLIN  HFA 108 (90 Base) MCG/ACT inhaler  USE 2 INHALATIONS EVERY 6 HOURS AS NEEDED FOR WHEEZING OR SHORTNESS OF BREATH 54 g 3   Vibegron  (GEMTESA ) 75 MG TABS Take 1 tablet (75 mg total) by mouth daily. 90 tablet 3   VITAMIN D  PO Take 1 capsule by mouth daily.     No current facility-administered medications for this visit.    (Not in a hospital admission)   Family History  Problem Relation Age of Onset   Heart disease Mother    Glaucoma Mother    Irregular heart beat Mother    Cardiomyopathy Mother    Colon polyps Mother    Cataracts Mother    Hypertension Father    Heart disease Father    Cancer Father    Esophageal cancer Cousin      Review of Systems:   Review of Systems  Constitutional:  Negative for malaise/fatigue and weight loss.  Respiratory:  Negative for shortness of breath.   Cardiovascular:  Positive for chest pain.      Physical Exam: BP 109/67 (BP Location: Right Arm, Patient Position: Sitting, Cuff Size: Normal)   Pulse 74   Resp 20   Ht 6' (1.829 m)   Wt 199 lb (90.3 kg)   SpO2 94% Comment: RA  BMI 26.99 kg/m  Physical Exam Constitutional:      General: He is not in acute distress. HENT:     Head: Normocephalic and atraumatic.  Eyes:     Extraocular Movements: Extraocular movements intact.  Cardiovascular:     Rate and Rhythm: Normal rate.  Pulmonary:     Effort: Pulmonary effort is normal. No respiratory distress.  Abdominal:     General: There is no distension.  Musculoskeletal:        General: Normal range of motion.     Cervical back: Normal range of motion.  Skin:    General: Skin is warm and dry.  Neurological:     General: No focal deficit present.     Mental Status: He is alert and oriented to person, place, and time.       Diagnostic Studies & Laboratory data:    Left Heart Catherization:  Intervention  Echo: IMPRESSIONS  1. Left ventricular ejection fraction, by estimation, is 60 to 65%. Left  ventricular ejection fraction by 3D volume is 62 %. The  left ventricle has  normal function. The left ventricle has no regional wall motion  abnormalities. Left ventricular diastolic   parameters are consistent with Grade I diastolic dysfunction (impaired  relaxation). The average left ventricular global longitudinal strain is  -26.7 %. The global longitudinal strain is normal.   2. Right ventricular systolic function is normal. The right ventricular  size is normal.   3. The mitral valve is normal in structure. Mild mitral valve  regurgitation. No evidence of mitral stenosis.   4. The aortic valve is tricuspid. Aortic valve regurgitation is mild. No  aortic stenosis is present.   5. Aortic dilatation noted. There is mild dilatation of the aortic root,  measuring 45 mm. There is mild dilatation of the ascending aorta,  measuring 43 mm.   6. The inferior vena cava is normal in size with greater than 50%  respiratory variability, suggesting right atrial pressure of 3 mmHg.     EKG: Sinus  I have independently reviewed the above radiologic studies and discussed with the patient   Recent Lab Findings: Lab Results  Component Value Date   WBC 5.8 07/24/2023   HGB 14.1 07/24/2023   HCT 41.2 07/24/2023   PLT 217 07/24/2023   GLUCOSE 91 07/24/2023   ALT 24 06/06/2022   AST 27 06/06/2022   NA 143 07/24/2023   K 4.2 07/24/2023   CL 105 07/24/2023   CREATININE 0.86 07/24/2023   BUN 14 07/24/2023   CO2 22 07/24/2023   INR 0.9 03/03/2021   HGBA1C 6.1 (H) 06/06/2022      Assessment / Plan:   68yo male with complex LAD disease.  Echocardiogram shows preserved biventricular function and no significant valvular disease.  His case was discussed in Cath conference.  Due to concerns for competitive flow if the diagonal vessel is stented, he would not be a candidate for a hybrid MIDCAB.  We will proceed with an off-pump CABG 2.    I  spent 40 minutes counseling the patient face to face.   Hilarie Lovely 08/02/2023 12:44 PM

## 2023-08-02 ENCOUNTER — Other Ambulatory Visit: Payer: Self-pay | Admitting: *Deleted

## 2023-08-02 ENCOUNTER — Ambulatory Visit
Attending: Thoracic Surgery (Cardiothoracic Vascular Surgery) | Admitting: Thoracic Surgery (Cardiothoracic Vascular Surgery)

## 2023-08-02 ENCOUNTER — Encounter: Payer: Self-pay | Admitting: *Deleted

## 2023-08-02 ENCOUNTER — Encounter: Payer: Self-pay | Admitting: Thoracic Surgery (Cardiothoracic Vascular Surgery)

## 2023-08-02 ENCOUNTER — Ambulatory Visit: Payer: Self-pay | Admitting: Emergency Medicine

## 2023-08-02 VITALS — BP 109/67 | HR 74 | Resp 20 | Ht 72.0 in | Wt 199.0 lb

## 2023-08-02 DIAGNOSIS — I2510 Atherosclerotic heart disease of native coronary artery without angina pectoris: Secondary | ICD-10-CM

## 2023-08-02 DIAGNOSIS — I2585 Chronic coronary microvascular dysfunction: Secondary | ICD-10-CM

## 2023-08-02 DIAGNOSIS — R0789 Other chest pain: Secondary | ICD-10-CM

## 2023-08-02 NOTE — Heart Team MDD (Signed)
   Heart Team Multi-Disciplinary Discussion  Patient: Allen Tapia  DOB: [birth year]  MRN: [id number]   Date: 08/02/2023  11:24 AM    Attendees: Interventional Cardiology: Randene Bustard, MD Alyssa Backbone, MD Fransico Ivy, MD Antionette Kirks, MD Sammy Crisp, MD Burney Carter, MD  Cardiothoracic Surgery: Starleen Eastern, MD     Patient History: 68 y.o. male who c/o chest pain and elevated BP. He reports continued chest discomfort particularly in two locations, mid sternal and left breast. Pain midsternal to left side with occasional radiation down his left arm. He feels pain most frequently at rest. He denies shortness of breath, palpitations, orthopnea, PND, edema, presyncope or syncope. Family history of father and brother having CABG in the past. Discussed at heart team meeting for open CABG versus hybrid with PCI.        Risk Factors: Hypertension Tobacco Abuse Hyperlipidemia  MS, EVAR to saccular aneurysm of infrarenal abdominal aorta 02/2021.  Ascending thoracic aortic aneurysm, 4.2 cm on CT 06/06/2023    CAD History: Prior LHC at [facility] 06/2007 with obstructive LAD only, not intervened on as no matching ischemia on stress testing/atypical symptoms, LVEF 65%.  Other vessels with nonobstructive disease. Cardiac PET 06/06/23 revealed small area of apical ischemia but otherwise low risk with normal heart function.     Review of Prior Angiography and PCI Procedures: The left heart cath and coronary angiography from 07/26/2023 images were reviewed and discussed in detail including: Severe complex lesion involving the proximal to mid LAD and a large caliber Diagonal branch. The LAD lesion is calcified and severely stenosed. The Large Diagonal has ostial disease. The LAD beyond the Diagonal branch has a sharp turn and then an aneurysmal segment. No disease in the Circumflex. Mild non-obstructive disease in the RCA.     Discussion: After presentation,  consideration of treatment options occurred contrasting open/standard CABG (open LIMA to LAD and vein to diag) versus LIMA to LAD and PCI for diag lesion. After thorough discussion, and the complex/calcified disease, the team consensus was for open CABG for both vessels as the vessels do not appear favorable to bifurcation stenting and the risks would outweigh the benefits of a hybrid approach.    Recommendations: CABG      Andreas Kays, RN  08/02/2023 11:24 AM

## 2023-08-22 ENCOUNTER — Encounter: Payer: Self-pay | Admitting: Pulmonary Disease

## 2023-08-22 MED ORDER — VENTOLIN HFA 108 (90 BASE) MCG/ACT IN AERS: 2.0000 | INHALATION_SPRAY | RESPIRATORY_TRACT | 4 refills | Status: AC | PRN

## 2023-08-22 NOTE — Telephone Encounter (Signed)
 New script sent

## 2023-08-22 NOTE — Telephone Encounter (Signed)
**Note De-identified  Woolbright Obfuscation** Please advise 

## 2023-08-22 NOTE — Telephone Encounter (Signed)
 Patient informed. NFN

## 2023-09-09 IMAGING — CT CT CTA ABD/PEL W/CM AND/OR W/O CM
2 of 10 series · 11 of 46 positions shown, 13 images · IV contrast (iopamidol)
Comparison: CT AP, 01/05/2021.  US Abdomen, 07/18/2007.

CLINICAL DATA: Aortic aneurysm (AAA), surveillance

EXAM:
CTA ABDOMEN AND PELVIS WITHOUT AND WITH CONTRAST
TECHNIQUE: Multidetector CT imaging of the abdomen and pelvis was performed
using the standard protocol during bolus administration of
intravenous contrast. Multiplanar reconstructed images and MIPs were
obtained and reviewed to evaluate the vascular anatomy.
CONTRAST:  75mL 8IZYU5-V2A IOPAMIDOL (8IZYU5-V2A) INJECTION 76%

[Series 8: cta arterial 2.00 bv36 s3 cor art st · axial · arterial · 0.61mm/px · z∈[+1224,+1612]mm · 9 of 232 slices shown, 11 images (1 of 2)]
[im 25/232  soft-tissue]
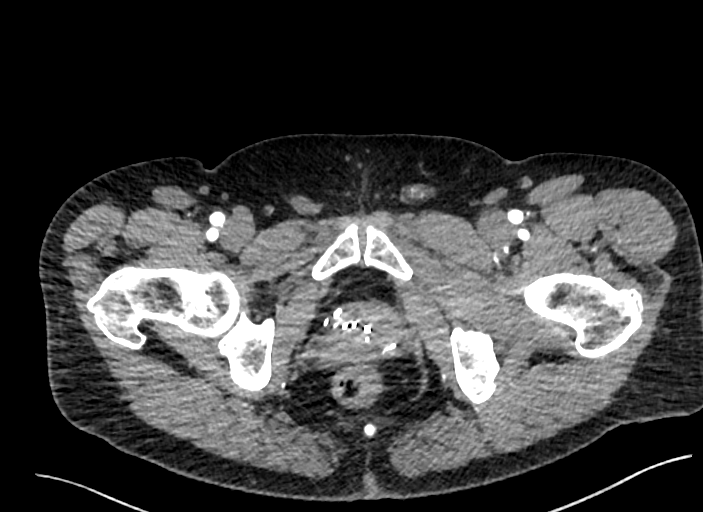
[im 25/232  bone]
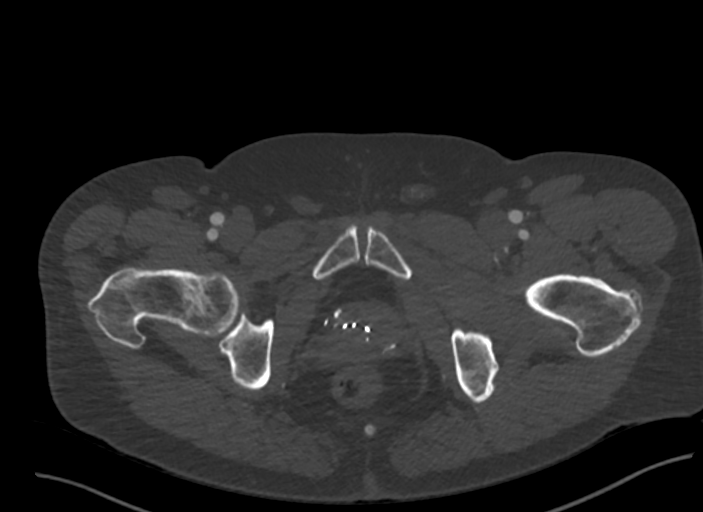
[im 49/232  soft-tissue]
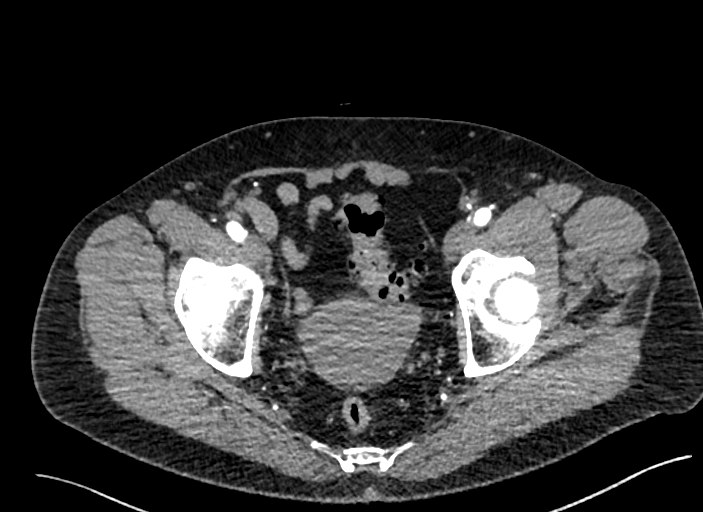
[im 73/232  soft-tissue]
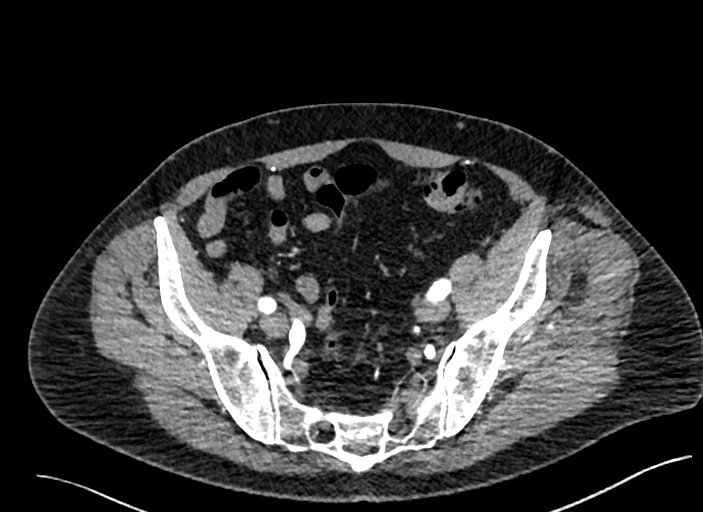
[im 98/232  soft-tissue]
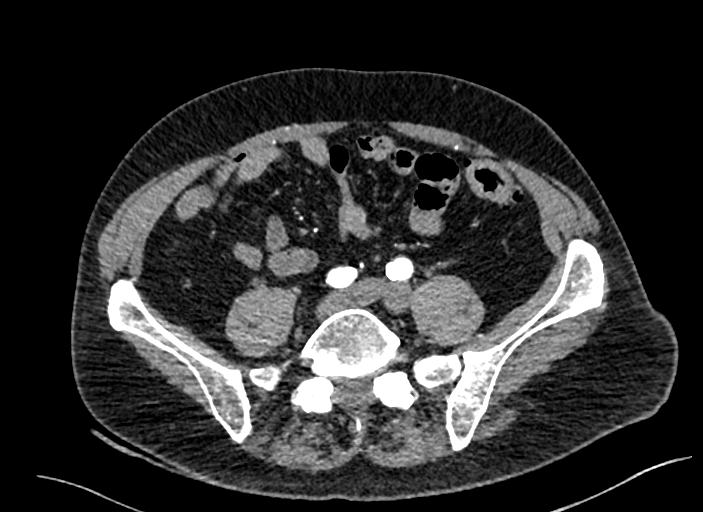
[im 122/232  soft-tissue]
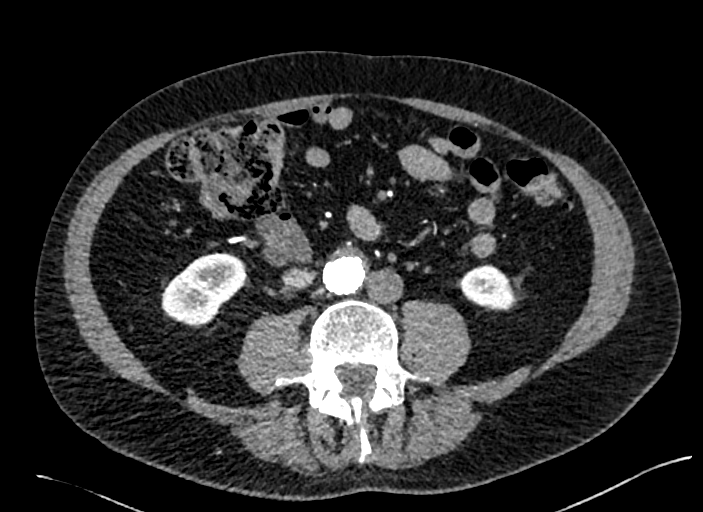
[im 146/232  soft-tissue]
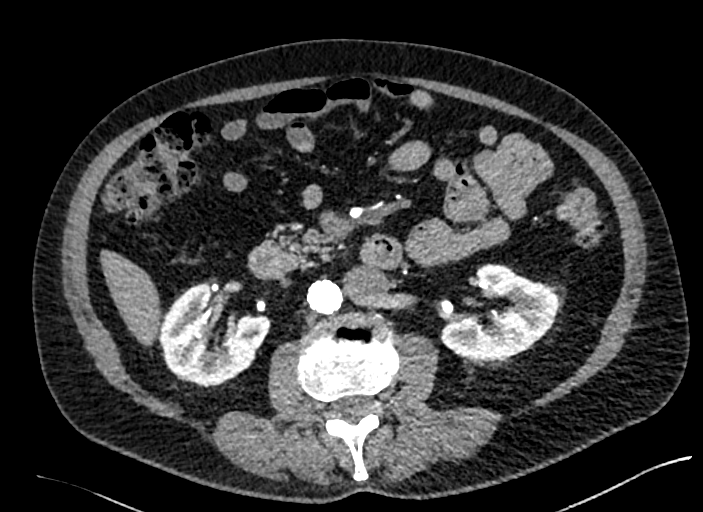
[im 171/232  soft-tissue]
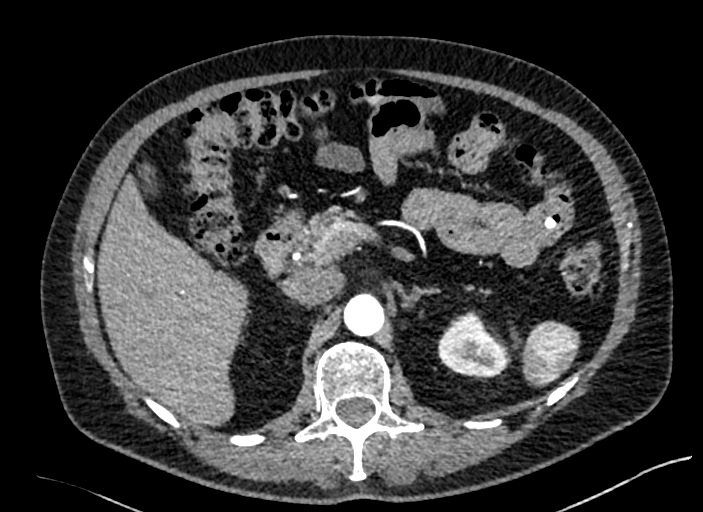
[im 195/232  soft-tissue]
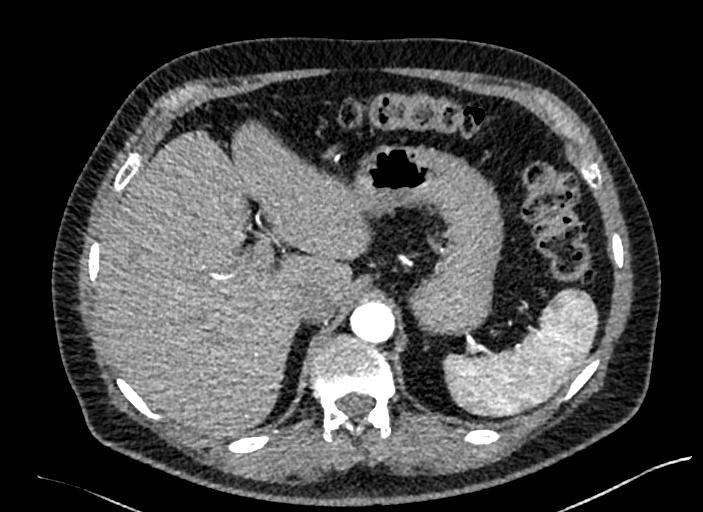
[im 219/232  soft-tissue]
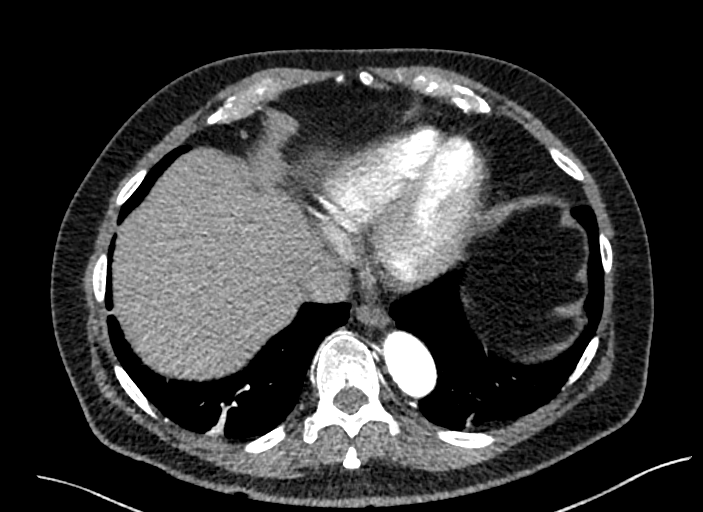
[im 219/232  bone]
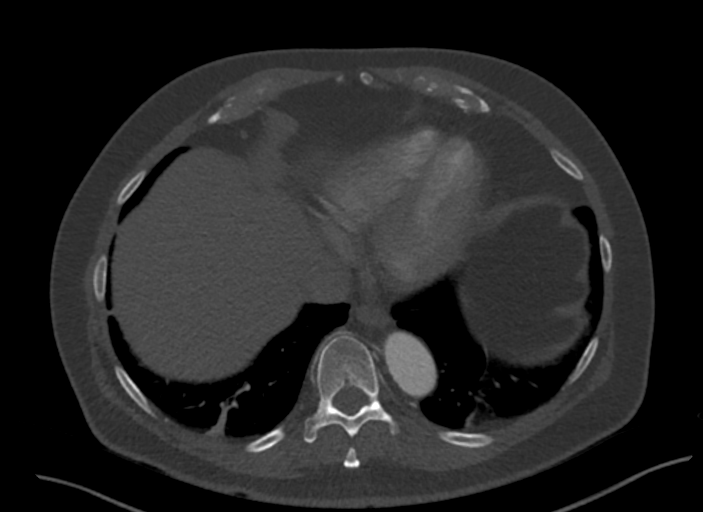

[Series 10: cta arterial 2.00 bv36 s3 cor art st · coronal · arterial · 0.84mm/px · 2 of 157 slices shown (2 of 2)]
[im 53/157  soft-tissue]
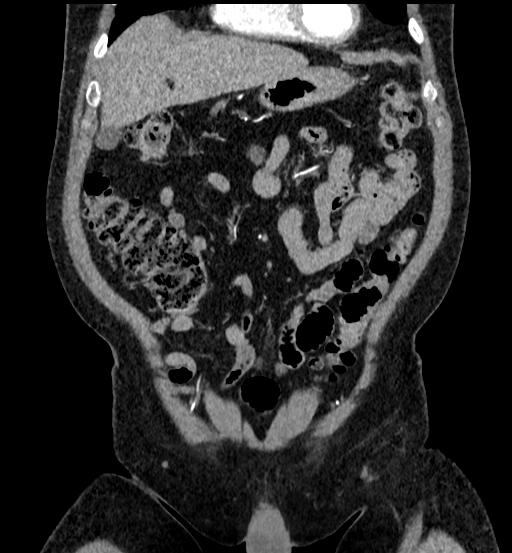
[im 105/157  soft-tissue]
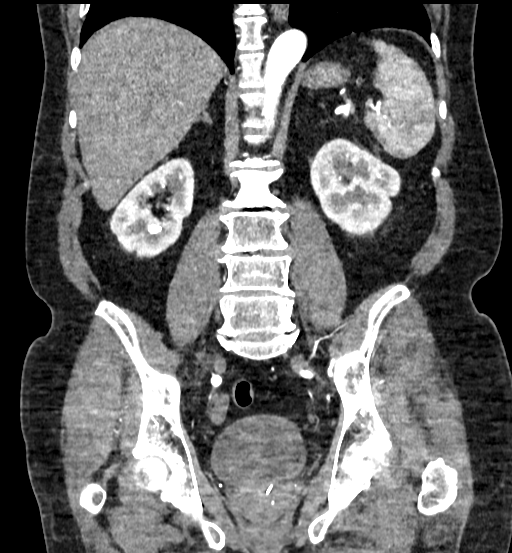

[11 of 46 positions shown; findings below may reference images not displayed]

FINDINGS: VASCULAR

Aorta:

*Endovascular stent graft repair (AUAD) of prior saccular infrarenal
abdominal aortic aneurysm.
*Well-apposed stent graft at proximal and distal attachments. No
iliac graft limbs.
*Excluded LEFT eccentric infrarenal saccular aneurysm, with
decreased excluded sac diameter and no evidence of early or delayed
enhancement.

Celiac: Widely patent without evidence of dissection, vasculitis or
significant stenosis. 1.0 cm partially-calcified splenic artery
aneurysm.

SMA: Widely patent without evidence of aneurysm, dissection,
vasculitis or significant stenosis.

Renals: Single renal arteries are present bilaterally. Small burden
of bilateral renal artery ostial calcified atherosclerosis.

Both renal arteries are otherwise patent without evidence of
aneurysm, dissection, vasculitis, fibromuscular dysplasia or
significant stenosis.

IMA: Occluded at its origin. Retrograde opacification of distal
branches, likely via collateral circulation. Ectatic proximal IMA
bifurcation.

Inflow: Patent without evidence of aneurysm, dissection, vasculitis
or significant stenosis.

Proximal Outflow: Bilateral common femoral and visualized portions
of the superficial and profunda femoral arteries are patent without
evidence of aneurysm, dissection, vasculitis or significant
stenosis.

Veins: Variant venous anatomy with LEFT-sided IVC, draining into the
LEFT renal vein.

Review of the MIP images confirms the above findings.

NON-VASCULAR

Lower chest: Trace bibasilar atelectasis.

Hepatobiliary: No focal liver abnormality is seen. No gallstones,
gallbladder wall thickening, or biliary dilatation.

Pancreas: No pancreatic ductal dilatation or surrounding
inflammatory changes.

Spleen: Normal in size without focal abnormality.

Adrenals/Urinary Tract: Adrenal glands are unremarkable. Bilateral
exophytic renal cysts, measuring 2.1 cm RIGHT and 2.8 cm LEFT. The
kidneys are otherwise normal, without renal calculi or
hydronephrosis. Bladder is unremarkable.

Stomach/Bowel: Stomach is within normal limits. Appendix is not
definitively visualized. Moderate burden of sigmoid diverticulosis.
No evidence of bowel wall thickening, distention, or inflammatory
changes.

Lymphatic: No enlarged abdominal or pelvic lymph nodes.

Reproductive: Prostatic brachytherapy.

Other: No abdominal wall hernia or abnormality. No abdominopelvic
ascites.

Musculoskeletal: Degenerative changes of the spine, including L3
superior endplate Schmorl's nodes, osteophytes and 5 mm
retrolisthesis of L3 on L4. No acute osseous findings.
IMPRESSION: Since CTA AP dated [DATE];

VASCULAR

1. Interval endograft repair of saccular infrarenal abdominal aortic
aneurysm.
2. Decreased excluded sac diameter without evidence of early or
delayed enhancement.

NON-VASCULAR

1. No acute abdominopelvic findings.
2. Moderate burden of sigmoid diverticulosis. Additional incidental
chronic and senescent findings, as above.

## 2023-09-11 ENCOUNTER — Encounter (HOSPITAL_COMMUNITY): Payer: Self-pay

## 2023-09-11 NOTE — Progress Notes (Signed)
 PCP - Dr Nestor Banter Cardiologist - Dr Sheryle Donning  Chest x-ray - 09/12/23 EKG - 09/12/23 Stress Test - 06/06/23 ECHO - 07/26/23 Cardiac Cath - 07/26/23  ICD Pacemaker/Loop - n/a  Sleep Study -  n/a CPAP - none  Diabetes - n/a  Blood Thinner Instructions:  n/a  Aspirin  Instructions: Follow your surgeon's instructions on when to stop aspirin  prior to surgery,  If no instructions were given by your surgeon then you will need to call the office for those instructions.  NPO   Anesthesia review: Yes  STOP now taking any Aspirin  (unless otherwise instructed by your surgeon), Aleve, Naproxen, Ibuprofen, Motrin, Advil, Goody's, BC's, all herbal medications, fish oil, and all vitamins.   Coronavirus Screening Do you have any of the following symptoms:  Cough yes/no: No Fever (>100.40F)  yes/no: No Runny nose yes/no: No Sore throat yes/no: No Difficulty breathing/shortness of breath  yes/no: No  Have you traveled in the last 14 days and where? yes/no: No  Patient verbalized understanding of instructions that were given to them at the PAT appointment. Patient was also instructed that they will need to review over the PAT instructions again at home before surgery.

## 2023-09-11 NOTE — Progress Notes (Signed)
 Surgical Instructions    Your procedure is scheduled on Monday, 09/16/23.  Report to [facility] Main Entrance A at 5:30 A.M., then check in with the Admitting office.  Call this number if you have problems the morning of surgery:  [phone]   If you have any questions prior to your surgery date call [phone]: Open Monday-Friday 8am-4pm If you experience any cold or flu symptoms such as cough, fever, chills, shortness of breath, etc. between now and your scheduled surgery, please notify us  at the above number     Remember:  Do not eat or drink after midnight the night before your surgery-Sunday    Take these medicines the morning of surgery with A SIP OF WATER:  amLODipine  (NORVASC )  baclofen  (LIORESAL )  BREZTRI  INHALER buPROPion  (WELLBUTRIN  XL) gabapentin  (NEURONTIN )  isosorbide  mononitrate (IMDUR )  Vibegron  (GEMTESA )    IF NEEDED: acetaminophen  (TYLENOL )  SYSTANE EYE DROPS VENTOLIN  HFA INHALER   As of today, STOP taking any Aspirin  (unless otherwise instructed by your surgeon) Aleve, Naproxen, Celebrex , Ibuprofen, Motrin, Advil, Goody's, BC's, all herbal medications, fish oil, and all vitamins.           Do not wear jewelry  Do not wear lotions, powders, cologne or deodorant. Men may shave face and neck. Do not bring valuables to the hospital.  [facility] is not responsible for any belongings or valuables.    Do NOT Smoke (Tobacco/Vaping)  24 hours prior to your procedure  If you use a CPAP at night, you may bring your mask for your overnight stay.   Contacts, glasses, hearing aids, dentures or partials may not be worn into surgery, please bring cases for these belongings   For patients admitted to the hospital, discharge time will be determined by your treatment team.   Patients discharged the day of surgery will not be allowed to drive home, and someone needs to stay with them for 24 hours.   SURGICAL WAITING ROOM VISITATION Patients having surgery or  a procedure may have no more than 2 support people in the waiting area - these visitors may rotate.   Children under the age of 22 must have an adult with them who is not the patient. If the patient needs to stay at the hospital during part of their recovery, the visitor guidelines for inpatient rooms apply. Pre-op nurse will coordinate an appropriate time for 1 support person to accompany patient in pre-op.  This support person may not rotate.   Please refer to [email or web address] for the visitor guidelines for Inpatients (after your surgery is over and you are in a regular room).    Special instructions:    Oral Hygiene is also important to reduce your risk of infection.  Remember - BRUSH YOUR TEETH THE MORNING OF SURGERY WITH YOUR REGULAR TOOTHPASTE   [place]- Preparing For Surgery  Before surgery, you can play an important role. Because skin is not sterile, your skin needs to be as free of germs as possible. You can reduce the number of germs on your skin by washing with CHG (chlorahexidine gluconate) Soap before surgery.  CHG is an antiseptic cleaner which kills germs and bonds with the skin to continue killing germs even after washing.     Please do not use if you have an allergy to CHG or antibacterial soaps. If your skin becomes reddened/irritated stop using the CHG.  Do not shave (including legs and underarms) for at least 48 hours prior to first CHG shower. It is  OK to shave your face.  Please follow these instructions carefully.     Shower the NIGHT BEFORE SURGERY-SUN and the MORNING OF SURGERY-MON with CHG Soap.   If you chose to wash your hair, wash your hair first as usual with your normal shampoo. After you shampoo, rinse your hair and body thoroughly to remove the shampoo.  Then [employer] and genitals (private parts) with your normal soap and rinse thoroughly to remove soap.  After that Use CHG Soap as you would any  other liquid soap. You can apply CHG directly to the skin and wash gently with a scrungie or a clean washcloth.   Apply the CHG Soap to your body ONLY FROM THE NECK DOWN.  Do not use on open wounds or open sores. Avoid contact with your eyes, ears, mouth and genitals (private parts). Wash Face and genitals (private parts)  with your normal soap.   Wash thoroughly, paying special attention to the area where your surgery will be performed.  Thoroughly rinse your body with warm water from the neck down.  DO NOT shower/wash with your normal soap after using and rinsing off the CHG Soap.  Pat yourself dry with a CLEAN TOWEL.  Wear CLEAN PAJAMAS to bed the night before surgery  Place CLEAN SHEETS on your bed the night before your surgery  DO NOT SLEEP WITH PETS.   Day of Surgery:  Take a shower with CHG soap. Wear Clean/Comfortable clothing the morning of surgery Do not apply any deodorants/lotions.   Remember to brush your teeth WITH YOUR REGULAR TOOTHPASTE.    If you received a COVID test during your pre-op visit, it is requested that you wear a mask when out in public, stay away from anyone that may not be feeling well, and notify your surgeon if you develop symptoms. If you have been in contact with anyone that has tested positive in the last 10 days, please notify your surgeon.    Please read over the following fact sheets that you were given.

## 2023-09-12 ENCOUNTER — Other Ambulatory Visit: Payer: Self-pay

## 2023-09-12 ENCOUNTER — Ambulatory Visit (HOSPITAL_COMMUNITY)
Admission: RE | Admit: 2023-09-12 | Discharge: 2023-09-12 | Disposition: A | Discharge status: Home / Self Care | Source: Ambulatory Visit | Attending: Thoracic Surgery (Cardiothoracic Vascular Surgery) | Admitting: Thoracic Surgery (Cardiothoracic Vascular Surgery)

## 2023-09-12 ENCOUNTER — Encounter (HOSPITAL_COMMUNITY)
Admission: RE | Admit: 2023-09-12 | Discharge: 2023-09-12 | Disposition: A | Discharge status: Home / Self Care | Source: Ambulatory Visit | Attending: Thoracic Surgery (Cardiothoracic Vascular Surgery) | Admitting: Thoracic Surgery (Cardiothoracic Vascular Surgery)

## 2023-09-12 ENCOUNTER — Encounter (HOSPITAL_COMMUNITY): Payer: Self-pay

## 2023-09-12 VITALS — BP 125/82 | HR 57 | Temp 98.5°F | Resp 18 | Ht 72.0 in | Wt 204.7 lb

## 2023-09-12 DIAGNOSIS — R0789 Other chest pain: Secondary | ICD-10-CM

## 2023-09-12 DIAGNOSIS — Z01818 Encounter for other preprocedural examination: Secondary | ICD-10-CM

## 2023-09-12 DIAGNOSIS — I2585 Chronic coronary microvascular dysfunction: Secondary | ICD-10-CM | POA: Insufficient documentation

## 2023-09-12 DIAGNOSIS — I2510 Atherosclerotic heart disease of native coronary artery without angina pectoris: Secondary | ICD-10-CM

## 2023-09-12 HISTORY — DX: Pneumonia, unspecified organism: J18.9

## 2023-09-12 HISTORY — DX: Prediabetes: R73.03

## 2023-09-12 HISTORY — DX: Chronic obstructive pulmonary disease, unspecified: J44.9

## 2023-09-12 HISTORY — DX: Malignant (primary) neoplasm, unspecified: C80.1

## 2023-09-12 HISTORY — DX: Essential (primary) hypertension: I10

## 2023-09-12 HISTORY — DX: Myoneural disorder, unspecified: G70.9

## 2023-09-12 LAB — CBC
HCT: 44.9 % (ref 39.0–52.0)
Hemoglobin: 14.6 g/dL (ref 13.0–17.0)
MCH: 31.1 pg (ref 26.0–34.0)
MCHC: 32.5 g/dL (ref 30.0–36.0)
MCV: 95.7 fL (ref 80.0–100.0)
Platelets: 231 10*3/uL (ref 150–400)
RBC: 4.69 MIL/uL (ref 4.22–5.81)
RDW: 13.7 % (ref 11.5–15.5)
WBC: 5.3 10*3/uL (ref 4.0–10.5)
nRBC: 0 % (ref 0.0–0.2)

## 2023-09-12 LAB — PROTIME-INR
INR: 0.9 (ref 0.8–1.2)
Prothrombin Time: 12.3 s (ref 11.4–15.2)

## 2023-09-12 LAB — APTT: aPTT: 32 s (ref 24–36)

## 2023-09-12 LAB — URINALYSIS, ROUTINE W REFLEX MICROSCOPIC
Bilirubin Urine: NEGATIVE
Glucose, UA: NEGATIVE mg/dL
Hgb urine dipstick: NEGATIVE
Ketones, ur: NEGATIVE mg/dL
Leukocytes,Ua: NEGATIVE
Nitrite: NEGATIVE
Protein, ur: NEGATIVE mg/dL
Specific Gravity, Urine: 1.008 (ref 1.005–1.030)
pH: 6 (ref 5.0–8.0)

## 2023-09-12 LAB — SURGICAL PCR SCREEN
MRSA, PCR: NEGATIVE
Staphylococcus aureus: POSITIVE — AB

## 2023-09-12 LAB — TYPE AND SCREEN
ABO/RH(D): A NEG
Antibody Screen: NEGATIVE

## 2023-09-12 LAB — COMPREHENSIVE METABOLIC PANEL WITH GFR
ALT: 29 U/L (ref 0–44)
AST: 30 U/L (ref 15–41)
Albumin: 4 g/dL (ref 3.5–5.0)
Alkaline Phosphatase: 60 U/L (ref 38–126)
Anion gap: 6 (ref 5–15)
BUN: 8 mg/dL (ref 8–23)
CO2: 32 mmol/L (ref 22–32)
Calcium: 9.1 mg/dL (ref 8.9–10.3)
Chloride: 104 mmol/L (ref 98–111)
Creatinine, Ser: 0.81 mg/dL (ref 0.61–1.24)
GFR, Estimated: >60 mL/min (ref >60)
Glucose, Bld: 110 mg/dL — ABNORMAL HIGH (ref 70–99)
Potassium: 4.3 mmol/L (ref 3.5–5.1)
Sodium: 142 mmol/L (ref 135–145)
Total Bilirubin: 0.6 mg/dL (ref 0.0–1.2)
Total Protein: 6.4 g/dL — ABNORMAL LOW (ref 6.5–8.1)

## 2023-09-12 LAB — HEMOGLOBIN A1C
Hgb A1c MFr Bld: 5.8 % — ABNORMAL HIGH (ref 4.8–5.6)
Mean Plasma Glucose: 119.76 mg/dL

## 2023-09-13 MED ORDER — TRANEXAMIC ACID (OHS) PUMP PRIME SOLUTION
2.0000 mg/kg | INTRAVENOUS | Status: DC
  Filled 2023-09-13: qty 1.86

## 2023-09-13 MED ORDER — EPINEPHRINE HCL 5 MG/250ML IV SOLN IN NS
0.0000 ug/min | INTRAVENOUS | Status: AC
  Administered 2023-09-16: 1 ug/min via INTRAVENOUS
  Filled 2023-09-13: qty 250

## 2023-09-13 MED ORDER — PLASMA-LYTE A IV SOLN
INTRAVENOUS | Status: DC
  Filled 2023-09-13: qty 2.5

## 2023-09-13 MED ORDER — CEFAZOLIN SODIUM-DEXTROSE 2-4 GM/100ML-% IV SOLN
2.0000 g | INTRAVENOUS | Status: AC
  Administered 2023-09-16: 2 g via INTRAVENOUS
  Filled 2023-09-13: qty 100

## 2023-09-13 MED ORDER — POTASSIUM CHLORIDE 2 MEQ/ML IV SOLN
80.0000 meq | INTRAVENOUS | Status: DC
  Filled 2023-09-13: qty 40

## 2023-09-13 MED ORDER — VANCOMYCIN HCL 1.5 G IV SOLR
1500.0000 mg | INTRAVENOUS | Status: AC
  Administered 2023-09-16: 1500 mg via INTRAVENOUS
  Filled 2023-09-13: qty 30

## 2023-09-13 MED ORDER — HEPARIN 30,000 UNITS/1000 ML (OHS) CELLSAVER SOLUTION
Status: DC
  Filled 2023-09-13: qty 1000

## 2023-09-13 MED ORDER — DEXMEDETOMIDINE HCL IN NACL 400 MCG/100ML IV SOLN
0.1000 ug/kg/h | INTRAVENOUS | Status: AC
  Administered 2023-09-16: .3 ug/kg/h via INTRAVENOUS
  Filled 2023-09-13: qty 100

## 2023-09-13 MED ORDER — NOREPINEPHRINE 4 MG/250ML-% IV SOLN
0.0000 ug/min | INTRAVENOUS | Status: AC
  Administered 2023-09-16: 2 ug/min via INTRAVENOUS
  Filled 2023-09-13: qty 250

## 2023-09-13 MED ORDER — PHENYLEPHRINE HCL-NACL 20-0.9 MG/250ML-% IV SOLN
30.0000 ug/min | INTRAVENOUS | Status: AC
  Administered 2023-09-16: 30 ug/min via INTRAVENOUS
  Filled 2023-09-13: qty 250

## 2023-09-13 MED ORDER — MANNITOL 20 % IV SOLN
INTRAVENOUS | Status: DC
  Filled 2023-09-13: qty 13

## 2023-09-13 MED ORDER — MILRINONE LACTATE IN DEXTROSE 20-5 MG/100ML-% IV SOLN
0.3000 ug/kg/min | INTRAVENOUS | Status: DC
  Filled 2023-09-13: qty 100

## 2023-09-13 MED ORDER — NITROGLYCERIN IN D5W 200-5 MCG/ML-% IV SOLN
2.0000 ug/min | INTRAVENOUS | Status: DC
  Filled 2023-09-13: qty 250

## 2023-09-13 MED ORDER — INSULIN REGULAR(HUMAN) IN NACL 100-0.9 UT/100ML-% IV SOLN
INTRAVENOUS | Status: AC
  Administered 2023-09-16: 1.3 [IU]/h via INTRAVENOUS
  Filled 2023-09-13: qty 100

## 2023-09-13 MED ORDER — TRANEXAMIC ACID (OHS) BOLUS VIA INFUSION
15.0000 mg/kg | INTRAVENOUS | Status: AC
  Administered 2023-09-16: 1393.5 mg via INTRAVENOUS
  Filled 2023-09-13: qty 1394

## 2023-09-13 MED ORDER — TRANEXAMIC ACID 1000 MG/10ML IV SOLN
1.5000 mg/kg/h | INTRAVENOUS | Status: AC
  Administered 2023-09-16: 1.5 mg/kg/h via INTRAVENOUS
  Filled 2023-09-13: qty 25

## 2023-09-13 NOTE — Progress Notes (Signed)
 Anesthesia Chart Review:  Case: [id number] Date/Time: 09/16/23 0715   Procedures:      OFF PUMP CORONARY ARTERY BYPASS GRAFTING (CABG) (Chest)     ECHOCARDIOGRAM, TRANSESOPHAGEAL   Anesthesia type: General   Diagnosis: Coronary artery disease involving native coronary artery of native heart with angina pectoris (HCC) [I25.119]   Pre-op diagnosis: CAD   [facility] / [facility]   Surgeons: Hilarie Lovely, MD       DISCUSSION: Patient is a 68 year old male scheduled for the above procedure. He has complex LAD disease and 70% D1 & D2 lesions. Normal BiV function on TTE. Per Dr. Raina Bunting note, His case was discussed in Cath conference.  Due to concerns for competitive flow if the diagonal vessel is stented, he would not be a candidate for a hybrid MIDCAB.  We will proceed with an off-pump CABG 2.  History includes former smoker (quit 08/2006), COPD, asthma, Multiple Sclerosis (onset symptoms 2009, diagnosis 2012, most recent relapse 2014), CAD, HTN, HLD, prediabetes, AAA (s/p EVAR 03/03/21), GERD, BPH (s/p Urolift 5 implants 05/20/20), osteoarthritis (right TKA 10/05/20; left TKA 06/08/22), skin cancer, PNA (07/2022 & 02/2023).  He is on Breztri , Singulair , and Ventolin  HFA for COPD and asthma. He is on Gemtesa  and Kesimpta for MS.  Anesthesia team to evaluate on the day of surgery.    VS: BP 125/82   Pulse (!) 57   Temp 36.9 C   Resp 18   Ht 6' (1.829 m)   Wt 92.9 kg   SpO2 96%   BMI 27.76 kg/m   PROVIDERS: Nestor Banter, MD is PCP  Sheryle Donning, MD is cardiologist Orbie Binder, MD is pulmonologist Sandor Crosser, MD is neurologist, last visit 08/21/23. No changes in MS therapy made. APP follow-up in 6 months planned.   Jay Meth, MD is urologist  LABS: Labs reviewed: Acceptable for surgery. (all labs ordered are listed, but only abnormal results are displayed)  Labs Reviewed  SURGICAL PCR SCREEN - Abnormal; Notable for the following components:       Result Value   Staphylococcus aureus POSITIVE (*)    All other components within normal limits  COMPREHENSIVE METABOLIC PANEL WITH GFR - Abnormal; Notable for the following components:   Glucose, Bld 110 (*)    Total Protein 6.4 (*)    All other components within normal limits  HEMOGLOBIN A1C - Abnormal; Notable for the following components:   Hgb A1c MFr Bld 5.8 (*)    All other components within normal limits  CBC  PROTIME-INR  APTT  URINALYSIS, ROUTINE W REFLEX MICROSCOPIC  TYPE AND SCREEN     IMAGES: CXR 09/12/23: FINDINGS: - Cardiac silhouette is normal in size and configuration. No mediastinal or hilar masses. No evidence of adenopathy. - Lungs are clear.  No pleural effusion or pneumothorax. - Skeletal structures are intact. Small stable metallic foreign body within the left anterior chest wall. IMPRESSION: No active cardiopulmonary disease.   EKG: 09/12/23: Sinus bradycardia at 51 bpm Otherwise normal ECG When compared with ECG of 24-Jul-2023 13:09, PREVIOUS ECG IS PRESENT Confirmed by Grady Lawman ([id number]) on 09/13/2023 6:35:08 AM   CV: US  Carotid 09/12/23: Summary:  - Right Carotid: The extracranial vessels were near-normal with only minimal  wall thickening or plaque.  - Left Carotid: The extracranial vessels were near-normal with only minimal  wall thickening or plaque.  - Vertebrals:  Left vertebral artery demonstrates antegrade flow. Right  vertebral artery demonstrates dampened flow.  - Subclavians:  Normal flow hemodynamics were seen in bilateral subclavian arteries.    Echo 07/26/23: IMPRESSIONS   1. Left ventricular ejection fraction, by estimation, is 60 to 65%. Left  ventricular ejection fraction by 3D volume is 62 %. The left ventricle has  normal function. The left ventricle has no regional wall motion  abnormalities. Left ventricular diastolic   parameters are consistent with Grade I diastolic dysfunction (impaired  relaxation). The  average left ventricular global longitudinal strain is  -26.7 %. The global longitudinal strain is normal.   2. Right ventricular systolic function is normal. The right ventricular  size is normal.   3. The mitral valve is normal in structure. Mild mitral valve  regurgitation. No evidence of mitral stenosis.   4. The aortic valve is tricuspid. Aortic valve regurgitation is mild. No  aortic stenosis is present.   5. Aortic dilatation noted. There is mild dilatation of the aortic root,  measuring 45 mm. There is mild dilatation of the ascending aorta,  measuring 43 mm.   6. The inferior vena cava is normal in size with greater than 50%  respiratory variability, suggesting right atrial pressure of 3 mmHg.    Cardiac cath 07/26/23:   Prox RCA lesion is 20% stenosed.   Mid LAD lesion is 90% stenosed.   1st Diag lesion is 70% stenosed.   2nd Diag lesion is 70% stenosed.   Severe complex lesion involving the proximal to mid LAD and a large caliber Diagonal branch. The LAD lesion is calcified and severely stenosed. The Large Diagonal has ostial disease. The LAD beyond the Diagonal branch has a sharp turn and then an aneurysmal segment.  No disease in the Circumflex Mild non-obstructive disease in the RCA LVEDP=13 mmHg   Recommendations: Given the complex disease in the calcified mid LAD involving the Diagonal branch, I think the best option for revascularization is CABG. PCI would require orbital atherectomy of the LAD and given the large caliber Diagonal branch, stenting of the LAD could compromise flow into the Diagonal branch. I do not think the vessels are favorable for bifurcation stenting. Continue ASA and statin. Outpatient referral to CT surgery for CABG. Echo in the office before his surgical consult.     US  EVAR Duplex 04/17/23: Summary:  Abdominal Aorta: The largest aortic diameter remains essentially unchanged  compared to prior exam. Previous diameter measurement was 1.7 cm obtained   on 04/11/2022.  NOTE: Proximal aorta measures approximately 2.97 cm    Past Medical History:  Diagnosis Date   Anginal pain (HCC)    atypical chest pain cardiac cath performed at that time   Arthritis    Asthma    Atypical chest pain    Barrett's esophagus    Bilateral cataracts    BPH (benign prostatic hyperplasia)    Cancer (HCC)    skin ca in face and hand   Chicken pox    Chronic constipation    no current problem per pt 09/12/23   COPD (chronic obstructive pulmonary disease) (HCC)    Coronary artery disease 07/14/2007   a.) LHC 07/14/2007: 40% pRCA-1, 40% pRCA-2, 40% dRCA, 20% RPL1, 30% LM, 20% mLCx-1, 40% mLCx-2, 20% OM3-1, 20% OM3-2, 20% pLAD, 40% mLAD-1, 70% mLAD-2, 40% mLAD-3, 30% dLAD, 40% D1, 40% D2 --> med mgmt.   Depression    Foreign body (FB) in soft tissue    Former tobacco use    GERD (gastroesophageal reflux disease)    History of 2019 novel coronavirus disease (COVID-19)  04/07/2019   a.) tested (+) at [facility] 04/16/2019, 05/30/2021; b.) 2 other (+) tests at home (dates unknown)   History of kidney stones    Hyperlipidemia    Hyperplastic colon polyp    Hypertension    Infrarenal abdominal aortic aneurysm (AAA) without rupture (HCC)    a.) MR lumbar spine 12/30/2020: infrarenal saccular AAA measuring 2.2 cm; b.) CTA A/P 01/05/2021: measured 2.6 cm; c.) s/p EVAR 03/03/2021   Ischemic heart disease 12/20/2010   Long term current use of immunosuppressive drug    a.) on ofatumumab for MS   MS (multiple sclerosis) (HCC)    a.) on ofatumumab   Neuromuscular disorder (HCC)    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 - takes gabapentin    Pneumonia    x several   Pre-diabetes    Shingles    Tubular adenoma of colon 08/23/2016    Past Surgical History:  Procedure Laterality Date   ABDOMINAL AORTIC ENDOVASCULAR STENT GRAFT N/A 03/03/2021   Procedure: ABDOMINAL AORTIC ENDOVASCULAR STENT GRAFT;  Surgeon: Adine Hoof, MD;  Location[facility] OR;  Service: Vascular;   Laterality: N/A;   ADENOIDECTOMY  1960   APPENDECTOMY  1979   CATARACT EXTRACTION, BILATERAL  03/12/2019   COLONOSCOPY  02/05/2008   COLONOSCOPY WITH PROPOFOL  N/A 08/23/2016   Procedure: COLONOSCOPY WITH PROPOFOL ;  Surgeon: Deveron Fly, MD;  Location: [facility] ENDOSCOPY;  Service: Endoscopy;  Laterality: N/A;   COLONOSCOPY WITH PROPOFOL  N/A 04/29/2020   Procedure: COLONOSCOPY WITH PROPOFOL ;  Surgeon: Shane Darling, MD;  Location: [facility] ENDOSCOPY;  Service: Endoscopy;  Laterality: N/A;   CYSTOSCOPY WITH INSERTION OF UROLIFT N/A 05/20/2020   Procedure: CYSTOSCOPY WITH INSERTION OF UROLIFT;  Surgeon: Lawerence Pressman, MD;  Location: [facility] ORS;  Service: Urology;  Laterality: N/A;   ESOPHAGOGASTRODUODENOSCOPY (EGD) WITH PROPOFOL  N/A 08/23/2016   Procedure: ESOPHAGOGASTRODUODENOSCOPY (EGD) WITH PROPOFOL ;  Surgeon: Deveron Fly, MD;  Location: [facility] ENDOSCOPY;  Service: Endoscopy;  Laterality: N/A;   ESOPHAGOGASTRODUODENOSCOPY (EGD) WITH PROPOFOL  N/A 04/29/2020   Procedure: ESOPHAGOGASTRODUODENOSCOPY (EGD) WITH PROPOFOL ;  Surgeon: Shane Darling, MD;  Location: [facility] ENDOSCOPY;  Service: Endoscopy;  Laterality: N/A;   ESOPHAGOGASTRODUODENOSCOPY ENDOSCOPY  02/05/2008   EYE SURGERY     eye trauma  1970   piece of metal to eye removed in clinic   JOINT REPLACEMENT     KNEE ARTHROPLASTY Right 10/05/2020   Procedure: COMPUTER ASSISTED TOTAL KNEE ARTHROPLASTY;  Surgeon: Arlyne Lame, MD;  Location: [facility] ORS;  Service: Orthopedics;  Laterality: Right;   KNEE ARTHROPLASTY Left 06/08/2022   Procedure: COMPUTER ASSISTED TOTAL KNEE ARTHROPLASTY;  Surgeon: Arlyne Lame, MD;  Location: [facility] ORS;  Service: Orthopedics;  Laterality: Left;   KNEE ARTHROSCOPY Left 12/11/2021   Procedure: ARTHROSCOPY KNEE;  Surgeon: Arlyne Lame, MD;  Location: [facility] ORS;  Service: Orthopedics;  Laterality: Left;   KNEE ARTHROSCOPY W/ PARTIAL MEDIAL MENISCECTOMY Right 01/02/2011   LEFT HEART CATH AND  CORONARY ANGIOGRAPHY Left 07/14/2007   Procedure: LEFT HEART CATH AND CORONARY ANGIOGRAPHY; Location: [facility]; Surgeon: Taffy Fabian, MD   LEFT HEART CATH AND CORONARY ANGIOGRAPHY N/A 07/26/2023   Procedure: LEFT HEART CATH AND CORONARY ANGIOGRAPHY;  Surgeon: Odie Benne, MD;  Location[facility] INVASIVE CV LAB;  Service: Cardiovascular;  Laterality: N/A;   SEPTOPLASTY     TONSILLECTOMY  02/05/2008   ULTRASOUND GUIDANCE FOR VASCULAR ACCESS Left 03/03/2021   Procedure: ULTRASOUND GUIDANCE FOR VASCULAR ACCESS, LEFT FEMORAL ARTERY;  Surgeon: Adine Hoof, MD;  Location: [facility]  OR;  Service: Vascular;  Laterality: Left;   WRIST FRACTURE SURGERY Right     MEDICATIONS:  acetaminophen  (TYLENOL ) 500 MG tablet   amLODipine  (NORVASC ) 5 MG tablet   amphetamine -dextroamphetamine (ADDERALL XR) 25 MG 24 hr capsule   aspirin  EC 81 MG tablet   baclofen  (LIORESAL ) 20 MG tablet   BREZTRI  AEROSPHERE 160-9-4.8 MCG/ACT AERO   buPROPion  (WELLBUTRIN  XL) 150 MG 24 hr tablet   celecoxib  (CELEBREX ) 200 MG capsule   gabapentin  (NEURONTIN ) 300 MG capsule   isosorbide  mononitrate (IMDUR ) 30 MG 24 hr tablet   montelukast  (SINGULAIR ) 10 MG tablet   Multiple Vitamins-Minerals (MULTIVITAMIN WITH MINERALS) tablet   Ofatumumab (KESIMPTA) 20 MG/0.4ML SOAJ   pantoprazole  (PROTONIX ) 20 MG tablet   Polyethyl Glycol-Propyl Glycol (SYSTANE) 0.4-0.3 % SOLN   rosuvastatin  (CRESTOR ) 20 MG tablet   Spacer/Aero-Holding Chambers (AEROCHAMBER MV) inhaler   tamsulosin  (FLOMAX ) 0.4 MG CAPS capsule   traZODone  (DESYREL ) 100 MG tablet   VENTOLIN  HFA 108 (90 Base) MCG/ACT inhaler   Vibegron  (GEMTESA ) 75 MG TABS   VITAMIN D  PO   No current facility-administered medications for this encounter.    Ella Gun, PA-C Surgical Short Stay/Anesthesiology [facility] Phone [phone] [facility] Phone [phone] 09/13/2023 10:11 AM

## 2023-09-13 NOTE — Anesthesia Preprocedure Evaluation (Signed)
 Anesthesia Evaluation    Airway        Dental   Pulmonary former smoker          Cardiovascular hypertension,      Neuro/Psych    GI/Hepatic   Endo/Other    Renal/GU      Musculoskeletal   Abdominal   Peds  Hematology   Anesthesia Other Findings   Reproductive/Obstetrics                             Anesthesia Physical Anesthesia Plan  ASA:   Anesthesia Plan:    Post-op Pain Management:    Induction:   PONV Risk Score and Plan:   Airway Management Planned:   Additional Equipment:   Intra-op Plan:   Post-operative Plan:   Informed Consent:   Plan Discussed with:   Anesthesia Plan Comments: (PAT note written 09/13/2023 by Bless Lisenby, PA-C.    )       Anesthesia Quick Evaluation

## 2023-09-16 ENCOUNTER — Inpatient Hospital Stay (HOSPITAL_COMMUNITY)

## 2023-09-16 ENCOUNTER — Other Ambulatory Visit: Payer: Self-pay

## 2023-09-16 ENCOUNTER — Inpatient Hospital Stay (HOSPITAL_COMMUNITY)
Admission: RE | Admit: 2023-09-16 | Discharge: 2023-09-21 | DRG: 236 | Disposition: A | Discharge status: Home / Self Care | Attending: Thoracic Surgery (Cardiothoracic Vascular Surgery) | Admitting: Thoracic Surgery (Cardiothoracic Vascular Surgery)

## 2023-09-16 ENCOUNTER — Encounter (HOSPITAL_COMMUNITY): Payer: Self-pay | Admitting: Thoracic Surgery (Cardiothoracic Vascular Surgery)

## 2023-09-16 ENCOUNTER — Inpatient Hospital Stay (HOSPITAL_COMMUNITY): Payer: Self-pay | Admitting: Vascular Surgery

## 2023-09-16 ENCOUNTER — Encounter (HOSPITAL_COMMUNITY)
Admission: RE | Disposition: A | Discharge status: Home / Self Care | Payer: Self-pay | Source: Home / Self Care | Attending: Thoracic Surgery (Cardiothoracic Vascular Surgery)

## 2023-09-16 ENCOUNTER — Inpatient Hospital Stay (HOSPITAL_COMMUNITY): Payer: Self-pay

## 2023-09-16 DIAGNOSIS — Z95820 Peripheral vascular angioplasty status with implants and grafts: Secondary | ICD-10-CM | POA: Diagnosis not present

## 2023-09-16 DIAGNOSIS — J90 Pleural effusion, not elsewhere classified: Secondary | ICD-10-CM | POA: Diagnosis not present

## 2023-09-16 DIAGNOSIS — Z8619 Personal history of other infectious and parasitic diseases: Secondary | ICD-10-CM

## 2023-09-16 DIAGNOSIS — E119 Type 2 diabetes mellitus without complications: Secondary | ICD-10-CM | POA: Diagnosis present

## 2023-09-16 DIAGNOSIS — J9811 Atelectasis: Secondary | ICD-10-CM | POA: Diagnosis not present

## 2023-09-16 DIAGNOSIS — E785 Hyperlipidemia, unspecified: Secondary | ICD-10-CM | POA: Diagnosis present

## 2023-09-16 DIAGNOSIS — K219 Gastro-esophageal reflux disease without esophagitis: Secondary | ICD-10-CM | POA: Diagnosis present

## 2023-09-16 DIAGNOSIS — Z96653 Presence of artificial knee joint, bilateral: Secondary | ICD-10-CM | POA: Diagnosis present

## 2023-09-16 DIAGNOSIS — Z7951 Long term (current) use of inhaled steroids: Secondary | ICD-10-CM

## 2023-09-16 DIAGNOSIS — F909 Attention-deficit hyperactivity disorder, unspecified type: Secondary | ICD-10-CM | POA: Diagnosis present

## 2023-09-16 DIAGNOSIS — I25119 Atherosclerotic heart disease of native coronary artery with unspecified angina pectoris: Secondary | ICD-10-CM

## 2023-09-16 DIAGNOSIS — R0789 Other chest pain: Secondary | ICD-10-CM

## 2023-09-16 DIAGNOSIS — F32A Depression, unspecified: Secondary | ICD-10-CM | POA: Diagnosis present

## 2023-09-16 DIAGNOSIS — Z8616 Personal history of COVID-19: Secondary | ICD-10-CM | POA: Diagnosis not present

## 2023-09-16 DIAGNOSIS — D84821 Immunodeficiency due to drugs: Secondary | ICD-10-CM | POA: Diagnosis present

## 2023-09-16 DIAGNOSIS — Z79899 Other long term (current) drug therapy: Secondary | ICD-10-CM | POA: Diagnosis not present

## 2023-09-16 DIAGNOSIS — G35 Multiple sclerosis: Secondary | ICD-10-CM | POA: Diagnosis present

## 2023-09-16 DIAGNOSIS — I10 Essential (primary) hypertension: Secondary | ICD-10-CM | POA: Diagnosis present

## 2023-09-16 DIAGNOSIS — J449 Chronic obstructive pulmonary disease, unspecified: Secondary | ICD-10-CM

## 2023-09-16 DIAGNOSIS — I480 Paroxysmal atrial fibrillation: Secondary | ICD-10-CM | POA: Diagnosis not present

## 2023-09-16 DIAGNOSIS — N400 Enlarged prostate without lower urinary tract symptoms: Secondary | ICD-10-CM | POA: Diagnosis present

## 2023-09-16 DIAGNOSIS — D62 Acute posthemorrhagic anemia: Secondary | ICD-10-CM | POA: Diagnosis not present

## 2023-09-16 DIAGNOSIS — Z87891 Personal history of nicotine dependence: Secondary | ICD-10-CM | POA: Diagnosis not present

## 2023-09-16 DIAGNOSIS — I2510 Atherosclerotic heart disease of native coronary artery without angina pectoris: Principal | ICD-10-CM | POA: Diagnosis present

## 2023-09-16 DIAGNOSIS — Z951 Presence of aortocoronary bypass graft: Secondary | ICD-10-CM | POA: Diagnosis not present

## 2023-09-16 DIAGNOSIS — Z7982 Long term (current) use of aspirin: Secondary | ICD-10-CM | POA: Diagnosis not present

## 2023-09-16 DIAGNOSIS — Z860101 Personal history of adenomatous and serrated colon polyps: Secondary | ICD-10-CM

## 2023-09-16 DIAGNOSIS — J4489 Other specified chronic obstructive pulmonary disease: Secondary | ICD-10-CM | POA: Diagnosis present

## 2023-09-16 DIAGNOSIS — Z7960 Long term (current) use of unspecified immunomodulators and immunosuppressants: Secondary | ICD-10-CM

## 2023-09-16 DIAGNOSIS — I97190 Other postprocedural cardiac functional disturbances following cardiac surgery: Secondary | ICD-10-CM | POA: Diagnosis not present

## 2023-09-16 DIAGNOSIS — Z8249 Family history of ischemic heart disease and other diseases of the circulatory system: Secondary | ICD-10-CM

## 2023-09-16 DIAGNOSIS — Z87442 Personal history of urinary calculi: Secondary | ICD-10-CM

## 2023-09-16 DIAGNOSIS — Z83511 Family history of glaucoma: Secondary | ICD-10-CM

## 2023-09-16 DIAGNOSIS — Z800 Family history of malignant neoplasm of digestive organs: Secondary | ICD-10-CM

## 2023-09-16 DIAGNOSIS — Z8679 Personal history of other diseases of the circulatory system: Secondary | ICD-10-CM

## 2023-09-16 HISTORY — PX: CORONARY ARTERY BYPASS GRAFT: SHX141

## 2023-09-16 HISTORY — PX: TEE WITHOUT CARDIOVERSION: SHX5443

## 2023-09-16 LAB — POCT I-STAT 7, (LYTES, BLD GAS, ICA,H+H)
Acid-Base Excess: 0 mmol/L (ref 0.0–2.0)
Acid-base deficit: 3 mmol/L — ABNORMAL HIGH (ref 0.0–2.0)
Acid-base deficit: 1 mmol/L (ref 0.0–2.0)
Acid-base deficit: 2 mmol/L (ref 0.0–2.0)
Acid-base deficit: 2 mmol/L (ref 0.0–2.0)
Acid-base deficit: 4 mmol/L — ABNORMAL HIGH (ref 0.0–2.0)
Acid-base deficit: 5 mmol/L — ABNORMAL HIGH (ref 0.0–2.0)
Bicarbonate: 22.9 mmol/L (ref 20.0–28.0)
Bicarbonate: 24.2 mmol/L (ref 20.0–28.0)
Bicarbonate: 23.4 mmol/L (ref 20.0–28.0)
Bicarbonate: 24.7 mmol/L (ref 20.0–28.0)
Bicarbonate: 23.1 mmol/L (ref 20.0–28.0)
Bicarbonate: 23 mmol/L (ref 20.0–28.0)
Bicarbonate: 20.6 mmol/L (ref 20.0–28.0)
Calcium, Ion: 1.15 mmol/L (ref 1.15–1.40)
Calcium, Ion: 0.98 mmol/L — ABNORMAL LOW (ref 1.15–1.40)
Calcium, Ion: 1.02 mmol/L — ABNORMAL LOW (ref 1.15–1.40)
Calcium, Ion: 1.26 mmol/L (ref 1.15–1.40)
Calcium, Ion: 1.17 mmol/L (ref 1.15–1.40)
Calcium, Ion: 1.19 mmol/L (ref 1.15–1.40)
Calcium, Ion: 1.09 mmol/L — ABNORMAL LOW (ref 1.15–1.40)
HCT: 32 % — ABNORMAL LOW (ref 39.0–52.0)
HCT: 26 % — ABNORMAL LOW (ref 39.0–52.0)
HCT: 25 % — ABNORMAL LOW (ref 39.0–52.0)
HCT: 26 % — ABNORMAL LOW (ref 39.0–52.0)
HCT: 28 % — ABNORMAL LOW (ref 39.0–52.0)
HCT: 33 % — ABNORMAL LOW (ref 39.0–52.0)
HCT: 29 % — ABNORMAL LOW (ref 39.0–52.0)
Hemoglobin: 10.9 g/dL — ABNORMAL LOW (ref 13.0–17.0)
Hemoglobin: 8.8 g/dL — ABNORMAL LOW (ref 13.0–17.0)
Hemoglobin: 8.5 g/dL — ABNORMAL LOW (ref 13.0–17.0)
Hemoglobin: 8.8 g/dL — ABNORMAL LOW (ref 13.0–17.0)
Hemoglobin: 9.5 g/dL — ABNORMAL LOW (ref 13.0–17.0)
Hemoglobin: 11.2 g/dL — ABNORMAL LOW (ref 13.0–17.0)
Hemoglobin: 9.9 g/dL — ABNORMAL LOW (ref 13.0–17.0)
O2 Saturation: 100 %
O2 Saturation: 100 %
O2 Saturation: 100 %
O2 Saturation: 95 %
O2 Saturation: 99 %
O2 Saturation: 100 %
O2 Saturation: 96 %
Patient temperature: 37.1
Patient temperature: 36.3
Patient temperature: 36.7
Potassium: 4.3 mmol/L (ref 3.5–5.1)
Potassium: 4.8 mmol/L (ref 3.5–5.1)
Potassium: 4.2 mmol/L (ref 3.5–5.1)
Potassium: 4.3 mmol/L (ref 3.5–5.1)
Potassium: 4 mmol/L (ref 3.5–5.1)
Potassium: 4.6 mmol/L (ref 3.5–5.1)
Potassium: 4.3 mmol/L (ref 3.5–5.1)
Sodium: 142 mmol/L (ref 135–145)
Sodium: 141 mmol/L (ref 135–145)
Sodium: 142 mmol/L (ref 135–145)
Sodium: 143 mmol/L (ref 135–145)
Sodium: 141 mmol/L (ref 135–145)
Sodium: 142 mmol/L (ref 135–145)
Sodium: 142 mmol/L (ref 135–145)
TCO2: 24 mmol/L (ref 22–32)
TCO2: 25 mmol/L (ref 22–32)
TCO2: 25 mmol/L (ref 22–32)
TCO2: 26 mmol/L (ref 22–32)
TCO2: 24 mmol/L (ref 22–32)
TCO2: 24 mmol/L (ref 22–32)
TCO2: 22 mmol/L (ref 22–32)
pCO2 arterial: 42.9 mmHg (ref 32–48)
pCO2 arterial: 39.5 mmHg (ref 32–48)
pCO2 arterial: 39.4 mmHg (ref 32–48)
pCO2 arterial: 42.9 mmHg (ref 32–48)
pCO2 arterial: 41.1 mmHg (ref 32–48)
pCO2 arterial: 48.1 mmHg — ABNORMAL HIGH (ref 32–48)
pCO2 arterial: 40.9 mmHg (ref 32–48)
pH, Arterial: 7.335 — ABNORMAL LOW (ref 7.35–7.45)
pH, Arterial: 7.394 (ref 7.35–7.45)
pH, Arterial: 7.345 — ABNORMAL LOW (ref 7.35–7.45)
pH, Arterial: 7.406 (ref 7.35–7.45)
pH, Arterial: 7.358 (ref 7.35–7.45)
pH, Arterial: 7.284 — ABNORMAL LOW (ref 7.35–7.45)
pH, Arterial: 7.309 — ABNORMAL LOW (ref 7.35–7.45)
pO2, Arterial: 191 mmHg — ABNORMAL HIGH (ref 83–108)
pO2, Arterial: 301 mmHg — ABNORMAL HIGH (ref 83–108)
pO2, Arterial: 349 mmHg — ABNORMAL HIGH (ref 83–108)
pO2, Arterial: 80 mmHg — ABNORMAL LOW (ref 83–108)
pO2, Arterial: 145 mmHg — ABNORMAL HIGH (ref 83–108)
pO2, Arterial: 197 mmHg — ABNORMAL HIGH (ref 83–108)
pO2, Arterial: 88 mmHg (ref 83–108)

## 2023-09-16 LAB — GLUCOSE, CAPILLARY
Glucose-Capillary: 128 mg/dL — ABNORMAL HIGH (ref 70–99)
Glucose-Capillary: 35 mg/dL — CL (ref 70–99)
Glucose-Capillary: 79 mg/dL (ref 70–99)
Glucose-Capillary: 95 mg/dL (ref 70–99)
Glucose-Capillary: 134 mg/dL — ABNORMAL HIGH (ref 70–99)
Glucose-Capillary: 100 mg/dL — ABNORMAL HIGH (ref 70–99)
Glucose-Capillary: 125 mg/dL — ABNORMAL HIGH (ref 70–99)
Glucose-Capillary: 130 mg/dL — ABNORMAL HIGH (ref 70–99)
Glucose-Capillary: 102 mg/dL — ABNORMAL HIGH (ref 70–99)
Glucose-Capillary: 136 mg/dL — ABNORMAL HIGH (ref 70–99)
Glucose-Capillary: 146 mg/dL — ABNORMAL HIGH (ref 70–99)
Glucose-Capillary: 128 mg/dL — ABNORMAL HIGH (ref 70–99)

## 2023-09-16 LAB — PLATELET COUNT: Platelets: 162 10*3/uL (ref 150–400)

## 2023-09-16 LAB — POCT I-STAT, CHEM 8
BUN: 17 mg/dL (ref 8–23)
BUN: 14 mg/dL (ref 8–23)
BUN: 15 mg/dL (ref 8–23)
BUN: 14 mg/dL (ref 8–23)
BUN: 13 mg/dL (ref 8–23)
Calcium, Ion: 1.2 mmol/L (ref 1.15–1.40)
Calcium, Ion: 1.12 mmol/L — ABNORMAL LOW (ref 1.15–1.40)
Calcium, Ion: 1.15 mmol/L (ref 1.15–1.40)
Calcium, Ion: 0.97 mmol/L — ABNORMAL LOW (ref 1.15–1.40)
Calcium, Ion: 1.27 mmol/L (ref 1.15–1.40)
Chloride: 105 mmol/L (ref 98–111)
Chloride: 106 mmol/L (ref 98–111)
Chloride: 106 mmol/L (ref 98–111)
Chloride: 105 mmol/L (ref 98–111)
Chloride: 106 mmol/L (ref 98–111)
Creatinine, Ser: 0.7 mg/dL (ref 0.61–1.24)
Creatinine, Ser: 0.7 mg/dL (ref 0.61–1.24)
Creatinine, Ser: 0.7 mg/dL (ref 0.61–1.24)
Creatinine, Ser: 0.6 mg/dL — ABNORMAL LOW (ref 0.61–1.24)
Creatinine, Ser: 0.7 mg/dL (ref 0.61–1.24)
Glucose, Bld: 120 mg/dL — ABNORMAL HIGH (ref 70–99)
Glucose, Bld: 132 mg/dL — ABNORMAL HIGH (ref 70–99)
Glucose, Bld: 156 mg/dL — ABNORMAL HIGH (ref 70–99)
Glucose, Bld: 143 mg/dL — ABNORMAL HIGH (ref 70–99)
Glucose, Bld: 126 mg/dL — ABNORMAL HIGH (ref 70–99)
HCT: 35 % — ABNORMAL LOW (ref 39.0–52.0)
HCT: 35 % — ABNORMAL LOW (ref 39.0–52.0)
HCT: 33 % — ABNORMAL LOW (ref 39.0–52.0)
HCT: 26 % — ABNORMAL LOW (ref 39.0–52.0)
HCT: 28 % — ABNORMAL LOW (ref 39.0–52.0)
Hemoglobin: 11.9 g/dL — ABNORMAL LOW (ref 13.0–17.0)
Hemoglobin: 11.9 g/dL — ABNORMAL LOW (ref 13.0–17.0)
Hemoglobin: 11.2 g/dL — ABNORMAL LOW (ref 13.0–17.0)
Hemoglobin: 8.8 g/dL — ABNORMAL LOW (ref 13.0–17.0)
Hemoglobin: 9.5 g/dL — ABNORMAL LOW (ref 13.0–17.0)
Potassium: 4.1 mmol/L (ref 3.5–5.1)
Potassium: 4.3 mmol/L (ref 3.5–5.1)
Potassium: 4.4 mmol/L (ref 3.5–5.1)
Potassium: 4.9 mmol/L (ref 3.5–5.1)
Potassium: 4.2 mmol/L (ref 3.5–5.1)
Sodium: 142 mmol/L (ref 135–145)
Sodium: 141 mmol/L (ref 135–145)
Sodium: 141 mmol/L (ref 135–145)
Sodium: 141 mmol/L (ref 135–145)
Sodium: 142 mmol/L (ref 135–145)
TCO2: 26 mmol/L (ref 22–32)
TCO2: 22 mmol/L (ref 22–32)
TCO2: 24 mmol/L (ref 22–32)
TCO2: 24 mmol/L (ref 22–32)
TCO2: 21 mmol/L — ABNORMAL LOW (ref 22–32)

## 2023-09-16 LAB — CBC
HCT: 27.5 % — ABNORMAL LOW (ref 39.0–52.0)
HCT: 34.4 % — ABNORMAL LOW (ref 39.0–52.0)
Hemoglobin: 8.8 g/dL — ABNORMAL LOW (ref 13.0–17.0)
Hemoglobin: 11.3 g/dL — ABNORMAL LOW (ref 13.0–17.0)
MCH: 31.3 pg (ref 26.0–34.0)
MCH: 31.7 pg (ref 26.0–34.0)
MCHC: 32 g/dL (ref 30.0–36.0)
MCHC: 32.8 g/dL (ref 30.0–36.0)
MCV: 97.9 fL (ref 80.0–100.0)
MCV: 96.6 fL (ref 80.0–100.0)
Platelets: 108 10*3/uL — ABNORMAL LOW (ref 150–400)
Platelets: 151 10*3/uL (ref 150–400)
RBC: 2.81 MIL/uL — ABNORMAL LOW (ref 4.22–5.81)
RBC: 3.56 MIL/uL — ABNORMAL LOW (ref 4.22–5.81)
RDW: 13.3 % (ref 11.5–15.5)
RDW: 13.4 % (ref 11.5–15.5)
WBC: 12.2 10*3/uL — ABNORMAL HIGH (ref 4.0–10.5)
WBC: 13.3 10*3/uL — ABNORMAL HIGH (ref 4.0–10.5)
nRBC: 0 % (ref 0.0–0.2)
nRBC: 0 % (ref 0.0–0.2)

## 2023-09-16 LAB — BASIC METABOLIC PANEL WITH GFR
Anion gap: 7 (ref 5–15)
BUN: 11 mg/dL (ref 8–23)
CO2: 23 mmol/L (ref 22–32)
Calcium: 7.8 mg/dL — ABNORMAL LOW (ref 8.9–10.3)
Chloride: 108 mmol/L (ref 98–111)
Creatinine, Ser: 0.78 mg/dL (ref 0.61–1.24)
GFR, Estimated: >60 mL/min (ref >60)
Glucose, Bld: 138 mg/dL — ABNORMAL HIGH (ref 70–99)
Potassium: 4.4 mmol/L (ref 3.5–5.1)
Sodium: 138 mmol/L (ref 135–145)

## 2023-09-16 LAB — PROTIME-INR
INR: 1.3 — ABNORMAL HIGH (ref 0.8–1.2)
Prothrombin Time: 16.2 s — ABNORMAL HIGH (ref 11.4–15.2)

## 2023-09-16 LAB — ECHO INTRAOPERATIVE TEE
AV Mean grad: 2 mmHg
AV Peak grad: 3.3 mmHg
Ao pk vel: 0.91 m/s
Height: 72 in
S' Lateral: 3.3 cm
Weight: 3168 [oz_av]

## 2023-09-16 LAB — POCT I-STAT EG7
Acid-base deficit: 1 mmol/L (ref 0.0–2.0)
Bicarbonate: 24.4 mmol/L (ref 20.0–28.0)
Calcium, Ion: 1 mmol/L — ABNORMAL LOW (ref 1.15–1.40)
HCT: 26 % — ABNORMAL LOW (ref 39.0–52.0)
Hemoglobin: 8.8 g/dL — ABNORMAL LOW (ref 13.0–17.0)
O2 Saturation: 76 %
Potassium: 4.2 mmol/L (ref 3.5–5.1)
Sodium: 141 mmol/L (ref 135–145)
TCO2: 26 mmol/L (ref 22–32)
pCO2, Ven: 42.7 mmHg — ABNORMAL LOW (ref 44–60)
pH, Ven: 7.366 (ref 7.25–7.43)
pO2, Ven: 42 mmHg (ref 32–45)

## 2023-09-16 LAB — APTT: aPTT: 33 s (ref 24–36)

## 2023-09-16 LAB — HEMOGLOBIN AND HEMATOCRIT, BLOOD
HCT: 26.7 % — ABNORMAL LOW (ref 39.0–52.0)
Hemoglobin: 8.8 g/dL — ABNORMAL LOW (ref 13.0–17.0)

## 2023-09-16 SURGERY — OFF PUMP CORONARY ARTERY BYPASS GRAFTING (CABG)
Anesthesia: General | Site: Chest

## 2023-09-16 MED ORDER — ~~LOC~~ CARDIAC SURGERY, PATIENT & FAMILY EDUCATION
Freq: Once | Status: DC
  Filled 2023-09-16: qty 1

## 2023-09-16 MED ORDER — POTASSIUM CHLORIDE 10 MEQ/50ML IV SOLN: 10.0000 meq | INTRAVENOUS | Status: AC

## 2023-09-16 MED ORDER — PHENYLEPHRINE 80 MCG/ML (10ML) SYRINGE FOR IV PUSH (FOR BLOOD PRESSURE SUPPORT): PREFILLED_SYRINGE | INTRAVENOUS | Status: DC | PRN

## 2023-09-16 MED ORDER — PROTAMINE SULFATE 10 MG/ML IV SOLN
INTRAVENOUS | Status: AC
  Filled 2023-09-16: qty 25

## 2023-09-16 MED ORDER — SODIUM CHLORIDE 0.9% FLUSH: 3.0000 mL | INTRAVENOUS | Status: DC | PRN

## 2023-09-16 MED ORDER — SODIUM CHLORIDE 0.9% FLUSH
3.0000 mL | Freq: Two times a day (BID) | INTRAVENOUS | Status: DC
  Administered 2023-09-16 – 2023-09-17 (×3): 10 mL via INTRAVENOUS

## 2023-09-16 MED ORDER — TRAMADOL HCL 50 MG PO TABS
50.0000 mg | ORAL_TABLET | ORAL | Status: DC | PRN
  Administered 2023-09-16: 50 mg via ORAL
  Administered 2023-09-17 – 2023-09-19 (×5): 100 mg via ORAL
  Administered 2023-09-19: 50 mg via ORAL
  Administered 2023-09-20: 100 mg via ORAL
  Administered 2023-09-20: 50 mg via ORAL
  Administered 2023-09-20 – 2023-09-21 (×4): 100 mg via ORAL
  Filled 2023-09-16: qty 2
  Filled 2023-09-16: qty 1
  Filled 2023-09-16 (×10): qty 2
  Filled 2023-09-16: qty 1
  Filled 2023-09-16: qty 2

## 2023-09-16 MED ORDER — ACETAMINOPHEN 500 MG PO TABS
1000.0000 mg | ORAL_TABLET | Freq: Four times a day (QID) | ORAL | Status: DC
Start: 2023-09-17 — End: 2023-09-21
  Administered 2023-09-16 – 2023-09-21 (×16): 1000 mg via ORAL
  Filled 2023-09-16 (×16): qty 2

## 2023-09-16 MED ORDER — VANCOMYCIN HCL IN DEXTROSE 1-5 GM/200ML-% IV SOLN
1000.0000 mg | Freq: Once | INTRAVENOUS | Status: AC
Start: 2023-09-16 — End: 2023-09-16
  Administered 2023-09-16: 1000 mg via INTRAVENOUS
  Filled 2023-09-16: qty 200

## 2023-09-16 MED ORDER — LACTATED RINGERS IV SOLN: INTRAVENOUS | Status: DC | PRN

## 2023-09-16 MED ORDER — HEPARIN SODIUM (PORCINE) 1000 UNIT/ML IJ SOLN
INTRAMUSCULAR | Status: AC
Start: 2023-09-16 — End: 2023-09-16
  Filled 2023-09-16: qty 10

## 2023-09-16 MED ORDER — FENTANYL CITRATE (PF) 250 MCG/5ML IJ SOLN
INTRAMUSCULAR | Status: AC
  Filled 2023-09-16: qty 5

## 2023-09-16 MED ORDER — SODIUM CHLORIDE 0.9% FLUSH
3.0000 mL | Freq: Two times a day (BID) | INTRAVENOUS | Status: DC
  Administered 2023-09-16 – 2023-09-19 (×3): 10 mL via INTRAVENOUS

## 2023-09-16 MED ORDER — CHLORHEXIDINE GLUCONATE CLOTH 2 % EX PADS
6.0000 | MEDICATED_PAD | Freq: Every day | CUTANEOUS | Status: DC
  Administered 2023-09-16 – 2023-09-18 (×3): 6 via TOPICAL

## 2023-09-16 MED ORDER — TAMSULOSIN HCL 0.4 MG PO CAPS
0.4000 mg | ORAL_CAPSULE | Freq: Every day | ORAL | Status: DC
  Administered 2023-09-17 – 2023-09-20 (×4): 0.4 mg via ORAL
  Filled 2023-09-16 (×4): qty 1

## 2023-09-16 MED ORDER — FENTANYL CITRATE (PF) 250 MCG/5ML IJ SOLN
INTRAMUSCULAR | Status: DC | PRN
  Administered 2023-09-16: 175 ug via INTRAVENOUS
  Administered 2023-09-16: 150 ug via INTRAVENOUS
  Administered 2023-09-16: 100 ug via INTRAVENOUS
  Administered 2023-09-16: 150 ug via INTRAVENOUS
  Administered 2023-09-16: 100 ug via INTRAVENOUS
  Administered 2023-09-16: 150 ug via INTRAVENOUS
  Administered 2023-09-16: 100 ug via INTRAVENOUS
  Administered 2023-09-16: 75 ug via INTRAVENOUS

## 2023-09-16 MED ORDER — BUDESON-GLYCOPYRROL-FORMOTEROL 160-9-4.8 MCG/ACT IN AERO
2.0000 | INHALATION_SPRAY | Freq: Two times a day (BID) | RESPIRATORY_TRACT | Status: DC
  Administered 2023-09-17 – 2023-09-21 (×6): 2 via RESPIRATORY_TRACT
  Filled 2023-09-16: qty 5.9

## 2023-09-16 MED ORDER — PANTOPRAZOLE SODIUM 40 MG IV SOLR
40.0000 mg | Freq: Every day | INTRAVENOUS | Status: AC
Start: 2023-09-16 — End: 2023-09-18
  Administered 2023-09-16 – 2023-09-17 (×2): 40 mg via INTRAVENOUS
  Filled 2023-09-16 (×2): qty 10

## 2023-09-16 MED ORDER — EPINEPHRINE 1 MG/10ML IJ SOSY
PREFILLED_SYRINGE | INTRAMUSCULAR | Status: AC
  Filled 2023-09-16: qty 10

## 2023-09-16 MED ORDER — SODIUM CHLORIDE (PF) 0.9 % IJ SOLN: OROMUCOSAL | Status: DC | PRN

## 2023-09-16 MED ORDER — MORPHINE SULFATE (PF) 2 MG/ML IV SOLN
1.0000 mg | INTRAVENOUS | Status: DC | PRN
  Administered 2023-09-16: 2 mg via INTRAVENOUS
  Administered 2023-09-16: 4 mg via INTRAVENOUS
  Administered 2023-09-17 (×3): 2 mg via INTRAVENOUS
  Filled 2023-09-16 (×4): qty 1
  Filled 2023-09-16: qty 2

## 2023-09-16 MED ORDER — PROTAMINE SULFATE 10 MG/ML IV SOLN
INTRAVENOUS | Status: DC | PRN
  Administered 2023-09-16: 230 mg via INTRAVENOUS
  Administered 2023-09-16: 20 mg via INTRAVENOUS

## 2023-09-16 MED ORDER — SODIUM CHLORIDE 0.9% FLUSH
3.0000 mL | Freq: Two times a day (BID) | INTRAVENOUS | Status: DC
  Administered 2023-09-16: 3 mL via INTRAVENOUS
  Administered 2023-09-16 – 2023-09-18 (×3): 10 mL via INTRAVENOUS

## 2023-09-16 MED ORDER — PROPOFOL 10 MG/ML IV BOLUS
INTRAVENOUS | Status: DC | PRN
  Administered 2023-09-16: 75 mg via INTRAVENOUS
  Administered 2023-09-16: 20 mg via INTRAVENOUS

## 2023-09-16 MED ORDER — 0.9 % SODIUM CHLORIDE (POUR BTL) OPTIME
TOPICAL | Status: DC | PRN
  Administered 2023-09-16: 5000 mL

## 2023-09-16 MED ORDER — NOREPINEPHRINE 4 MG/250ML-% IV SOLN
0.0000 ug/min | INTRAVENOUS | Status: DC
  Administered 2023-09-16: 1 ug/min via INTRAVENOUS
  Administered 2023-09-17: 4 ug/min via INTRAVENOUS
  Filled 2023-09-16: qty 250

## 2023-09-16 MED ORDER — MAGNESIUM SULFATE 4 GM/100ML IV SOLN
4.0000 g | Freq: Once | INTRAVENOUS | Status: AC
Start: 2023-09-16 — End: 2023-09-16
  Administered 2023-09-16: 4 g via INTRAVENOUS
  Filled 2023-09-16: qty 100

## 2023-09-16 MED ORDER — CHLORHEXIDINE GLUCONATE 0.12 % MT SOLN
15.0000 mL | OROMUCOSAL | Status: AC
  Administered 2023-09-16: 15 mL via OROMUCOSAL
  Filled 2023-09-16: qty 15

## 2023-09-16 MED ORDER — ALBUMIN HUMAN 5 % IV SOLN
250.0000 mL | INTRAVENOUS | Status: DC | PRN
  Administered 2023-09-16 (×3): 12.5 g via INTRAVENOUS
  Filled 2023-09-16: qty 250

## 2023-09-16 MED ORDER — GLYCOPYRROLATE 0.2 MG/ML IJ SOLN
INTRAMUSCULAR | Status: DC | PRN
  Administered 2023-09-16 (×2): .2 mg via INTRAVENOUS

## 2023-09-16 MED ORDER — GABAPENTIN 300 MG PO CAPS
600.0000 mg | ORAL_CAPSULE | Freq: Two times a day (BID) | ORAL | Status: DC
  Administered 2023-09-17 – 2023-09-18 (×3): 600 mg via ORAL
  Filled 2023-09-16 (×3): qty 2

## 2023-09-16 MED ORDER — HEMOSTATIC AGENTS (NO CHARGE) OPTIME
TOPICAL | Status: DC | PRN
  Administered 2023-09-16 (×2): 1 via TOPICAL

## 2023-09-16 MED ORDER — METOPROLOL TARTRATE 5 MG/5ML IV SOLN
2.5000 mg | INTRAVENOUS | Status: DC | PRN
  Administered 2023-09-19: 5 mg via INTRAVENOUS
  Filled 2023-09-16: qty 5

## 2023-09-16 MED ORDER — METOPROLOL TARTRATE 12.5 MG HALF TABLET
12.5000 mg | ORAL_TABLET | Freq: Two times a day (BID) | ORAL | Status: DC
  Administered 2023-09-17: 12.5 mg via ORAL
  Filled 2023-09-16: qty 1

## 2023-09-16 MED ORDER — ALBUMIN HUMAN 5 % IV SOLN: INTRAVENOUS | Status: DC | PRN

## 2023-09-16 MED ORDER — FENTANYL CITRATE (PF) 250 MCG/5ML IJ SOLN
INTRAMUSCULAR | Status: AC
Start: 2023-09-16 — End: 2023-09-16
  Filled 2023-09-16: qty 5

## 2023-09-16 MED ORDER — ASPIRIN 325 MG PO TBEC
325.0000 mg | DELAYED_RELEASE_TABLET | Freq: Every day | ORAL | Status: DC
  Administered 2023-09-17 – 2023-09-19 (×3): 325 mg via ORAL
  Filled 2023-09-16 (×3): qty 1

## 2023-09-16 MED ORDER — ROCURONIUM BROMIDE 10 MG/ML (PF) SYRINGE
PREFILLED_SYRINGE | INTRAVENOUS | Status: DC | PRN
  Administered 2023-09-16: 30 mg via INTRAVENOUS
  Administered 2023-09-16 (×3): 50 mg via INTRAVENOUS
  Administered 2023-09-16: 20 mg via INTRAVENOUS

## 2023-09-16 MED ORDER — BISACODYL 10 MG RE SUPP: 10.0000 mg | Freq: Every day | RECTAL | Status: DC

## 2023-09-16 MED ORDER — CHLORHEXIDINE GLUCONATE 4 % EX SOLN: 30.0000 mL | CUTANEOUS | Status: DC

## 2023-09-16 MED ORDER — HEPARIN SODIUM (PORCINE) 1000 UNIT/ML IJ SOLN
INTRAMUSCULAR | Status: AC
  Filled 2023-09-16: qty 10

## 2023-09-16 MED ORDER — TRAZODONE HCL 100 MG PO TABS
100.0000 mg | ORAL_TABLET | Freq: Every day | ORAL | Status: DC
  Administered 2023-09-16 – 2023-09-20 (×5): 100 mg via ORAL
  Filled 2023-09-16 (×6): qty 1

## 2023-09-16 MED ORDER — DEXTROSE 50 % IV SOLN: 0.0000 mL | INTRAVENOUS | Status: DC | PRN

## 2023-09-16 MED ORDER — METOPROLOL TARTRATE 25 MG/10 ML ORAL SUSPENSION: 12.5000 mg | Freq: Two times a day (BID) | ORAL | Status: DC

## 2023-09-16 MED ORDER — SODIUM CHLORIDE 0.9 % IV SOLN: INTRAVENOUS | Status: AC

## 2023-09-16 MED ORDER — ACETAMINOPHEN 325 MG PO TABS
650.0000 mg | ORAL_TABLET | Freq: Once | ORAL | Status: AC
  Administered 2023-09-16: 650 mg via ORAL
  Filled 2023-09-16: qty 2

## 2023-09-16 MED ORDER — SODIUM CHLORIDE 0.9% FLUSH
3.0000 mL | Freq: Two times a day (BID) | INTRAVENOUS | Status: DC
  Administered 2023-09-17 – 2023-09-19 (×5): 3 mL via INTRAVENOUS

## 2023-09-16 MED ORDER — EPHEDRINE 5 MG/ML INJ
INTRAVENOUS | Status: AC
  Filled 2023-09-16: qty 10

## 2023-09-16 MED ORDER — BISACODYL 5 MG PO TBEC
10.0000 mg | DELAYED_RELEASE_TABLET | Freq: Every day | ORAL | Status: DC
  Administered 2023-09-17 – 2023-09-21 (×4): 10 mg via ORAL
  Filled 2023-09-16 (×5): qty 2

## 2023-09-16 MED ORDER — EPHEDRINE SULFATE (PRESSORS) 50 MG/ML IJ SOLN
INTRAMUSCULAR | Status: DC | PRN
  Administered 2023-09-16 (×5): 5 mg via INTRAVENOUS

## 2023-09-16 MED ORDER — MIRABEGRON ER 25 MG PO TB24
25.0000 mg | ORAL_TABLET | Freq: Every day | ORAL | Status: DC
  Administered 2023-09-17 – 2023-09-21 (×5): 25 mg via ORAL
  Filled 2023-09-16 (×5): qty 1

## 2023-09-16 MED ORDER — OXYCODONE HCL 5 MG PO TABS
5.0000 mg | ORAL_TABLET | ORAL | Status: DC | PRN
  Administered 2023-09-16 – 2023-09-18 (×8): 10 mg via ORAL
  Administered 2023-09-18 – 2023-09-21 (×2): 5 mg via ORAL
  Filled 2023-09-16 (×2): qty 2
  Filled 2023-09-16 (×2): qty 1
  Filled 2023-09-16 (×3): qty 2
  Filled 2023-09-16: qty 1
  Filled 2023-09-16 (×2): qty 2
  Filled 2023-09-16 (×2): qty 1

## 2023-09-16 MED ORDER — ACETAMINOPHEN 160 MG/5ML PO SOLN: 650.0000 mg | Freq: Once | ORAL | Status: DC

## 2023-09-16 MED ORDER — DOCUSATE SODIUM 100 MG PO CAPS
200.0000 mg | ORAL_CAPSULE | Freq: Every day | ORAL | Status: DC
  Administered 2023-09-17 – 2023-09-21 (×4): 200 mg via ORAL
  Filled 2023-09-16 (×5): qty 2

## 2023-09-16 MED ORDER — CEFAZOLIN SODIUM-DEXTROSE 2-4 GM/100ML-% IV SOLN
2.0000 g | Freq: Three times a day (TID) | INTRAVENOUS | Status: AC
Start: 2023-09-16 — End: 2023-09-18
  Administered 2023-09-16 – 2023-09-18 (×6): 2 g via INTRAVENOUS
  Filled 2023-09-16 (×6): qty 100

## 2023-09-16 MED ORDER — BUPROPION HCL ER (XL) 150 MG PO TB24
150.0000 mg | ORAL_TABLET | Freq: Every day | ORAL | Status: DC
  Administered 2023-09-17 – 2023-09-21 (×5): 150 mg via ORAL
  Filled 2023-09-16 (×5): qty 1

## 2023-09-16 MED ORDER — CHLORHEXIDINE GLUCONATE 0.12 % MT SOLN
15.0000 mL | Freq: Once | OROMUCOSAL | Status: AC
  Administered 2023-09-16: 15 mL via OROMUCOSAL
  Filled 2023-09-16: qty 15

## 2023-09-16 MED ORDER — MONTELUKAST SODIUM 10 MG PO TABS
10.0000 mg | ORAL_TABLET | Freq: Every day | ORAL | Status: DC
  Administered 2023-09-17 – 2023-09-20 (×4): 10 mg via ORAL
  Filled 2023-09-16 (×4): qty 1

## 2023-09-16 MED ORDER — ASPIRIN 81 MG PO CHEW: 324.0000 mg | CHEWABLE_TABLET | Freq: Every day | ORAL | Status: DC

## 2023-09-16 MED ORDER — MIDAZOLAM HCL 2 MG/2ML IJ SOLN: 2.0000 mg | INTRAMUSCULAR | Status: DC | PRN

## 2023-09-16 MED ORDER — METOPROLOL TARTRATE 12.5 MG HALF TABLET
12.5000 mg | ORAL_TABLET | Freq: Once | ORAL | Status: AC
  Administered 2023-09-16: 12.5 mg via ORAL
  Filled 2023-09-16: qty 1

## 2023-09-16 MED ORDER — METOCLOPRAMIDE HCL 5 MG/ML IJ SOLN
10.0000 mg | Freq: Four times a day (QID) | INTRAMUSCULAR | Status: AC
  Administered 2023-09-16 – 2023-09-17 (×6): 10 mg via INTRAVENOUS
  Filled 2023-09-16 (×6): qty 2

## 2023-09-16 MED ORDER — PROTAMINE SULFATE 10 MG/ML IV SOLN
INTRAVENOUS | Status: AC
  Filled 2023-09-16: qty 5

## 2023-09-16 MED ORDER — DEXMEDETOMIDINE HCL IN NACL 400 MCG/100ML IV SOLN
0.0000 ug/kg/h | INTRAVENOUS | Status: DC
  Administered 2023-09-16: 0.7 ug/kg/h via INTRAVENOUS

## 2023-09-16 MED ORDER — HEPARIN SODIUM (PORCINE) 1000 UNIT/ML IJ SOLN
INTRAMUSCULAR | Status: AC
  Filled 2023-09-16: qty 1

## 2023-09-16 MED ORDER — ASPIRIN 81 MG PO CHEW
324.0000 mg | CHEWABLE_TABLET | Freq: Once | ORAL | Status: AC
  Administered 2023-09-16: 324 mg via ORAL
  Filled 2023-09-16: qty 4

## 2023-09-16 MED ORDER — ONDANSETRON HCL 4 MG/2ML IJ SOLN
4.0000 mg | Freq: Four times a day (QID) | INTRAMUSCULAR | Status: DC | PRN
Start: 2023-09-16 — End: 2023-09-21
  Filled 2023-09-16: qty 2

## 2023-09-16 MED ORDER — PROPOFOL 10 MG/ML IV BOLUS
INTRAVENOUS | Status: AC
  Filled 2023-09-16: qty 20

## 2023-09-16 MED ORDER — PHENYLEPHRINE 80 MCG/ML (10ML) SYRINGE FOR IV PUSH (FOR BLOOD PRESSURE SUPPORT)
PREFILLED_SYRINGE | INTRAVENOUS | Status: DC | PRN
  Administered 2023-09-16 (×2): 40 ug via INTRAVENOUS
  Administered 2023-09-16 (×2): 80 ug via INTRAVENOUS

## 2023-09-16 MED ORDER — MIDAZOLAM HCL (PF) 10 MG/2ML IJ SOLN
INTRAMUSCULAR | Status: AC
  Filled 2023-09-16: qty 2

## 2023-09-16 MED ORDER — PLASMA-LYTE A IV SOLN
INTRAVENOUS | Status: DC | PRN
  Administered 2023-09-16: 500 mL via INTRAVASCULAR

## 2023-09-16 MED ORDER — INSULIN REGULAR(HUMAN) IN NACL 100-0.9 UT/100ML-% IV SOLN
INTRAVENOUS | Status: DC
  Administered 2023-09-16: 1.5 [IU]/h via INTRAVENOUS

## 2023-09-16 MED ORDER — PANTOPRAZOLE SODIUM 40 MG PO TBEC
40.0000 mg | DELAYED_RELEASE_TABLET | Freq: Every day | ORAL | Status: DC
  Administered 2023-09-18 – 2023-09-21 (×4): 40 mg via ORAL
  Filled 2023-09-16 (×4): qty 1

## 2023-09-16 MED ORDER — ACETAMINOPHEN 160 MG/5ML PO SOLN: 1000.0000 mg | Freq: Four times a day (QID) | ORAL | Status: DC

## 2023-09-16 MED ORDER — HEPARIN SODIUM (PORCINE) 1000 UNIT/ML IJ SOLN
INTRAMUSCULAR | Status: DC | PRN
Start: 2023-09-16 — End: 2023-09-16
  Administered 2023-09-16: 13000 [IU] via INTRAVENOUS
  Administered 2023-09-16: 10000 [IU] via INTRAVENOUS
  Administered 2023-09-16 (×2): 5000 [IU] via INTRAVENOUS
  Administered 2023-09-16: 9000 [IU] via INTRAVENOUS

## 2023-09-16 MED ORDER — NICARDIPINE HCL IN NACL 20-0.86 MG/200ML-% IV SOLN: 0.0000 mg/h | INTRAVENOUS | Status: DC

## 2023-09-16 MED ORDER — ROSUVASTATIN CALCIUM 20 MG PO TABS
40.0000 mg | ORAL_TABLET | Freq: Every day | ORAL | Status: DC
  Administered 2023-09-17 – 2023-09-20 (×4): 40 mg via ORAL
  Filled 2023-09-16 (×4): qty 2

## 2023-09-16 MED ORDER — MIDAZOLAM HCL (PF) 5 MG/ML IJ SOLN
INTRAMUSCULAR | Status: DC | PRN
  Administered 2023-09-16: 5 mg via INTRAVENOUS
  Administered 2023-09-16: 1 mg via INTRAVENOUS

## 2023-09-16 SURGICAL SUPPLY — 82 items
ADAPTER MULTI PERFUSION 15 (ADAPTER) IMPLANT
BAG DECANTER FOR FLEXI CONT (MISCELLANEOUS) ×4 IMPLANT
BLADE CLIPPER SURG (BLADE) ×2 IMPLANT
BLADE STERNUM SYSTEM 6 (BLADE) ×2 IMPLANT
BLOWER MISTER CAL-MED (MISCELLANEOUS) ×2 IMPLANT
BNDG ELASTIC 4INX 5YD STR LF (GAUZE/BANDAGES/DRESSINGS) IMPLANT
BNDG ELASTIC 4X5.8 VLCR STR LF (GAUZE/BANDAGES/DRESSINGS) ×2 IMPLANT
BNDG ELASTIC 6INX 5YD STR LF (GAUZE/BANDAGES/DRESSINGS) ×2 IMPLANT
BNDG GAUZE DERMACEA FLUFF 4 (GAUZE/BANDAGES/DRESSINGS) ×2 IMPLANT
CABLE SURGICAL S-101-97-12 (CABLE) IMPLANT
CANISTER SUCTION 3000ML PPV (SUCTIONS) ×2 IMPLANT
CANNULA AORTIC ROOT 9FR (CANNULA) IMPLANT
CANNULA MC2 2 STG 29/37 NON-V (CANNULA) ×2 IMPLANT
CANNULA NON VENT 20FR 12 (CANNULA) ×2 IMPLANT
CATH ROBINSON RED A/P 18FR (CATHETERS) IMPLANT
CLIP RETRACTION 3.0MM CORONARY (MISCELLANEOUS) ×2 IMPLANT
CLIP TI MEDIUM 24 (CLIP) IMPLANT
CLIP TI WIDE RED SMALL 24 (CLIP) IMPLANT
CONN ST 1/2X1/2 BEN (MISCELLANEOUS) ×2 IMPLANT
CONNECTOR BLAKE 2:1 CARIO BLK (MISCELLANEOUS) IMPLANT
DERMABOND ADVANCED .7 DNX12 (GAUZE/BANDAGES/DRESSINGS) IMPLANT
DRAIN CHANNEL 19F RND (DRAIN) IMPLANT
DRAPE INCISE IOBAN 66X45 STRL (DRAPES) ×2 IMPLANT
DRAPE SRG 135X102X78XABS (DRAPES) ×2 IMPLANT
DRAPE WARM FLUID 44X44 (DRAPES) ×2 IMPLANT
DRESSING AQUACEL AG SP 3.5X10 (GAUZE/BANDAGES/DRESSINGS) IMPLANT
DRSG COVADERM 4X14 (GAUZE/BANDAGES/DRESSINGS) ×2 IMPLANT
ELECTRODE REM PT RTRN 9FT ADLT (ELECTROSURGICAL) ×4 IMPLANT
FELT TEFLON 1X6 (MISCELLANEOUS) ×4 IMPLANT
GAUZE 4X4 16PLY ~~LOC~~+RFID DBL (SPONGE) ×2 IMPLANT
GAUZE SPONGE 4X4 12PLY STRL (GAUZE/BANDAGES/DRESSINGS) ×4 IMPLANT
GLOVE BIO SURGEON STRL SZ7 (GLOVE) ×4 IMPLANT
GLOVE BIOGEL PI IND STRL 7.5 (GLOVE) ×4 IMPLANT
GOWN STRL REUS W/ TWL LRG LVL3 (GOWN DISPOSABLE) ×8 IMPLANT
GOWN STRL REUS W/ TWL XL LVL3 (GOWN DISPOSABLE) ×4 IMPLANT
HEMOSTAT POWDER SURGIFOAM 1G (HEMOSTASIS) ×6 IMPLANT
HEMOSTAT SURGICEL 2X14 (HEMOSTASIS) IMPLANT
INSERT FOGARTY XLG (MISCELLANEOUS) IMPLANT
INSERT SUTURE HOLDER (MISCELLANEOUS) ×2 IMPLANT
KIT BASIN OR (CUSTOM PROCEDURE TRAY) ×2 IMPLANT
KIT SUCTION CATH 14FR (SUCTIONS) ×6 IMPLANT
KIT TURNOVER KIT B (KITS) ×2 IMPLANT
KIT VASOVIEW HEMOPRO 2 VH 4000 (KITS) ×2 IMPLANT
LEAD PACING MYOCARDI (MISCELLANEOUS) ×2 IMPLANT
MARKER GRAFT CORONARY BYPASS (MISCELLANEOUS) ×6 IMPLANT
NS IRRIG 1000ML POUR BTL (IV SOLUTION) ×10 IMPLANT
OFFPUMP STABILIZER SUV (MISCELLANEOUS) ×2 IMPLANT
PACK E OPEN HEART (SUTURE) ×2 IMPLANT
PACK OPEN HEART (CUSTOM PROCEDURE TRAY) ×2 IMPLANT
PAD ARMBOARD POSITIONER FOAM (MISCELLANEOUS) ×4 IMPLANT
PAD ELECT DEFIB RADIOL ZOLL (MISCELLANEOUS) ×2 IMPLANT
PENCIL BUTTON HOLSTER BLD 10FT (ELECTRODE) ×2 IMPLANT
POSITIONER ACROBAT-I OFFPUMP (MISCELLANEOUS) ×2 IMPLANT
POSITIONER HEAD DONUT 9IN (MISCELLANEOUS) ×2 IMPLANT
PUNCH AORTIC ROTATE 4.5MM 8IN (MISCELLANEOUS) IMPLANT
SEALANT HEMOST PREVELEAK 4ML (HEMOSTASIS) IMPLANT
SPONGE T-LAP 18X18 ~~LOC~~+RFID (SPONGE) ×8 IMPLANT
STOPCOCK 4 WAY LG BORE MALE ST (IV SETS) IMPLANT
SUPPORT HEART JANKE-BARRON (MISCELLANEOUS) ×2 IMPLANT
SUT BONE WAX W31G (SUTURE) ×2 IMPLANT
SUT MNCRL AB 3-0 PS2 18 (SUTURE) ×4 IMPLANT
SUT MNCRL AB 4-0 PS2 18 (SUTURE) IMPLANT
SUT PDS AB 1 CTX 36 (SUTURE) ×4 IMPLANT
SUT PROLENE 3 0 SH DA (SUTURE) ×2 IMPLANT
SUT PROLENE 4-0 RB1 .5 CRCL 36 (SUTURE) IMPLANT
SUT PROLENE 5 0 C 1 36 (SUTURE) ×6 IMPLANT
SUT PROLENE 7 0 BV 1 (SUTURE) IMPLANT
SUT PROLENE BLUE 7 0 (SUTURE) ×2 IMPLANT
SUT STEEL 6MS V (SUTURE) ×4 IMPLANT
SUT VIC AB 2-0 CT1 TAPERPNT 27 (SUTURE) IMPLANT
SYSTEM SAHARA CHEST DRAIN ATS (WOUND CARE) ×2 IMPLANT
TAPE CLOTH SURG 4X10 WHT LF (GAUZE/BANDAGES/DRESSINGS) IMPLANT
TAPE PAPER 2X10 WHT MICROPORE (GAUZE/BANDAGES/DRESSINGS) IMPLANT
TAPES RETRACTO (MISCELLANEOUS) ×4 IMPLANT
TOWEL GREEN STERILE (TOWEL DISPOSABLE) ×2 IMPLANT
TOWEL GREEN STERILE FF (TOWEL DISPOSABLE) ×2 IMPLANT
TRAY FOLEY SLVR 16FR TEMP STAT (SET/KITS/TRAYS/PACK) ×2 IMPLANT
TUBE SUCT INTRACARD DLP 20F (MISCELLANEOUS) IMPLANT
TUBE SUCTION CARDIAC 10FR (CANNULA) IMPLANT
TUBING LAP HI FLOW INSUFFLATIO (TUBING) ×2 IMPLANT
UNDERPAD 30X36 HEAVY ABSORB (UNDERPADS AND DIAPERS) ×2 IMPLANT
WATER STERILE IRR 1000ML POUR (IV SOLUTION) ×4 IMPLANT

## 2023-09-16 NOTE — H&P (Signed)
 [address]       [place] [address]             [id number]                                        Allen Tapia [facility] Medical Record #[id number] Date of Birth: 19-Oct-1955   Referring: Diedra Lame, MD Primary Care: Diedra Lame, MD Primary Cardiologist:Bridgette Lonni, MD   Chief Complaint:        Chief Complaint  Patient presents with   Coronary Artery Disease      New patient consultation, CATH 5/2, ECHO 5/2      History of Present Illness:     Allen Tapia 68 y.o. male presents for surgical evaluation of LAD disease.  He has a strong family history of coronary disease.  He describes occasional atypical chest pain that he describes as a mild thumping that comes and goes without provocation.  He remains very active, and does not have any chest pressure, or shortness of breath.  Most of his mobility issues are secondary to his MS and knee surgeries.     Past Medical and Surgical History: Previous Chest Surgery: no Previous Chest Radiation: no Diabetes Mellitus: yes.  HbA1C 6.1 Creatinine:  Recent Labs       Lab Results  Component Value Date    CREATININE 0.86 07/24/2023    CREATININE 0.73 08/13/2022    CREATININE 0.81 06/13/2022              Past Medical History:  Diagnosis Date   Anginal pain (HCC)      atypical chest pain cardiac cath performed at that time   Arthritis     Asthma     Atypical chest pain     Barrett's esophagus     Bilateral cataracts     BPH (benign prostatic hyperplasia)     Chicken pox     Chronic constipation     Coronary artery disease 07/14/2007    a.) LHC 07/14/2007: 40% pRCA-1, 40% pRCA-2, 40% dRCA, 20% RPL1, 30% LM, 20% mLCx-1, 40% mLCx-2, 20% OM3-1, 20% OM3-2, 20% pLAD, 40% mLAD-1, 70% mLAD-2, 40% mLAD-3, 30% dLAD, 40% D1, 40% D2 --> med mgmt.   Depression     Foreign body (FB) in soft tissue     Former tobacco use     GERD (gastroesophageal reflux disease)     History of 2019  novel coronavirus disease (COVID-19) 04/07/2019    a.) tested (+) at [facility] 04/16/2019, 05/30/2021; b.) 2 other (+) tests at home (dates unknown)   History of kidney stones     Hyperlipidemia     Hyperplastic colon polyp     Infrarenal abdominal aortic aneurysm (AAA) without rupture (HCC)      a.) MR lumbar spine 12/30/2020: infrarenal saccular AAA measuring 2.2 cm; b.) CTA A/P 01/05/2021: measured 2.6 cm; c.) s/p EVAR 03/03/2021   Ischemic heart disease 12/20/2010   Long term current use of immunosuppressive drug      a.) on ofatumumab for MS   MS (multiple sclerosis) (HCC)      a.) on ofatumumab   Shingles     Tubular adenoma of colon 08/23/2016               Past Surgical History:  Procedure Laterality Date   ABDOMINAL  AORTIC ENDOVASCULAR STENT GRAFT N/A 03/03/2021    Procedure: ABDOMINAL AORTIC ENDOVASCULAR STENT GRAFT;  Surgeon: Sheree Penne Bruckner, MD;  Location: [facility] OR;  Service: Vascular;  Laterality: N/A;   ADENOIDECTOMY   1960   APPENDECTOMY   1979   CATARACT EXTRACTION, BILATERAL   03/12/2019   COLONOSCOPY   02/05/2008   COLONOSCOPY WITH PROPOFOL  N/A 08/23/2016    Procedure: COLONOSCOPY WITH PROPOFOL ;  Surgeon: Gaylyn Gladis PENNER, MD;  Location: [facility] ENDOSCOPY;  Service: Endoscopy;  Laterality: N/A;   COLONOSCOPY WITH PROPOFOL  N/A 04/29/2020    Procedure: COLONOSCOPY WITH PROPOFOL ;  Surgeon: Maryruth Ole DASEN, MD;  Location: [facility] ENDOSCOPY;  Service: Endoscopy;  Laterality: N/A;   CYSTOSCOPY WITH INSERTION OF UROLIFT N/A 05/20/2020    Procedure: CYSTOSCOPY WITH INSERTION OF UROLIFT;  Surgeon: Francisca Redell BROCKS, MD;  Location: [facility] ORS;  Service: Urology;  Laterality: N/A;   ESOPHAGOGASTRODUODENOSCOPY (EGD) WITH PROPOFOL  N/A 08/23/2016    Procedure: ESOPHAGOGASTRODUODENOSCOPY (EGD) WITH PROPOFOL ;  Surgeon: Gaylyn Gladis PENNER, MD;  Location: [facility] ENDOSCOPY;  Service: Endoscopy;  Laterality: N/A;   ESOPHAGOGASTRODUODENOSCOPY (EGD) WITH PROPOFOL  N/A 04/29/2020     Procedure: ESOPHAGOGASTRODUODENOSCOPY (EGD) WITH PROPOFOL ;  Surgeon: Maryruth Ole DASEN, MD;  Location: [facility] ENDOSCOPY;  Service: Endoscopy;  Laterality: N/A;   ESOPHAGOGASTRODUODENOSCOPY ENDOSCOPY   02/05/2008   EYE SURGERY       eye trauma   1970    piece of metal to eye removed in clinic   JOINT REPLACEMENT       KNEE ARTHROPLASTY Right 10/05/2020    Procedure: COMPUTER ASSISTED TOTAL KNEE ARTHROPLASTY;  Surgeon: Mardee Lynwood SQUIBB, MD;  Location: [facility] ORS;  Service: Orthopedics;  Laterality: Right;   KNEE ARTHROPLASTY Left 06/08/2022    Procedure: COMPUTER ASSISTED TOTAL KNEE ARTHROPLASTY;  Surgeon: Mardee Lynwood SQUIBB, MD;  Location: [facility] ORS;  Service: Orthopedics;  Laterality: Left;   KNEE ARTHROSCOPY Left 12/11/2021    Procedure: ARTHROSCOPY KNEE;  Surgeon: Mardee Lynwood SQUIBB, MD;  Location: [facility] ORS;  Service: Orthopedics;  Laterality: Left;   KNEE ARTHROSCOPY W/ PARTIAL MEDIAL MENISCECTOMY Right 01/02/2011   LEFT HEART CATH AND CORONARY ANGIOGRAPHY Left 07/14/2007    Procedure: LEFT HEART CATH AND CORONARY ANGIOGRAPHY; Location: [facility]; Surgeon: Miquel Ellen, MD   LEFT HEART CATH AND CORONARY ANGIOGRAPHY N/A 07/26/2023    Procedure: LEFT HEART CATH AND CORONARY ANGIOGRAPHY;  Surgeon: Verlin Bruckner BIRCH, MD;  Location: [facility] INVASIVE CV LAB;  Service: Cardiovascular;  Laterality: N/A;   SEPTOPLASTY       TONSILLECTOMY   02/05/2008   ULTRASOUND GUIDANCE FOR VASCULAR ACCESS Left 03/03/2021    Procedure: ULTRASOUND GUIDANCE FOR VASCULAR ACCESS, LEFT FEMORAL ARTERY;  Surgeon: Sheree Penne Bruckner, MD;  Location: [facility] OR;  Service: Vascular;  Laterality: Left;   WRIST FRACTURE SURGERY Right            Social History:   Tobacco Use History  Social History        Tobacco Use  Smoking Status Former   Current packs/day: 0.00   Average packs/day: 1 pack/day for 32.5 years (32.5 ttl pk-yrs)   Types: Cigarettes   Start date: 13   Quit date: 09/23/2006   Years since quitting: 16.8   Smokeless Tobacco Never      Social History       Substance and Sexual Activity  Alcohol  Use No        Allergies  No Known Allergies  Current Outpatient Medications  Medication Sig Dispense Refill   acetaminophen  (TYLENOL ) 500 MG tablet Take 2 tablets (1,000 mg total) by mouth every 6 (six) hours as needed for mild pain. 60 tablet 0   amLODipine  (NORVASC ) 5 MG tablet Take 1 tablet (5 mg total) by mouth daily. 90 tablet 3   amphetamine -dextroamphetamine (ADDERALL XR) 25 MG 24 hr capsule Take 25 mg by mouth daily as needed (focus).       aspirin  EC 81 MG tablet Take 81 mg by mouth daily. Swallow whole.       baclofen  (LIORESAL ) 20 MG tablet Take 20 mg by mouth 3 (three) times daily.       BREZTRI  AEROSPHERE 160-9-4.8 MCG/ACT AERO USE 2 INHALATIONS IN THE MORNING AND AT BEDTIME 32.1 g 3   celecoxib  (CELEBREX ) 200 MG capsule Take 200 mg by mouth 2 (two) times daily.       gabapentin  (NEURONTIN ) 300 MG capsule Take 1-2 capsules (300-600 mg total) by mouth 2 (two) times daily. 600 mg in am and 300 mg in afternoon (Patient taking differently: Take 600 mg by mouth 2 (two) times daily. 600 in the am and 600 in the pm) 60 capsule 0   isosorbide  mononitrate (IMDUR ) 30 MG 24 hr tablet Take 1 tablet (30 mg total) by mouth daily. (Patient taking differently: Take 15 mg by mouth daily.) 90 tablet 3   montelukast  (SINGULAIR ) 10 MG tablet TAKE 1 TABLET AT BEDTIME 90 tablet 3   Multiple Vitamins-Minerals (MULTIVITAMIN WITH MINERALS) tablet Take 1 tablet by mouth daily.       Ofatumumab (KESIMPTA) 20 MG/0.4ML SOAJ Inject 20 mg into the skin every 30 (thirty) days. 1st day of every month       pantoprazole  (PROTONIX ) 20 MG tablet Take 20 mg by mouth at bedtime.       Polyethyl Glycol-Propyl Glycol (SYSTANE) 0.4-0.3 % SOLN Apply 1 drop to eye 2 (two) times daily.       rosuvastatin  (CRESTOR ) 20 MG tablet TAKE 1 TABLET AT BEDTIME 90 tablet 3   Spacer/Aero-Holding Chambers (AEROCHAMBER MV)  inhaler Use as instructed 1 each 0   tamsulosin  (FLOMAX ) 0.4 MG CAPS capsule TAKE 1 CAPSULE DAILY 90 capsule 3   traZODone  (DESYREL ) 100 MG tablet Take 100 mg by mouth at bedtime.       VENTOLIN  HFA 108 (90 Base) MCG/ACT inhaler USE 2 INHALATIONS EVERY 6 HOURS AS NEEDED FOR WHEEZING OR SHORTNESS OF BREATH 54 g 3   Vibegron  (GEMTESA ) 75 MG TABS Take 1 tablet (75 mg total) by mouth daily. 90 tablet 3   VITAMIN D  PO Take 1 capsule by mouth daily.          No current facility-administered medications for this visit.         (Not in a hospital admission)              Family History  Problem Relation Age of Onset   Heart disease Mother     Glaucoma Mother     Irregular heart beat Mother     Cardiomyopathy Mother     Colon polyps Mother     Cataracts Mother     Hypertension Father     Heart disease Father     Cancer Father     Esophageal cancer Cousin              Review of Systems:    Review of Systems  Constitutional:  Negative for malaise/fatigue and weight loss.  Respiratory:  Negative for shortness of breath.   Cardiovascular:  Positive for chest pain.                   Physical Exam: BP 109/67 (BP Location: Right Arm, Patient Position: Sitting, Cuff Size: Normal)   Pulse 74   Resp 20   Ht 6' (1.829 m)   Wt 199 lb (90.3 kg)   SpO2 94% Comment: RA  BMI 26.99 kg/m  Physical Exam Constitutional:      General: He is not in acute distress. HENT:     Head: Normocephalic and atraumatic.  Eyes:     Extraocular Movements: Extraocular movements intact.  Cardiovascular:     Rate and Rhythm: Normal rate.  Pulmonary:     Effort: Pulmonary effort is normal. No respiratory distress.  Abdominal:     General: There is no distension.  Musculoskeletal:        General: Normal range of motion.     Cervical back: Normal range of motion.  Skin:    General: Skin is warm and dry.  Neurological:     General: No focal deficit present.     Mental Status: He is alert and  oriented to person, place, and time.          Diagnostic Studies & Laboratory data:    Left Heart Catherization:  Intervention   Echo: IMPRESSIONS     1. Left ventricular ejection fraction, by estimation, is 60 to 65%. Left  ventricular ejection fraction by 3D volume is 62 %. The left ventricle has  normal function. The left ventricle has no regional wall motion  abnormalities. Left ventricular diastolic   parameters are consistent with Grade I diastolic dysfunction (impaired  relaxation). The average left ventricular global longitudinal strain is  -26.7 %. The global longitudinal strain is normal.   2. Right ventricular systolic function is normal. The right ventricular  size is normal.   3. The mitral valve is normal in structure. Mild mitral valve  regurgitation. No evidence of mitral stenosis.   4. The aortic valve is tricuspid. Aortic valve regurgitation is mild. No  aortic stenosis is present.   5. Aortic dilatation noted. There is mild dilatation of the aortic root,  measuring 45 mm. There is mild dilatation of the ascending aorta,  measuring 43 mm.   6. The inferior vena cava is normal in size with greater than 50%  respiratory variability, suggesting right atrial pressure of 3 mmHg.      EKG: Sinus   I have independently reviewed the above radiologic studies and discussed with the patient    Recent Lab Findings: Recent Labs       Lab Results  Component Value Date    WBC 5.8 07/24/2023    HGB 14.1 07/24/2023    HCT 41.2 07/24/2023    PLT 217 07/24/2023    GLUCOSE 91 07/24/2023    ALT 24 06/06/2022    AST 27 06/06/2022    NA 143 07/24/2023    K 4.2 07/24/2023    CL 105 07/24/2023    CREATININE 0.86 07/24/2023    BUN 14 07/24/2023    CO2 22 07/24/2023    INR 0.9 03/03/2021    HGBA1C 6.1 (H) 06/06/2022            Assessment / Plan:   68yo male with complex LAD disease.  Echocardiogram shows preserved biventricular function and no significant  valvular disease.  His case was discussed in Cath conference.  Due to concerns for competitive flow if the diagonal vessel is stented, he would not be a candidate for a hybrid MIDCAB.  We will proceed with an off-pump CABG 2.

## 2023-09-16 NOTE — Anesthesia Procedure Notes (Addendum)
 Central Venous Catheter Insertion Performed by: anesthesiologist Start/End6/23/2025 7:29 AM Patient location: Pre-op. Preanesthetic checklist: patient identified, IV checked, site marked, risks and benefits discussed, surgical consent, monitors and equipment checked, pre-op evaluation, timeout performed and anesthesia consent Position: Trendelenburg Lidocaine  1% used for infiltration and patient sedated Hand hygiene performed  and maximum sterile barriers used  Catheter size: 8.5 Fr MAC introducer Procedure performed using ultrasound guided technique. Ultrasound Notes:anatomy identified, needle tip was noted to be adjacent to the nerve/plexus identified, no ultrasound evidence of intravascular and/or intraneural injection and image(s) printed for medical record Attempts: 1 Following insertion, dressing applied and line sutured (CHG tegaderm). Post procedure assessment: blood return through all ports, free fluid flow and no air  Patient tolerated the procedure well with no immediate complications.

## 2023-09-16 NOTE — Procedures (Signed)
 Extubation Procedure Note  Patient Details:   Name: Allen Tapia DOB: 05-19-1955 MRN: [id number]   Airway Documentation:    Vent end date: 09/16/23 Vent end time: 1555   Evaluation  O2 sats: stable throughout Complications: No apparent complications Patient did tolerate procedure well. Bilateral Breath Sounds: Clear, Diminished   Yes  Pt was extubated per MD order and placed on 4 L [place]. Cuff leak was noted prior to extubation and no stridor post. Pt is stable at this time.   Tabithia Stroder 09/16/2023, 3:57 PM

## 2023-09-16 NOTE — Anesthesia Procedure Notes (Addendum)
 Procedure Name: Intubation Date/Time: 09/16/2023 8:02 AM  Performed by: Vertie Arthea RAMAN, CRNAPre-anesthesia Checklist: Patient identified, Emergency Drugs available, Suction available and Patient being monitored Patient Re-evaluated:Patient Re-evaluated prior to induction Oxygen Delivery Method: Circle system utilized Preoxygenation: Pre-oxygenation with 100% oxygen Induction Type: IV induction Ventilation: Mask ventilation without difficulty Laryngoscope Size: Mac and 4 Grade View: Grade I Tube type: Oral Number of attempts: 1 Airway Equipment and Method: Stylet and Oral airway Placement Confirmation: ETT inserted through vocal cords under direct vision, positive ETCO2 and breath sounds checked- equal and bilateral Secured at: 23 cm Tube secured with: Tape Dental Injury: Teeth and Oropharynx as per pre-operative assessment

## 2023-09-16 NOTE — Anesthesia Procedure Notes (Signed)
 Central Venous Catheter Insertion Performed by: Patrisha Bernardino SQUIBB, MD, anesthesiologist Start/End6/23/2025 7:15 AM, 09/16/2023 7:30 AM Patient location: Pre-op. Preanesthetic checklist: patient identified, IV checked, site marked, risks and benefits discussed, surgical consent, monitors and equipment checked, pre-op evaluation, timeout performed and anesthesia consent Position: Trendelenburg Lidocaine  1% used for infiltration and patient sedated Hand hygiene performed , maximum sterile barriers used  and Seldinger technique used Catheter size: 8.5 Fr Total catheter length 10. Sheath introducer Procedure performed using ultrasound guided technique. Ultrasound Notes:anatomy identified, needle tip was noted to be adjacent to the nerve/plexus identified, no ultrasound evidence of intravascular and/or intraneural injection and image(s) printed for medical record Attempts: 1 Following insertion, line sutured and dressing applied. Post procedure assessment: blood return through all ports, free fluid flow and no air  Patient tolerated the procedure well with no immediate complications. Additional procedure comments: Arrow three-lumen CVC inserted.

## 2023-09-16 NOTE — Anesthesia Procedure Notes (Addendum)
 Arterial Line Insertion Start/End6/23/2025 6:54 AM  Patient location: Pre-op. Preanesthetic checklist: patient identified, IV checked, site marked, risks and benefits discussed, surgical consent, monitors and equipment checked, pre-op evaluation, timeout performed and anesthesia consent Lidocaine  1% used for infiltration Left, radial was placed Catheter size: 20 G Hand hygiene performed  and maximum sterile barriers used   Attempts: 1 Procedure performed using ultrasound guided technique. Ultrasound Notes:anatomy identified, needle tip was noted to be adjacent to the nerve/plexus identified and no ultrasound evidence of intravascular and/or intraneural injection Following insertion, dressing applied and Biopatch. Post procedure assessment: normal and unchanged  Patient tolerated the procedure well with no immediate complications.

## 2023-09-16 NOTE — Consult Note (Addendum)
 NAME:  Allen Tapia, MRN:  [id number], DOB:  04/13/1955, LOS: 0 ADMISSION DATE:  09/16/2023, CONSULTATION DATE:  6/23  REFERRING MD:  lightfoot, CHIEF COMPLAINT:  post-op icu care    History of Present Illness:  68 year old male prior smoker referred to cardiac surg for complex LAD disease. Had left heart cath 5/2 showed 90% lesion of LAD, 70% lesion in diag, 20% RCA. To [facility] 6/23 for planned CABG X2 (harvesting the left IM artery and Right SV)  Bypass time ~ 1 hr  EBL 1209 Cell saver 705 1700 crystalloid  500 albumin Presented to the ICU post op.  Initial gtts on arrival epi 1 mcg/min, Neo 10 mcg/min, dex 0.7mcg/min norepi 1 mcg/min  Initial CI 1.7 MAP > 65   Pertinent  Medical History  Multiple sclerosis: [place]  x 5 yrs (since Nov 2013) for MS , Angina, CAD involving LAD, arthritis, asthma, barretts esophagus, bilateral cataracts BPH Prior abd aortic endovascular tent,  Significant Hospital Events: Including procedures, antibiotic start and stop dates in addition to other pertinent events   2V CABG 6/23  Interim History / Subjective:  Sedated  Just getting settled in   Objective    Blood pressure 107/69, pulse (!) 51, temperature 98.3 F (36.8 C), temperature source Oral, resp. rate (!) 9, height 6' (1.829 m), weight 89.8 kg, SpO2 98%.        Intake/Output Summary (Last 24 hours) at 09/16/2023 1217 Last data filed at 09/16/2023 1209 Gross per 24 hour  Intake 3505 ml  Output 830 ml  Net 2675 ml   Filed Weights   09/16/23 0546  Weight: 89.8 kg    Examination: General: otherwise healthy appearing 68 year old male  HENT: NCAT right internal jugular CVL unremarkable  Lungs: clear bilaterally  Cardiovascular: RRR sternal dressing intact drains 20 ml bloody output Abdomen: soft hypoactive  Extremities: warm strong pulses. Right LE wrapped Neuro: sedated  GU: clear yellow   Resolved problem list   Assessment and Plan  CAD s/p 2 V CABG (lightfoot  6/23) Plan Wean pressors for MAP > 65, but avoid SBP >140 Wean inotropes for CI >2.2 PRN fluids per CVTS protocol  Close obs of post-op CT output F/u post op CBC, Chem, abg and CXR Multi-modal pain management  Resume asa and statin when able Complete prophylactic abx  Post op BB Holding CCB for now  Tight glycemic control  Early mobilization   Ventilator management  Plan F/u cxr and abg Rapid wean once normothermic and Mental status allows Sedation w/ dex VAP bundle   H/o asthma  Plan On Breztri  160-994.8 2 puffs bid Will substitute w/ formulary equivalent then resume home rx at dc   Expected post op anemia Plan  Trend Transfuse for hgb < 8  H/o BPH Plan  Resume flomax  when able   H/o MS Plan  Resume baclofen  when able   H/o adhd Plan Resume his adderal XR when able   Best Practice (right click and Reselect all SmartList Selections daily)   Diet/type: NPO DVT prophylaxis other SCD  Pressure ulcer(s): N/A GI prophylaxis: PPI Lines: Central line Foley:  Yes, and it is still needed Code Status:  full code Last date of multidisciplinary goals of care discussion [per primary ]  Labs   CBC: Recent Labs  Lab 09/12/23 1124 09/16/23 0817 09/16/23 1101 09/16/23 1112 09/16/23 1130 09/16/23 1158 09/16/23 1201  WBC 5.3  --   --   --   --   --   --  HGB 14.6   < > 8.8* 8.8* 8.8* 8.5* 9.5*  HCT 44.9   < > 26.7* 26.0* 26.0* 25.0* 28.0*  MCV 95.7  --   --   --   --   --   --   PLT 231  --  162  --   --   --   --    < > = values in this interval not displayed.    Basic Metabolic Panel: Recent Labs  Lab 09/12/23 1124 09/16/23 0817 09/16/23 0923 09/16/23 0926 09/16/23 1019 09/16/23 1049 09/16/23 1055 09/16/23 1112 09/16/23 1130 09/16/23 1158 09/16/23 1201  NA 142 142   < > 141 141   < > 141 141 142 143 142  K 4.3 4.1   < > 4.3 4.4   < > 4.2 4.9 4.3 4.2 4.2  CL 104 105  --  106 106  --   --  105  --   --  106  CO2 32  --   --   --   --   --    --   --   --   --   --   GLUCOSE 110* 120*  --  132* 156*  --   --  143*  --   --  126*  BUN 8 17  --  14 15  --   --  14  --   --  13  CREATININE 0.81 0.70  --  0.70 0.70  --   --  0.60*  --   --  0.70  CALCIUM  9.1  --   --   --   --   --   --   --   --   --   --    < > = values in this interval not displayed.   GFR: Estimated Creatinine Clearance: 97 mL/min (by C-G formula based on SCr of 0.7 mg/dL). Recent Labs  Lab 09/12/23 1124  WBC 5.3    Liver Function Tests: Recent Labs  Lab 09/12/23 1124  AST 30  ALT 29  ALKPHOS 60  BILITOT 0.6  PROT 6.4*  ALBUMIN 4.0   No results for input(s): LIPASE, AMYLASE in the last 168 hours. No results for input(s): AMMONIA in the last 168 hours.  ABG    Component Value Date/Time   PHART 7.345 (L) 09/16/2023 1158   PCO2ART 42.9 09/16/2023 1158   PO2ART 80 (L) 09/16/2023 1158   HCO3 23.4 09/16/2023 1158   TCO2 21 (L) 09/16/2023 1201   ACIDBASEDEF 2.0 09/16/2023 1158   O2SAT 95 09/16/2023 1158     Coagulation Profile: Recent Labs  Lab 09/12/23 1124  INR 0.9    Cardiac Enzymes: No results for input(s): CKTOTAL, CKMB, CKMBINDEX, TROPONINI in the last 168 hours.  HbA1C: Hgb A1c MFr Bld  Date/Time Value Ref Range Status  09/12/2023 11:24 AM 5.8 (H) 4.8 - 5.6 % Final    Comment:    (NOTE) Diagnosis of Diabetes The following HbA1c ranges recommended by the American Diabetes Association (ADA) may be used as an aid in the diagnosis of diabetes mellitus.  Hemoglobin             Suggested A1C NGSP%              Diagnosis  <5.7                   Non Diabetic  5.7-6.4  Pre-Diabetic  >6.4                   Diabetic  <7.0                   Glycemic control for                       adults with diabetes.    06/06/2022 10:54 AM 6.1 (H) 4.8 - 5.6 % Final    Comment:    (NOTE)         Prediabetes: 5.7 - 6.4         Diabetes: >6.4         Glycemic control for adults with diabetes: <7.0      CBG: No results for input(s): GLUCAP in the last 168 hours.  Review of Systems:   Not able   Past Medical History:  He,  has a past medical history of Anginal pain (HCC), Arthritis, Asthma, Atypical chest pain, Barrett's esophagus, Bilateral cataracts, BPH (benign prostatic hyperplasia), Cancer (HCC), Chicken pox, Chronic constipation, COPD (chronic obstructive pulmonary disease) (HCC), Coronary artery disease (07/14/2007), Depression, Foreign body (FB) in soft tissue, Former tobacco use, GERD (gastroesophageal reflux disease), History of 2019 novel coronavirus disease (COVID-19) (04/07/2019), History of kidney stones, Hyperlipidemia, Hyperplastic colon polyp, Hypertension, Infrarenal abdominal aortic aneurysm (AAA) without rupture (HCC), Ischemic heart disease (12/20/2010), Long term current use of immunosuppressive drug, MS (multiple sclerosis) (HCC), Neuromuscular disorder (HCC), Pneumonia, Pre-diabetes, Shingles, and Tubular adenoma of colon (08/23/2016).   Surgical History:   Past Surgical History:  Procedure Laterality Date   ABDOMINAL AORTIC ENDOVASCULAR STENT GRAFT N/A 03/03/2021   Procedure: ABDOMINAL AORTIC ENDOVASCULAR STENT GRAFT;  Surgeon: Sheree Penne Bruckner, MD;  Location: [facility] OR;  Service: Vascular;  Laterality: N/A;   ADENOIDECTOMY  1960   APPENDECTOMY  1979   CATARACT EXTRACTION, BILATERAL  03/12/2019   COLONOSCOPY  02/05/2008   COLONOSCOPY WITH PROPOFOL  N/A 08/23/2016   Procedure: COLONOSCOPY WITH PROPOFOL ;  Surgeon: Gaylyn Gladis PENNER, MD;  Location: [facility] ENDOSCOPY;  Service: Endoscopy;  Laterality: N/A;   COLONOSCOPY WITH PROPOFOL  N/A 04/29/2020   Procedure: COLONOSCOPY WITH PROPOFOL ;  Surgeon: Maryruth Ole DASEN, MD;  Location: [facility] ENDOSCOPY;  Service: Endoscopy;  Laterality: N/A;   CYSTOSCOPY WITH INSERTION OF UROLIFT N/A 05/20/2020   Procedure: CYSTOSCOPY WITH INSERTION OF UROLIFT;  Surgeon: Francisca Redell BROCKS, MD;  Location: [facility] ORS;  Service: Urology;   Laterality: N/A;   ESOPHAGOGASTRODUODENOSCOPY (EGD) WITH PROPOFOL  N/A 08/23/2016   Procedure: ESOPHAGOGASTRODUODENOSCOPY (EGD) WITH PROPOFOL ;  Surgeon: Gaylyn Gladis PENNER, MD;  Location: [facility] ENDOSCOPY;  Service: Endoscopy;  Laterality: N/A;   ESOPHAGOGASTRODUODENOSCOPY (EGD) WITH PROPOFOL  N/A 04/29/2020   Procedure: ESOPHAGOGASTRODUODENOSCOPY (EGD) WITH PROPOFOL ;  Surgeon: Maryruth Ole DASEN, MD;  Location: [facility] ENDOSCOPY;  Service: Endoscopy;  Laterality: N/A;   ESOPHAGOGASTRODUODENOSCOPY ENDOSCOPY  02/05/2008   EYE SURGERY     eye trauma  1970   piece of metal to eye removed in clinic   JOINT REPLACEMENT     KNEE ARTHROPLASTY Right 10/05/2020   Procedure: COMPUTER ASSISTED TOTAL KNEE ARTHROPLASTY;  Surgeon: Mardee Lynwood SQUIBB, MD;  Location: [facility] ORS;  Service: Orthopedics;  Laterality: Right;   KNEE ARTHROPLASTY Left 06/08/2022   Procedure: COMPUTER ASSISTED TOTAL KNEE ARTHROPLASTY;  Surgeon: Mardee Lynwood SQUIBB, MD;  Location: [facility] ORS;  Service: Orthopedics;  Laterality: Left;   KNEE ARTHROSCOPY Left 12/11/2021   Procedure: ARTHROSCOPY KNEE;  Surgeon: Mardee Lynwood SQUIBB, MD;  Location: [facility]  ORS;  Service: Orthopedics;  Laterality: Left;   KNEE ARTHROSCOPY W/ PARTIAL MEDIAL MENISCECTOMY Right 01/02/2011   LEFT HEART CATH AND CORONARY ANGIOGRAPHY Left 07/14/2007   Procedure: LEFT HEART CATH AND CORONARY ANGIOGRAPHY; Location: [facility]; Surgeon: Miquel Ellen, MD   LEFT HEART CATH AND CORONARY ANGIOGRAPHY N/A 07/26/2023   Procedure: LEFT HEART CATH AND CORONARY ANGIOGRAPHY;  Surgeon: Verlin Lonni BIRCH, MD;  [facility] INVASIVE CV LAB;  Service: Cardiovascular;  Laterality: N/A;   SEPTOPLASTY     TONSILLECTOMY  02/05/2008   ULTRASOUND GUIDANCE FOR VASCULAR ACCESS Left 03/03/2021   Procedure: ULTRASOUND GUIDANCE FOR VASCULAR ACCESS, LEFT FEMORAL ARTERY;  Surgeon: Sheree Penne Lonni, MD;  Location: [facility] OR;  Service: Vascular;  Laterality: Left;   WRIST FRACTURE SURGERY Right      Social  History:   reports that he quit smoking about 16 years ago. His smoking use included cigarettes. He started smoking about 49 years ago. He has a 32.5 pack-year smoking history. He has never used smokeless tobacco. He reports that he does not currently use alcohol . He reports that he does not use drugs.   Family History:  His family history includes Cancer in his father; Cardiomyopathy in his mother; Cataracts in his mother; Colon polyps in his mother; Esophageal cancer in his cousin; Glaucoma in his mother; Heart disease in his father and mother; Hypertension in his father; Irregular heart beat in his mother.   Allergies No Known Allergies   Home Medications  Prior to Admission medications   Medication Sig Start Date End Date Taking? Authorizing Provider  acetaminophen  (TYLENOL ) 500 MG tablet Take 2 tablets (1,000 mg total) by mouth every 6 (six) hours as needed for mild pain. 06/09/22  Yes Kip Lynwood Double, PA-C  amLODipine  (NORVASC ) 5 MG tablet Take 1 tablet (5 mg total) by mouth daily. 05/20/23 09/10/23 Yes Vannie Reche RAMAN, NP  amphetamine -dextroamphetamine (ADDERALL XR) 25 MG 24 hr capsule Take 25 mg by mouth daily as needed (focus).   Yes [provider]  aspirin  EC 81 MG tablet Take 81 mg by mouth daily. Swallow whole.   Yes [provider]  baclofen  (LIORESAL ) 20 MG tablet Take 30 mg by mouth 3 (three) times daily.   Yes [provider]  BREZTRI  AEROSPHERE 160-9-4.8 MCG/ACT AERO USE 2 INHALATIONS IN THE MORNING AND AT BEDTIME 04/30/23  Yes Hunsucker, Donnice SAUNDERS, MD  buPROPion  (WELLBUTRIN  XL) 150 MG 24 hr tablet Take 150 mg by mouth daily. 08/22/23  Yes [provider]  celecoxib  (CELEBREX ) 200 MG capsule Take 200 mg by mouth 2 (two) times daily. 02/26/21  Yes [provider]  gabapentin  (NEURONTIN ) 300 MG capsule Take 1-2 capsules (300-600 mg total) by mouth 2 (two) times daily. 600 mg in am and 300 mg in afternoon Patient taking differently: Take  600 mg by mouth See admin instructions. Take 600 by mouth in the morning and 600 in the late evening 06/09/22  Yes Kip Lynwood Double, PA-C  isosorbide  mononitrate (IMDUR ) 30 MG 24 hr tablet Take 1 tablet (30 mg total) by mouth daily. Patient taking differently: Take 15 mg by mouth daily. 06/14/23  Yes Vannie Reche RAMAN, NP  montelukast  (SINGULAIR ) 10 MG tablet TAKE 1 TABLET AT BEDTIME 02/04/23  Yes Hunsucker, Donnice SAUNDERS, MD  Multiple Vitamins-Minerals (MULTIVITAMIN WITH MINERALS) tablet Take 1 tablet by mouth daily.   Yes [provider]  Ofatumumab (KESIMPTA) 20 MG/0.4ML SOAJ Inject 20 mg into the skin every 30 (thirty) days. 1st day of  every month   Yes [provider]  pantoprazole  (PROTONIX ) 20 MG tablet Take 20 mg by mouth at bedtime. 09/11/21  Yes [provider]  Polyethyl Glycol-Propyl Glycol (SYSTANE) 0.4-0.3 % SOLN Place 1 drop into both eyes 4 (four) times daily as needed (dry eyes).   Yes [provider]  rosuvastatin  (CRESTOR ) 20 MG tablet TAKE 1 TABLET AT BEDTIME 01/24/23  Yes Lonni Slain, MD  tamsulosin  (FLOMAX ) 0.4 MG CAPS capsule TAKE 1 CAPSULE DAILY Patient taking differently: Take 0.4 mg by mouth at bedtime. 06/13/23  Yes Francisca Redell BROCKS, MD  traZODone  (DESYREL ) 100 MG tablet Take 100 mg by mouth at bedtime.   Yes [provider]  VENTOLIN  HFA 108 (90 Base) MCG/ACT inhaler Inhale 2 puffs into the lungs every 4 (four) hours as needed for wheezing or shortness of breath. 08/22/23  Yes Hunsucker, Donnice SAUNDERS, MD  Vibegron  (GEMTESA ) 75 MG TABS Take 1 tablet (75 mg total) by mouth daily. 04/11/23  Yes Francisca Redell BROCKS, MD  VITAMIN D  PO Take 1 capsule by mouth daily.   Yes [provider]  Spacer/Aero-Holding Chambers (AEROCHAMBER MV) inhaler Use as instructed 06/11/18   Brenna Adine CROME, DO     Critical care time: 34 min

## 2023-09-16 NOTE — Discharge Summary (Signed)
 Physician Discharge Summary  Patient ID: Allen Tapia MRN: [id number] DOB/AGE: 1955/11/24 68 y.o.  Admit date: 09/16/2023 Discharge date: 09/21/2023  Admission Diagnoses:  Multivessel coronary artery disease Dyslipidemia Prediabetes History of abdominal aortic aneurysm Degenerative joint disease Gastroesophageal reflux disease  Discharge Diagnoses:   Multivessel coronary artery disease Status post CABG x 2 Dyslipidemia Prediabetes History of abdominal aortic aneurysm Degenerative joint disease Gastroesophageal reflux disease  Discharged Condition: stable  Referring: Diedra Lame, MD Primary Care: Diedra Lame, MD Primary Cardiologist:Bridgette Lonni, MD  History of Present Illness:     Allen Tapia 68 y.o. male presents for surgical evaluation of LAD disease.  He has a strong family history of coronary disease.  He describes occasional atypical chest pain that he describes as a mild thumping that comes and goes without provocation.  He remains very active, and does not have any chest pressure, or shortness of breath.  Most of his mobility issues are secondary to his MS and knee surgeries. Mr. Krenz has complex LAD disease. Echocardiogram shows preserved biventricular function and no significant valvular disease. His case was discussed in Cath conference. Due to concerns for competitive flow if the diagonal vessel is stented, he would not be a candidate for a hybrid MIDCAB. We will proceed with an off-pump CABG 2.   Hospital Course: [name] was admitted for elective surgery on 09/16/2023.  He was taken to the operating room where two-vessel coronary bypass grafting was carried out.  The LAD was deep making off-pump bypass technically difficult.  Cardiopulmonary bypass was used for the procedure.  Following surgery, he separated from cardiopulmonary bypass without difficulty and was transferred to the surgical ICU in stable condition.  He remained  hemodynamically stable.  He was extubated about 5 PM on the day of surgery.  Blood pressure was supported with norepinephrine  early postoperatively.  Monitoring lines and chest tubes were removed on the first postoperative day and he weaned off the norepinephrine  quickly.  The monitoring lines were removed and he was  transferred to 4E Progressive Care.  He was mobilized routinely and progressed well. He developed paroxysmal atrial fibrillation in the early morning on post-op day 2.   He was started on oral amiodarone  loading.  The patient again suffered a brief episode of rapid paroxysmal atrial fibrillation.  He was treated with an IV Amiodarone  bolus with conversion to NSR.  Due to experiencing multiple episodes he was started on Eliquis for stroke prevention.  He continues to do well.  He has ambulated without difficulty.  His surgical incision sites are healing without evidence of infection.  He is medically stable for discharge home today.  Consults: pulmonary/intensive care  Significant Diagnostic Studies: angiography:     Prox RCA lesion is 20% stenosed.   Mid LAD lesion is 90% stenosed.   1st Diag lesion is 70% stenosed.   2nd Diag lesion is 70% stenosed.   Severe complex lesion involving the proximal to mid LAD and a large caliber Diagonal branch. The LAD lesion is calcified and severely stenosed. The Large Diagonal has ostial disease. The LAD beyond the Diagonal branch has a sharp turn and then an aneurysmal segment.  No disease in the Circumflex Mild non-obstructive disease in the RCA LVEDP=13 mmHg   Recommendations: Given the complex disease in the calcified mid LAD involving the Diagonal branch, I think the best option for revascularization is CABG. PCI would require orbital atherectomy of the LAD and given the large caliber Diagonal branch, stenting of the  LAD could compromise flow into the Diagonal branch. I do not think the vessels are favorable for bifurcation stenting. Continue ASA  and statin. Outpatient referral to CT surgery for CABG. Echo in the office before his surgical consult.   Treatments: surgery:   09/16/2023 Patient:  Allen Tapia Pre-Op Dx: LILLA CAD                           HTN                         HLP Post-op Dx:  same Procedure: CABG X 2.  LIMA LAD, RSVG diagonal   Endoscopic greater saphenous vein harvest on the right     Surgeon and Role:      * Lightfoot, Linnie KIDD, MD - Primary    * EMERSON Becket , PA-C - assisting An experienced assistant was required given the complexity of this surgery and the standard of surgical care. The assistant was needed for exposure, dissection, suctioning, retraction of delicate tissues and sutures, instrument exchange and for overall help during this procedure.    Discharge Exam: Blood pressure (!) 116/95, pulse 68, temperature 98 F (36.7 C), temperature source Oral, resp. rate 18, height 6' (1.829 m), weight 87 kg, SpO2 92%. General appearance: alert, cooperative, and no distress Neurologic: intact Heart: regular rate and rhythm, S1, S2 normal, no murmur, click, rub or gallop Lungs: clear to auscultation bilaterally Abdomen: soft, non-tender; bowel sounds normal; no masses,  no organomegaly Extremities: extremities normal, atraumatic, no cyanosis or edema Wound: Clean and dry without sign of infection  Disposition: Discharge disposition: 01-Home or Self Care       Discharge Instructions     Amb Referral to Cardiac Rehabilitation   Complete by: As directed    Diagnosis: CABG   CABG X ___: 2   After initial evaluation and assessments completed: Virtual Based Care may be provided alone or in conjunction with Phase 2 Cardiac Rehab based on patient barriers.: Yes   Intensive Cardiac Rehabilitation (ICR) [facility] location only OR Traditional Cardiac Rehabilitation (TCR) *If criteria for ICR are not met will enroll in TCR ([facility] only): Yes      Allergies as of 09/21/2023   No Known Allergies       Medication List     STOP taking these medications    amLODipine  5 MG tablet Commonly known as: NORVASC    celecoxib  200 MG capsule Commonly known as: CELEBREX    isosorbide  mononitrate 30 MG 24 hr tablet Commonly known as: IMDUR        TAKE these medications    acetaminophen  500 MG tablet Commonly known as: TYLENOL  Take 2 tablets (1,000 mg total) by mouth every 6 (six) hours as needed for mild pain.   AeroChamber MV inhaler Use as instructed   amiodarone  200 MG tablet Commonly known as: PACERONE  Take 2 tablets (400 mg total) by mouth 2 (two) times daily. For 10 days then take 1 tablet (200mg ) TWICE daily for 14 days then take 1 tablet (200mg ) ONCE daily thereafter.   amphetamine -dextroamphetamine 25 MG 24 hr capsule Commonly known as: ADDERALL XR Take 25 mg by mouth daily as needed (focus).   apixaban 5 MG Tabs tablet Commonly known as: ELIQUIS Take 1 tablet (5 mg total) by mouth 2 (two) times daily.   aspirin  EC 81 MG tablet Take 81 mg by mouth daily. Swallow whole.   baclofen  20 MG  tablet Commonly known as: LIORESAL  Take 30 mg by mouth 3 (three) times daily.   Breztri  Aerosphere 160-9-4.8 MCG/ACT Aero inhaler Generic drug: budesonide -glycopyrrolate -formoterol  USE 2 INHALATIONS IN THE MORNING AND AT BEDTIME   buPROPion  150 MG 24 hr tablet Commonly known as: WELLBUTRIN  XL Take 150 mg by mouth daily.   furosemide  40 MG tablet Commonly known as: LASIX  Take 1 tablet (40 mg total) by mouth daily. Take for 3 days then stop   gabapentin  300 MG capsule Commonly known as: NEURONTIN  Take 1-2 capsules (300-600 mg total) by mouth 2 (two) times daily. 600 mg in am and 300 mg in afternoon What changed:  how much to take when to take this additional instructions   Gemtesa  75 MG Tabs Generic drug: Vibegron  Take 1 tablet (75 mg total) by mouth daily.   Kesimpta 20 MG/0.4ML Soaj Generic drug: Ofatumumab Inject 20 mg into the skin every 30 (thirty) days. 1st day of  every month   metoprolol  tartrate 25 MG tablet Commonly known as: LOPRESSOR  Take 1 tablet (25 mg total) by mouth 2 (two) times daily.   montelukast  10 MG tablet Commonly known as: SINGULAIR  TAKE 1 TABLET AT BEDTIME   multivitamin with minerals tablet Take 1 tablet by mouth daily.   pantoprazole  20 MG tablet Commonly known as: PROTONIX  Take 20 mg by mouth at bedtime.   potassium chloride  SA 20 MEQ tablet Commonly known as: KLOR-CON  M Take 1 tablet (20 mEq total) by mouth daily. Take for 3 days then stop   rosuvastatin  20 MG tablet Commonly known as: CRESTOR  Take 2 tablets (40 mg total) by mouth at bedtime. What changed: how much to take   Systane 0.4-0.3 % Soln Generic drug: Polyethyl Glycol-Propyl Glycol Place 1 drop into both eyes 4 (four) times daily as needed (dry eyes).   tamsulosin  0.4 MG Caps capsule Commonly known as: FLOMAX  TAKE 1 CAPSULE DAILY What changed: when to take this   traMADol  50 MG tablet Commonly known as: ULTRAM  Take 1 tablet (50 mg total) by mouth every 6 (six) hours as needed for up to 7 days for moderate pain (pain score 4-6).   traZODone  100 MG tablet Commonly known as: DESYREL  Take 100 mg by mouth at bedtime.   Ventolin  HFA 108 (90 Base) MCG/ACT inhaler Generic drug: albuterol  Inhale 2 puffs into the lungs every 4 (four) hours as needed for wheezing or shortness of breath.   VITAMIN D  PO Take 1 capsule by mouth daily.        Follow-up Information     Shyrl Linnie KIDD, MD. Call on 09/24/2023.   Specialty: Cardiothoracic Surgery Why: This is a virtual (telephone call) follow up. Do not go to the  office. Dr. Shyrl will call you at around 2:30pm. Contact information: [address] [place] [address] [phone]         Percy Rosaline HERO, NP. Go on 10/07/2023.   Specialty: Cardiology Why: Your appointment is at 10:55am. Contact information: [address] [place] [address] [phone]          [facility]  Follow up.   Why: HH follow up- they will contact you Contact information: [address] [place] [address] [phone]         Diedra Lame, MD. Schedule an appointment as soon as possible for a visit.   Specialty: Family Medicine Why: For prediabetes surveillance Contact information: [address] [facility] [place]  [address] [phone]                 The patient has been discharged on:   1.Beta Blocker:  Yes [ x  ]                              No   [   ]                              If No, reason:  2.Ace Inhibitor/ARB: Yes [   ]                                     No  [ X   ]                                     If No, reason: titration of BB due to A. Fib, HR control  3.Statin:   Yes [ x  ]                  No  [   ]                  If No, reason:  4.Ecasa:  Yes  [ x  ]                  No   [   ]                  If No, reason:  5. ACS on Admission? No  P2Y12 Inhibitor:  Yes  [   ]                                No  [ X ]    Signed: Con GORMAN Bend, PA-C 09/21/2023, 10:11 AM

## 2023-09-16 NOTE — Brief Op Note (Signed)
 09/16/2023  11:21 AM  PATIENT:  Allen Tapia  68 y.o. male  PRE-OPERATIVE DIAGNOSIS:  CORONARY ARTERY DISEASE  POST-OPERATIVE DIAGNOSIS:  CORONARY ARTERY DISEASE  PROCEDURES:   CORONARY ARTERY BYPASS GRAFTING X 2, USING LEFT INTERNAL MAMMARY ARTERY AND ENDOSCOPIC HARVESTED RIGHT GREATER SAPHENOUS VEIN    Vein harvest time: Vein prep time:  ECHOCARDIOGRAM, TRANSESOPHAGEAL  SURGEON:  Shyrl Linnie KIDD, MD - Primary  PHYSICIAN ASSISTANT: Nylene Inlow  ASSISTANTS: Gretel Evalene BIRCH, RN, RN First Assistant               Pesare, Piyanuch, RN, Scrub Perso    ANESTHESIA:   general  EBL:   BLOOD ADMINISTERED:none  DRAINS: Mediastinal and left pleural drains  LOCAL MEDICATIONS USED:  NONE  COUNTS:  Correct  DICTATION: .Dragon Dictation  PLAN OF CARE: Admit to inpatient   PATIENT DISPOSITION:  ICU - intubated and critically ill.   Delay start of Pharmacological VTE agent (>24hrs) due to surgical blood loss or risk of bleeding: yes

## 2023-09-16 NOTE — Hospital Course (Addendum)
 Referring: Diedra Lame, MD Primary Care: Diedra Lame, MD Primary Cardiologist:Bridgette Lonni, MD  History of Present Illness:     Allen Tapia 68 y.o. male presents for surgical evaluation of LAD disease.  He has a strong family history of coronary disease.  He describes occasional atypical chest pain that he describes as a mild thumping that comes and goes without provocation.  He remains very active, and does not have any chest pressure, or shortness of breath.  Most of his mobility issues are secondary to his MS and knee surgeries. Allen Tapia has complex LAD disease. Echocardiogram shows preserved biventricular function and no significant valvular disease. His case was discussed in Cath conference. Due to concerns for competitive flow if the diagonal vessel is stented, he would not be a candidate for a hybrid MIDCAB. We will proceed with an off-pump CABG 2.   Hospital Course: Allen Tapia was admitted for elective surgery on 09/16/2023.  He was taken to the operating room where two-vessel coronary bypass grafting was carried out.  The LAD was deep making off-pump bypass technically difficult.  Cardiopulmonary bypass was used for the procedure.  Following surgery, he separated from cardiopulmonary bypass without difficulty and was transferred to the surgical ICU in stable condition.  He remained hemodynamically stable.  He was extubated about 5 PM on the day of surgery.  Blood pressure was supported with norepinephrine  early postoperatively.  Monitoring lines and chest tubes were removed on the first postoperative day and he weaned off the norepinephrine  quickly.  The monitoring lines were removed and he was  transferred to 4E Progressive Care.  He was mobilized routinely and progressed well. He developed paroxysmal atrial fibrillation in the early morning on post-op day 2.   He was started on oral amiodarone  loading.  The patient again suffered a brief episode of rapid paroxysmal atrial  fibrillation.  He was treated with an IV Amiodarone  bolus with conversion to NSR.  Due to experiencing multiple episodes he was started on Eliquis for stroke prevention.  He continues to do well.  He has ambulated without difficulty.  His surgical incision sites are healing without evidence of infection.  He is medically stable for discharge home today.

## 2023-09-16 NOTE — Transfer of Care (Signed)
 Immediate Anesthesia Transfer of Care Note  Patient: Allen Tapia  Procedure(s) Performed: CORONARY ARTERY BYPASS GRAFTING X 2, USING LEFT INTERNAL MAMMARY ARTERY AND ENDOSCOPIC HARVESTED RIGHT GREATER SAPHENOUS VEIN (Chest) ECHOCARDIOGRAM, TRANSESOPHAGEAL  Patient Location: ICU  Anesthesia Type:General  Level of Consciousness: Patient remains intubated per anesthesia plan  Airway & Oxygen Therapy: Patient remains intubated per anesthesia plan and Patient placed on Ventilator (see vital sign flow sheet for setting)  Post-op Assessment: Report given to RN and Post -op Vital signs reviewed and stable  Post vital signs: Reviewed and stable  Last Vitals:  Vitals Value Taken Time  BP 114/56 1256  Temp    Pulse 55 09/16/23 12:53  Resp 17 09/16/23 12:53  SpO2 99 % 09/16/23 12:53  Vitals shown include unfiled device data.  Last Pain:  Vitals:   09/16/23 0609  TempSrc:   PainSc: 0-No pain      Patients Stated Pain Goal: 0 (09/16/23 9390)  Complications: There were no known notable events for this encounter.

## 2023-09-16 NOTE — Discharge Instructions (Addendum)
Discharge Instructions:  1. You may shower, please wash incisions daily with soap and water and keep dry.  If you wish to cover wounds with dressing you may do so but please keep clean and change daily.  No tub baths or swimming until incisions have completely healed.  If your incisions become red or develop any drainage please call our office at [phone]  2. No Driving until cleared by Dr. Abran Duke office and you are no longer using narcotic pain medications  3. Monitor your weight daily.. Please use the same scale and weigh at same time... If you gain 5-10 lbs in 48 hours with associated lower extremity swelling, please contact our office at [phone]  4. Fever of 101.5 for at least 24 hours with no source, please contact our office at [phone]  5. Activity- up as tolerated, please walk at least 3 times per day.  Avoid strenuous activity, no lifting, pushing, or pulling with your arms over 8-10 lbs for a minimum of 6 weeks  6. If any questions or concerns arise, please do not hesitate to contact our office at [phone]  ------------------------------------------- Information on my medicine - ELIQUIS (apixaban)  Why was Eliquis prescribed for you? Eliquis was prescribed for you to reduce the risk of a blood clot forming that can cause a stroke if you have a medical condition called atrial fibrillation (a type of irregular heartbeat).  What do You need to know about Eliquis ? Take your Eliquis TWICE DAILY - one tablet in the morning and one tablet in the evening with or without food. If you have difficulty swallowing the tablet whole please discuss with your pharmacist how to take the medication safely.  Take Eliquis exactly as prescribed by your doctor and DO NOT stop taking Eliquis without talking to the doctor who prescribed the medication.  Stopping may increase your risk of developing a stroke.  Refill your prescription before you run out.  After discharge, you  should have regular check-up appointments with your healthcare provider that is prescribing your Eliquis.  In the future your dose may need to be changed if your kidney function or weight changes by a significant amount or as you get older.  What do you do if you miss a dose? If you miss a dose, take it as soon as you remember on the same day and resume taking twice daily.  Do not take more than one dose of ELIQUIS at the same time to make up a missed dose.  Important Safety Information A possible side effect of Eliquis is bleeding. You should call your healthcare provider right away if you experience any of the following: ? Bleeding from an injury or your nose that does not stop. ? Unusual colored urine (red or dark brown) or unusual colored stools (red or black). ? Unusual bruising for unknown reasons. ? A serious fall or if you hit your head (even if there is no bleeding).  Some medicines may interact with Eliquis and might increase your risk of bleeding or clotting while on Eliquis. To help avoid this, consult your healthcare provider or pharmacist prior to using any new prescription or non-prescription medications, including herbals, vitamins, non-steroidal anti-inflammatory drugs (NSAIDs) and supplements.  This website has more information on Eliquis (apixaban): http://www.eliquis.com/eliquis/home

## 2023-09-16 NOTE — Progress Notes (Signed)
 Extubated.  Now on [place] Phonation quality excellent Sats mid 90s no distress Plan Cont wean o2 Cont post op per cardiac surg.

## 2023-09-16 NOTE — Op Note (Signed)
 [address]       [name] [address]             [phone]                                          09/16/2023 Patient:  Allen Tapia Col[place] Pre-Op Dx: LILLA CAD     HTN   HLP Post-op Dx:  same Procedure: CABG X 2.  LIMA LAD, RSVG diagonal   Endoscopic greater saphenous vein harvest on the right   Surgeon and Role:      * Johniece Hornbaker, Linnie KIDD, MD - Primary    * EMERSON Becket , PA-C - assisting An experienced assistant was required given the complexity of this surgery and the standard of surgical care. The assistant was needed for exposure, dissection, suctioning, retraction of delicate tissues and sutures, instrument exchange and for overall help during this procedure.    Anesthesia  general EBL:  500ml Blood Administration: none Xclamp Time:  18 min Pump Time:   Drains: 64 F blake drain: L, mediastinal  Wires: ventricular Counts: correct   Indications: 68yo male with complex LAD disease. Echocardiogram shows preserved biventricular function and no significant valvular disease. His case was discussed in Cath conference. Due to concerns for competitive flow if the diagonal vessel is stented, he would not be a candidate for a hybrid MIDCAB. We will proceed with an off-pump CABG 2   Findings: Diagonal anastomosis was completed off-pump.  The LAD was deeply intramyocardial, so I elected to perform this on-pump and cross clamped.  Good vein and LIMA.  Operative Technique: All invasive lines were placed in pre-op holding.  After the risks, benefits and alternatives were thoroughly discussed, the patient was brought to the operative theatre.  Anesthesia was induced, and the patient was prepped and draped in normal sterile fashion.  An appropriate surgical pause was performed, and pre-operative antibiotics were dosed accordingly.  We began with simultaneous incisions along the right leg for harvesting of the greater saphenous vein and the chest for the  sternotomy.  In regards to the sternotomy, this was carried down with bovie cautery, and the sternum was divided with a reciprocating saw.  Meticulous hemostasis was obtained.  The left internal thoracic artery was exposed and harvested in in pedicled fashion.  The patient was systemically heparinized, and the artery was divided distally, and placed in a papaverine sponge.    The sternal elevator was removed, and a retractor was placed.  The pericardium was divided in the midline and fashioned into a cradle with pericardial stitches.   After we confirmed an appropriate ACT, the stabilizer was placed and we isolated a good target on the diagonal.  An arteriotomy was created.  The vein was anastomosed in an end to side fashion.  We then moved to the LAD, which was deeply intramyocardial.  Since I was not confident that we could do this off-pump, we proceeded to cannulate after confirming an appropriate ACT.  The ascending aorta was cannulated in standard fashion.  The right atrial appendage was used for venous cannulation site.  Cardiopulmonary bypass was initiated, and the heart retractor was placed. The cross clamp was applied, and a dose of anterograde cardioplegia was given with good arrest of the heart.  Finally, we exposed a good target on the LAD, and fashioned an end  to side anastomosis between it and the LITA.  We began to re-warm, and a re-animation dose of cardioplegia was given.  The heart was de-aired, and the cross clamp was removed.  Meticulous hemostasis was obtained.    A partial occludding clamp was then placed on the ascending aorta, and we created an end to side anastomosis between it and the proximal vein graft.  Rings were placed on the proximal anastomosis.  Hemostasis was obtained, and we separated from cardiopulmonary bypass without event.  The heparin  was reversed with protamine .  Chest tubes and wires were placed, and the sternum was re-approximated with sternal wires.  The soft tissue  and skin were re-approximated wth absorbable suture.    The patient tolerated the procedure without any immediate complications, and was transferred to the ICU in guarded condition.  Marsheila Alejo MALVA Rayas

## 2023-09-17 ENCOUNTER — Encounter (HOSPITAL_COMMUNITY): Payer: Self-pay | Admitting: Thoracic Surgery (Cardiothoracic Vascular Surgery)

## 2023-09-17 ENCOUNTER — Inpatient Hospital Stay (HOSPITAL_COMMUNITY)

## 2023-09-17 DIAGNOSIS — Z951 Presence of aortocoronary bypass graft: Secondary | ICD-10-CM | POA: Diagnosis not present

## 2023-09-17 LAB — BASIC METABOLIC PANEL WITH GFR
Anion gap: 4 — ABNORMAL LOW (ref 5–15)
Anion gap: 8 (ref 5–15)
BUN: 10 mg/dL (ref 8–23)
BUN: 8 mg/dL (ref 8–23)
CO2: 24 mmol/L (ref 22–32)
CO2: 25 mmol/L (ref 22–32)
Calcium: 7.5 mg/dL — ABNORMAL LOW (ref 8.9–10.3)
Calcium: 7.8 mg/dL — ABNORMAL LOW (ref 8.9–10.3)
Chloride: 106 mmol/L (ref 98–111)
Chloride: 102 mmol/L (ref 98–111)
Creatinine, Ser: 0.78 mg/dL (ref 0.61–1.24)
Creatinine, Ser: 0.72 mg/dL (ref 0.61–1.24)
GFR, Estimated: >60 mL/min (ref >60)
GFR, Estimated: >60 mL/min (ref >60)
Glucose, Bld: 117 mg/dL — ABNORMAL HIGH (ref 70–99)
Glucose, Bld: 93 mg/dL (ref 70–99)
Potassium: 4 mmol/L (ref 3.5–5.1)
Potassium: 4 mmol/L (ref 3.5–5.1)
Sodium: 134 mmol/L — ABNORMAL LOW (ref 135–145)
Sodium: 135 mmol/L (ref 135–145)

## 2023-09-17 LAB — CBC
HCT: 34 % — ABNORMAL LOW (ref 39.0–52.0)
HCT: 35.7 % — ABNORMAL LOW (ref 39.0–52.0)
Hemoglobin: 11 g/dL — ABNORMAL LOW (ref 13.0–17.0)
Hemoglobin: 11.6 g/dL — ABNORMAL LOW (ref 13.0–17.0)
MCH: 31 pg (ref 26.0–34.0)
MCH: 31.5 pg (ref 26.0–34.0)
MCHC: 32.4 g/dL (ref 30.0–36.0)
MCHC: 32.5 g/dL (ref 30.0–36.0)
MCV: 95.8 fL (ref 80.0–100.0)
MCV: 97 fL (ref 80.0–100.0)
Platelets: 172 10*3/uL (ref 150–400)
Platelets: 166 10*3/uL (ref 150–400)
RBC: 3.55 MIL/uL — ABNORMAL LOW (ref 4.22–5.81)
RBC: 3.68 MIL/uL — ABNORMAL LOW (ref 4.22–5.81)
RDW: 13.7 % (ref 11.5–15.5)
RDW: 13.8 % (ref 11.5–15.5)
WBC: 12.1 10*3/uL — ABNORMAL HIGH (ref 4.0–10.5)
WBC: 11.8 10*3/uL — ABNORMAL HIGH (ref 4.0–10.5)
nRBC: 0 % (ref 0.0–0.2)
nRBC: 0 % (ref 0.0–0.2)

## 2023-09-17 LAB — LIPID PANEL
Cholesterol: 56 mg/dL (ref 0–200)
HDL: 29 mg/dL — ABNORMAL LOW (ref >40)
LDL Cholesterol: 19 mg/dL (ref 0–99)
Total CHOL/HDL Ratio: 1.9 ratio
Triglycerides: 40 mg/dL (ref <150)
VLDL: 8 mg/dL (ref 0–40)

## 2023-09-17 LAB — MAGNESIUM
Magnesium: 2.3 mg/dL (ref 1.7–2.4)
Magnesium: 2.3 mg/dL (ref 1.7–2.4)

## 2023-09-17 LAB — GLUCOSE, CAPILLARY
Glucose-Capillary: 101 mg/dL — ABNORMAL HIGH (ref 70–99)
Glucose-Capillary: 107 mg/dL — ABNORMAL HIGH (ref 70–99)
Glucose-Capillary: 110 mg/dL — ABNORMAL HIGH (ref 70–99)
Glucose-Capillary: 111 mg/dL — ABNORMAL HIGH (ref 70–99)
Glucose-Capillary: 114 mg/dL — ABNORMAL HIGH (ref 70–99)
Glucose-Capillary: 123 mg/dL — ABNORMAL HIGH (ref 70–99)
Glucose-Capillary: 128 mg/dL — ABNORMAL HIGH (ref 70–99)
Glucose-Capillary: 73 mg/dL (ref 70–99)
Glucose-Capillary: 74 mg/dL (ref 70–99)
Glucose-Capillary: 81 mg/dL (ref 70–99)
Glucose-Capillary: 88 mg/dL (ref 70–99)

## 2023-09-17 LAB — HEMOGLOBIN A1C
Hgb A1c MFr Bld: 5.9 % — ABNORMAL HIGH (ref 4.8–5.6)
Mean Plasma Glucose: 122.63 mg/dL

## 2023-09-17 MED ORDER — INSULIN ASPART 100 UNIT/ML IJ SOLN
2.0000 [IU] | INTRAMUSCULAR | Status: DC
  Administered 2023-09-18: 4 [IU] via SUBCUTANEOUS
  Administered 2023-09-19: 2 [IU] via SUBCUTANEOUS

## 2023-09-17 MED ORDER — ENOXAPARIN SODIUM 40 MG/0.4ML IJ SOSY: 40.0000 mg | PREFILLED_SYRINGE | Freq: Every day | INTRAMUSCULAR | Status: DC

## 2023-09-17 MED ORDER — ORAL CARE MOUTH RINSE: 15.0000 mL | OROMUCOSAL | Status: DC | PRN

## 2023-09-17 MED ORDER — ENOXAPARIN SODIUM 40 MG/0.4ML IJ SOSY
40.0000 mg | PREFILLED_SYRINGE | Freq: Every day | INTRAMUSCULAR | Status: DC
  Administered 2023-09-17 – 2023-09-19 (×3): 40 mg via SUBCUTANEOUS
  Filled 2023-09-17 (×3): qty 0.4

## 2023-09-17 NOTE — Progress Notes (Signed)
 T[facility] DAILY ICU PROGRESS NOTE                   [address]            [name] [address]          [phone]   1 Day Post-Op Procedure(s) (LRB): CORONARY ARTERY BYPASS GRAFTING X 2, USING LEFT INTERNAL MAMMARY ARTERY AND ENDOSCOPIC HARVESTED RIGHT GREATER SAPHENOUS VEIN (N/A) ECHOCARDIOGRAM, TRANSESOPHAGEAL (N/A)  Total Length of Stay:  LOS: 1 day   Subjective: Extubated around 4 PM yesterday. Awake and alert, pain controlled.  Objective: Vital signs in last 24 hours: Temp:  [97.2 F (36.2 C)-99.7 F (37.6 C)] 99.3 F (37.4 C) (06/24 0600) Pulse Rate:  [54-86] 61 (06/24 0700) Resp:  [8-28] 17 (06/24 0700) BP: (85-136)/(52-123) 101/62 (06/24 0700) SpO2:  [92 %-100 %] 99 % (06/24 0700) Arterial Line BP: (69-305)/(-19-252) 119/10 (06/24 0700) FiO2 (%):  [50 %] 50 % (06/23 1500) Weight:  [94.5 kg] 94.5 kg (06/24 0600)  Filed Weights   09/16/23 0546 09/17/23 0600  Weight: 89.8 kg 94.5 kg    Weight change: 4.717 kg   Hemodynamic parameters for last 24 hours: CVP:  [0 mmHg-61 mmHg] 3 mmHg CO:  [3.6 L/min-9.1 L/min] 5.1 L/min CI:  [1.7 L/min/m2-4.3 L/min/m2] 2.4 L/min/m2  Intake/Output from previous day: 06/23 0701 - 06/24 0700 In: 5183.3 [I.V.:2038; Blood:705; IV Piggyback:2440.2] Out: 5274 [Urine:3675; Blood:1209; Chest Tube:390]  Intake/Output this shift: No intake/output data recorded.  Current Meds: Scheduled Meds:  acetaminophen   1,000 mg Oral Q6H   Or   acetaminophen  (TYLENOL ) oral liquid 160 mg/5 mL  1,000 mg Per Tube Q6H   aspirin  EC  325 mg Oral Daily   Or   aspirin   324 mg Per Tube Daily   bisacodyl   10 mg Oral Daily   Or   bisacodyl   10 mg Rectal Daily   budesonide -glycopyrrolate -formoterol   2 puff Inhalation BID   buPROPion   150 mg Oral Daily   Chlorhexidine  Gluconate Cloth  6 each Topical Daily   docusate sodium   200 mg Oral Daily   gabapentin   600 mg Oral BID   insulin  aspart  2-6 Units Subcutaneous Q4H   metoCLOPramide   (REGLAN ) injection  10 mg Intravenous Q6H   metoprolol  tartrate  12.5 mg Oral BID   Or   metoprolol  tartrate  12.5 mg Per Tube BID   mirabegron  ER  25 mg Oral Daily   montelukast   10 mg Oral QHS   [START ON 09/18/2023] pantoprazole   40 mg Oral Daily   pantoprazole  (PROTONIX ) IV  40 mg Intravenous QHS   rosuvastatin   40 mg Oral QHS   sodium chloride  flush  3 mL Intravenous Q12H   sodium chloride  flush  3-10 mL Intravenous Q12H   sodium chloride  flush  3-10 mL Intravenous Q12H   sodium chloride  flush  3-10 mL Intravenous Q12H   tamsulosin   0.4 mg Oral QPC supper   traZODone   100 mg Oral QHS   Continuous Infusions:  sodium chloride      albumin human 999 mL/hr at 09/17/23 0700    ceFAZolin  (ANCEF ) IV Stopped (09/17/23 0557)   dexmedetomidine  (PRECEDEX ) IV infusion Stopped (09/16/23 1459)   niCARDipine     norepinephrine  (LEVOPHED ) Adult infusion 4 mcg/min (09/17/23 0700)   PRN Meds:.albumin human, metoprolol  tartrate, midazolam , morphine  injection, ondansetron  (ZOFRAN ) IV, mouth rinse, oxyCODONE , sodium chloride  flush, sodium chloride  flush, sodium chloride  flush, sodium chloride  flush, traMADol   General appearance: alert, cooperative,  and no distress Neurologic: intact Heart: Regular rate and rhythm, monitor showing normal sinus rhythm with no significant arrhythmias. Lungs: Normal work of breathing on 2 L nasal cannula O2.  Breath sounds are clear to auscultation anteriorly.  Chest x-ray with no unexpected features.  Lung fields well-expanded. Abdomen: Active bowel sounds, soft and without tenderness Extremities: All warm and well-perfused.  No edema.) is covered with a dry dressing Wound: Sternotomy incision is covered with a dry Aquacel dressing.  Lab Results: CBC: Recent Labs    09/16/23 1831 09/17/23 0353  WBC 13.3* 12.1*  HGB 11.3* 11.0*  HCT 34.4* 34.0*  PLT 151 172   BMET:  Recent Labs    09/16/23 1831 09/17/23 0353  NA 138 134*  K 4.4 4.0  CL 108 106  CO2 23  24  GLUCOSE 138* 117*  BUN 11 10  CREATININE 0.78 0.78  CALCIUM  7.8* 7.5*    CMET: Lab Results  Component Value Date   WBC 12.1 (H) 09/17/2023   HGB 11.0 (L) 09/17/2023   HCT 34.0 (L) 09/17/2023   PLT 172 09/17/2023   GLUCOSE 117 (H) 09/17/2023   CHOL 56 09/17/2023   TRIG 40 09/17/2023   HDL 29 (L) 09/17/2023   LDLCALC 19 09/17/2023   ALT 29 09/12/2023   AST 30 09/12/2023   NA 134 (L) 09/17/2023   K 4.0 09/17/2023   CL 106 09/17/2023   CREATININE 0.78 09/17/2023   BUN 10 09/17/2023   CO2 24 09/17/2023   INR 1.3 (H) 09/16/2023   HGBA1C 5.9 (H) 09/17/2023      PT/INR:  Recent Labs    09/16/23 1258  LABPROT 16.2*  INR 1.3*   Radiology: [facility] Chest Port 1 View Result Date: 09/16/2023 CLINICAL DATA:  [id number] S/P CABG x 2 [id number] EXAM: PORTABLE CHEST - 1 VIEW COMPARISON:  09/12/2023 FINDINGS: Interval median sternotomy. CABG marker. Endotracheal tube tip 5 cm above carina. Right IJ central line to the distal SVC. Mediastinal drain in place. Relatively low lung volumes with some patchy atelectasis in the lung bases left greater than right. Heart size and mediastinal contours are within normal limits. Blunting of lateral costophrenic angles. IMPRESSION: 1. Interval median sternotomy with support devices as above. 2. Low lung volumes with bibasilar atelectasis and small effusions. Electronically Signed   By: JONETTA Faes M.D.   On: 09/16/2023 15:01   ECHO INTRAOPERATIVE TEE Result Date: 09/16/2023  *INTRAOPERATIVE TRANSESOPHAGEAL REPORT *  Patient Name:   Allen Tapia Date of Exam: 09/16/2023 Medical Rec #:  [id number]        Height:       72.0 in Accession #:    [id number]       Weight:       198.0 lb Date of Birth:  07/25/1955         BSA:          2.12 m Patient Age:    67 years         BP:           107/69 mmHg Patient Gender: M                HR:           48 bpm. Exam Location:  Anesthesiology Transesophogeal exam was perform intraoperatively during surgical procedure. Patient was  closely monitored under general anesthesia during the entirety of examination. Indications:     CABG Performing Phys: Bernardino Mango, MD Complications: No known complications during this  procedure. POST-OP IMPRESSIONS Overall, there were no significant changes from pre-bypass. _ Left Ventricle: The left ventricle is unchanged from pre-bypass. _ Right Ventricle: The right ventricle appears unchanged from pre-bypass. _ Aorta: The aorta appears unchanged from pre-bypass. _ Left Atrium: The left atrium appears unchanged from pre-bypass. _ Left Atrial Appendage: The left atrial appendage appears unchanged from pre-bypass. _ Aortic Valve: The aortic valve appears unchanged from pre-bypass. _ Mitral Valve: There is trivial regurgitation. _ Tricuspid Valve: There is trivial regurgitation. _ Pulmonic Valve: The pulmonic valve appears unchanged from pre-bypass. _ Interatrial Septum: The interatrial septum appears unchanged from pre-bypass. _ Interventricular Septum: The interventricular septum appears unchanged from pre-bypass. _ Pericardium: The pericardium appears unchanged from pre-bypass. PRE-OP FINDINGS  Left Ventricle: The left ventricle has normal systolic function, with an ejection fraction of 55-60%. The cavity size was normal. There is no increase in left ventricular wall thickness. Right Ventricle: The right ventricle has normal systolic function. The cavity was normal. There is no increase in right ventricular wall thickness. Left Atrium: Left atrial size was normal in size. No left atrial/left atrial appendage thrombus was detected. Right Atrium: Right atrial size was normal in size. Interatrial Septum: No atrial level shunt detected by color flow Doppler. Pericardium: There is no evidence of pericardial effusion. Mitral Valve: The mitral valve is normal in structure. Mitral valve regurgitation is not visualized by color flow Doppler. Tricuspid Valve: The tricuspid valve was normal in structure. Tricuspid valve  regurgitation was not visualized by color flow Doppler. Aortic Valve: The aortic valve is normal in structure. Aortic valve regurgitation is trivial by color flow Doppler. There is no stenosis of the aortic valve. Pulmonic Valve: The pulmonic valve was normal in structure. Pulmonic valve regurgitation is not visualized by color flow Doppler. Aorta: The ascending aorta and aortic arch are normal in size and structure. There is mild dilatation of the aortic root, measuring 42 mm. +--------------+--------++ LEFT VENTRICLE         +--------------+--------++ PLAX 2D                +--------------+--------++ LVIDd:        4.80 cm  +--------------+--------++ LVIDs:        3.30 cm  +--------------+--------++ LVOT diam:    2.50 cm  +--------------+--------++ LV SV:        63 ml    +--------------+--------++ LV SV Index:  29.50    +--------------+--------++ LVOT Area:    4.91 cm +--------------+--------++                        +--------------+--------++ +-------------+-----------++ AORTIC VALVE             +-------------+-----------++ AV Vmax:     90.80 cm/s  +-------------+-----------++ AV Vmean:    64.600 cm/s +-------------+-----------++ AV VTI:      0.206 m     +-------------+-----------++ AV Peak Grad:3.3 mmHg    +-------------+-----------++ AV Mean Grad:2.0 mmHg    +-------------+-----------++  +-------------+-------++ AORTA                +-------------+-------++ Ao Root diam:4.20 cm +-------------+-------++  +--------------+-------+ SHUNTS                +--------------+-------+ Systemic Diam:2.50 cm +--------------+-------+  Bernardino Mango MD Electronically signed by Bernardino Mango MD Signature Date/Time: 09/16/2023/1:31:38 PM    Final      Assessment/Plan: S/P Procedure(s) (LRB): CORONARY ARTERY BYPASS GRAFTING X 2, USING LEFT INTERNAL MAMMARY ARTERY AND ENDOSCOPIC  HARVESTED RIGHT GREATER SAPHENOUS VEIN (N/A) ECHOCARDIOGRAM,  TRANSESOPHAGEAL (N/A)  - Postop day 1 CABG x 2.  For complex proximal LAD/diagonal disease preserved biventricular function.  Stable hemodynamics and cardiac rhythm since surgery.  Blood pressure is currently supported with norepinephrine .  Will plan to remove the pacer wires today, mobilize, wean nor epi as tolerated.  -Neuro: Grossly intact, pain control satisfactory.  -Pulm: Stable respiratory status since surgery.  Chest x-ray okay.  Work on pulmonary hygiene and wean supplemental oxygen.  -Renal: Normal function at baseline.  Weight is about 5 kg above preop if accurate.  Hold off on diuresis until norepi is off.  - GI: Abdominal exam is benign.  Active bowel sounds, okay to advance diet slowly as tolerated.  - Endo: No history of diabetes.  CBG's well-controlled,  transition off Endo tool and continue to monitor.  - DVT prophylaxis: mobilize, enoxaparin .    Divine Hansley G. Darnetta Kesselman, PA-C 09/17/2023 7:46 AM

## 2023-09-17 NOTE — Plan of Care (Signed)

## 2023-09-17 NOTE — Progress Notes (Signed)
 Patient ID: Allen Tapia, male   DOB: [birth year], 68 y.o.   MRN: [id number]  TCTS Evening Rounds:  Hemodynamically stable off NE.  Diuresing well.  CT output low.  BMET    Component Value Date/Time   NA 135 09/17/2023 1652   NA 143 07/24/2023 1329   K 4.0 09/17/2023 1652   CL 102 09/17/2023 1652   CO2 25 09/17/2023 1652   GLUCOSE 93 09/17/2023 1652   BUN 8 09/17/2023 1652   BUN 14 07/24/2023 1329   CREATININE 0.72 09/17/2023 1652   CALCIUM  7.8 (L) 09/17/2023 1652   EGFR 94 07/24/2023 1329   GFRNONAA >60 09/17/2023 1652   CBC    Component Value Date/Time   WBC 11.8 (H) 09/17/2023 1652   RBC 3.68 (L) 09/17/2023 1652   HGB 11.6 (L) 09/17/2023 1652   HGB 14.1 07/24/2023 1329   HCT 35.7 (L) 09/17/2023 1652   HCT 41.2 07/24/2023 1329   PLT 166 09/17/2023 1652   PLT 217 07/24/2023 1329   MCV 97.0 09/17/2023 1652   MCV 92 07/24/2023 1329   MCH 31.5 09/17/2023 1652   MCHC 32.5 09/17/2023 1652   RDW 13.8 09/17/2023 1652   RDW 12.8 07/24/2023 1329   LYMPHSABS 0.7 08/13/2022 2337   MONOABS 1.3 (H) 08/13/2022 2337   EOSABS 0.1 08/13/2022 2337   BASOSABS 0.1 08/13/2022 2337

## 2023-09-17 NOTE — Plan of Care (Signed)
   Problem: Health Behavior/Discharge Planning: Goal: Ability to manage health-related needs will improve Outcome: Progressing   Problem: Activity: Goal: Risk for activity intolerance will decrease Outcome: Progressing   Problem: Nutrition: Goal: Adequate nutrition will be maintained Outcome: Progressing   Problem: Coping: Goal: Level of anxiety will decrease Outcome: Progressing

## 2023-09-17 NOTE — Progress Notes (Signed)
 NAME:  Allen Tapia, MRN:  [id number], DOB:  1955/11/26, LOS: 1 ADMISSION DATE:  09/16/2023, CONSULTATION DATE:  6/23  REFERRING MD:  lightfoot, CHIEF COMPLAINT:  post-op icu care    History of Present Illness:  68 year old male prior smoker referred to cardiac surg for complex LAD disease. Had left heart cath 5/2 showed 90% lesion of LAD, 70% lesion in diag, 20% RCA. To [facility] 6/23 for planned CABG X2 (harvesting the left IM artery and Right SV)  Bypass time ~ 1 hr  EBL 1209 Cell saver 705 1700 crystalloid  500 albumin Presented to the ICU post op.  Initial gtts on arrival epi 1 mcg/min, Neo 10 mcg/min, dex 0.7mcg/min norepi 1 mcg/min  Initial CI 1.7 MAP > 65   Pertinent  Medical History  Multiple sclerosis: [place]  x 5 yrs (since Nov 2013) for MS , Angina, CAD involving LAD, arthritis, asthma, barretts esophagus, bilateral cataracts BPH Prior abd aortic endovascular tent,  Significant Hospital Events: Including procedures, antibiotic start and stop dates in addition to other pertinent events   2V CABG 6/23  Interim History / Subjective:  Sedated  Just getting settled in   Objective    Blood pressure 96/71, pulse 62, temperature 97.7 F (36.5 C), temperature source Oral, resp. rate 16, height 6' (1.829 m), weight 94.5 kg, SpO2 96%. CVP:  [0 mmHg-61 mmHg] 1 mmHg CO:  [3.6 L/min-9.1 L/min] 5.5 L/min CI:  [1.7 L/min/m2-4.3 L/min/m2] 2.6 L/min/m2  Vent Mode: PSV;CPAP FiO2 (%):  [50 %] 50 % Set Rate:  [12 bmp-16 bmp] 12 bmp Vt Set:  [620 mL] 620 mL PEEP:  [5 cmH20] 5 cmH20 Pressure Support:  [8 cmH20-10 cmH20] 8 cmH20 Plateau Pressure:  [15 cmH20] 15 cmH20   Intake/Output Summary (Last 24 hours) at 09/17/2023 1048 Last data filed at 09/17/2023 1000 Gross per 24 hour  Intake 2783.26 ml  Output 5714 ml  Net -2930.74 ml   Filed Weights   09/16/23 0546 09/17/23 0600  Weight: 89.8 kg 94.5 kg    Examination: General Resting in bed no acute distress HEENT normocephalic  atraumatic right IJ triple-lumen catheter unremarkable Pulmonary clear to auscultation diminished bases Cardiac regular rate and rhythm soft systolic heart murmur sternal dressing clean dry intact mediastinal tubes with bloody output totaling 390 mL since admission to the intensive care Abdomen soft nontender Extremities little cool, pulses are palpable brisk cap refill Neuro awake and oriented  Resolved problem list  Postoperative ventilator management extubated successfully on 6/23 postop Assessment and Plan  CAD s/p 2 V CABG (lightfoot 6/23) Still on low-dose norepinephrine  cardiac index looks good, I see no evidence of endorgan dysfunction Plan Continuing to wean norepinephrine  for systolic blood pressure greater than 90 Continue to monitor hemodynamics Chest tube management per cardiac surgery Removal of pacer wires today Mobilize Glycemic control Spirometry Continue aspirin  and statin Can probably move arterial line later today Holding Lopressor  and home calcium  channel blocker given norepinephrine  needs Multimodal pain control Complete prophylactic antibiotics  H/o asthma  Plan On Breztri  160-994.8 2 puffs bid substitute w/ formulary equivalent then resume home rx at dc   Expected post op anemia Hemoglobin stable  plan  Trend Transfuse for hgb < 8  H/o BPH Plan  Flomax  Try to get Foley out later today  H/o MS, history of depression Plan  Continue his baclofen , and Neurontin  Continue Wellbutrin   H/o adhd Plan Resume his adderal XR when able   Best Practice (right click and Reselect all  SmartList Selections daily)   Diet/type: NPO DVT prophylaxis other SCD  Pressure ulcer(s): N/A GI prophylaxis: PPI Lines: Central line Foley:  Yes, and it is still needed Code Status:  full code Last date of multidisciplinary goals of care discussion [per primary ]   Critical care time: NA

## 2023-09-18 ENCOUNTER — Inpatient Hospital Stay (HOSPITAL_COMMUNITY)

## 2023-09-18 LAB — BASIC METABOLIC PANEL WITH GFR
Anion gap: 7 (ref 5–15)
BUN: 9 mg/dL (ref 8–23)
CO2: 24 mmol/L (ref 22–32)
Calcium: 7.6 mg/dL — ABNORMAL LOW (ref 8.9–10.3)
Chloride: 102 mmol/L (ref 98–111)
Creatinine, Ser: 0.77 mg/dL (ref 0.61–1.24)
GFR, Estimated: >60 mL/min (ref >60)
Glucose, Bld: 68 mg/dL — ABNORMAL LOW (ref 70–99)
Potassium: 3.7 mmol/L (ref 3.5–5.1)
Sodium: 133 mmol/L — ABNORMAL LOW (ref 135–145)

## 2023-09-18 LAB — CBC
HCT: 35 % — ABNORMAL LOW (ref 39.0–52.0)
Hemoglobin: 11.2 g/dL — ABNORMAL LOW (ref 13.0–17.0)
MCH: 31.1 pg (ref 26.0–34.0)
MCHC: 32 g/dL (ref 30.0–36.0)
MCV: 97.2 fL (ref 80.0–100.0)
Platelets: 155 10*3/uL (ref 150–400)
RBC: 3.6 MIL/uL — ABNORMAL LOW (ref 4.22–5.81)
RDW: 13.9 % (ref 11.5–15.5)
WBC: 12.7 10*3/uL — ABNORMAL HIGH (ref 4.0–10.5)
nRBC: 0 % (ref 0.0–0.2)

## 2023-09-18 LAB — GLUCOSE, CAPILLARY
Glucose-Capillary: 67 mg/dL — ABNORMAL LOW (ref 70–99)
Glucose-Capillary: 78 mg/dL (ref 70–99)
Glucose-Capillary: 127 mg/dL — ABNORMAL HIGH (ref 70–99)
Glucose-Capillary: 166 mg/dL — ABNORMAL HIGH (ref 70–99)
Glucose-Capillary: 118 mg/dL — ABNORMAL HIGH (ref 70–99)

## 2023-09-18 LAB — MAGNESIUM: Magnesium: 2.1 mg/dL (ref 1.7–2.4)

## 2023-09-18 MED ORDER — SODIUM CHLORIDE 0.9% FLUSH
3.0000 mL | INTRAVENOUS | Status: DC | PRN
  Administered 2023-09-20: 3 mL via INTRAVENOUS

## 2023-09-18 MED ORDER — POTASSIUM CHLORIDE CRYS ER 20 MEQ PO TBCR
20.0000 meq | EXTENDED_RELEASE_TABLET | ORAL | Status: AC
  Administered 2023-09-18 (×3): 20 meq via ORAL
  Filled 2023-09-18 (×3): qty 1

## 2023-09-18 MED ORDER — FUROSEMIDE 40 MG PO TABS
40.0000 mg | ORAL_TABLET | Freq: Every day | ORAL | Status: DC
  Administered 2023-09-18 – 2023-09-20 (×3): 40 mg via ORAL
  Filled 2023-09-18 (×3): qty 1

## 2023-09-18 MED ORDER — GABAPENTIN 300 MG PO CAPS
600.0000 mg | ORAL_CAPSULE | Freq: Every day | ORAL | Status: DC
  Administered 2023-09-19 – 2023-09-21 (×3): 600 mg via ORAL
  Filled 2023-09-18 (×3): qty 2

## 2023-09-18 MED ORDER — METOPROLOL TARTRATE 25 MG PO TABS
25.0000 mg | ORAL_TABLET | Freq: Two times a day (BID) | ORAL | Status: DC
  Administered 2023-09-18 – 2023-09-21 (×7): 25 mg via ORAL
  Filled 2023-09-18 (×8): qty 1

## 2023-09-18 MED ORDER — METOCLOPRAMIDE HCL 5 MG/ML IJ SOLN
10.0000 mg | Freq: Four times a day (QID) | INTRAMUSCULAR | Status: AC
  Administered 2023-09-18 – 2023-09-19 (×4): 10 mg via INTRAVENOUS
  Filled 2023-09-18 (×4): qty 2

## 2023-09-18 MED ORDER — SODIUM CHLORIDE 0.9 % IV SOLN: 250.0000 mL | INTRAVENOUS | Status: AC | PRN

## 2023-09-18 MED ORDER — SODIUM CHLORIDE 0.9% FLUSH
3.0000 mL | Freq: Two times a day (BID) | INTRAVENOUS | Status: DC
  Administered 2023-09-18 – 2023-09-21 (×4): 3 mL via INTRAVENOUS

## 2023-09-18 MED ORDER — GABAPENTIN 300 MG PO CAPS
600.0000 mg | ORAL_CAPSULE | Freq: Every day | ORAL | Status: DC
  Administered 2023-09-18 – 2023-09-20 (×3): 600 mg via ORAL
  Filled 2023-09-18 (×3): qty 2

## 2023-09-18 MED ORDER — ~~LOC~~ CARDIAC SURGERY, PATIENT & FAMILY EDUCATION: Freq: Once | Status: AC

## 2023-09-18 NOTE — Progress Notes (Signed)
 [facility] DAILY ICU PROGRESS NOTE                   [address]            [place] [address]          [phone]   2 Days Post-Op Procedure(s) (LRB): CORONARY ARTERY BYPASS GRAFTING X 2, USING LEFT INTERNAL MAMMARY ARTERY AND ENDOSCOPIC HARVESTED RIGHT GREATER SAPHENOUS VEIN (N/A) ECHOCARDIOGRAM, TRANSESOPHAGEAL (N/A)  Total Length of Stay:  LOS: 2 days   Subjective: Up in the bedside chair. Said he did not sleep at all last night despite getting his usual trazodone  at bedtime. Also complains of bloating. He has been passing gas.   NorEpi  off since yesterday.  Currently on RA with O2 sat ~90%.  Objective: Vital signs in last 24 hours: Temp:  [97.7 F (36.5 C)-100.2 F (37.9 C)] 99.7 F (37.6 C) (06/25 0500) Pulse Rate:  [60-79] 75 (06/25 0500) Cardiac Rhythm: Normal sinus rhythm (06/24 2000) Resp:  [13-26] 23 (06/25 0500) BP: (90-132)/(60-85) 116/85 (06/25 0500) SpO2:  [88 %-97 %] 93 % (06/25 0500) Arterial Line BP: (99-123)/(42-51) 111/50 (06/24 1345) Weight:  [88.9 kg] 88.9 kg (06/25 0500)  Filed Weights   09/16/23 0546 09/17/23 0600 09/18/23 0500  Weight: 89.8 kg 94.5 kg 88.9 kg    Weight change: -5.63 kg   Hemodynamic parameters for last 24 hours: CVP:  [0 mmHg-3 mmHg] 2 mmHg CO:  [4.7 L/min-6.4 L/min] 5 L/min CI:  [2.2 L/min/m2-3 L/min/m2] 2.4 L/min/m2  Intake/Output from previous day: 06/24 0701 - 06/25 0700 In: 257.5 [I.V.:57.5; IV Piggyback:200] Out: 4850 [Urine:4750; Chest Tube:100]  Intake/Output this shift: No intake/output data recorded.  Current Meds: Scheduled Meds:  acetaminophen   1,000 mg Oral Q6H   Or   acetaminophen  (TYLENOL ) oral liquid 160 mg/5 mL  1,000 mg Per Tube Q6H   aspirin  EC  325 mg Oral Daily   Or   aspirin   324 mg Per Tube Daily   bisacodyl   10 mg Oral Daily   Or   bisacodyl   10 mg Rectal Daily   budesonide -glycopyrrolate -formoterol   2 puff Inhalation BID   buPROPion   150 mg Oral Daily   Chlorhexidine   Gluconate Cloth  6 each Topical Daily   docusate sodium   200 mg Oral Daily   enoxaparin  (LOVENOX ) injection  40 mg Subcutaneous QHS   gabapentin   600 mg Oral BID   insulin  aspart  2-6 Units Subcutaneous Q4H   metoCLOPramide  (REGLAN ) injection  10 mg Intravenous Q6H   metoprolol  tartrate  12.5 mg Oral BID   Or   metoprolol  tartrate  12.5 mg Per Tube BID   mirabegron  ER  25 mg Oral Daily   montelukast   10 mg Oral QHS   pantoprazole   40 mg Oral Daily   potassium chloride   20 mEq Oral Q4H   rosuvastatin   40 mg Oral QHS   sodium chloride  flush  3 mL Intravenous Q12H   sodium chloride  flush  3-10 mL Intravenous Q12H   sodium chloride  flush  3-10 mL Intravenous Q12H   sodium chloride  flush  3-10 mL Intravenous Q12H   tamsulosin   0.4 mg Oral QPC supper   traZODone   100 mg Oral QHS   Continuous Infusions:  albumin human 999 mL/hr at 09/18/23 0100   norepinephrine  (LEVOPHED ) Adult infusion 1 mcg/min (09/17/23 1117)   PRN Meds:.albumin human, metoprolol  tartrate, midazolam , morphine  injection, ondansetron  (ZOFRAN ) IV, mouth rinse, oxyCODONE , sodium chloride  flush, sodium chloride  flush,  sodium chloride  flush, sodium chloride  flush, traMADol   General appearance: alert, cooperative, and no distress Neurologic: intact Heart: Regular rate and rhythm, monitor showing normal sinus rhythm with no significant arrhythmias. Lungs: Normal work of breathing on RA with marginal O2 sat.  Breath sounds are clear to auscultation anteriorly.  Chest x-ray with no unexpected features.  Lung fields well-expanded.  Minimal chest tube drainage. Abdomen: Active bowel sounds, soft and mild distension but no tenderness Extremities: All warm and well-perfused.  No edema. The RLE incision is covered with a dry dressing Wound: Sternotomy incision is covered with a dry Aquacel dressing.  Lab Results: CBC: Recent Labs    09/17/23 1652 09/18/23 0417  WBC 11.8* 12.7*  HGB 11.6* 11.2*  HCT 35.7* 35.0*  PLT 166 155    BMET:  Recent Labs    09/17/23 1652 09/18/23 0417  NA 135 133*  K 4.0 3.7  CL 102 102  CO2 25 24  GLUCOSE 93 68*  BUN 8 9  CREATININE 0.72 0.77  CALCIUM  7.8* 7.6*    CMET: Lab Results  Component Value Date   WBC 12.7 (H) 09/18/2023   HGB 11.2 (L) 09/18/2023   HCT 35.0 (L) 09/18/2023   PLT 155 09/18/2023   GLUCOSE 68 (L) 09/18/2023   CHOL 56 09/17/2023   TRIG 40 09/17/2023   HDL 29 (L) 09/17/2023   LDLCALC 19 09/17/2023   ALT 29 09/12/2023   AST 30 09/12/2023   NA 133 (L) 09/18/2023   K 3.7 09/18/2023   CL 102 09/18/2023   CREATININE 0.77 09/18/2023   BUN 9 09/18/2023   CO2 24 09/18/2023   INR 1.3 (H) 09/16/2023   HGBA1C 5.9 (H) 09/17/2023      PT/INR:  Recent Labs    09/16/23 1258  LABPROT 16.2*  INR 1.3*   Radiology: No results found.    Assessment/Plan: S/P Procedure(s) (LRB): CORONARY ARTERY BYPASS GRAFTING X 2, USING LEFT INTERNAL MAMMARY ARTERY AND ENDOSCOPIC HARVESTED RIGHT GREATER SAPHENOUS VEIN (N/A) ECHOCARDIOGRAM, TRANSESOPHAGEAL (N/A)  - Postop day 2 CABG x 2.  For complex proximal LAD/diagonal disease  with preserved biventricular function.  Stable hemodynamics and cardiac rhythm since surgery.  Blood pressure stable ~120 systolic off norepinephrine .  Will plan to remove the chest tubes today, continue to mobilize,   -Neuro: Grossly intact, pain control satisfactory.  -Pulm: Stable respiratory status since surgery.  Chest x-ray okay.  Continue work on pulmonary hygiene.    -Renal: Normal function at baseline.  Recorded weight isnow below pre-op after being 5kg positive yesterday. Doubt accuracy.    - GI: Abdominal exam is benign except for mild distension.  Active bowel sounds.  Will continue the Reglan  for another 24 hours, encourage mobility, avoid the carbonated sodas.  - Endo: No history of diabetes.  CBG's 60's-80's. Continue to monitor.  - DVT prophylaxis: mobilize, enoxaparin .   -Disposition: d/c the chest tubes, transfer to  [facility] today. Anticipate discharge to home Friday.   Adolph Clutter G. Lahna Nath, PA-C 09/18/2023 7:26 AM

## 2023-09-18 NOTE — Progress Notes (Signed)
      [address]       [place] [address]             [phone]                 2 Days Post-Op Procedure(s) (LRB): CORONARY ARTERY BYPASS GRAFTING X 2, USING LEFT INTERNAL MAMMARY ARTERY AND ENDOSCOPIC HARVESTED RIGHT GREATER SAPHENOUS VEIN (N/A) ECHOCARDIOGRAM, TRANSESOPHAGEAL (N/A)   Events: No events Some bloating, passing flatus _______________________________________________________________ Vitals: BP 116/85   Pulse 75   Temp 99.7 F (37.6 C)   Resp (!) 23   Ht 6' (1.829 m)   Wt 88.9 kg   SpO2 93%   BMI 26.58 kg/m  Filed Weights   09/16/23 0546 09/17/23 0600 09/18/23 0500  Weight: 89.8 kg 94.5 kg 88.9 kg     - Neuro: alert NAD  - Cardiovascular: sinus  Drips: none.   CVP:  [0 mmHg-3 mmHg] 2 mmHg CO:  [4.7 L/min-6.4 L/min] 5 L/min CI:  [2.2 L/min/m2-3 L/min/m2] 2.4 L/min/m2  - Pulm: EWOB    ABG    Component Value Date/Time   PHART 7.309 (L) 09/16/2023 1657   PCO2ART 40.9 09/16/2023 1657   PO2ART 88 09/16/2023 1657   HCO3 20.6 09/16/2023 1657   TCO2 22 09/16/2023 1657   ACIDBASEDEF 5.0 (H) 09/16/2023 1657   O2SAT 96 09/16/2023 1657    - Abd: ND - Extremity: warm  .Intake/Output      06/24 0701 06/25 0700 06/25 0701 06/26 0700   I.V. (mL/kg) 57.5 (0.6)    Blood     IV Piggyback 200    Total Intake(mL/kg) 257.5 (2.9)    Urine (mL/kg/hr) 4750 (2.2)    Blood     Chest Tube 100    Total Output 4850    Net -4592.5            _______________________________________________________________ Labs:    Latest Ref Rng & Units 09/18/2023    4:17 AM 09/17/2023    4:52 PM 09/17/2023    3:53 AM  CBC  WBC 4.0 - 10.5 K/uL 12.7  11.8  12.1   Hemoglobin 13.0 - 17.0 g/dL 88.7  88.3  88.9   Hematocrit 39.0 - 52.0 % 35.0  35.7  34.0   Platelets 150 - 400 K/uL 155  166  172       Latest Ref Rng & Units 09/18/2023    4:17 AM 09/17/2023    4:52 PM 09/17/2023    3:53 AM  CMP  Glucose 70 - 99 mg/dL 68  93  882   BUN 8 - 23 mg/dL 9   8  10    Creatinine 0.61 - 1.24 mg/dL 9.22  9.27  9.21   Sodium 135 - 145 mmol/L 133  135  134   Potassium 3.5 - 5.1 mmol/L 3.7  4.0  4.0   Chloride 98 - 111 mmol/L 102  102  106   CO2 22 - 32 mmol/L 24  25  24    Calcium  8.9 - 10.3 mg/dL 7.6  7.8  7.5     CXR: stable  _______________________________________________________________  Assessment and Plan: POD 1 s/p CABG  Neuro: pain controlled CV: titrating BB.  On A/S Pulm: will remove chest tube Renal: creat stable, diuresing GI: on diet Heme: stable ID: [email or web address] Endo: SSI Dispo: floor   Allen Tapia 09/18/2023 7:39 AM

## 2023-09-18 NOTE — Anesthesia Postprocedure Evaluation (Signed)
 Anesthesia Post Note  Patient: Allen Tapia  Procedure(s) Performed: CORONARY ARTERY BYPASS GRAFTING X 2, USING LEFT INTERNAL MAMMARY ARTERY AND ENDOSCOPIC HARVESTED RIGHT GREATER SAPHENOUS VEIN (Chest) ECHOCARDIOGRAM, TRANSESOPHAGEAL     Patient location during evaluation: ICU Anesthesia Type: General Level of consciousness: awake Pain management: pain level controlled Vital Signs Assessment: post-procedure vital signs reviewed and stable Respiratory status: spontaneous breathing, nonlabored ventilation and respiratory function stable Cardiovascular status: blood pressure returned to baseline and stable Postop Assessment: no apparent nausea or vomiting Anesthetic complications: no   There were no known notable events for this encounter.  Last Vitals:  Vitals:   09/18/23 0700 09/18/23 0800  BP: 103/75 102/73  Pulse: 74 77  Resp: 18 18  Temp: 36.7 C   SpO2: 90% 93%    Last Pain:  Vitals:   09/18/23 0800  TempSrc:   PainSc: 0-No pain                 Khadeeja Elden P Misha Antonini

## 2023-09-18 NOTE — Progress Notes (Signed)
 Patient arrived at the unit from [facility] bath given, vitals taken,CCMD notified,pt oriented to the unit and call bell in reach

## 2023-09-19 LAB — BASIC METABOLIC PANEL WITH GFR
Anion gap: 6 (ref 5–15)
BUN: 12 mg/dL (ref 8–23)
CO2: 29 mmol/L (ref 22–32)
Calcium: 7.9 mg/dL — ABNORMAL LOW (ref 8.9–10.3)
Chloride: 102 mmol/L (ref 98–111)
Creatinine, Ser: 0.8 mg/dL (ref 0.61–1.24)
GFR, Estimated: >60 mL/min (ref >60)
Glucose, Bld: 103 mg/dL — ABNORMAL HIGH (ref 70–99)
Potassium: 4 mmol/L (ref 3.5–5.1)
Sodium: 137 mmol/L (ref 135–145)

## 2023-09-19 LAB — GLUCOSE, CAPILLARY
Glucose-Capillary: 108 mg/dL — ABNORMAL HIGH (ref 70–99)
Glucose-Capillary: 113 mg/dL — ABNORMAL HIGH (ref 70–99)
Glucose-Capillary: 115 mg/dL — ABNORMAL HIGH (ref 70–99)
Glucose-Capillary: 116 mg/dL — ABNORMAL HIGH (ref 70–99)
Glucose-Capillary: 119 mg/dL — ABNORMAL HIGH (ref 70–99)
Glucose-Capillary: 135 mg/dL — ABNORMAL HIGH (ref 70–99)
Glucose-Capillary: 92 mg/dL (ref 70–99)

## 2023-09-19 LAB — CBC
HCT: 33.7 % — ABNORMAL LOW (ref 39.0–52.0)
Hemoglobin: 11.3 g/dL — ABNORMAL LOW (ref 13.0–17.0)
MCH: 31.7 pg (ref 26.0–34.0)
MCHC: 33.5 g/dL (ref 30.0–36.0)
MCV: 94.7 fL (ref 80.0–100.0)
Platelets: 165 10*3/uL (ref 150–400)
RBC: 3.56 MIL/uL — ABNORMAL LOW (ref 4.22–5.81)
RDW: 13.9 % (ref 11.5–15.5)
WBC: 10 10*3/uL (ref 4.0–10.5)
nRBC: 0 % (ref 0.0–0.2)

## 2023-09-19 MED ORDER — AMIODARONE IV BOLUS ONLY 150 MG/100ML
150.0000 mg | Freq: Once | INTRAVENOUS | Status: AC
  Administered 2023-09-19: 150 mg via INTRAVENOUS
  Filled 2023-09-19: qty 100

## 2023-09-19 MED ORDER — LACTULOSE 10 GM/15ML PO SOLN
20.0000 g | Freq: Once | ORAL | Status: AC
  Administered 2023-09-19: 20 g via ORAL
  Filled 2023-09-19: qty 30

## 2023-09-19 MED ORDER — AMIODARONE HCL 200 MG PO TABS
400.0000 mg | ORAL_TABLET | Freq: Two times a day (BID) | ORAL | Status: DC
  Administered 2023-09-19 – 2023-09-21 (×5): 400 mg via ORAL
  Filled 2023-09-19 (×5): qty 2

## 2023-09-19 MED FILL — Heparin Sodium (Porcine) Inj 1000 Unit/ML: INTRAMUSCULAR | Qty: 30 | Status: AC

## 2023-09-19 MED FILL — Lidocaine HCl Local Soln Prefilled Syringe 100 MG/5ML (2%): INTRAMUSCULAR | Qty: 5 | Status: AC

## 2023-09-19 MED FILL — Lidocaine HCl Local Preservative Free (PF) Inj 2%: INTRAMUSCULAR | Qty: 14 | Status: AC

## 2023-09-19 MED FILL — Calcium Chloride Inj 10%: INTRAVENOUS | Qty: 10 | Status: AC

## 2023-09-19 MED FILL — Electrolyte-R (PH 7.4) Solution: INTRAVENOUS | Qty: 3000 | Status: AC

## 2023-09-19 MED FILL — Sodium Chloride IV Soln 0.9%: INTRAVENOUS | Qty: 300 | Status: AC

## 2023-09-19 MED FILL — Magnesium Sulfate Inj 50%: INTRAMUSCULAR | Qty: 10 | Status: AC

## 2023-09-19 MED FILL — Heparin Sodium (Porcine) Inj 1000 Unit/ML: Qty: 1000 | Status: AC

## 2023-09-19 MED FILL — Sodium Bicarbonate IV Soln 8.4%: INTRAVENOUS | Qty: 50 | Status: AC

## 2023-09-19 MED FILL — Potassium Chloride Inj 2 mEq/ML: INTRAVENOUS | Qty: 40 | Status: AC

## 2023-09-19 MED FILL — Mannitol IV Soln 20%: INTRAVENOUS | Qty: 500 | Status: AC

## 2023-09-19 NOTE — Progress Notes (Signed)
 [address]            [place] [address]          [phone]   3 Days Post-Op Procedure(s) (LRB): CORONARY ARTERY BYPASS GRAFTING X 2, USING LEFT INTERNAL MAMMARY ARTERY AND ENDOSCOPIC HARVESTED RIGHT GREATER SAPHENOUS VEIN (N/A) ECHOCARDIOGRAM, TRANSESOPHAGEAL (N/A)  Total Length of Stay:  LOS: 3 days   Subjective: Transferred from ICU yesterday.  Feels he is progressing, pain controlled.  Passing gas, no BM yet.  Remains on RA with O2 sat ~90%.  Objective: Vital signs in last 24 hours: Temp:  [97.4 F (36.3 C)-99.6 F (37.6 C)] 98.4 F (36.9 C) (06/26 0803) Pulse Rate:  [63-92] 77 (06/26 0803) Cardiac Rhythm: Normal sinus rhythm (06/25 2021) Resp:  [14-26] 15 (06/26 0803) BP: (79-135)/(55-87) 105/71 (06/26 0803) SpO2:  [90 %-96 %] 93 % (06/26 0803) Weight:  [92.4 kg] 92.4 kg (06/26 0327)  Filed Weights   09/17/23 0600 09/18/23 0500 09/19/23 0327  Weight: 94.5 kg 88.9 kg 92.4 kg    Weight change: 3.5 kg   Hemodynamic parameters for last 24 hours:    Intake/Output from previous day: 06/25 0701 - 06/26 0700 In: 820 [P.O.:720; IV Piggyback:100] Out: 400 [Urine:400]  Intake/Output this shift: No intake/output data recorded.  Current Meds: Scheduled Meds:  acetaminophen   1,000 mg Oral Q6H   Or   acetaminophen  (TYLENOL ) oral liquid 160 mg/5 mL  1,000 mg Per Tube Q6H   aspirin  EC  325 mg Oral Daily   Or   aspirin   324 mg Per Tube Daily   bisacodyl   10 mg Oral Daily   Or   bisacodyl   10 mg Rectal Daily   budesonide -glycopyrrolate -formoterol   2 puff Inhalation BID   buPROPion   150 mg Oral Daily   Chlorhexidine  Gluconate Cloth  6 each Topical Daily   docusate sodium   200 mg Oral Daily   enoxaparin  (LOVENOX ) injection  40 mg Subcutaneous QHS   furosemide  40 mg Oral Daily   gabapentin   600 mg Oral Daily   gabapentin   600 mg Oral QAC supper   insulin  aspart  2-6 Units Subcutaneous Q4H   metoCLOPramide  (REGLAN )  injection  10 mg Intravenous Q6H   metoprolol  tartrate  25 mg Oral BID   mirabegron  ER  25 mg Oral Daily   montelukast   10 mg Oral QHS   pantoprazole   40 mg Oral Daily   rosuvastatin   40 mg Oral QHS   sodium chloride  flush  3 mL Intravenous Q12H   sodium chloride  flush  3 mL Intravenous Q12H   sodium chloride  flush  3-10 mL Intravenous Q12H   sodium chloride  flush  3-10 mL Intravenous Q12H   sodium chloride  flush  3-10 mL Intravenous Q12H   tamsulosin   0.4 mg Oral QPC supper   traZODone   100 mg Oral QHS   Continuous Infusions:  albumin human 999 mL/hr at 09/18/23 1000   PRN Meds:.albumin human, metoprolol  tartrate, midazolam , morphine  injection, ondansetron  (ZOFRAN ) IV, mouth rinse, oxyCODONE , sodium chloride  flush, sodium chloride  flush, sodium chloride  flush, sodium chloride  flush, sodium chloride  flush, traMADol   General appearance: alert, cooperative, and no distress Neurologic: intact Heart: Currently in SR but has had bigeminal PAC's and PAF this morning. Lungs: Normal work of breathing on RA with stable O2 sat.  Breath sounds are clear.  Abdomen: Active bowel sounds, soft and mild distension  but no tenderness Extremities: All warm and well-perfused.  No edema. The RLE incision is covered with a dry dressing Wound: Sternotomy incision is covered with a dry Aquacel dressing.  Lab Results: CBC: Recent Labs    09/18/23 0417 09/19/23 0411  WBC 12.7* 10.0  HGB 11.2* 11.3*  HCT 35.0* 33.7*  PLT 155 165   BMET:  Recent Labs    09/18/23 0417 09/19/23 0411  NA 133* 137  K 3.7 4.0  CL 102 102  CO2 24 29  GLUCOSE 68* 103*  BUN 9 12  CREATININE 0.77 0.80  CALCIUM  7.6* 7.9*    CMET: Lab Results  Component Value Date   WBC 10.0 09/19/2023   HGB 11.3 (L) 09/19/2023   HCT 33.7 (L) 09/19/2023   PLT 165 09/19/2023   GLUCOSE 103 (H) 09/19/2023   CHOL 56 09/17/2023   TRIG 40 09/17/2023   HDL 29 (L) 09/17/2023   LDLCALC 19 09/17/2023   ALT 29 09/12/2023   AST 30  09/12/2023   NA 137 09/19/2023   K 4.0 09/19/2023   CL 102 09/19/2023   CREATININE 0.80 09/19/2023   BUN 12 09/19/2023   CO2 29 09/19/2023   INR 1.3 (H) 09/16/2023   HGBA1C 5.9 (H) 09/17/2023      PT/INR:  Recent Labs    09/16/23 1258  LABPROT 16.2*  INR 1.3*   Radiology: No results found.    Assessment/Plan: S/P Procedure(s) (LRB): CORONARY ARTERY BYPASS GRAFTING X 2, USING LEFT INTERNAL MAMMARY ARTERY AND ENDOSCOPIC HARVESTED RIGHT GREATER SAPHENOUS VEIN (N/A) ECHOCARDIOGRAM, TRANSESOPHAGEAL (N/A)  - Postop day 3 CABG x 2.  For complex proximal LAD/diagonal disease  with preserved biventricular function.  Stable VS. Progressing with mobility.  -Post-op PAF: K+ 4.0. On metoprolol  at 25mg  BID. Load with oral amiodarone.  -Neuro: Grossly intact, pain control satisfactory.  -Pulm: Stable respiratory status since surgery.  Chest x-ray okay.  Continue work on pulmonary hygiene.    -Renal: Normal function at baseline.  Wts not accurate. Diuresing with oral Lasix.   - GI: Abdominal exam is benign except for mild distension.  No BM yet. Lactulose today.  - Endo: No history of diabetes.  CBG's 100-160's. Continue to monitor.  - DVT prophylaxis: mobilize, enoxaparin .   -Disposition: progressing well. Plan to discharge to home when rhythm stable.    Sadhana Frater G. Josephine Rudnick, PA-C 09/19/2023 8:16 AM

## 2023-09-19 NOTE — Progress Notes (Signed)
 11:20pm Mr. Eich had another episode of atrial fibrillation with RVR  this evening lasting ~45min.  He was treated with metoprolol  IV x 1 dose and converted back to SR.   Will give a bolus of IV amiodarone and continue oral loading.   EMERSON Becket, PA-C

## 2023-09-19 NOTE — Progress Notes (Signed)
 CARDIAC REHAB PHASE I   PRE:  Rate/Rhythm: 85 SR   BP:  Sitting: 120/77      SaO2: 97 RA   MODE:  Ambulation: 120 ft   POST:  Rate/Rhythm: 70 SR   BP:  Sitting: 105/70      SaO2: 98 RA   Pt ambulated in hallway, using front wheel walker. Pt tolerated well with no SOB or dizziness. Returned to bed with call bell and bedside table in reach. Encouraged mobility and IS use today. Will continue to follow.   [phone]  Riviello Asberry Hacking, RN BSN 09/19/2023 11:11 AM

## 2023-09-19 NOTE — Progress Notes (Signed)
 Mobility Specialist Progress Note:   09/19/23 1520  Mobility  Activity Ambulated with assistance to bathroom  Level of Assistance Standby assist, set-up cues, supervision of patient - no hands on  Assistive Device Front wheel walker  Distance Ambulated (ft) 20 ft  RUE Weight Bearing Per Provider Order WBAT  LUE Weight Bearing Per Provider Order WBAT  Activity Response Tolerated well  Mobility Referral Yes  Mobility visit 1 Mobility  Mobility Specialist Start Time (ACUTE ONLY) 1515  Mobility Specialist Stop Time (ACUTE ONLY) 1520  Mobility Specialist Time Calculation (min) (ACUTE ONLY) 5 min   Pt received in bed, requesting assistance to bathroom. SV with RW required for safety. Followed sternal precautions throughout. Left pt in bathroom, encouraged to use call bell when finished. Nursing assisted pt to chair. Left with all needs met.   Alta Shober Mobility Specialist Please contact via Special educational needs teacher or  Rehab office at [phone]

## 2023-09-20 ENCOUNTER — Other Ambulatory Visit (HOSPITAL_COMMUNITY): Payer: Self-pay

## 2023-09-20 ENCOUNTER — Telehealth (HOSPITAL_COMMUNITY): Payer: Self-pay | Admitting: Pharmacy Technician

## 2023-09-20 LAB — BASIC METABOLIC PANEL WITH GFR
Anion gap: 10 (ref 5–15)
BUN: 13 mg/dL (ref 8–23)
CO2: 29 mmol/L (ref 22–32)
Calcium: 8.1 mg/dL — ABNORMAL LOW (ref 8.9–10.3)
Chloride: 100 mmol/L (ref 98–111)
Creatinine, Ser: 0.73 mg/dL (ref 0.61–1.24)
GFR, Estimated: >60 mL/min (ref >60)
Glucose, Bld: 100 mg/dL — ABNORMAL HIGH (ref 70–99)
Potassium: 3.6 mmol/L (ref 3.5–5.1)
Sodium: 139 mmol/L (ref 135–145)

## 2023-09-20 LAB — GLUCOSE, CAPILLARY: Glucose-Capillary: 98 mg/dL (ref 70–99)

## 2023-09-20 LAB — MAGNESIUM: Magnesium: 2.3 mg/dL (ref 1.7–2.4)

## 2023-09-20 MED ORDER — POTASSIUM CHLORIDE CRYS ER 20 MEQ PO TBCR
40.0000 meq | EXTENDED_RELEASE_TABLET | Freq: Once | ORAL | Status: AC
  Administered 2023-09-20: 40 meq via ORAL
  Filled 2023-09-20: qty 2

## 2023-09-20 MED ORDER — POTASSIUM CHLORIDE CRYS ER 20 MEQ PO TBCR
20.0000 meq | EXTENDED_RELEASE_TABLET | Freq: Every day | ORAL | Status: DC
  Administered 2023-09-21: 20 meq via ORAL
  Filled 2023-09-20: qty 1

## 2023-09-20 MED ORDER — LACTULOSE 10 GM/15ML PO SOLN: 20.0000 g | Freq: Every day | ORAL | Status: DC | PRN

## 2023-09-20 MED ORDER — APIXABAN 5 MG PO TABS
5.0000 mg | ORAL_TABLET | Freq: Two times a day (BID) | ORAL | Status: DC
  Administered 2023-09-20 – 2023-09-21 (×3): 5 mg via ORAL
  Filled 2023-09-20 (×3): qty 1

## 2023-09-20 MED ORDER — ASPIRIN 81 MG PO TBEC
81.0000 mg | DELAYED_RELEASE_TABLET | Freq: Every day | ORAL | Status: DC
  Administered 2023-09-20 – 2023-09-21 (×2): 81 mg via ORAL
  Filled 2023-09-20 (×2): qty 1

## 2023-09-20 MED ORDER — LOPERAMIDE HCL 1 MG/7.5ML PO SUSP: 2.0000 mg | ORAL | Status: DC | PRN

## 2023-09-20 MED ORDER — AMPHETAMINE-DEXTROAMPHET ER 25 MG PO CP24: 25.0000 mg | ORAL_CAPSULE | Freq: Every day | ORAL | Status: DC | PRN

## 2023-09-20 NOTE — Progress Notes (Addendum)
      [address]       [place] [address]             [phone]      4 Days Post-Op Procedure(s) (LRB): CORONARY ARTERY BYPASS GRAFTING X 2, USING LEFT INTERNAL MAMMARY ARTERY AND ENDOSCOPIC HARVESTED RIGHT GREATER SAPHENOUS VEIN (N/A) ECHOCARDIOGRAM, TRANSESOPHAGEAL (N/A)  Subjective:  Patient tired, states he did not sleep at all last night.  He denies N/V.  He has moved his bowels  Objective: Vital signs in last 24 hours: Temp:  [97.8 F (36.6 C)-99.6 F (37.6 C)] 98.3 F (36.8 C) (06/27 0322) Pulse Rate:  [62-85] 65 (06/27 0322) Cardiac Rhythm: Atrial fibrillation;Other (Comment) (06/26 2150) Resp:  [15-18] 18 (06/27 0322) BP: (97-142)/(65-82) 100/67 (06/27 0322) SpO2:  [91 %-96 %] 92 % (06/27 0322) Weight:  [88.5 kg] 88.5 kg (06/27 0433)  Intake/Output from previous day: 06/26 0701 - 06/27 0700 In: 340 [P.O.:240; I.V.:100] Out: -   General appearance: alert, cooperative, and no distress Heart: regular rate and rhythm Lungs: clear to auscultation bilaterally Abdomen: soft, non-tender; bowel sounds normal; no masses,  no organomegaly Extremities: edema trace Wound: clean and dry  Lab Results: Recent Labs    09/18/23 0417 09/19/23 0411  WBC 12.7* 10.0  HGB 11.2* 11.3*  HCT 35.0* 33.7*  PLT 155 165   BMET:  Recent Labs    09/19/23 0411 09/20/23 0346  NA 137 139  K 4.0 3.6  CL 102 100  CO2 29 29  GLUCOSE 103* 100*  BUN 12 13  CREATININE 0.80 0.73  CALCIUM  7.9* 8.1*    PT/INR: No results for input(s): LABPROT, INR in the last 72 hours. ABG    Component Value Date/Time   PHART 7.309 (L) 09/16/2023 1657   HCO3 20.6 09/16/2023 1657   TCO2 22 09/16/2023 1657   ACIDBASEDEF 5.0 (H) 09/16/2023 1657   O2SAT 96 09/16/2023 1657   CBG (last 3)  Recent Labs    09/19/23 2036 09/19/23 2349 09/20/23 0449  GLUCAP 119* 92 98    Assessment/Plan: S/P Procedure(s) (LRB): CORONARY ARTERY BYPASS GRAFTING X 2, USING LEFT INTERNAL  MAMMARY ARTERY AND ENDOSCOPIC HARVESTED RIGHT GREATER SAPHENOUS VEIN (N/A) ECHOCARDIOGRAM, TRANSESOPHAGEAL (N/A)  CV- PAF, another episode overnight responded to IV Amiodarone - continue oral Amiodarone  load, Lopressor  25 mg BID.SABRA will discuss anticoagulation with Dr. Shyrl Carder- no acute issues, continue IS Renal- creatinine has been stable.. will continue Lasix , supplement potassium to keep level at 4 or above H/O ADHD- will resume Adderall prn CBGs controlled, patient is not a diabetic will d/c SSIP Dispo- patient is doing well.. continues to have brief episodes of PAF... continue Amiodarone  load, discuss anticoagulation with Dr. Shyrl.SABRA if can maintain NSR hopefully for d/c in AM   LOS: 4 days   Rocky Shad, PA-C 09/20/2023   Discussed with Dr. Shyrl, will start Eliquis for stroke prophylaxis in setting of PAF.  Rocky Shad, PA-C 8:08 AM 09/20/23  2 episodes of afib Will start eliquis Dispo planning  Talma Aguillard O Govanni Plemons

## 2023-09-20 NOTE — Telephone Encounter (Signed)
 Patient Product/process development scientist completed.    The patient is insured through [employer]. Patient has Medicare and is not eligible for a copay card, but may be able to apply for patient assistance or Medicare RX Payment Plan (Patient Must reach out to their plan, if eligible for payment plan), if available.    Ran test claim for Eliquis 5 mg and the current 30 day co-pay is $0.00.   This test claim was processed through [facility]- copay amounts may vary at other pharmacies due to pharmacy/plan contracts, or as the patient moves through the different stages of their insurance plan.     Roland Earl, CPHT Pharmacy Technician III Certified Patient Advocate [facility] Patient Advocate Team Direct Number: [phone]  Fax: [phone]

## 2023-09-20 NOTE — Progress Notes (Signed)
 Pt ambulated earlier and would prefer to wait till later today to walk again. Witnessed pt moving out of bed and walking to BR following sternal precautions. He can ambulate with wife.   Discussed with pt and wife IS, sternal precautions, diet, exercise, and CRPII. Pt receptive. Will refer to [facility] CRPII. Gave d/c video to view. His main c/o is lack of sleep.  [id number] Aliene Aris BS, ACSM-CEP 09/20/2023 11:28 AM

## 2023-09-20 NOTE — Progress Notes (Signed)
 Mobility Specialist Progress Note:    09/20/23 0935  Mobility  Activity Ambulated with assistance in hallway  Level of Assistance Standby assist, set-up cues, supervision of patient - no hands on  Assistive Device Front wheel walker  Distance Ambulated (ft) 350 ft  RUE Weight Bearing Per Provider Order NWB  LUE Weight Bearing Per Provider Order NWB  Activity Response Tolerated well  Mobility Referral Yes  Mobility visit 1 Mobility  Mobility Specialist Start Time (ACUTE ONLY) 0935  Mobility Specialist Stop Time (ACUTE ONLY) [id number]  Mobility Specialist Time Calculation (min) (ACUTE ONLY) 7 min   Pt received leaving bathroom. Assisted pt with donning clean gown and socks. Pt requested to use bathroom again, void successful. Agreeable to ambulate in hallway. RW with SBA for safety. Tolerated well, followed sternal precautions. Returned pt to room, lying comfortably in bed. RN notified of pain. All needs met.    Genetta Fiero Mobility Specialist Please contact via Special educational needs teacher or  Rehab office at [phone]

## 2023-09-20 NOTE — Plan of Care (Signed)

## 2023-09-21 MED ORDER — METOPROLOL TARTRATE 25 MG PO TABS: 25.0000 mg | ORAL_TABLET | Freq: Two times a day (BID) | ORAL | 3 refills | Status: DC

## 2023-09-21 MED ORDER — TRAMADOL HCL 50 MG PO TABS: 50.0000 mg | ORAL_TABLET | Freq: Four times a day (QID) | ORAL | 0 refills | Status: AC | PRN

## 2023-09-21 MED ORDER — FUROSEMIDE 40 MG PO TABS
40.0000 mg | ORAL_TABLET | Freq: Every day | ORAL | Status: DC
  Administered 2023-09-21: 40 mg via ORAL
  Filled 2023-09-21: qty 1

## 2023-09-21 MED ORDER — POTASSIUM CHLORIDE CRYS ER 20 MEQ PO TBCR: 20.0000 meq | EXTENDED_RELEASE_TABLET | Freq: Every day | ORAL | 0 refills | Status: DC

## 2023-09-21 MED ORDER — AMIODARONE HCL 200 MG PO TABS: 400.0000 mg | ORAL_TABLET | Freq: Two times a day (BID) | ORAL | 2 refills | Status: DC

## 2023-09-21 MED ORDER — ROSUVASTATIN CALCIUM 20 MG PO TABS: 40.0000 mg | ORAL_TABLET | Freq: Every day | ORAL | 3 refills | Status: DC

## 2023-09-21 MED ORDER — FUROSEMIDE 40 MG PO TABS: 40.0000 mg | ORAL_TABLET | Freq: Every day | ORAL | 0 refills | Status: DC

## 2023-09-21 MED ORDER — POTASSIUM CHLORIDE CRYS ER 20 MEQ PO TBCR
40.0000 meq | EXTENDED_RELEASE_TABLET | Freq: Once | ORAL | Status: AC
  Administered 2023-09-21: 40 meq via ORAL
  Filled 2023-09-21: qty 2

## 2023-09-21 MED ORDER — APIXABAN 5 MG PO TABS: 5.0000 mg | ORAL_TABLET | Freq: Two times a day (BID) | ORAL | 1 refills | Status: DC

## 2023-09-21 NOTE — Progress Notes (Addendum)
      [address] [place] [place] [address]             [phone]      5 Days Post-Op Procedure(s) (LRB): CORONARY ARTERY BYPASS GRAFTING X 2, USING LEFT INTERNAL MAMMARY ARTERY AND ENDOSCOPIC HARVESTED RIGHT GREATER SAPHENOUS VEIN (N/A) ECHOCARDIOGRAM, TRANSESOPHAGEAL (N/A) Subjective: No complaints this AM, walked in the hall 5 times yesterday. Patient states he is ready to go home.   Objective: Vital signs in last 24 hours: Temp:  [98.6 F (37 C)-99.2 F (37.3 C)] 98.6 F (37 C) (06/28 0322) Pulse Rate:  [61-79] 61 (06/28 0322) Cardiac Rhythm: Normal sinus rhythm (06/27 1915) Resp:  [15-17] 17 (06/28 0322) BP: (102-120)/(66-80) 114/76 (06/28 0322) SpO2:  [94 %-99 %] 99 % (06/28 0322) Weight:  [87 kg] 87 kg (06/28 0322)  Hemodynamic parameters for last 24 hours:    Intake/Output from previous day: 06/27 0701 - 06/28 0700 In: 120 [P.O.:120] Out: -  Intake/Output this shift: No intake/output data recorded.  General appearance: alert, cooperative, and no distress Neurologic: intact Heart: regular rate and rhythm, S1, S2 normal, no murmur, click, rub or gallop Lungs: clear to auscultation bilaterally Abdomen: soft, non-tender; bowel sounds normal; no masses,  no organomegaly Extremities: extremities normal, atraumatic, no cyanosis or edema Wound: Clean and dry without sign of infection  Lab Results: Recent Labs    09/19/23 0411  WBC 10.0  HGB 11.3*  HCT 33.7*  PLT 165   BMET:  Recent Labs    09/19/23 0411 09/20/23 0346  NA 137 139  K 4.0 3.6  CL 102 100  CO2 29 29  GLUCOSE 103* 100*  BUN 12 13  CREATININE 0.80 0.73  CALCIUM  7.9* 8.1*    PT/INR: No results for input(s): LABPROT, INR in the last 72 hours. ABG    Component Value Date/Time   PHART 7.309 (L) 09/16/2023 1657   HCO3 20.6 09/16/2023 1657   TCO2 22 09/16/2023 1657   ACIDBASEDEF 5.0 (H) 09/16/2023 1657   O2SAT 96 09/16/2023 1657   CBG (last 3)  Recent Labs     09/19/23 2036 09/19/23 2349 09/20/23 0449  GLUCAP 119* 92 98    Assessment/Plan: S/P Procedure(s) (LRB): CORONARY ARTERY BYPASS GRAFTING X 2, USING LEFT INTERNAL MAMMARY ARTERY AND ENDOSCOPIC HARVESTED RIGHT GREATER SAPHENOUS VEIN (N/A) ECHOCARDIOGRAM, TRANSESOPHAGEAL (N/A)  Neuro: Pain controlled this AM  CV: Hx of PAF, on Lopressor  25mg  BID, Amiodarone  400mg  BID and Eliquis.No further afib. SBP 110-120. NSR, HR 60s this AM.   Pulm: Saturating well on RA this AM. Last CXR with bibasilar atelectasis, likely small left pleural effusion on 06/25. Encourage IS and ambulation.  GI: +BM tolerating a diet  Endo: Prediabetes, A1C 5.9. CBGs 119/92/98. No home meds. Recommend close follow up with PCP.   Renal: Cr 0.73. On Lasix  40mg  daily. No UO recorded. Under preop weight. Will continue 3 days of Lasix  for pleural effusions. K 3.6, supplement. On home Myrbetriq  and flomax .   ID: No leukocytosis, Tmax 99.2. No sign of infection  Expected postop ABLA: H/H 11.3/33.7, not clinically significant at this time.   DVT Prophylaxis: On Eliquis for afib   Dispo: Plan to d/c home today    LOS: 5 days    Con GORMAN Bend, PA-C 09/21/2023

## 2023-09-22 ENCOUNTER — Other Ambulatory Visit: Payer: Self-pay | Admitting: Physician Assistant

## 2023-09-23 ENCOUNTER — Ambulatory Visit: Payer: Self-pay | Attending: Thoracic Surgery (Cardiothoracic Vascular Surgery) | Admitting: Surgical

## 2023-09-23 ENCOUNTER — Other Ambulatory Visit: Payer: Self-pay | Admitting: Physician Assistant

## 2023-09-23 VITALS — BP 85/59 | HR 65 | Resp 20 | Wt 191.0 lb

## 2023-09-23 DIAGNOSIS — Z951 Presence of aortocoronary bypass graft: Secondary | ICD-10-CM

## 2023-09-23 NOTE — Progress Notes (Signed)
 [address]       [place] [address]             [phone]      [name] [facility] Medical Record #[id number] Date of Birth: May 28, 1955  Referring: Diedra Lame, MD Primary Care: Diedra Lame, MD Primary Cardiologist: Shelda Bruckner, MD   Chief Complaint:   POST OP FOLLOW UP    09/16/2023 Patient:  Allen Tapia Pre-Op Dx: LILLA CAD                           HTN                         HLP Post-op Dx:  same Procedure: CABG X 2.  LIMA LAD, RSVG diagonal   Endoscopic greater saphenous vein harvest on the right     Surgeon and Role:      * Lightfoot, Linnie KIDD, MD - Primary    * EMERSON Becket , PA-C - assisting   History of Present Illness:    Patient is a 68 year old male seen in the office on today's date status post the above described procedure.  He called the office this morning reporting that there was some redness in the right groin region EVH stab and grab site.  He reports a temperature of 99.6.  Reportedly there was slight drainage yesterday and was cleaned and triple antibiotic was applied by wife.  He was concerned due to being somewhat immunocompromised for his multiple sclerosis by kesimpta.      Past Medical History:  Diagnosis Date   Anginal pain (HCC)    atypical chest pain cardiac cath performed at that time   Arthritis    Asthma    Atypical chest pain    Barrett's esophagus    Bilateral cataracts    BPH (benign prostatic hyperplasia)    Cancer (HCC)    skin ca in face and hand   Chicken pox    Chronic constipation    no current problem per pt 09/12/23   COPD (chronic obstructive pulmonary disease) (HCC)    Coronary artery disease 07/14/2007   a.) LHC 07/14/2007: 40% pRCA-1, 40% pRCA-2, 40% dRCA, 20% RPL1, 30% LM, 20% mLCx-1, 40% mLCx-2, 20% OM3-1, 20% OM3-2, 20% pLAD, 40% mLAD-1, 70% mLAD-2, 40% mLAD-3, 30% dLAD, 40% D1, 40% D2 --> med mgmt.   Depression    Foreign body (FB) in soft tissue     Former tobacco use    GERD (gastroesophageal reflux disease)    History of 2019 novel coronavirus disease (COVID-19) 04/07/2019   a.) tested (+) at [facility] 04/16/2019, 05/30/2021; b.) 2 other (+) tests at home (dates unknown)   History of kidney stones    Hyperlipidemia    Hyperplastic colon polyp    Hypertension    Infrarenal abdominal aortic aneurysm (AAA) without rupture (HCC)    a.) MR lumbar spine 12/30/2020: infrarenal saccular AAA measuring 2.2 cm; b.) CTA A/P 01/05/2021: measured 2.6 cm; c.) s/p EVAR 03/03/2021   Ischemic heart disease 12/20/2010   Long term current use of immunosuppressive drug    a.) on ofatumumab for MS   MS (multiple sclerosis) (HCC)    a.) on ofatumumab   Neuromuscular disorder (HCC)    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 - takes gabapentin    Pneumonia    x several   Pre-diabetes    Shingles  Tubular adenoma of colon 08/23/2016     Social History   Tobacco Use  Smoking Status Former   Current packs/day: 0.00   Average packs/day: 1 pack/day for 32.5 years (32.5 ttl pk-yrs)   Types: Cigarettes   Start date: 34   Quit date: 09/23/2006   Years since quitting: 17.0  Smokeless Tobacco Never    Social History   Substance and Sexual Activity  Alcohol  Use Not Currently   Comment: none in last 15 yrs per pt 09/12/23     No Known Allergies  Current Outpatient Medications  Medication Sig Dispense Refill   acetaminophen  (TYLENOL ) 500 MG tablet Take 2 tablets (1,000 mg total) by mouth every 6 (six) hours as needed for mild pain. 60 tablet 0   amiodarone  (PACERONE ) 200 MG tablet Take 2 tablets (400 mg total) by mouth 2 (two) times daily. For 10 days then take 1 tablet (200mg ) TWICE daily for 14 days then take 1 tablet (200mg ) ONCE daily thereafter. 60 tablet 2   amphetamine -dextroamphetamine (ADDERALL XR) 25 MG 24 hr capsule Take 25 mg by mouth daily as needed (focus).     apixaban (ELIQUIS) 5 MG TABS tablet Take 1 tablet (5 mg total) by mouth 2 (two) times  daily. 60 tablet 1   aspirin  EC 81 MG tablet Take 81 mg by mouth daily. Swallow whole.     baclofen  (LIORESAL ) 20 MG tablet Take 30 mg by mouth 3 (three) times daily.     BREZTRI  AEROSPHERE 160-9-4.8 MCG/ACT AERO USE 2 INHALATIONS IN THE MORNING AND AT BEDTIME 32.1 g 3   buPROPion  (WELLBUTRIN  XL) 150 MG 24 hr tablet Take 150 mg by mouth daily.     furosemide  (LASIX ) 40 MG tablet Take 1 tablet (40 mg total) by mouth daily. Take for 3 days then stop 3 tablet 0   gabapentin  (NEURONTIN ) 300 MG capsule Take 1-2 capsules (300-600 mg total) by mouth 2 (two) times daily. 600 mg in am and 300 mg in afternoon (Patient taking differently: Take 600 mg by mouth See admin instructions. Take 600 by mouth in the morning and 600 in the late evening) 60 capsule 0   metoprolol  tartrate (LOPRESSOR ) 25 MG tablet Take 1 tablet (25 mg total) by mouth 2 (two) times daily. 60 tablet 3   montelukast  (SINGULAIR ) 10 MG tablet TAKE 1 TABLET AT BEDTIME 90 tablet 3   Multiple Vitamins-Minerals (MULTIVITAMIN WITH MINERALS) tablet Take 1 tablet by mouth daily.     Ofatumumab (KESIMPTA) 20 MG/0.4ML SOAJ Inject 20 mg into the skin every 30 (thirty) days. 1st day of every month     pantoprazole  (PROTONIX ) 20 MG tablet Take 20 mg by mouth at bedtime.     Polyethyl Glycol-Propyl Glycol (SYSTANE) 0.4-0.3 % SOLN Place 1 drop into both eyes 4 (four) times daily as needed (dry eyes).     potassium chloride  SA (KLOR-CON  M) 20 MEQ tablet Take 1 tablet (20 mEq total) by mouth daily. Take for 3 days then stop 3 tablet 0   rosuvastatin  (CRESTOR ) 20 MG tablet Take 2 tablets (40 mg total) by mouth at bedtime. 90 tablet 3   Spacer/Aero-Holding Chambers (AEROCHAMBER MV) inhaler Use as instructed 1 each 0   tamsulosin  (FLOMAX ) 0.4 MG CAPS capsule TAKE 1 CAPSULE DAILY (Patient taking differently: Take 0.4 mg by mouth at bedtime.) 90 capsule 3   traMADol  (ULTRAM ) 50 MG tablet Take 1 tablet (50 mg total) by mouth every 6 (six) hours as needed for up to  7 days for moderate pain (pain score 4-6). 28 tablet 0   traZODone  (DESYREL ) 100 MG tablet Take 100 mg by mouth at bedtime.     VENTOLIN  HFA 108 (90 Base) MCG/ACT inhaler Inhale 2 puffs into the lungs every 4 (four) hours as needed for wheezing or shortness of breath. 3 each 4   Vibegron  (GEMTESA ) 75 MG TABS Take 1 tablet (75 mg total) by mouth daily. 90 tablet 3   VITAMIN D  PO Take 1 capsule by mouth daily.     No current facility-administered medications for this visit.       Physical Exam: BP (!) 85/59   Pulse 65   Resp 20   Wt 191 lb (86.6 kg)   SpO2 96% Comment: RA  BMI 25.90 kg/m   Repeat BP 95/68  General appearance: alert, cooperative, and no distress Heart: regular rate and rhythm Lungs: Minimally diminished in the left base Abdomen: Benign Extremities: Trace lower extremity edema Wound: The right groin EVH site is slightly hyperemic.  There is no purulence.  There is some moderate thigh ecchymosis.  There does not appear to be a hematoma or seroma.  There is no purulence.   Diagnostic Studies & Laboratory data:     Recent Radiology Findings:   No results found.    Recent Lab Findings: Lab Results  Component Value Date   WBC 10.0 09/19/2023   HGB 11.3 (L) 09/19/2023   HCT 33.7 (L) 09/19/2023   PLT 165 09/19/2023   GLUCOSE 100 (H) 09/20/2023   CHOL 56 09/17/2023   TRIG 40 09/17/2023   HDL 29 (L) 09/17/2023   LDLCALC 19 09/17/2023   ALT 29 09/12/2023   AST 30 09/12/2023   NA 139 09/20/2023   K 3.6 09/20/2023   CL 100 09/20/2023   CREATININE 0.73 09/20/2023   BUN 13 09/20/2023   CO2 29 09/20/2023   INR 1.3 (H) 09/16/2023   HGBA1C 5.9 (H) 09/17/2023      Assessment / Plan: The EVH site wound looks a bit inflammatory in nature.  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  His blood pressure initially in the office was low but he did improve after sitting for a few minutes.  I did not make any further adjustments to his medicine.   He is scheduled only 1 more dose of Lasix .  He knows to keep hydrated.  We will keep previously scheduled appointments and he will contact the office if there is any worsening or more concerning findings.      Medication Changes: No orders of the defined types were placed in this encounter.     Schon Zeiders E Alizaya Oshea, PA-C  09/23/2023 1:19 PM

## 2023-09-23 NOTE — Patient Instructions (Signed)
 Keep close eye on incision for any worsening of redness or drainage

## 2023-09-24 ENCOUNTER — Telehealth: Payer: Self-pay

## 2023-09-24 ENCOUNTER — Ambulatory Visit
Payer: Self-pay | Attending: Thoracic Surgery (Cardiothoracic Vascular Surgery) | Admitting: Thoracic Surgery (Cardiothoracic Vascular Surgery)

## 2023-09-24 DIAGNOSIS — Z951 Presence of aortocoronary bypass graft: Secondary | ICD-10-CM

## 2023-09-24 DIAGNOSIS — Z48812 Encounter for surgical aftercare following surgery on the circulatory system: Secondary | ICD-10-CM | POA: Diagnosis not present

## 2023-09-24 NOTE — Progress Notes (Signed)
     [address]       [name] [address]             [phone]       Patient: Home Provider: Office Consent for Telemedicine visit obtained.  Today's visit was completed via a real-time telehealth (see specific modality noted below). The patient/authorized person provided oral consent at the time of the visit to engage in a telemedicine encounter with the present provider at [facility]. The patient/authorized person was informed of the potential benefits, limitations, and risks of telemedicine. The patient/authorized person expressed understanding that the laws that protect confidentiality also apply to telemedicine. The patient/authorized person acknowledged understanding that telemedicine does not provide emergency services and that he or she would need to call 911 or proceed to the nearest hospital for help if such a need arose.   Total time spent in the clinical discussion 10 minutes.  Telehealth Modality: Phone visit (audio only)  I had a telephone visit with  Allen Tapia who is s/p CABG.  Overall doing well.  Pain is minimal.  Ambulating well. His blood pressure has been low, but he completes his lasix  today.  He has had a wound on his thigh that has been evaluated by our PA.    [name] FORBES Collier will see us  back in 1 month with a chest x-ray for cardiac rehab clearance.  Dorice Stiggers MALVA Rayas

## 2023-09-24 NOTE — Telephone Encounter (Signed)
 [place], PT with [facility] contacted the office requesting verbal orders for HHPT 2w1, 1w4, and every other week x4 wks. Verbal orders given and receipt acknowledged.

## 2023-10-02 NOTE — Progress Notes (Signed)
 / Cardiology Office Note:  .   Date:  10/07/2023  ID:  Allen Tapia, DOB 02-May-1955, MRN [id number] PCP: Allen Lame, MD  [facility] Providers Cardiologist:  Allen Bruckner, MD    Patient Profile: .      PMH Coronary artery disease LHC 06/2007 at [facility]ia LAD obstruction>>Med Rx Nonobstructive disease elswhere LHC 07/26/23 S/p CABG x 2 on 09/16/23 MS Hyperlipidemia Prior tobacco use Quit 2008 EVAR to saccular aneurysm of infrarenal abdominal aorta 02/2021 Squamous cell skin cancer Normal lipoprotein a level Prediabetes Family history heart disease Allen Tapia and Allen Tapia had CABG Ascending thoracic aortic aneurysm 4.2 cm on CT 06/06/2023  Prior LHC at [facility] 06/2007 with obstructive LAD only, not intervened on as no matching ischemia on stress testing/atypical symptoms, LVEF 65%.  Other vessels with nonobstructive disease.  He established with Allen Tapia 05/04/2019 at which time he reported taking Gilemya for multiple sclerosis for a number of years.  Initially had heart pounding but now tolerates.  He reported feeling like he was moving slower than in the past, losing strength/fine motor skills.  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  He had been retired for about 7 years at that time.  Last cardiology clinic visit was 05/20/2023 with Allen Tapia.  He presented with chest pain and elevated BP at home 135-150/80-90.  He had not had his BP checked in the office.  He reported pain at rest in his chair that occurs also when up and moving around at times.  Pain midsternal to left side with occasional radiation down his left arm.  He reported infrequent palpitations when resting.  He has chronic musculoskeletal pain secondary to MS but notes present pain feels different than previous musculoskeletal pain.  He is frustrated that he has not been as mobile and has gained weight, but ultimately attributes this to the season.  Family history of  Allen Tapia and Allen Tapia having CABG in the past. Amlodipine  5 mg daily was added. EKG was unremarkable but due to chest pain, cardiac PET was ordered. LDL was at goal.   Cardiac PET 06/06/23 revealed small area of apical ischemia but otherwise low risk with normal heart function. He was advised to start Imdur  30 mg daily.   Seen in clinic by me on 07/18/23 reported continued chest discomfort particularly in two locations, mid sternal and left breast. Pain most frequently at rest, but admittedly less active since starting Imdur . Pain improved with Imdur  but he is having some concerning side effects. He notes a constant dull headache and pain in the back of his neck.  He also feels more fatigued since starting it.  He is remaining active but admits it is a struggle. Symptoms have not significantly inhibited his activities. BP readings at home have been elevated. He brought his machine for calibration and his machine is reading higher than ours. No shortness of breath, palpitations, orthopnea, PND, edema, presyncope or syncope. He is very concerned about his chest pain due to the fact that both his Allen Tapia and Allen Tapia had CABG and did not have significant symptoms prior. He also had LAD stenosis on cath in 2009 which he believes was in the 70% range.  His Allen Tapia was hospitalized for 67 days due to complications post CABG.  He would like to undergo cardiac catheterization for further evaluation. Discussed plan with primary cardiologist, Allen Tapia, who agreed with diagnostic cath.  LHC completed 07/26/23 revealed mid LAD 90% stenosis,  first diagonal 70% stenosis, second diagonal 70% stenosis, and proximal RCA lesion 20% stenosis.  Given the complex disease and calcified mid LAD involving the diagonal branch, recommendation for CABG.  He underwent CABG x 2 (LIMA-LAD, RVSG-diagonal) with Allen Tapia on 09/16/2023 with postop A-fib started on amiodarone  and Eliquis  for stroke prevention.  He was discharged  09/21/2023.  He was seen by CT surgery on 09/23/2023 for redness and thigh wound, not overly concerning.  BP was soft but he was scheduled to take his last dose of Lasix .  Seen via telehealth by Allen Tapia on 09/24/2023 with no major concerns.        History of Present Illness: .    History of Present Illness Allen Tapia is a very pleasant 68 y.o. male who is here today for follow-up s/p CABG. He is accompanied by Allen Tapia, his wife. He underwent CABG 3 weeks prior and experiences significant sensitivity and pain in his left nipple, described as 'on fire' when touched. He is currently on multiple medications, for a total of  21 medications daily. He notes dizziness after taking metoprolol  manages it by staying seated for a few hours after taking it. Has lack of appetite and decreased motivation and is anxious to get back to normal. Weight loss of about seven pounds. Reports no chest pain prior to CABG. He denies chest pain, shortness of breath, lower extremity edema, palpitations, melena, presyncope, syncope, orthopnea, and PND.   Discussed the use of AI scribe software for clinical note transcription with the patient, who gave verbal consent to proceed.   ROS: See HPI       Studies Reviewed: SABRA         Lipoprotein (a)  Date/Time Value Ref Range Status  10/24/2022 10:22 AM 64.0 <75.0 nmol/L Final    Comment:    Note:  Values greater than or equal to 75.0 nmol/L may        indicate an independent risk factor for CHD,        but must be evaluated with caution when applied        to non-Caucasian populations due to the        influence of genetic factors on Lp(a) across        ethnicities.      Risk Assessment/Calculations:             Physical Exam:   VS:  BP 98/60   Pulse (!) 52   Ht 6' (1.829 m)   Wt 195 lb (88.5 kg)   SpO2 94%   BMI 26.45 kg/m    Wt Readings from Last 3 Encounters:  10/07/23 195 lb (88.5 kg)  09/23/23 191 lb (86.6 kg)  09/21/23 191 lb 14.4 oz (87  kg)    GEN: Well nourished, well developed in no acute distress NECK: No JVD; No carotid bruits CARDIAC: RRR, no murmurs, rubs, gallops RESPIRATORY:  Clear to auscultation without rales, wheezing or rhonchi  ABDOMEN: Soft, non-tender, non-distended EXTREMITIES:  No edema; No deformity     ASSESSMENT AND PLAN: .    Assessment & Plan Coronary artery disease with stable angina s/p CABG S/p CABG x 2 on 09/16/23 (LIMA-LAD, RSVG-diagonal), minimal residual disease in RCA.  He denies chest pain, dyspnea, or other symptoms concerning for angina.  Has pain in left breast consistent with location of LIMA harvest. is gradually increasing activity but remains fatigued and feels unmotivated.  BP and HR are soft.  We will reduce metoprolol  to  12.5 mg twice daily to see if fatigue improves. Monitor home BP and HR. No bleeding concerns.  Encouraged eating regularly for improved recovery/strengthening. Continue home PT. He is agreeable to start cardiac rehab when cleared by surgery. Keep follow-up appointment CT surgery.   Postop atrial fibrillation No tachypalpitations since Tapia discharge.  He was symptomatic during hospitalization with 2 episodes of a fib.  He is appropriately decreasing amiodarone  and is currently on 200 mg twice daily with plans to reduce to 200 mg daily in a few days. No bleeding concerns.  Continue Eliquis  5 mg twice daily for stroke prevention for CHA2DS2-VASc score of 2. Consider cardiac monitor in 3 months to quantify a fib.  Continue metoprolol  for rate control, however we will reduce dose to 12.5 mg twice daily due to fatigue.  Continue amiodarone  for rhythm control.  Thoracic Aortic dilatation   Mild aortic dilatation is noted on CT 06/08/23, measuring 4.2 cm. He is asymptomatic. We will get CTA aorta in March 2026 to monitor aortic dilatation.   History of AAA History of EVAR to saccular aneurysm of infrarenal abdominal aorta 02/2021. He is followed by Dr. Sheree. No acute  concerns today. Management per [facility].   Hyperlipidemia LDL goal < 55 Lipid panel completed 09/17/2023 with total cholesterol 56, triglycerides 40, HDL 29, and LDL 19.  Lipids are well-controlled.  He reports rosuvastatin  dose was increased from 20 mg to 40 mg during admission.  Plan for recheck of lipids in 3 months.  Continue rosuvastatin .    Cardiac Rehabilitation Eligibility Assessment  The patient is ready to start cardiac rehabilitation pending clearance from the cardiac surgeon.       Disposition: 3 months with Dr. Lonni  Signed, Rosaline Bane, Tapia-C

## 2023-10-07 ENCOUNTER — Ambulatory Visit (HOSPITAL_BASED_OUTPATIENT_CLINIC_OR_DEPARTMENT_OTHER): Admitting: Nurse Practitioner

## 2023-10-07 ENCOUNTER — Encounter (HOSPITAL_BASED_OUTPATIENT_CLINIC_OR_DEPARTMENT_OTHER): Payer: Self-pay | Admitting: Nurse Practitioner

## 2023-10-07 VITALS — BP 98/60 | HR 52 | Ht 72.0 in | Wt 195.0 lb

## 2023-10-07 DIAGNOSIS — Z9889 Other specified postprocedural states: Secondary | ICD-10-CM

## 2023-10-07 DIAGNOSIS — E785 Hyperlipidemia, unspecified: Secondary | ICD-10-CM

## 2023-10-07 DIAGNOSIS — I9789 Other postprocedural complications and disorders of the circulatory system, not elsewhere classified: Secondary | ICD-10-CM | POA: Diagnosis not present

## 2023-10-07 DIAGNOSIS — I4891 Unspecified atrial fibrillation: Secondary | ICD-10-CM

## 2023-10-07 DIAGNOSIS — I25118 Atherosclerotic heart disease of native coronary artery with other forms of angina pectoris: Secondary | ICD-10-CM

## 2023-10-07 DIAGNOSIS — D6859 Other primary thrombophilia: Secondary | ICD-10-CM

## 2023-10-07 DIAGNOSIS — Z951 Presence of aortocoronary bypass graft: Secondary | ICD-10-CM | POA: Diagnosis not present

## 2023-10-07 DIAGNOSIS — I77819 Aortic ectasia, unspecified site: Secondary | ICD-10-CM

## 2023-10-07 MED ORDER — METOPROLOL TARTRATE 25 MG PO TABS: 12.5000 mg | ORAL_TABLET | Freq: Two times a day (BID) | ORAL | Status: DC

## 2023-10-07 NOTE — Patient Instructions (Signed)
 Medication Instructions:   DECREASE Lopressor  one half (0.5) tablet by mouth ( 12.5 mg) twice daily.   *If you need a refill on your cardiac medications before your next appointment, please call your pharmacy*  Lab Work:  None ordered.  If you have labs (blood work) drawn today and your tests are completely normal, you will receive your results only by: MyChart Message (if you have MyChart) OR A paper copy in the mail If you have any lab test that is abnormal or we need to change your treatment, we will call you to review the results.  Testing/Procedures:  None ordered.  Follow-Up: At [facility], you and your health needs are our priority.  As part of our continuing mission to provide you with exceptional heart care, our providers are all part of one team.  This team includes your primary Cardiologist (physician) and Advanced Practice Providers or APPs (Physician Assistants and Nurse Practitioners) who all work together to provide you with the care you need, when you need it.  Your next appointment:   3 month(s)  Provider:   Shelda Bruckner, MD    We recommend signing up for the patient portal called MyChart.  Sign up information is provided on this After Visit Summary.  MyChart is used to connect with patients for Virtual Visits (Telemedicine).  Patients are able to view lab/test results, encounter notes, upcoming appointments, etc.  Non-urgent messages can be sent to your provider as well.   To learn more about what you can do with MyChart, go to [email or web address].

## 2023-10-24 ENCOUNTER — Other Ambulatory Visit: Payer: Self-pay | Admitting: Thoracic Surgery (Cardiothoracic Vascular Surgery)

## 2023-10-24 DIAGNOSIS — Z951 Presence of aortocoronary bypass graft: Secondary | ICD-10-CM

## 2023-10-24 NOTE — Progress Notes (Unsigned)
 [address] [place] [address]             [phone]       HPI: Allen Tapia is a 68 year old male medical history of hypertension, CAD, hyperlipidemia, abdominal aortic aneurysm, asthma, multiple sclerosis, GERD, prediabetes, and depression returns for routine postoperative follow-up having undergone CABG X 2.  LIMA LAD, RSVG diagonal, Endoscopic greater saphenous vein harvest on the right on 09/16/2023 with Dr. Shyrl. The patient's early postoperative recovery while in the hospital was notable for developing paroxysmal atrial fibrillation and he was started on oral amiodarone . He had another episode of rapid paroxysmal atrial fibrillation and was treated with amiodarone  bolus with conversion to normal sinus rhythm. He was started on eliquis  for stroke prevention due to the atrial fibrillation. He was medically stable and discharged home on 09/21/2023.  Since hospital discharge the patient reports he has been doing well.  He reports some incisional chest pain specifically around his left breast.  This is slowly been improving with time.  He finds that the incision on his chest is still sensitive and top portion is still scabbed.  He has not needed to take narcotic pain medications.  He has been using ibuprofen and Tylenol  as needed for pain.   Current Outpatient Medications  Medication Sig Dispense Refill   acetaminophen  (TYLENOL ) 500 MG tablet Take 2 tablets (1,000 mg total) by mouth every 6 (six) hours as needed for mild pain. 60 tablet 0   amiodarone  (PACERONE ) 200 MG tablet Take 2 tablets (400 mg total) by mouth 2 (two) times daily. For 10 days then take 1 tablet (200mg ) TWICE daily for 14 days then take 1 tablet (200mg ) ONCE daily thereafter. 60 tablet 2   amphetamine -dextroamphetamine (ADDERALL XR) 25 MG 24 hr capsule Take 25 mg by mouth daily as needed (focus).     apixaban  (ELIQUIS ) 5 MG TABS tablet Take 1 tablet (5 mg total) by mouth 2 (two) times  daily. 60 tablet 1   aspirin  EC 81 MG tablet Take 81 mg by mouth daily. Swallow whole.     baclofen  (LIORESAL ) 20 MG tablet Take 30 mg by mouth 3 (three) times daily.     BREZTRI  AEROSPHERE 160-9-4.8 MCG/ACT AERO USE 2 INHALATIONS IN THE MORNING AND AT BEDTIME 32.1 g 3   buPROPion  (WELLBUTRIN  XL) 150 MG 24 hr tablet Take 150 mg by mouth daily.     gabapentin  (NEURONTIN ) 300 MG capsule Take 1-2 capsules (300-600 mg total) by mouth 2 (two) times daily. 600 mg in am and 300 mg in afternoon (Patient taking differently: Take 600 mg by mouth See admin instructions. Take 600 by mouth in the morning and 600 in the late evening) 60 capsule 0   metoprolol  tartrate (LOPRESSOR ) 25 MG tablet Take 0.5 tablets (12.5 mg total) by mouth 2 (two) times daily.     montelukast  (SINGULAIR ) 10 MG tablet TAKE 1 TABLET AT BEDTIME 90 tablet 3   Multiple Vitamins-Minerals (MULTIVITAMIN WITH MINERALS) tablet Take 1 tablet by mouth daily.     Ofatumumab (KESIMPTA) 20 MG/0.4ML SOAJ Inject 20 mg into the skin every 30 (thirty) days. 1st day of every month     pantoprazole  (PROTONIX ) 20 MG tablet Take 20 mg by mouth at bedtime.     Polyethyl Glycol-Propyl Glycol (SYSTANE) 0.4-0.3 % SOLN Place 1 drop into both eyes 4 (four) times daily as needed (dry eyes).     rosuvastatin  (CRESTOR ) 40  MG tablet Take 40 mg by mouth at bedtime.     Spacer/Aero-Holding Chambers (AEROCHAMBER MV) inhaler Use as instructed 1 each 0   tamsulosin  (FLOMAX ) 0.4 MG CAPS capsule TAKE 1 CAPSULE DAILY (Patient taking differently: Take 0.4 mg by mouth at bedtime.) 90 capsule 3   traZODone  (DESYREL ) 100 MG tablet Take 100 mg by mouth at bedtime.     VENTOLIN  HFA 108 (90 Base) MCG/ACT inhaler Inhale 2 puffs into the lungs every 4 (four) hours as needed for wheezing or shortness of breath. 3 each 4   Vibegron  (GEMTESA ) 75 MG TABS Take 1 tablet (75 mg total) by mouth daily. 90 tablet 3   VITAMIN D  PO Take 1 capsule by mouth daily.     No current  facility-administered medications for this visit.   Vitals:   10/25/23 1139  BP: (!) 90/57  Pulse: 61  Resp: 20  SpO2: 94%   Review of Systems  Respiratory: Negative.  Negative for cough and shortness of breath.   Cardiovascular: Negative.  Negative for chest pain and leg swelling.    Physical Exam Constitutional:      Appearance: Normal appearance.  HENT:     Head: Normocephalic and atraumatic.  Cardiovascular:     Rate and Rhythm: Normal rate and regular rhythm.     Heart sounds: Normal heart sounds, S1 normal and S2 normal.  Pulmonary:     Effort: Pulmonary effort is normal.     Breath sounds: Normal breath sounds.  Musculoskeletal:     Right lower leg: No swelling.     Left lower leg: No swelling.     Right ankle: No swelling.     Left ankle: No swelling.  Skin:    General: Skin is warm and dry.      Neurological:     General: No focal deficit present.     Mental Status: He is alert and oriented to person, place, and time.      Diagnostic Tests: CLINICAL DATA:  Status post coronary artery bypass graft.   EXAM: CHEST - 2 VIEW   COMPARISON:  September 18, 2023.   FINDINGS: Stable cardiomediastinal silhouette. Sternotomy wires are noted. No acute pulmonary disease is noted. Bony thorax is unremarkable.   IMPRESSION: No active cardiopulmonary disease.     Electronically Signed   By: Allen Tapia M.D.   On: 10/25/2023 11:41   Impression/Plan: S/P CABG x 2 We reviewed today's chest x ray. We discussed driving he is able to start driving and first time he drives should be a short distance in the daytime.  He can slowly increase distance every day after that.  He would like to participate in cardiac rehab and is cleared from a surgical standpoint at this time.  They have already reached out and scheduled his orientation.  He should continue sternal precautions until a full 8 weeks from surgery discussed that he can slowly increase exercise and activity as  tolerated.  He has been seen by cardiology.  They reduced his metoprolol  to 12.5 mg BID since he was experiencing fatigue. He is to continue amiodarone  and eliquis  for atrial fibrillation.  He is to continue follow up with cardiology.    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  Aneurysm measured 4.3 cm and aortic root measured 4.5 cm. We discussed the natural history and and risk factors for growth of ascending  aortic aneurysms. Discussed recommendations to minimize the risk of further expansion or dissection including careful blood pressure control, avoidance of contact sports and heavy lifting, attention to lipid management.  We covered the importance of continued smoking cessation.  The patient does not yet meet surgical criteria of >5.5cm. The patient is aware of signs and symptoms of aortic dissection and when to present to the emergency department  Appointment made for follow-up in 1 year with CTA of the chest.     Manuelita CHRISTELLA Rough, PA-C [facility] [phone]

## 2023-10-25 ENCOUNTER — Ambulatory Visit (HOSPITAL_COMMUNITY)
Admission: RE | Admit: 2023-10-25 | Discharge: 2023-10-25 | Disposition: A | Discharge status: Home / Self Care | Source: Ambulatory Visit | Attending: Cardiology | Admitting: Cardiology

## 2023-10-25 ENCOUNTER — Ambulatory Visit: Payer: Self-pay

## 2023-10-25 VITALS — BP 90/57 | HR 61 | Resp 20 | Ht 72.0 in | Wt 202.1 lb

## 2023-10-25 DIAGNOSIS — I7121 Aneurysm of the ascending aorta, without rupture: Secondary | ICD-10-CM

## 2023-10-25 DIAGNOSIS — Z951 Presence of aortocoronary bypass graft: Secondary | ICD-10-CM | POA: Insufficient documentation

## 2023-10-25 NOTE — Patient Instructions (Signed)
 You may continue to gradually increase your physical activity as tolerated.  Refrain from any heavy lifting or strenuous use of your arms and shoulders until at least 8 weeks from the time of your surgery, and avoid activities that cause increased pain in your chest on the side of your surgical incision.  Otherwise you may continue to increase activities without any particular limitations.  Increase the intensity and duration of physical activity gradually.  You may return to driving an automobile as long as you are no longer requiring oral narcotic pain relievers during the daytime.  It would be wise to start driving only short distances during the daylight and gradually increase from there as you feel comfortable.  Risk Modification in those with ascending thoracic aortic aneurysm:   Continue control of blood pressure (prefer BP 130/80 or less)   2. Avoid fluoroquinolone antibiotics (I.e Ciprofloxacin, Avelox, Levofloxacin , Ofloxacin)   3.  Use of statin (to decrease cardiovascular risk)   4.  Exercise and activity limitations is individualized, but in general, contact sports are to be avoided and one should avoid heavy lifting (defined as half of ideal body weight) and exercises involving sustained Valsalva maneuver.   5.  Follow-up in one year with CT chest.  OK to use a non-contrast CT if you have had a recent study for surveillance of the lung nodule.

## 2023-10-28 ENCOUNTER — Encounter (HOSPITAL_COMMUNITY): Payer: Self-pay

## 2023-10-28 ENCOUNTER — Ambulatory Visit (HOSPITAL_COMMUNITY): Admission: RE | Admit: 2023-10-28 | Source: Ambulatory Visit

## 2023-10-28 ENCOUNTER — Other Ambulatory Visit: Payer: Self-pay

## 2023-10-28 ENCOUNTER — Encounter: Attending: Cardiology

## 2023-10-28 DIAGNOSIS — Z951 Presence of aortocoronary bypass graft: Secondary | ICD-10-CM | POA: Insufficient documentation

## 2023-10-28 NOTE — Progress Notes (Signed)
 Initial phone call completed. Diagnosis can be found in [facility] 09/16/2023. EP Orientation scheduled for Wednesday, October 30, 2023, @ 0900.

## 2023-10-30 ENCOUNTER — Encounter

## 2023-10-30 ENCOUNTER — Other Ambulatory Visit: Payer: Self-pay

## 2023-10-30 VITALS — Ht 72.6 in | Wt 202.5 lb

## 2023-10-30 DIAGNOSIS — Z951 Presence of aortocoronary bypass graft: Secondary | ICD-10-CM

## 2023-10-30 MED ORDER — APIXABAN 5 MG PO TABS: 5.0000 mg | ORAL_TABLET | Freq: Two times a day (BID) | ORAL | 0 refills | Status: DC

## 2023-10-30 MED ORDER — METOPROLOL TARTRATE 25 MG PO TABS: 12.5000 mg | ORAL_TABLET | Freq: Two times a day (BID) | ORAL | 0 refills | Status: DC

## 2023-10-30 MED ORDER — AMIODARONE HCL 200 MG PO TABS
200.0000 mg | ORAL_TABLET | Freq: Every day | ORAL | 0 refills | Status: DC
Start: 2023-10-30 — End: 2024-01-13

## 2023-10-30 NOTE — Progress Notes (Signed)
 Cardiac Individual Treatment Plan  Patient Details  Name: Allen Tapia MRN: [id number] Date of Birth: 11-27-1955 Referring Provider:   Flowsheet Row Cardiac Rehab from 10/30/2023 in [facility] Cardiac and Pulmonary Rehab  Referring Provider Dr. Shelda Bruckner    Initial Encounter Date:  Flowsheet Row Cardiac Rehab from 10/30/2023 in [facility] Cardiac and Pulmonary Rehab  Date 10/30/23    Visit Diagnosis: S/P CABG x 2  Patient's Home Medications on Admission:  Current Outpatient Medications:    acetaminophen  (TYLENOL ) 500 MG tablet, Take 2 tablets (1,000 mg total) by mouth every 6 (six) hours as needed for mild pain., Disp: 60 tablet, Rfl: 0   amiodarone  (PACERONE ) 200 MG tablet, Take 2 tablets (400 mg total) by mouth 2 (two) times daily. For 10 days then take 1 tablet (200mg ) TWICE daily for 14 days then take 1 tablet (200mg ) ONCE daily thereafter. (Patient taking differently: Take 400 mg by mouth daily. For 10 days then take 1 tablet (200mg ) TWICE daily for 14 days then take 1 tablet (200mg ) ONCE daily thereafter.), Disp: 60 tablet, Rfl: 2   amiodarone  (PACERONE ) 200 MG tablet, Take 1 tablet (200 mg total) by mouth daily., Disp: 90 tablet, Rfl: 0   amphetamine -dextroamphetamine (ADDERALL XR) 25 MG 24 hr capsule, Take 25 mg by mouth daily as needed (focus)., Disp: , Rfl:    apixaban  (ELIQUIS ) 5 MG TABS tablet, Take 1 tablet (5 mg total) by mouth 2 (two) times daily., Disp: 180 tablet, Rfl: 0   aspirin  EC 81 MG tablet, Take 81 mg by mouth daily. Swallow whole., Disp: , Rfl:    baclofen  (LIORESAL ) 20 MG tablet, Take 30 mg by mouth 3 (three) times daily., Disp: , Rfl:    BREZTRI  AEROSPHERE 160-9-4.8 MCG/ACT AERO, USE 2 INHALATIONS IN THE MORNING AND AT BEDTIME, Disp: 32.1 g, Rfl: 3   buPROPion  (WELLBUTRIN  XL) 150 MG 24 hr tablet, Take 150 mg by mouth daily., Disp: , Rfl:    gabapentin  (NEURONTIN ) 300 MG capsule, Take 1-2 capsules (300-600 mg total) by mouth 2 (two) times daily. 600 mg in am and  300 mg in afternoon, Disp: 60 capsule, Rfl: 0   metoprolol  tartrate (LOPRESSOR ) 25 MG tablet, Take 0.5 tablets (12.5 mg total) by mouth 2 (two) times daily., Disp: 90 tablet, Rfl: 0   montelukast  (SINGULAIR ) 10 MG tablet, TAKE 1 TABLET AT BEDTIME, Disp: 90 tablet, Rfl: 3   Multiple Vitamins-Minerals (MULTIVITAMIN WITH MINERALS) tablet, Take 1 tablet by mouth daily., Disp: , Rfl:    Ofatumumab (KESIMPTA) 20 MG/0.4ML SOAJ, Inject 20 mg into the skin every 30 (thirty) days. 1st day of every month, Disp: , Rfl:    pantoprazole  (PROTONIX ) 20 MG tablet, Take 20 mg by mouth at bedtime., Disp: , Rfl:    Polyethyl Glycol-Propyl Glycol (SYSTANE) 0.4-0.3 % SOLN, Place 1 drop into both eyes 4 (four) times daily as needed (dry eyes)., Disp: , Rfl:    rosuvastatin  (CRESTOR ) 40 MG tablet, Take 40 mg by mouth at bedtime., Disp: , Rfl:    Spacer/Aero-Holding Chambers (AEROCHAMBER MV) inhaler, Use as instructed, Disp: 1 each, Rfl: 0   tamsulosin  (FLOMAX ) 0.4 MG CAPS capsule, TAKE 1 CAPSULE DAILY, Disp: 90 capsule, Rfl: 3   traZODone  (DESYREL ) 100 MG tablet, Take 100 mg by mouth at bedtime., Disp: , Rfl:    VENTOLIN  HFA 108 (90 Base) MCG/ACT inhaler, Inhale 2 puffs into the lungs every 4 (four) hours as needed for wheezing or shortness of breath., Disp: 3 each, Rfl: 4  Vibegron  (GEMTESA ) 75 MG TABS, Take 1 tablet (75 mg total) by mouth daily., Disp: 90 tablet, Rfl: 3   VITAMIN D  PO, Take 1 capsule by mouth daily., Disp: , Rfl:   Past Medical History: Past Medical History:  Diagnosis Date   Anginal pain (HCC)    atypical chest pain cardiac cath performed at that time   Arthritis    Asthma    Atypical chest pain    Barrett's esophagus    Bilateral cataracts    BPH (benign prostatic hyperplasia)    Cancer (HCC)    skin ca in face and hand   Chicken pox    Chronic constipation    no current problem per pt 09/12/23   COPD (chronic obstructive pulmonary disease) (HCC)    Coronary artery disease 07/14/2007    a.) LHC 07/14/2007: 40% pRCA-1, 40% pRCA-2, 40% dRCA, 20% RPL1, 30% LM, 20% mLCx-1, 40% mLCx-2, 20% OM3-1, 20% OM3-2, 20% pLAD, 40% mLAD-1, 70% mLAD-2, 40% mLAD-3, 30% dLAD, 40% D1, 40% D2 --> med mgmt.   Depression    Foreign body (FB) in soft tissue    Former tobacco use    GERD (gastroesophageal reflux disease)    History of 2019 novel coronavirus disease (COVID-19) 04/07/2019   a.) tested (+) at [facility] 04/16/2019, 05/30/2021; b.) 2 other (+) tests at home (dates unknown)   History of kidney stones    Hyperlipidemia    Hyperplastic colon polyp    Hypertension    Infrarenal abdominal aortic aneurysm (AAA) without rupture (HCC)    a.) MR lumbar spine 12/30/2020: infrarenal saccular AAA measuring 2.2 cm; b.) CTA A/P 01/05/2021: measured 2.6 cm; c.) s/p EVAR 03/03/2021   Ischemic heart disease 12/20/2010   Long term current use of immunosuppressive drug    a.) on ofatumumab for MS   MS (multiple sclerosis) (HCC)    a.) on ofatumumab   Neuromuscular disorder (HCC)    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 - takes gabapentin    Pneumonia    x several   Pre-diabetes    Shingles    Tubular adenoma of colon 08/23/2016    Tobacco Use: Social History   Tobacco Use  Smoking Status Former   Current packs/day: 0.00   Average packs/day: 1 pack/day for 32.5 years (32.5 ttl pk-yrs)   Types: Cigarettes   Start date: 72   Quit date: 09/23/2006   Years since quitting: 17.1  Smokeless Tobacco Never    Labs: Review Flowsheet  More data may exist      Latest Ref Rng & Units 02/28/2021 06/06/2022 09/12/2023 09/16/2023 09/17/2023  Labs for ITP Cardiac and Pulmonary Rehab  Cholestrol 0 - 200 mg/dL - - - - 56   LDL (calc) 0 - 99 mg/dL - - - - 19   HDL-C >59 mg/dL - - - - 29   Trlycerides <150 mg/dL - - - - 40   Hemoglobin A1c 4.8 - 5.6 % - 6.1  5.8  - 5.9   PH, Arterial 7.35 - 7.45 7.383  - - 7.309  7.284  7.358  7.345  7.406  7.394  7.335  -  PCO2 arterial 32 - 48 mmHg 44.2  - - 40.9  48.1  41.1  42.9   39.4  39.5  42.9  -  Bicarbonate 20.0 - 28.0 mmol/L 25.7  - - 20.6  23.0  23.1  23.4  24.7  24.4  24.2  22.9  -  TCO2 22 - 32 mmol/L - - -  22  24  24  21  25  26  24  26  25  24  22  24  26   -  Acid-base deficit 0.0 - 2.0 mmol/L - - - 5.0  4.0  2.0  2.0  1.0  1.0  3.0  -  O2 Saturation % 97.1  - - 96  100  99  95  100  76  100  100  -    Details       Multiple values from one day are sorted in reverse-chronological order          Exercise Target Goals: Exercise Program Goal: Individual exercise prescription set using results from initial 6 min walk test and THRR while considering  patient's activity barriers and safety.   Exercise Prescription Goal: Initial exercise prescription builds to 30-45 minutes a day of aerobic activity, 2-3 days per week.  Home exercise guidelines will be given to patient during program as part of exercise prescription that the participant will acknowledge.   Education: Aerobic Exercise: - Group verbal and visual presentation on the components of exercise prescription. Introduces F.I.T.T principle from ACSM for exercise prescriptions.  Reviews F.I.T.T. principles of aerobic exercise including progression. Written material given at graduation.   Education: Resistance Exercise: - Group verbal and visual presentation on the components of exercise prescription. Introduces F.I.T.T principle from ACSM for exercise prescriptions  Reviews F.I.T.T. principles of resistance exercise including progression. Written material given at graduation.    Education: Exercise & Equipment Safety: - Individual verbal instruction and demonstration of equipment use and safety with use of the equipment. Flowsheet Row Cardiac Rehab from 10/30/2023 in [facility] Cardiac and Pulmonary Rehab  Date 10/30/23  Educator [facility]  Instruction Review Code 1- Verbalizes Understanding    Education: Exercise Physiology & General Exercise Guidelines: - Group verbal and written instruction with models to  review the exercise physiology of the cardiovascular system and associated critical values. Provides general exercise guidelines with specific guidelines to those with heart or lung disease.    Education: Flexibility, Balance, Mind/Body Relaxation: - Group verbal and visual presentation with interactive activity on the components of exercise prescription. Introduces F.I.T.T principle from ACSM for exercise prescriptions. Reviews F.I.T.T. principles of flexibility and balance exercise training including progression. Also discusses the mind body connection.  Reviews various relaxation techniques to help reduce and manage stress (i.e. Deep breathing, progressive muscle relaxation, and visualization). Balance handout provided to take home. Written material given at graduation.   Activity Barriers & Risk Stratification:  Activity Barriers & Cardiac Risk Stratification - 10/30/23 1059       Activity Barriers & Cardiac Risk Stratification   Activity Barriers Arthritis;Right Knee Replacement;Left Knee Replacement;Joint Problems;Other (comment)    Comments hx of arthritis in R hand, elbow; also has MS but generally has no pain    Cardiac Risk Stratification High          6 Minute Walk:  6 Minute Walk     Row Name 10/30/23 1057         6 Minute Walk   Phase Initial     Distance 1395 feet     Walk Time 6 minutes     # of Rest Breaks 0     MPH 2.64     METS 3.48     RPE 11     Perceived Dyspnea  1     VO2 Peak 12.2     Symptoms No     Resting  HR 59 bpm     Resting BP 124/62     Resting Oxygen Saturation  93 %     Exercise Oxygen Saturation  during 6 min walk 97 %     Max Ex. HR 115 bpm     Max Ex. BP 136/62     2 Minute Post BP 128/64        Oxygen Initial Assessment:   Oxygen Re-Evaluation:   Oxygen Discharge (Final Oxygen Re-Evaluation):   Initial Exercise Prescription:  Initial Exercise Prescription - 10/30/23 1000       Date of Initial Exercise RX and Referring  Provider   Date 10/30/23    Referring Provider Dr. Shelda Bruckner      Oxygen   Maintain Oxygen Saturation 88% or higher      Treadmill   MPH 2.5    Grade 0    Minutes 15    METs 2.91      Recumbant Bike   Level 2    RPM 50    Watts 25    Minutes 15    METs 3.5      NuStep   Level 2    SPM 80    Minutes 15    METs 3.5      T5 Nustep   Level 2    SPM 80    Minutes 15    METs 3.5      Biostep-RELP   Level 2    SPM 50    Minutes 15    METs 3.5      Track   Laps 35    Minutes 15    METs 2.9      Prescription Details   Duration Progress to 30 minutes of continuous aerobic without signs/symptoms of physical distress      Intensity   THRR 40-80% of Max Heartrate 96-133    Ratings of Perceived Exertion 11-13    Perceived Dyspnea 0-4      Progression   Progression Continue to progress workloads to maintain intensity without signs/symptoms of physical distress.      Resistance Training   Training Prescription Yes    Weight 5lb    Reps 10-15          Perform Capillary Blood Glucose checks as needed.  Exercise Prescription Changes:   Exercise Prescription Changes     Row Name 10/30/23 1100             Response to Exercise   Blood Pressure (Admit) 124/62       Blood Pressure (Exercise) 136/62       Blood Pressure (Exit) 128/64       Heart Rate (Admit) 59 bpm       Heart Rate (Exercise) 115 bpm       Heart Rate (Exit) 60 bpm       Oxygen Saturation (Admit) 93 %       Oxygen Saturation (Exercise) 97 %       Oxygen Saturation (Exit) 97 %       Rating of Perceived Exertion (Exercise) 11       Perceived Dyspnea (Exercise) 1       Symptoms none       Comments results          Exercise Comments:   Exercise Goals and Review:   Exercise Goals     Row Name 10/30/23 1104             Exercise  Goals   Increase Physical Activity Yes       Intervention Develop an individualized exercise prescription for aerobic and resistive  training based on initial evaluation findings, risk stratification, comorbidities and participant's personal goals.;Provide advice, education, support and counseling about physical activity/exercise needs.       Expected Outcomes Short Term: Attend rehab on a regular basis to increase amount of physical activity.;Long Term: Add in home exercise to make exercise part of routine and to increase amount of physical activity.;Long Term: Exercising regularly at least 3-5 days a week.       Increase Strength and Stamina Yes       Intervention Provide advice, education, support and counseling about physical activity/exercise needs.;Develop an individualized exercise prescription for aerobic and resistive training based on initial evaluation findings, risk stratification, comorbidities and participant's personal goals.       Expected Outcomes Short Term: Increase workloads from initial exercise prescription for resistance, speed, and METs.;Short Term: Perform resistance training exercises routinely during rehab and add in resistance training at home;Long Term: Improve cardiorespiratory fitness, muscular endurance and strength as measured by increased METs and functional capacity ( )       Able to understand and use rate of perceived exertion (RPE) scale Yes       Intervention Provide education and explanation on how to use RPE scale       Expected Outcomes Short Term: Able to use RPE daily in rehab to express subjective intensity level;Long Term:  Able to use RPE to guide intensity level when exercising independently       Able to understand and use Dyspnea scale Yes       Intervention Provide education and explanation on how to use Dyspnea scale       Expected Outcomes Short Term: Able to use Dyspnea scale daily in rehab to express subjective sense of shortness of breath during exertion;Long Term: Able to use Dyspnea scale to guide intensity level when exercising independently       Knowledge and understanding  of Target Heart Rate Range (THRR) Yes       Intervention Provide education and explanation of THRR including how the numbers were predicted and where they are located for reference       Expected Outcomes Short Term: Able to state/look up THRR;Short Term: Able to use daily as guideline for intensity in rehab;Long Term: Able to use THRR to govern intensity when exercising independently       Able to check pulse independently Yes       Intervention Provide education and demonstration on how to check pulse in carotid and radial arteries.;Review the importance of being able to check your own pulse for safety during independent exercise       Expected Outcomes Short Term: Able to explain why pulse checking is important during independent exercise;Long Term: Able to check pulse independently and accurately       Understanding of Exercise Prescription Yes       Intervention Provide education, explanation, and written materials on patient's individual exercise prescription       Expected Outcomes Short Term: Able to explain program exercise prescription;Long Term: Able to explain home exercise prescription to exercise independently          Exercise Goals Re-Evaluation :   Discharge Exercise Prescription (Final Exercise Prescription Changes):  Exercise Prescription Changes - 10/30/23 1100       Response to Exercise   Blood Pressure (Admit) 124/62    Blood Pressure (  Exercise) 136/62    Blood Pressure (Exit) 128/64    Heart Rate (Admit) 59 bpm    Heart Rate (Exercise) 115 bpm    Heart Rate (Exit) 60 bpm    Oxygen Saturation (Admit) 93 %    Oxygen Saturation (Exercise) 97 %    Oxygen Saturation (Exit) 97 %    Rating of Perceived Exertion (Exercise) 11    Perceived Dyspnea (Exercise) 1    Symptoms none    Comments results          Nutrition:  Target Goals: Understanding of nutrition guidelines, daily intake of sodium 1500mg , cholesterol 200mg , calories 30% from fat and 7% or less  from saturated fats, daily to have 5 or more servings of fruits and vegetables.  Education: All About Nutrition: -Group instruction provided by verbal, written material, interactive activities, discussions, models, and posters to present general guidelines for heart healthy nutrition including fat, fiber, MyPlate, the role of sodium in heart healthy nutrition, utilization of the nutrition label, and utilization of this knowledge for meal planning. Follow up email sent as well. Written material given at graduation. Flowsheet Row Cardiac Rehab from 10/30/2023 in [facility] Cardiac and Pulmonary Rehab  Education need identified 10/30/23    Biometrics:  Pre Biometrics - 10/30/23 1104       Pre Biometrics   Height 6' 0.6 (1.844 m)    Weight 202 lb 8 oz (91.9 kg)    Waist Circumference 41 inches    Hip Circumference 41 inches    Waist to Hip Ratio 1 %    BMI (Calculated) 27.01    Single Leg Stand 14.9 seconds           Nutrition Therapy Plan and Nutrition Goals:   Nutrition Assessments:  MEDIFICTS Score Key: >=70 Need to make dietary changes  40-70 Heart Healthy Diet <= 40 Therapeutic Level Cholesterol Diet  Flowsheet Row Cardiac Rehab from 10/28/2023 in [facility] Cardiac and Pulmonary Rehab  Picture Your Plate Total Score on Admission 53   Picture Your Plate Scores: <59 Unhealthy dietary pattern with much room for improvement. 41-50 Dietary pattern unlikely to meet recommendations for good health and room for improvement. 51-60 More healthful dietary pattern, with some room for improvement.  >60 Healthy dietary pattern, although there may be some specific behaviors that could be improved.    Nutrition Goals Re-Evaluation:   Nutrition Goals Discharge (Final Nutrition Goals Re-Evaluation):   Psychosocial: Target Goals: Acknowledge presence or absence of significant depression and/or stress, maximize coping skills, provide positive support system. Participant is able to verbalize types  and ability to use techniques and skills needed for reducing stress and depression.   Education: Stress, Anxiety, and Depression - Group verbal and visual presentation to define topics covered.  Reviews how body is impacted by stress, anxiety, and depression.  Also discusses healthy ways to reduce stress and to treat/manage anxiety and depression.  Written material given at graduation.   Education: Sleep Hygiene -Provides group verbal and written instruction about how sleep can affect your health.  Define sleep hygiene, discuss sleep cycles and impact of sleep habits. Review good sleep hygiene tips.    Initial Review & Psychosocial Screening:  Initial Psych Review & Screening - 10/28/23 1412       Initial Review   Current issues with History of Depression;Current Stress Concerns    Comments Patient stated he has a good support system and currently takes wellbutrin . 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 He stated his wife has been very attentive to his recovery and health. Patient stated that he is now able to drive and that is helping him get back to normal. 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 Overall, patient stated he is doing well and does not have any thoughts or made any attempts to harm himself.      Family Dynamics   Good Support System? Yes    Comments wife is very supportive      Barriers   Psychosocial barriers to participate in program The patient should benefit from training in stress management and relaxation.   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         Quality of Life Scores:   Quality of Life - 10/28/23 1433       Quality of Life   Select Quality of Life      Quality of Life Scores   Health/Function Pre 22.83 %    Socioeconomic Pre 20.3 %     Psych/Spiritual Pre 24.69 %    Family Pre 23.57 %    GLOBAL Pre 26.4 %         Scores of 19 and below usually indicate a poorer quality of life in these areas.  A difference of  2-3 points is a clinically meaningful difference.  A difference of 2-3 points in the total score of the Quality of Life Index has been associated with significant improvement in overall quality of life, self-image, physical symptoms, and general health in studies assessing change in quality of life.  PHQ-9: Review Flowsheet       10/30/2023  Depression screen PHQ 2/9  Decreased Interest 1  Down, Depressed, Hopeless 1  PHQ - 2 Score 2  Altered sleeping 1  Tired, decreased energy 3  Change in appetite 2  Feeling bad or failure about yourself  0  Trouble concentrating 1  Moving slowly or fidgety/restless 1  Suicidal thoughts 0  PHQ-9 Score 10  Difficult doing work/chores Not difficult at all   Interpretation of Total Score  Total Score Depression Severity:  1-4 = Minimal depression, 5-9 = Mild depression, 10-14 = Moderate depression, 15-19 = Moderately severe depression, 20-27 = Severe depression   Psychosocial Evaluation and Intervention:  Psychosocial Evaluation - 10/28/23 1418       Psychosocial Evaluation & Interventions   Interventions Stress management education;Relaxation education;Encouraged to exercise with the program and follow exercise prescription    Comments Patient stated he has a good support system and currently takes wellbutrin . 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 He stated his wife has been very attentive to his recovery and health. Patient stated that he is now able to drive and that is helping him get back to normal. 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 Overall, patient stated he is doing well and does not have any thoughts or made any attempts to harm himself.    Expected  Outcomes ST: Attend cardiac rehab for education and exercise; LT: develop and maintain positive self-care habits    Continue Psychosocial Services  Follow up required by staff  Psychosocial Re-Evaluation:   Psychosocial Discharge (Final Psychosocial Re-Evaluation):   Vocational Rehabilitation: Provide vocational rehab assistance to qualifying candidates.   Vocational Rehab Evaluation & Intervention:  Vocational Rehab - 10/28/23 1412       Initial Vocational Rehab Evaluation & Intervention   Assessment shows need for Vocational Rehabilitation No          Education: Education Goals: Education classes will be provided on a variety of topics geared toward better understanding of heart health and risk factor modification. Participant will state understanding/return demonstration of topics presented as noted by education test scores.  Learning Barriers/Preferences:  Learning Barriers/Preferences - 10/28/23 1411       Learning Barriers/Preferences   Learning Barriers None    Learning Preferences Individual Instruction;Group Instruction;Verbal Instruction;Video;Written Material;Computer/Internet;Audio          General Cardiac Education Topics:  AED/CPR: - Group verbal and written instruction with the use of models to demonstrate the basic use of the AED with the basic ABC's of resuscitation.   Anatomy and Cardiac Procedures: - Group verbal and visual presentation and models provide information about basic cardiac anatomy and function. Reviews the testing methods done to diagnose heart disease and the outcomes of the test results. Describes the treatment choices: Medical Management, Angioplasty, or Coronary Bypass Surgery for treating various heart conditions including Myocardial Infarction, Angina, Valve Disease, and Cardiac Arrhythmias.  Written material given at graduation.   Medication Safety: - Group verbal and visual instruction to review commonly prescribed  medications for heart and lung disease. Reviews the medication, class of the drug, and side effects. Includes the steps to properly store meds and maintain the prescription regimen.  Written material given at graduation.   Intimacy: - Group verbal instruction through game format to discuss how heart and lung disease can affect sexual intimacy. Written material given at graduation..   Know Your Numbers and Heart Failure: - Group verbal and visual instruction to discuss disease risk factors for cardiac and pulmonary disease and treatment options.  Reviews associated critical values for Overweight/Obesity, Hypertension, Cholesterol, and Diabetes.  Discusses basics of heart failure: signs/symptoms and treatments.  Introduces Heart Failure Zone chart for action plan for heart failure.  Written material given at graduation.   Infection Prevention: - Provides verbal and written material to individual with discussion of infection control including proper hand washing and proper equipment cleaning during exercise session. Flowsheet Row Cardiac Rehab from 10/30/2023 in [facility] Cardiac [facility] Re[facility]  Date 10/30/23  Educator [facility]  Instruction Review Code 1- Verbalizes Understanding    Falls Prevention: - Provides verbal and written material to individual with discussion of falls prevention and safety. Flowsheet Row Cardiac Rehab from 10/30/2023 in [facility] Cardiac and Pulmonary Rehab  Date 10/30/23  Educator [facility]  Instruction Review Code 1- Verbalizes Understanding    Other: -Provides group and verbal instruction on various topics (see comments)   Knowledge Questionnaire Score:  Knowledge Questionnaire Score - 10/28/23 1432       Knowledge Questionnaire Score   Pre Score 25/26          Core Components/Risk Factors/Patient Goals at Admission:  Personal Goals and Risk Factors at Admission - 10/28/23 1410       Core Components/Risk Factors/Patient Goals on Admission    Weight Management Weight  Loss   current weight 196, would like to weigh 180; Also interested in reducing dose of statin as was recently increased from 20mg  to 40mg  post CABG   Hypertension Yes  Intervention Provide education on lifestyle modifcations including regular physical activity/exercise, weight management, moderate sodium restriction and increased consumption of fresh fruit, vegetables, and low fat dairy, alcohol  moderation, and smoking cessation.    Expected Outcomes Short Term: Continued assessment and intervention until BP is < 140/54mm HG in hypertensive participants. < 130/30mm HG in hypertensive participants with diabetes, heart failure or chronic kidney disease.;Long Term: Maintenance of blood pressure at goal levels.    Lipids Yes    Intervention Provide education and support for participant on nutrition & aerobic/resistive exercise along with prescribed medications to achieve LDL 70mg , HDL >40mg .    Expected Outcomes Short Term: Participant states understanding of desired cholesterol values and is compliant with medications prescribed. Participant is following exercise prescription and nutrition guidelines.;Long Term: Cholesterol controlled with medications as prescribed, with individualized exercise RX and with personalized nutrition plan. Value goals: LDL < 70mg , HDL > 40 mg.    Stress Yes    Intervention Offer individual and/or small group education and counseling on adjustment to heart disease, stress management and health-related lifestyle change. Teach and support self-help strategies.;Refer participants experiencing significant psychosocial distress to appropriate mental health specialists for further evaluation and treatment. When possible, include family members and significant others in education/counseling sessions.    Expected Outcomes Short Term: Participant demonstrates changes in health-related behavior, relaxation and other stress management skills, ability to obtain effective social support, and  compliance with psychotropic medications if prescribed.;Long Term: Emotional wellbeing is indicated by absence of clinically significant psychosocial distress or social isolation.          Education:Diabetes - Individual verbal and written instruction to review signs/symptoms of diabetes, desired ranges of glucose level fasting, after meals and with exercise. Acknowledge that pre and post exercise glucose checks will be done for 3 sessions at entry of program.   Core Components/Risk Factors/Patient Goals Review:    Core Components/Risk Factors/Patient Goals at Discharge (Final Review):    ITP Comments:  ITP Comments     Row Name 10/28/23 1424 10/30/23 1056         ITP Comments Initial phone call completed. Diagnosis can be found in [facility] 09/16/2023. EP Orientation scheduled for Wednesday, October 30, 2023, @ 0900. Completed and gym orientation for cardiac rehab. Initial ITP created and sent for review to Dr. Oneil Pinal, Medical Director.         Comments: Initial ITP

## 2023-10-30 NOTE — Progress Notes (Signed)
-  Refilled medications at this time for patient. He understands to follow up with cardiologist for next refills.  Original prescriptions sent to [facility] and he was [facility].

## 2023-10-30 NOTE — Patient Instructions (Signed)
 Patient Instructions  Patient Details  Name: Allen Tapia MRN: [id number] Date of Birth: 03/15/1956 Referring Provider:  Lonni Slain,*  Below are your personal goals for exercise, nutrition, and risk factors. Our goal is to help you stay on track towards obtaining and maintaining these goals. We will be discussing your progress on these goals with you throughout the program.  Initial Exercise Prescription:  Initial Exercise Prescription - 10/30/23 1000       Date of Initial Exercise RX and Referring Provider   Date 10/30/23    Referring Provider Dr. Slain Lonni      Oxygen   Maintain Oxygen Saturation 88% or higher      Treadmill   MPH 2.5    Grade 0    Minutes 15    METs 2.91      Recumbant Bike   Level 2    RPM 50    Watts 25    Minutes 15    METs 3.5      NuStep   Level 2    SPM 80    Minutes 15    METs 3.5      T5 Nustep   Level 2    SPM 80    Minutes 15    METs 3.5      Biostep-RELP   Level 2    SPM 50    Minutes 15    METs 3.5      Track   Laps 35    Minutes 15    METs 2.9      Prescription Details   Duration Progress to 30 minutes of continuous aerobic without signs/symptoms of physical distress      Intensity   THRR 40-80% of Max Heartrate 96-133    Ratings of Perceived Exertion 11-13    Perceived Dyspnea 0-4      Progression   Progression Continue to progress workloads to maintain intensity without signs/symptoms of physical distress.      Resistance Training   Training Prescription Yes    Weight 5lb    Reps 10-15          Exercise Goals: Frequency: Be able to perform aerobic exercise two to three times per week in program working toward 2-5 days per week of home exercise.  Intensity: Work with a perceived exertion of 11 (fairly light) - 15 (hard) while following your exercise prescription.  We will make changes to your prescription with you as you progress through the program.   Duration: Be able to do  30 to 45 minutes of continuous aerobic exercise in addition to a 5 minute warm-up and a 5 minute cool-down routine.   Nutrition Goals: Your personal nutrition goals will be established when you do your nutrition analysis with the dietician.  The following are general nutrition guidelines to follow: Cholesterol < 200mg /day Sodium < 1500mg /day Fiber: Men over 50 yrs - 30 grams per day  Personal Goals:  Personal Goals and Risk Factors at Admission - 10/28/23 1410       Core Components/Risk Factors/Patient Goals on Admission    Weight Management Weight Loss   current weight 196, would like to weigh 180; Also interested in reducing dose of statin as 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  Expected Outcomes Short Term: Continued assessment and intervention until BP is < 140/51mm HG in hypertensive participants. < 130/60mm HG in hypertensive participants with diabetes, heart failure or chronic kidney disease.;Long Term: Maintenance of blood pressure at goal levels.    Lipids Yes    Intervention Provide education and support for participant on nutrition & aerobic/resistive exercise along with prescribed medications to achieve LDL 70mg , HDL >40mg .    Expected Outcomes Short Term: Participant states understanding of desired cholesterol values and is compliant with medications prescribed. Participant is following exercise prescription and nutrition guidelines.;Long Term: Cholesterol controlled with medications as prescribed, with individualized exercise RX and with personalized nutrition plan. Value goals: LDL < 70mg , HDL > 40 mg.    Stress Yes    Intervention Offer individual and/or small group education  and counseling on adjustment to heart disease, stress management and health-related lifestyle change. Teach and support self-help strategies.;Refer participants experiencing significant psychosocial distress to appropriate mental health specialists for further evaluation and treatment. When possible, include family members and significant others in education/counseling sessions.    Expected Outcomes Short Term: Participant demonstrates changes in health-related behavior, relaxation and other stress management skills, ability to obtain effective social support, and compliance with psychotropic medications if prescribed.;Long Term: Emotional wellbeing is indicated by absence of clinically significant psychosocial distress or social isolation.          Exercise Goals and Review:  Exercise Goals     Row Name 10/30/23 1104             Exercise Goals   Increase Physical Activity Yes       Intervention Develop an individualized exercise prescription for aerobic and resistive training based on initial evaluation findings, risk stratification, comorbidities and participant's personal goals.;Provide advice, education, support and counseling about physical activity/exercise needs.       Expected Outcomes Short Term: Attend rehab on a regular basis to increase amount of physical activity.;Long Term: Add in home exercise to make exercise part of routine and to increase amount of physical activity.;Long Term: Exercising regularly at least 3-5 days a week.       Increase Strength and Stamina Yes       Intervention Provide advice, education, support and counseling about physical activity/exercise needs.;Develop an individualized exercise prescription for aerobic and resistive training based on initial evaluation findings, risk stratification, comorbidities and participant's personal goals.       Expected Outcomes Short Term: Increase workloads from initial exercise prescription for resistance, speed, and  METs.;Short Term: Perform resistance training exercises routinely during rehab and add in resistance training at home;Long Term: Improve cardiorespiratory fitness, muscular endurance and strength as measured by increased METs and functional capacity ( )       Able to understand and use rate of perceived exertion (RPE) scale Yes       Intervention Provide education and explanation on how to use RPE scale       Expected Outcomes Short Term: Able to use RPE daily in rehab to express subjective intensity level;Long Term:  Able to use RPE to guide intensity level when exercising independently       Able to understand and use Dyspnea scale Yes       Intervention Provide education and explanation on how to use Dyspnea scale       Expected Outcomes Short Term: Able to use Dyspnea scale daily in rehab to express subjective sense of shortness of breath during exertion;Long Term: Able to use Dyspnea scale to guide  intensity level when exercising independently       Knowledge and understanding of Target Heart Rate Range (THRR) Yes       Intervention Provide education and explanation of THRR including how the numbers were predicted and where they are located for reference       Expected Outcomes Short Term: Able to state/look up THRR;Short Term: Able to use daily as guideline for intensity in rehab;Long Term: Able to use THRR to govern intensity when exercising independently       Able to check pulse independently Yes       Intervention Provide education and demonstration on how to check pulse in carotid and radial arteries.;Review the importance of being able to check your own pulse for safety during independent exercise       Expected Outcomes Short Term: Able to explain why pulse checking is important during independent exercise;Long Term: Able to check pulse independently and accurately       Understanding of Exercise Prescription Yes       Intervention Provide education, explanation, and written materials  on patient's individual exercise prescription       Expected Outcomes Short Term: Able to explain program exercise prescription;Long Term: Able to explain home exercise prescription to exercise independently          Copy of goals given to participant.

## 2023-11-03 ENCOUNTER — Other Ambulatory Visit: Payer: Self-pay | Admitting: Physician Assistant

## 2023-11-08 ENCOUNTER — Encounter

## 2023-11-08 DIAGNOSIS — Z951 Presence of aortocoronary bypass graft: Secondary | ICD-10-CM | POA: Diagnosis not present

## 2023-11-08 NOTE — Progress Notes (Signed)
 Daily Session Note  Patient Details  Name: Allen Tapia MRN: [id number] Date of Birth: January 23, 1956 Referring Provider:   Flowsheet Row Cardiac Rehab from 10/30/2023 in [facility] Cardiac and Pulmonary Rehab  Referring Provider Dr. Shelda Bruckner    Encounter Date: 11/08/2023  Check In:  Session Check In - 11/08/23 1110       Check-In   Supervising physician immediately available to respond to emergencies See telemetry face sheet for immediately available ER MD    Location [facility]-Cardiac & Pulmonary Rehab    Staff Present Burnard Davenport RN,BSN,MPA;Joseph Hood RCP,RRT,BSRT;Maxon Conetta BS, Exercise Physiologist;Noah Tickle, BS, Exercise Physiologist    Virtual Visit No    Medication changes reported     No    Fall or balance concerns reported    No    Tobacco Cessation No Change    Warm-up and Cool-down Performed on first and last piece of equipment    Resistance Training Performed Yes    VAD Patient? No    PAD/SET Patient? No      Pain Assessment   Currently in Pain? No/denies             Social History   Tobacco Use  Smoking Status Former   Current packs/day: 0.00   Average packs/day: 1 pack/day for 32.5 years (32.5 ttl pk-yrs)   Types: Cigarettes   Start date: 57   Quit date: 09/23/2006   Years since quitting: 17.1  Smokeless Tobacco Never    Goals Met:  Independence with exercise equipment Exercise tolerated well No report of concerns or symptoms today Strength training completed today  Goals Unmet:  Not Applicable  Comments: First full day of exercise!  Patient was oriented to gym and equipment including functions, settings, policies, and procedures.  Patient's individual exercise prescription and treatment plan were reviewed.  All starting workloads were established based on the results of the 6 minute walk test done at initial orientation visit.  The plan for exercise progression was also introduced and progression will be customized based on  patient's performance and goals.    Dr. Oneil Pinal is Medical Director for [facility] Cardiac Rehabilitation.  Dr. Fuad Aleskerov is Medical Director for [facility] Pulmonary Rehabilitation.

## 2023-11-11 ENCOUNTER — Telehealth: Payer: Self-pay | Admitting: *Deleted

## 2023-11-11 ENCOUNTER — Encounter

## 2023-11-11 ENCOUNTER — Other Ambulatory Visit: Payer: Self-pay

## 2023-11-11 DIAGNOSIS — Z951 Presence of aortocoronary bypass graft: Secondary | ICD-10-CM

## 2023-11-11 DIAGNOSIS — T8141XA Infection following a procedure, superficial incisional surgical site, initial encounter: Secondary | ICD-10-CM

## 2023-11-11 MED ORDER — DOXYCYCLINE HYCLATE 100 MG PO CAPS: 100.0000 mg | ORAL_CAPSULE | Freq: Two times a day (BID) | ORAL | 0 refills | Status: AC

## 2023-11-11 NOTE — Progress Notes (Signed)
-  Patient called office to report that incision site has not fully healed since visit on 10/25/2023.  The area is now more inflamed and patient reports so purulent drainage from the site. -Doxycycline  BID for 10 days sent for patient and appointment made for 11/15/2023 for follow up

## 2023-11-11 NOTE — Telephone Encounter (Signed)
 Patient contacted the office stating the top of his incision began to drain pus. States he had a nickel size area of dried blood he attempted to clean. He states when he pressed on the area, white and yellow pus began to drain from under the scab. Denies fevers. Photos sent for review. No redness noted. Shared with PA, Manuelita Rough. Antibiotics sent to pharmacy. Follow up appt made for wound check. Patient aware.

## 2023-11-12 ENCOUNTER — Other Ambulatory Visit: Payer: Self-pay | Admitting: Physician Assistant

## 2023-11-13 ENCOUNTER — Encounter

## 2023-11-13 ENCOUNTER — Encounter: Payer: Self-pay | Admitting: Urology

## 2023-11-13 DIAGNOSIS — Z951 Presence of aortocoronary bypass graft: Secondary | ICD-10-CM

## 2023-11-13 NOTE — Progress Notes (Signed)
 Daily Session Note  Patient Details  Name: Allen Tapia MRN: [id number] Date of Birth: October 26, 1955 Referring Provider:   Flowsheet Row Cardiac Rehab from 10/30/2023 in [facility] Cardiac and Pulmonary Rehab  Referring Provider Dr. Shelda Bruckner    Encounter Date: 11/13/2023  Check In:  Session Check In - 11/13/23 1043       Check-In   Supervising physician immediately available to respond to emergencies See telemetry face sheet for immediately available ER MD    Location [facility]-Cardiac & Pulmonary Rehab    Staff Present Burnard Davenport RN,BSN,MPA;Joseph [facility] RCP,RRT,BSRT;Laura Cates RN,BSN;Noah Tickle, [place], Exercise Physiologist;Jason Elnor RDN,LDN    Virtual Visit No    Medication changes reported     No    Fall or balance concerns reported    No    Tobacco Cessation No Change    Warm-up and Cool-down Performed on first and last piece of equipment    Resistance Training Performed Yes    VAD Patient? No    PAD/SET Patient? No      Pain Assessment   Currently in Pain? No/denies             Social History   Tobacco Use  Smoking Status Former   Current packs/day: 0.00   Average packs/day: 1 pack/day for 32.5 years (32.5 ttl pk-yrs)   Types: Cigarettes   Start date: 55   Quit date: 09/23/2006   Years since quitting: 17.1  Smokeless Tobacco Never    Goals Met:  Independence with exercise equipment Exercise tolerated well No report of concerns or symptoms today Strength training completed today  Goals Unmet:  Not Applicable  Comments: Pt able to follow exercise prescription today without complaint.  Will continue to monitor for progression.    Dr. Oneil Pinal is Medical Director for [facility] Cardiac Rehabilitation.  Dr. Fuad Aleskerov is Medical Director for [facility] Pulmonary Rehabilitation.

## 2023-11-15 ENCOUNTER — Encounter

## 2023-11-15 ENCOUNTER — Ambulatory Visit: Payer: Self-pay

## 2023-11-15 VITALS — BP 105/69 | HR 58 | Resp 20 | Ht 72.0 in | Wt 202.5 lb

## 2023-11-15 DIAGNOSIS — Z5189 Encounter for other specified aftercare: Secondary | ICD-10-CM

## 2023-11-15 DIAGNOSIS — Z951 Presence of aortocoronary bypass graft: Secondary | ICD-10-CM

## 2023-11-15 NOTE — Progress Notes (Signed)
 [address] [place] [address]             [phone]       HPI: Allen Tapia is a 68 year old male medical history of hypertension, CAD, hyperlipidemia, abdominal aortic aneurysm, asthma, multiple sclerosis, GERD, prediabetes, and depression returns for routine postoperative follow-up having undergone CABG X 2.  LIMA LAD, RSVG diagonal, Endoscopic greater saphenous vein harvest on the right on 09/16/2023 with Dr. Shyrl.  He presents today for follow-up of incisional wound.  He finds that the top part of his incision was not healing and had scabbed over.  4 days ago he pushed on the area and some white purulent discharge was expressed.  He contacted the office and doxycycline  was prescribed.  He has noticed improvement in the site after starting doxycycline  and is here at the clinic today for evaluation.  Current Outpatient Medications  Medication Sig Dispense Refill   acetaminophen  (TYLENOL ) 500 MG tablet Take 2 tablets (1,000 mg total) by mouth every 6 (six) hours as needed for mild pain. 60 tablet 0   amiodarone  (PACERONE ) 200 MG tablet Take 2 tablets (400 mg total) by mouth 2 (two) times daily. For 10 days then take 1 tablet (200mg ) TWICE daily for 14 days then take 1 tablet (200mg ) ONCE daily thereafter. (Patient taking differently: Take 400 mg by mouth daily. For 10 days then take 1 tablet (200mg ) TWICE daily for 14 days then take 1 tablet (200mg ) ONCE daily thereafter.) 60 tablet 2   amiodarone  (PACERONE ) 200 MG tablet Take 1 tablet (200 mg total) by mouth daily. 90 tablet 0   amphetamine -dextroamphetamine (ADDERALL XR) 25 MG 24 hr capsule Take 25 mg by mouth daily as needed (focus).     apixaban  (ELIQUIS ) 5 MG TABS tablet Take 1 tablet (5 mg total) by mouth 2 (two) times daily. 180 tablet 0   aspirin  EC 81 MG tablet Take 81 mg by mouth daily. Swallow whole.     baclofen  (LIORESAL ) 20 MG tablet Take 30 mg by mouth 3 (three) times daily.     BREZTRI   AEROSPHERE 160-9-4.8 MCG/ACT AERO USE 2 INHALATIONS IN THE MORNING AND AT BEDTIME 32.1 g 3   buPROPion  (WELLBUTRIN  XL) 150 MG 24 hr tablet Take 150 mg by mouth daily.     doxycycline  (VIBRAMYCIN ) 100 MG capsule Take 1 capsule (100 mg total) by mouth 2 (two) times daily for 10 days. 20 capsule 0   gabapentin  (NEURONTIN ) 300 MG capsule Take 1-2 capsules (300-600 mg total) by mouth 2 (two) times daily. 600 mg in am and 300 mg in afternoon 60 capsule 0   metoprolol  tartrate (LOPRESSOR ) 25 MG tablet Take 0.5 tablets (12.5 mg total) by mouth 2 (two) times daily. 90 tablet 0   montelukast  (SINGULAIR ) 10 MG tablet TAKE 1 TABLET AT BEDTIME 90 tablet 3   Multiple Vitamins-Minerals (MULTIVITAMIN WITH MINERALS) tablet Take 1 tablet by mouth daily.     Ofatumumab (KESIMPTA) 20 MG/0.4ML SOAJ Inject 20 mg into the skin every 30 (thirty) days. 1st day of every month     pantoprazole  (PROTONIX ) 20 MG tablet Take 20 mg by mouth at bedtime.     Polyethyl Glycol-Propyl Glycol (SYSTANE) 0.4-0.3 % SOLN Place 1 drop into both eyes 4 (four) times daily as needed (dry eyes).     rosuvastatin  (CRESTOR ) 40 MG tablet Take 40 mg by mouth at bedtime.     Spacer/Aero-Holding Chambers (AEROCHAMBER MV)  inhaler Use as instructed 1 each 0   tamsulosin  (FLOMAX ) 0.4 MG CAPS capsule TAKE 1 CAPSULE DAILY 90 capsule 3   traZODone  (DESYREL ) 100 MG tablet Take 100 mg by mouth at bedtime.     VENTOLIN  HFA 108 (90 Base) MCG/ACT inhaler Inhale 2 puffs into the lungs every 4 (four) hours as needed for wheezing or shortness of breath. 3 each 4   Vibegron  (GEMTESA ) 75 MG TABS Take 1 tablet (75 mg total) by mouth daily. 90 tablet 3   VITAMIN D  PO Take 1 capsule by mouth daily.     No current facility-administered medications for this visit.   Vitals:   11/15/23 1057  BP: 105/69  Pulse: (!) 58  Resp: 20  SpO2: 95%   Review of Systems  Constitutional:  Negative for fever.  Respiratory: Negative.  Negative for cough and shortness of  breath.   Cardiovascular: Negative.  Negative for chest pain and leg swelling.    Physical Exam Constitutional:      Appearance: Normal appearance.  HENT:     Head: Normocephalic and atraumatic.  Skin:    General: Skin is warm and dry.      Neurological:     General: No focal deficit present.     Mental Status: He is alert and oriented to person, place, and time.      Diagnostic Tests: CLINICAL DATA:  Status post coronary artery bypass graft.   EXAM: CHEST - 2 VIEW   COMPARISON:  September 18, 2023.   FINDINGS: Stable cardiomediastinal silhouette. Sternotomy wires are noted. No acute pulmonary disease is noted. Bony thorax is unremarkable.   IMPRESSION: No active cardiopulmonary disease.     Electronically Signed   By: Lynwood Landy Raddle M.D.   On: 10/25/2023 11:41   Impression/Plan: Visit for wound check - He is to finish doxycycline  as prescribed.  Continue to keep site clean and dry.  Scabbed area continues to bleed he can cover with a piece of gauze and tape.  - Reach back out to clinic if incision area does not continue to improve with doxycycline  or if he starts to have fever, incision site with increased erythema, increased purulent drainage.   S/P CABG x 2 -Continue with cardiac rehab -Follow up as needed with [facility]   Manuelita CHRISTELLA Rough, PA-C [facility] [phone]

## 2023-11-15 NOTE — Patient Instructions (Signed)
-  Finish Doxycycline  as prescribed -Continue to keep incision clean with soap and water -Please reach back out to clinic if area does not continue to improve with doxycycline

## 2023-11-15 NOTE — Progress Notes (Signed)
 Daily Session Note  Patient Details  Name: Allen Tapia MRN: [id number] Date of Birth: April 23, 1955 Referring Provider:   Flowsheet Row Cardiac Rehab from 10/30/2023 in [facility] Cardiac and Pulmonary Rehab  Referring Provider Dr. Shelda Bruckner    Encounter Date: 11/15/2023  Check In:  Session Check In - 11/15/23 0851       Check-In   Supervising physician immediately available to respond to emergencies See telemetry face sheet for immediately available ER MD    Location [facility]-Cardiac & Pulmonary Rehab    Staff Present Burnard Davenport RN,BSN,MPA;Joseph Hood RCP,RRT,BSRT;Noah Tickle, [place], Exercise Physiologist;Maxon Conetta BS, Exercise Physiologist    Virtual Visit No    Medication changes reported     No    Fall or balance concerns reported    No    Tobacco Cessation No Change    Warm-up and Cool-down Performed on first and last piece of equipment    Resistance Training Performed Yes    VAD Patient? No    PAD/SET Patient? No      Pain Assessment   Currently in Pain? No/denies             Social History   Tobacco Use  Smoking Status Former   Current packs/day: 0.00   Average packs/day: 1 pack/day for 32.5 years (32.5 ttl pk-yrs)   Types: Cigarettes   Start date: 53   Quit date: 09/23/2006   Years since quitting: 17.1  Smokeless Tobacco Never    Goals Met:  Independence with exercise equipment Exercise tolerated well No report of concerns or symptoms today Strength training completed today  Goals Unmet:  Not Applicable  Comments: Pt able to follow exercise prescription today without complaint.  Will continue to monitor for progression.    Dr. Oneil Pinal is Medical Director for [facility] Cardiac Rehabilitation.  Dr. Fuad Aleskerov is Medical Director for [facility] Pulmonary Rehabilitation.

## 2023-11-18 ENCOUNTER — Encounter

## 2023-11-18 DIAGNOSIS — Z951 Presence of aortocoronary bypass graft: Secondary | ICD-10-CM

## 2023-11-18 NOTE — Progress Notes (Signed)
 Daily Session Note  Patient Details  Name: Allen Tapia MRN: [id number] Date of Birth: [birth year] Referring Provider:   Flowsheet Row Cardiac Rehab from 10/30/2023 in [facility] Cardiac and Pulmonary Rehab  Referring Provider Dr. Shelda Bruckner    Encounter Date: 11/18/2023  Check In:  Session Check In - 11/18/23 1054       Check-In   Supervising physician immediately available to respond to emergencies See telemetry face sheet for immediately available ER MD    Location [facility]-Cardiac & Pulmonary Rehab    Staff Present Burnard Davenport RN,BSN,MPA;Joseph Rolinda RCP,RRT,BSRT;Laura Cates RN,BSN;Gabrielle Wakeland Dyane BS, ACSM CEP, Exercise Physiologist    Virtual Visit No    Medication changes reported     No    Fall or balance concerns reported    No    Tobacco Cessation No Change    Warm-up and Cool-down Performed on first and last piece of equipment    Resistance Training Performed Yes    VAD Patient? No    PAD/SET Patient? No      Pain Assessment   Currently in Pain? No/denies             Social History   Tobacco Use  Smoking Status Former   Current packs/day: 0.00   Average packs/day: 1 pack/day for 32.5 years (32.5 ttl pk-yrs)   Types: Cigarettes   Start date: 66   Quit date: 09/23/2006   Years since quitting: 17.1  Smokeless Tobacco Never    Goals Met:  Independence with exercise equipment Exercise tolerated well No report of concerns or symptoms today Strength training completed today  Goals Unmet:  Not Applicable  Comments: Pt able to follow exercise prescription today without complaint.  Will continue to monitor for progression.    Dr. Oneil Pinal is Medical Director for [facility] Cardiac Rehabilitation.  Dr. Fuad Aleskerov is Medical Director for [facility] Pulmonary Rehabilitation.

## 2023-11-20 ENCOUNTER — Encounter: Admitting: Emergency Medicine

## 2023-11-20 ENCOUNTER — Encounter

## 2023-11-20 DIAGNOSIS — Z951 Presence of aortocoronary bypass graft: Secondary | ICD-10-CM | POA: Diagnosis not present

## 2023-11-20 NOTE — Progress Notes (Signed)
 Daily Session Note  Patient Details  Name: Allen Tapia MRN: [id number] Date of Birth: 09/27/1955 Referring Provider:   Flowsheet Row Cardiac Rehab from 10/30/2023 in [facility] Cardiac and Pulmonary Rehab  Referring Provider Dr. Shelda Bruckner    Encounter Date: 11/20/2023  Check In:  Session Check In - 11/20/23 1110       Check-In   Supervising physician immediately available to respond to emergencies See telemetry face sheet for immediately available ER MD    Location [facility]-Cardiac & Pulmonary Rehab    Staff Present Rollene Paterson, MS, Exercise Physiologist;Maxon Conetta BS, Exercise Physiologist;Yoona Ishii RN,BSN;Laureen Delores, BS, RRT, CPFT    Virtual Visit No    Medication changes reported     No    Fall or balance concerns reported    No    Tobacco Cessation No Change    Warm-up and Cool-down Performed on first and last piece of equipment    Resistance Training Performed Yes    VAD Patient? No    PAD/SET Patient? No      Pain Assessment   Currently in Pain? No/denies             Social History   Tobacco Use  Smoking Status Former   Current packs/day: 0.00   Average packs/day: 1 pack/day for 32.5 years (32.5 ttl pk-yrs)   Types: Cigarettes   Start date: 71   Quit date: 09/23/2006   Years since quitting: 17.1  Smokeless Tobacco Never    Goals Met:  Independence with exercise equipment Exercise tolerated well No report of concerns or symptoms today Strength training completed today  Goals Unmet:  Not Applicable  Comments: Pt able to follow exercise prescription today without complaint.  Will continue to monitor for progression.    Dr. Oneil Pinal is Medical Director for [facility] Cardiac Rehabilitation.  Dr. Fuad Aleskerov is Medical Director for [facility] Pulmonary Rehabilitation.

## 2023-11-20 NOTE — Progress Notes (Signed)
 Cardiac Individual Treatment Plan  Patient Details  Name: Allen Tapia MRN: [id number] Date of Birth: 21-Dec-1955 Referring Provider:   Flowsheet Row Cardiac Rehab from 10/30/2023 in [facility] Cardiac and Pulmonary Rehab  Referring Provider Dr. Shelda Bruckner    Initial Encounter Date:  Flowsheet Row Cardiac Rehab from 10/30/2023 in [facility] Cardiac and Pulmonary Rehab  Date 10/30/23    Visit Diagnosis: S/P CABG x 2  Patient's Home Medications on Admission:  Current Outpatient Medications:    acetaminophen  (TYLENOL ) 500 MG tablet, Take 2 tablets (1,000 mg total) by mouth every 6 (six) hours as needed for mild pain., Disp: 60 tablet, Rfl: 0   amiodarone  (PACERONE ) 200 MG tablet, Take 2 tablets (400 mg total) by mouth 2 (two) times daily. For 10 days then take 1 tablet (200mg ) TWICE daily for 14 days then take 1 tablet (200mg ) ONCE daily thereafter. (Patient taking differently: Take 400 mg by mouth daily. For 10 days then take 1 tablet (200mg ) TWICE daily for 14 days then take 1 tablet (200mg ) ONCE daily thereafter.), Disp: 60 tablet, Rfl: 2   amiodarone  (PACERONE ) 200 MG tablet, Take 1 tablet (200 mg total) by mouth daily., Disp: 90 tablet, Rfl: 0   amphetamine -dextroamphetamine (ADDERALL XR) 25 MG 24 hr capsule, Take 25 mg by mouth daily as needed (focus)., Disp: , Rfl:    apixaban  (ELIQUIS ) 5 MG TABS tablet, Take 1 tablet (5 mg total) by mouth 2 (two) times daily., Disp: 180 tablet, Rfl: 0   aspirin  EC 81 MG tablet, Take 81 mg by mouth daily. Swallow whole., Disp: , Rfl:    baclofen  (LIORESAL ) 20 MG tablet, Take 30 mg by mouth 3 (three) times daily., Disp: , Rfl:    BREZTRI  AEROSPHERE 160-9-4.8 MCG/ACT AERO, USE 2 INHALATIONS IN THE MORNING AND AT BEDTIME, Disp: 32.1 g, Rfl: 3   buPROPion  (WELLBUTRIN  XL) 150 MG 24 hr tablet, Take 150 mg by mouth daily., Disp: , Rfl:    doxycycline  (VIBRAMYCIN ) 100 MG capsule, Take 1 capsule (100 mg total) by mouth 2 (two) times daily for 10 days., Disp: 20  capsule, Rfl: 0   gabapentin  (NEURONTIN ) 300 MG capsule, Take 1-2 capsules (300-600 mg total) by mouth 2 (two) times daily. 600 mg in am and 300 mg in afternoon, Disp: 60 capsule, Rfl: 0   metoprolol  tartrate (LOPRESSOR ) 25 MG tablet, Take 0.5 tablets (12.5 mg total) by mouth 2 (two) times daily., Disp: 90 tablet, Rfl: 0   montelukast  (SINGULAIR ) 10 MG tablet, TAKE 1 TABLET AT BEDTIME, Disp: 90 tablet, Rfl: 3   Multiple Vitamins-Minerals (MULTIVITAMIN WITH MINERALS) tablet, Take 1 tablet by mouth daily., Disp: , Rfl:    Ofatumumab (KESIMPTA) 20 MG/0.4ML SOAJ, Inject 20 mg into the skin every 30 (thirty) days. 1st day of every month, Disp: , Rfl:    pantoprazole  (PROTONIX ) 20 MG tablet, Take 20 mg by mouth at bedtime., Disp: , Rfl:    Polyethyl Glycol-Propyl Glycol (SYSTANE) 0.4-0.3 % SOLN, Place 1 drop into both eyes 4 (four) times daily as needed (dry eyes)., Disp: , Rfl:    rosuvastatin  (CRESTOR ) 40 MG tablet, Take 40 mg by mouth at bedtime., Disp: , Rfl:    Spacer/Aero-Holding Chambers (AEROCHAMBER MV) inhaler, Use as instructed, Disp: 1 each, Rfl: 0   tamsulosin  (FLOMAX ) 0.4 MG CAPS capsule, TAKE 1 CAPSULE DAILY, Disp: 90 capsule, Rfl: 3   traZODone  (DESYREL ) 100 MG tablet, Take 100 mg by mouth at bedtime., Disp: , Rfl:    VENTOLIN  HFA 108 (  90 Base) MCG/ACT inhaler, Inhale 2 puffs into the lungs every 4 (four) hours as needed for wheezing or shortness of breath., Disp: 3 each, Rfl: 4   Vibegron  (GEMTESA ) 75 MG TABS, Take 1 tablet (75 mg total) by mouth daily., Disp: 90 tablet, Rfl: 3   VITAMIN D  PO, Take 1 capsule by mouth daily., Disp: , Rfl:   Past Medical History: Past Medical History:  Diagnosis Date   Anginal pain (HCC)    atypical chest pain cardiac cath performed at that time   Arthritis    Asthma    Atypical chest pain    Barrett's esophagus    Bilateral cataracts    BPH (benign prostatic hyperplasia)    Cancer (HCC)    skin ca in face and hand   Chicken pox    Chronic  constipation    no current problem per pt 09/12/23   COPD (chronic obstructive pulmonary disease) (HCC)    Coronary artery disease 07/14/2007   a.) LHC 07/14/2007: 40% pRCA-1, 40% pRCA-2, 40% dRCA, 20% RPL1, 30% LM, 20% mLCx-1, 40% mLCx-2, 20% OM3-1, 20% OM3-2, 20% pLAD, 40% mLAD-1, 70% mLAD-2, 40% mLAD-3, 30% dLAD, 40% D1, 40% D2 --> med mgmt.   Depression    Foreign body (FB) in soft tissue    Former tobacco use    GERD (gastroesophageal reflux disease)    History of 2019 novel coronavirus disease (COVID-19) 04/07/2019   a.) tested (+) at [facility] 04/16/2019, 05/30/2021; b.) 2 other (+) tests at home (dates unknown)   History of kidney stones    Hyperlipidemia    Hyperplastic colon polyp    Hypertension    Infrarenal abdominal aortic aneurysm (AAA) without rupture (HCC)    a.) MR lumbar spine 12/30/2020: infrarenal saccular AAA measuring 2.2 cm; b.) CTA A/P 01/05/2021: measured 2.6 cm; c.) s/p EVAR 03/03/2021   Ischemic heart disease 12/20/2010   Long term current use of immunosuppressive drug    a.) on ofatumumab for MS   MS (multiple sclerosis) (HCC)    a.) on ofatumumab   Neuromuscular disorder (HCC)    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 - takes gabapentin    Pneumonia    x several   Pre-diabetes    Shingles    Tubular adenoma of colon 08/23/2016    Tobacco Use: Social History   Tobacco Use  Smoking Status Former   Current packs/day: 0.00   Average packs/day: 1 pack/day for 32.5 years (32.5 ttl pk-yrs)   Types: Cigarettes   Start date: 23   Quit date: 09/23/2006   Years since quitting: 17.1  Smokeless Tobacco Never    Labs: Review Flowsheet  More data may exist      Latest Ref Rng & Units 02/28/2021 06/06/2022 09/12/2023 09/16/2023 09/17/2023  Labs for ITP Cardiac and Pulmonary Rehab  Cholestrol 0 - 200 mg/dL - - - - 56   LDL (calc) 0 - 99 mg/dL - - - - 19   HDL-C >59 mg/dL - - - - 29   Trlycerides <150 mg/dL - - - - 40   Hemoglobin A1c 4.8 - 5.6 % - 6.1  5.8  - 5.9   PH,  Arterial 7.35 - 7.45 7.383  - - 7.309  7.284  7.358  7.345  7.406  7.394  7.335  -  PCO2 arterial 32 - 48 mmHg 44.2  - - 40.9  48.1  41.1  42.9  39.4  39.5  42.9  -  Bicarbonate 20.0 - 28.0 mmol/L 25.7  - -  20.6  23.0  23.1  23.4  24.7  24.4  24.2  22.9  -  TCO2 22 - 32 mmol/L - - - 22  24  24  21  25  26  24  26  25  24  22  24  26   -  Acid-base deficit 0.0 - 2.0 mmol/L - - - 5.0  4.0  2.0  2.0  1.0  1.0  3.0  -  O2 Saturation % 97.1  - - 96  100  99  95  100  76  100  100  -    Details       Multiple values from one day are sorted in reverse-chronological order          Exercise Target Goals: Exercise Program Goal: Individual exercise prescription set using results from initial 6 min walk test and THRR while considering  patient's activity barriers and safety.   Exercise Prescription Goal: Initial exercise prescription builds to 30-45 minutes a day of aerobic activity, 2-3 days per week.  Home exercise guidelines will be given to patient during program as part of exercise prescription that the participant will acknowledge.   Education: Aerobic Exercise: - Group verbal and visual presentation on the components of exercise prescription. Introduces F.I.T.T principle from ACSM for exercise prescriptions.  Reviews F.I.T.T. principles of aerobic exercise including progression. Written material provided at class time.   Education: Resistance Exercise: - Group verbal and visual presentation on the components of exercise prescription. Introduces F.I.T.T principle from ACSM for exercise prescriptions  Reviews F.I.T.T. principles of resistance exercise including progression. Written material provided at class time.    Education: Exercise & Equipment Safety: - Individual verbal instruction and demonstration of equipment use and safety with use of the equipment. Flowsheet Row Cardiac Rehab from 11/13/2023 in [facility] Cardiac and Pulmonary Rehab  Date 10/30/23  Educator [facility]  Instruction Review  Code 1- Verbalizes Understanding    Education: Exercise Physiology & General Exercise Guidelines: - Group verbal and written instruction with models to review the exercise physiology of the cardiovascular system and associated critical values. Provides general exercise guidelines with specific guidelines to those with heart or lung disease. Written material provided at class time.   Education: Flexibility, Balance, Mind/Body Relaxation: - Group verbal and visual presentation with interactive activity on the components of exercise prescription. Introduces F.I.T.T principle from ACSM for exercise prescriptions. Reviews F.I.T.T. principles of flexibility and balance exercise training including progression. Also discusses the mind body connection.  Reviews various relaxation techniques to help reduce and manage stress (i.e. Deep breathing, progressive muscle relaxation, and visualization). Balance handout provided to take home. Written material provided at class time.   Activity Barriers & Risk Stratification:  Activity Barriers & Cardiac Risk Stratification - 10/30/23 1059       Activity Barriers & Cardiac Risk Stratification   Activity Barriers Arthritis;Right Knee Replacement;Left Knee Replacement;Joint Problems;Other (comment)    Comments hx of arthritis in R hand, elbow; also has MS but generally has no pain    Cardiac Risk Stratification High          6 Minute Walk:  6 Minute Walk     Row Name 10/30/23 1057         6 Minute Walk   Phase Initial     Distance 1395 feet     Walk Time 6 minutes     # of Rest Breaks 0     MPH 2.64  METS 3.48     RPE 11     Perceived Dyspnea  1     VO2 Peak 12.2     Symptoms No     Resting HR 59 bpm     Resting BP 124/62     Resting Oxygen Saturation  93 %     Exercise Oxygen Saturation  during 6 min walk 97 %     Max Ex. HR 115 bpm     Max Ex. BP 136/62     2 Minute Post BP 128/64        Oxygen Initial Assessment:   Oxygen  Re-Evaluation:   Oxygen Discharge (Final Oxygen Re-Evaluation):   Initial Exercise Prescription:  Initial Exercise Prescription - 10/30/23 1000       Date of Initial Exercise RX and Referring Provider   Date 10/30/23    Referring Provider Dr. Shelda Bruckner      Oxygen   Maintain Oxygen Saturation 88% or higher      Treadmill   MPH 2.5    Grade 0    Minutes 15    METs 2.91      Recumbant Bike   Level 2    RPM 50    Watts 25    Minutes 15    METs 3.5      NuStep   Level 2    SPM 80    Minutes 15    METs 3.5      T5 Nustep   Level 2    SPM 80    Minutes 15    METs 3.5      Biostep-RELP   Level 2    SPM 50    Minutes 15    METs 3.5      Track   Laps 35    Minutes 15    METs 2.9      Prescription Details   Duration Progress to 30 minutes of continuous aerobic without signs/symptoms of physical distress      Intensity   THRR 40-80% of Max Heartrate 96-133    Ratings of Perceived Exertion 11-13    Perceived Dyspnea 0-4      Progression   Progression Continue to progress workloads to maintain intensity without signs/symptoms of physical distress.      Resistance Training   Training Prescription Yes    Weight 5lb    Reps 10-15          Perform Capillary Blood Glucose checks as needed.  Exercise Prescription Changes:   Exercise Prescription Changes     Row Name 10/30/23 1100             Response to Exercise   Blood Pressure (Admit) 124/62       Blood Pressure (Exercise) 136/62       Blood Pressure (Exit) 128/64       Heart Rate (Admit) 59 bpm       Heart Rate (Exercise) 115 bpm       Heart Rate (Exit) 60 bpm       Oxygen Saturation (Admit) 93 %       Oxygen Saturation (Exercise) 97 %       Oxygen Saturation (Exit) 97 %       Rating of Perceived Exertion (Exercise) 11       Perceived Dyspnea (Exercise) 1       Symptoms none       Comments results  Exercise Comments:   Exercise Comments     Row Name  11/08/23 1110           Exercise Comments First full day of exercise!  Patient was oriented to gym and equipment including functions, settings, policies, and procedures.  Patient's individual exercise prescription and treatment plan were reviewed.  All starting workloads were established based on the results of the 6 minute walk test done at initial orientation visit.  The plan for exercise progression was also introduced and progression will be customized based on patient's performance and goals.          Exercise Goals and Review:   Exercise Goals     Row Name 10/30/23 1104             Exercise Goals   Increase Physical Activity Yes       Intervention Develop an individualized exercise prescription for aerobic and resistive training based on initial evaluation findings, risk stratification, comorbidities and participant's personal goals.;Provide advice, education, support and counseling about physical activity/exercise needs.       Expected Outcomes Short Term: Attend rehab on a regular basis to increase amount of physical activity.;Long Term: Add in home exercise to make exercise part of routine and to increase amount of physical activity.;Long Term: Exercising regularly at least 3-5 days a week.       Increase Strength and Stamina Yes       Intervention Provide advice, education, support and counseling about physical activity/exercise needs.;Develop an individualized exercise prescription for aerobic and resistive training based on initial evaluation findings, risk stratification, comorbidities and participant's personal goals.       Expected Outcomes Short Term: Increase workloads from initial exercise prescription for resistance, speed, and METs.;Short Term: Perform resistance training exercises routinely during rehab and add in resistance training at home;Long Term: Improve cardiorespiratory fitness, muscular endurance and strength as measured by increased METs and functional capacity  ( )       Able to understand and use rate of perceived exertion (RPE) scale Yes       Intervention Provide education and explanation on how to use RPE scale       Expected Outcomes Short Term: Able to use RPE daily in rehab to express subjective intensity level;Long Term:  Able to use RPE to guide intensity level when exercising independently       Able to understand and use Dyspnea scale Yes       Intervention Provide education and explanation on how to use Dyspnea scale       Expected Outcomes Short Term: Able to use Dyspnea scale daily in rehab to express subjective sense of shortness of breath during exertion;Long Term: Able to use Dyspnea scale to guide intensity level when exercising independently       Knowledge and understanding of Target Heart Rate Range (THRR) Yes       Intervention Provide education and explanation of THRR including how the numbers were predicted and where they are located for reference       Expected Outcomes Short Term: Able to state/look up THRR;Short Term: Able to use daily as guideline for intensity in rehab;Long Term: Able to use THRR to govern intensity when exercising independently       Able to check pulse independently Yes       Intervention Provide education and demonstration on how to check pulse in carotid and radial arteries.;Review the importance of being able to check your own pulse for safety during independent  exercise       Expected Outcomes Short Term: Able to explain why pulse checking is important during independent exercise;Long Term: Able to check pulse independently and accurately       Understanding of Exercise Prescription Yes       Intervention Provide education, explanation, and written materials on patient's individual exercise prescription       Expected Outcomes Short Term: Able to explain program exercise prescription;Long Term: Able to explain home exercise prescription to exercise independently          Exercise Goals Re-Evaluation  :  Exercise Goals Re-Evaluation     Row Name 11/08/23 1111             Exercise Goal Re-Evaluation   Exercise Goals Review Increase Physical Activity;Able to understand and use rate of perceived exertion (RPE) scale;Knowledge and understanding of Target Heart Rate Range (THRR);Understanding of Exercise Prescription;Increase Strength and Stamina;Able to understand and use Dyspnea scale;Able to check pulse independently       Comments Reviewed RPE and dyspnea scale, THR and program prescription with pt today.  Pt voiced understanding and was given a copy of goals to take home.       Expected Outcomes Short: Use RPE daily to regulate intensity. Long: Follow program prescription in THR.          Discharge Exercise Prescription (Final Exercise Prescription Changes):  Exercise Prescription Changes - 10/30/23 1100       Response to Exercise   Blood Pressure (Admit) 124/62    Blood Pressure (Exercise) 136/62    Blood Pressure (Exit) 128/64    Heart Rate (Admit) 59 bpm    Heart Rate (Exercise) 115 bpm    Heart Rate (Exit) 60 bpm    Oxygen Saturation (Admit) 93 %    Oxygen Saturation (Exercise) 97 %    Oxygen Saturation (Exit) 97 %    Rating of Perceived Exertion (Exercise) 11    Perceived Dyspnea (Exercise) 1    Symptoms none    Comments results          Nutrition:  Target Goals: Understanding of nutrition guidelines, daily intake of sodium 1500mg , cholesterol 200mg , calories 30% from fat and 7% or less from saturated fats, daily to have 5 or more servings of fruits and vegetables.  Education: Nutrition 1 -Group instruction provided by verbal, written material, interactive activities, discussions, models, and posters to present general guidelines for heart healthy nutrition including macronutrients, label reading, and promoting whole foods over processed counterparts. Education serves as Pensions consultant of discussion of heart healthy eating for all. Written material provided at  class time.    Education: Nutrition 2 -Group instruction provided by verbal, written material, interactive activities, discussions, models, and posters to present general guidelines for heart healthy nutrition including sodium, cholesterol, and saturated fat. Providing guidance of habit forming to improve blood pressure, cholesterol, and body weight. Written material provided at class time. Flowsheet Row Cardiac Rehab from 11/13/2023 in [facility] Cardiac and Pulmonary Rehab  Date 11/13/23  Educator jg  Instruction Review Code 1- Verbalizes Understanding      Biometrics:  Pre Biometrics - 10/30/23 1104       Pre Biometrics   Height 6' 0.6 (1.844 m)    Weight 202 lb 8 oz (91.9 kg)    Waist Circumference 41 inches    Hip Circumference 41 inches    Waist to Hip Ratio 1 %    BMI (Calculated) 27.01    Single Leg Stand 14.9  seconds           Nutrition Therapy Plan and Nutrition Goals:   Nutrition Assessments:  MEDIFICTS Score Key: >=70 Need to make dietary changes  40-70 Heart Healthy Diet <= 40 Therapeutic Level Cholesterol Diet  Flowsheet Row Cardiac Rehab from 10/28/2023 in [facility] Cardiac and Pulmonary Rehab  Picture Your Plate Total Score on Admission 53   Picture Your Plate Scores: <59 Unhealthy dietary pattern with much room for improvement. 41-50 Dietary pattern unlikely to meet recommendations for good health and room for improvement. 51-60 More healthful dietary pattern, with some room for improvement.  >60 Healthy dietary pattern, although there may be some specific behaviors that could be improved.    Nutrition Goals Re-Evaluation:   Nutrition Goals Discharge (Final Nutrition Goals Re-Evaluation):   Psychosocial: Target Goals: Acknowledge presence or absence of significant depression and/or stress, maximize coping skills, provide positive support system. Participant is able to verbalize types and ability to use techniques and skills needed for reducing stress and  depression.   Education: Stress, Anxiety, and Depression - Group verbal and visual presentation to define topics covered.  Reviews how body is impacted by stress, anxiety, and depression.  Also discusses healthy ways to reduce stress and to treat/manage anxiety and depression. Written material provided at class time.   Education: Sleep Hygiene -Provides group verbal and written instruction about how sleep can affect your health.  Define sleep hygiene, discuss sleep cycles and impact of sleep habits. Review good sleep hygiene tips.   Initial Review & Psychosocial Screening:  Initial Psych Review & Screening - 10/28/23 1412       Initial Review   Current issues with History of Depression;Current Stress Concerns    Comments Patient stated he has a good support system and currently takes wellbutrin . 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 He stated his wife has been very attentive to his recovery and health. Patient stated that he is now able to drive and that is helping him get back to normal. 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 Overall, patient stated he is doing well and does not have any thoughts or made any attempts to harm himself.      Family Dynamics   Good Support System? Yes    Comments wife is very supportive      Barriers   Psychosocial barriers to participate in program The patient should benefit from training in stress management and relaxation.   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         Quality of Life Scores:   Quality of Life - 10/28/23 1433       Quality of Life   Select Quality of Life      Quality of Life Scores   Health/Function Pre 22.83 %    Socioeconomic Pre 20.3 %    Psych/Spiritual Pre 24.69 %    Family Pre 23.57 %    GLOBAL Pre  26.4 %         Scores of 19 and below usually indicate a poorer quality of life in these areas.  A difference of  2-3 points is a clinically meaningful difference.  A difference of 2-3 points in the total score of the Quality of  Life Index has been associated with significant improvement in overall quality of life, self-image, physical symptoms, and general health in studies assessing change in quality of life.  PHQ-9: Review Flowsheet       10/30/2023  Depression screen PHQ 2/9  Decreased Interest 1  Down, Depressed, Hopeless 1  PHQ - 2 Score 2  Altered sleeping 1  Tired, decreased energy 3  Change in appetite 2  Feeling bad or failure about yourself  0  Trouble concentrating 1  Moving slowly or fidgety/restless 1  Suicidal thoughts 0  PHQ-9 Score 10  Difficult doing work/chores Not difficult at all   Interpretation of Total Score  Total Score Depression Severity:  1-4 = Minimal depression, 5-9 = Mild depression, 10-14 = Moderate depression, 15-19 = Moderately severe depression, 20-27 = Severe depression   Psychosocial Evaluation and Intervention:  Psychosocial Evaluation - 10/28/23 1418       Psychosocial Evaluation & Interventions   Interventions Stress management education;Relaxation education;Encouraged to exercise with the program and follow exercise prescription    Comments Patient stated he has a good support system and currently takes wellbutrin . 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 He stated his wife has been very attentive to his recovery and health. Patient stated that he is now able to drive and that is helping him get back to normal. 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 Overall, patient stated he is doing well and does not have any thoughts or made any attempts to harm himself.    Expected Outcomes ST: Attend cardiac rehab for education and exercise; LT:  develop and maintain positive self-care habits    Continue Psychosocial Services  Follow up required by staff          Psychosocial Re-Evaluation:   Psychosocial Discharge (Final Psychosocial Re-Evaluation):   Vocational Rehabilitation: Provide vocational rehab assistance to qualifying candidates.   Vocational Rehab Evaluation & Intervention:  Vocational Rehab - 10/28/23 1412       Initial Vocational Rehab Evaluation & Intervention   Assessment shows need for Vocational Rehabilitation No          Education: Education Goals: Education classes will be provided on a variety of topics geared toward better understanding of heart health and risk factor modification. Participant will state understanding/return demonstration of topics presented as noted by education test scores.  Learning Barriers/Preferences:  Learning Barriers/Preferences - 10/28/23 1411       Learning Barriers/Preferences   Learning Barriers None    Learning Preferences Individual Instruction;Group Instruction;Verbal Instruction;Video;Written Material;Computer/Internet;Audio          General Cardiac Education Topics:  AED/CPR: - Group verbal and written instruction with the use of models to demonstrate the basic use of the AED with the basic ABC's of resuscitation.   Test and Procedures: - Group verbal and visual presentation and models provide information about basic cardiac anatomy and function. Reviews the testing methods done to diagnose heart disease and the outcomes of the test results. Describes the treatment choices: Medical Management, Angioplasty, or Coronary Bypass Surgery for treating various heart conditions including Myocardial Infarction, Angina, Valve Disease, and Cardiac Arrhythmias. Written material provided at class time.   Medication Safety: - Group verbal and visual instruction to review commonly prescribed medications for heart and lung disease. Reviews the medication, class of  the drug, and side effects. Includes the steps to properly  store meds and maintain the prescription regimen. Written material provided at class time.   Intimacy: - Group verbal instruction through game format to discuss how heart and lung disease can affect sexual intimacy. Written material provided at class time.   Know Your Numbers and Heart Failure: - Group verbal and visual instruction to discuss disease risk factors for cardiac and pulmonary disease and treatment options.  Reviews associated critical values for Overweight/Obesity, Hypertension, Cholesterol, and Diabetes.  Discusses basics of heart failure: signs/symptoms and treatments.  Introduces Heart Failure Zone chart for action plan for heart failure. Written material provided at class time.   Infection Prevention: - Provides verbal and written material to individual with discussion of infection control including proper hand washing and proper equipment cleaning during exercise session. Flowsheet Row Cardiac Rehab from 11/13/2023 in [facility] Cardiac and Pulmonary Rehab  Date 10/30/23  Educator [facility]  Instruction Review Code 1- Verbalizes Understanding    Falls Prevention: - Provides verbal and written material to individual with discussion of falls prevention and safety. Flowsheet Row Cardiac Rehab from 11/13/2023 in [facility] Cardiac and Pulmonary Rehab  Date 10/30/23  Educator [facility]  Instruction Review Code 1- Verbalizes Understanding    Other: -Provides group and verbal instruction on various topics (see comments)   Knowledge Questionnaire Score:  Knowledge Questionnaire Score - 10/28/23 1432       Knowledge Questionnaire Score   Pre Score 25/26          Core Components/Risk Factors/Patient Goals at Admission:  Personal Goals and Risk Factors at Admission - 10/28/23 1410       Core Components/Risk Factors/Patient Goals on Admission    Weight Management Weight Loss   current weight 196, would like to weigh 180; Also  interested in reducing dose of statin as 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    Expected Outcomes Short Term: Continued assessment and intervention until BP is < 140/40mm HG in hypertensive participants. < 130/34mm HG in hypertensive participants with diabetes, heart failure or chronic kidney disease.;Long Term: Maintenance of blood pressure at goal levels.    Lipids Yes    Intervention Provide education and support for participant on nutrition & aerobic/resistive exercise along with prescribed medications to achieve LDL 70mg , HDL >40mg .    Expected Outcomes Short Term: Participant states understanding of desired cholesterol values and is compliant with medications prescribed. Participant is following exercise prescription and nutrition guidelines.;Long Term: Cholesterol controlled with medications as prescribed, with individualized exercise RX and with personalized nutrition plan. Value goals: LDL < 70mg , HDL > 40 mg.    Stress Yes    Intervention Offer individual and/or small group education and counseling on adjustment to heart disease, stress management and health-related lifestyle change. Teach and support self-help strategies.;Refer participants experiencing significant psychosocial distress to appropriate mental health specialists for further evaluation and treatment. When possible, include family members and significant others in education/counseling sessions.    Expected Outcomes Short Term: Participant demonstrates changes in health-related behavior, relaxation and other stress management skills, ability to obtain effective social support, and compliance with psychotropic medications if  prescribed.;Long Term: Emotional wellbeing is indicated by absence of clinically significant psychosocial distress or social isolation.          Education:Diabetes - Individual verbal and  written instruction to review signs/symptoms of diabetes, desired ranges of glucose level fasting, after meals and with exercise. Acknowledge that pre and post exercise glucose checks will be done for 3 sessions at entry of program.   Core Components/Risk Factors/Patient Goals Review:    Core Components/Risk Factors/Patient Goals at Discharge (Final Review):    ITP Comments:  ITP Comments     Row Name 10/28/23 1424 10/30/23 1056 11/08/23 1110 11/20/23 1045     ITP Comments Initial phone call completed. Diagnosis can be found in [facility] 09/16/2023. EP Orientation scheduled for Wednesday, October 30, 2023, @ 0900. Completed and gym orientation for cardiac rehab. Initial ITP created and sent for review to Dr. Oneil Pinal, Medical Director. First full day of exercise!  Patient was oriented to gym and equipment including functions, settings, policies, and procedures.  Patient's individual exercise prescription and treatment plan were reviewed.  All starting workloads were established based on the results of the 6 minute walk test done at initial orientation visit.  The plan for exercise progression was also introduced and progression will be customized based on patient's performance and goals. Virtual Visit completed. Patient informed on EP and RD appointment and 6 Minute walk test. Patient also informed of patient health questionnaires on My Chart. Patient Verbalizes understanding. For visist on 08/28/2023. New to program.       Comments: 30 day review

## 2023-11-22 ENCOUNTER — Encounter

## 2023-11-22 DIAGNOSIS — Z951 Presence of aortocoronary bypass graft: Secondary | ICD-10-CM

## 2023-11-22 NOTE — Progress Notes (Signed)
 Daily Session Note  Patient Details  Name: Allen Tapia MRN: [id number] Date of Birth: Jun 21, 1955 Referring Provider:   Flowsheet Row Cardiac Rehab from 10/30/2023 in [facility] Cardiac and Pulmonary Rehab  Referring Provider Dr. Shelda Bruckner    Encounter Date: 11/22/2023  Check In:  Session Check In - 11/22/23 1105       Check-In   Supervising physician immediately available to respond to emergencies See telemetry face sheet for immediately available ER MD    Location [facility]-Cardiac & Pulmonary Rehab    Staff Present Burnard Davenport RN,BSN,MPA;Joseph Rolinda RCP,RRT,BSRT;Noah Tickle, [place], Exercise Physiologist    Virtual Visit No    Medication changes reported     No    Fall or balance concerns reported    No    Tobacco Cessation No Change    Warm-up and Cool-down Performed on first and last piece of equipment    Resistance Training Performed Yes    VAD Patient? No    PAD/SET Patient? No      Pain Assessment   Currently in Pain? No/denies             Social History   Tobacco Use  Smoking Status Former   Current packs/day: 0.00   Average packs/day: 1 pack/day for 32.5 years (32.5 ttl pk-yrs)   Types: Cigarettes   Start date: 46   Quit date: 09/23/2006   Years since quitting: 17.1  Smokeless Tobacco Never    Goals Met:  Independence with exercise equipment Exercise tolerated well No report of concerns or symptoms today Strength training completed today  Goals Unmet:  Not Applicable  Comments: Pt able to follow exercise prescription today without complaint.  Will continue to monitor for progression.    Dr. Oneil Pinal is Medical Director for [facility] Cardiac Rehabilitation.  Dr. Fuad Aleskerov is Medical Director for [facility] Pulmonary Rehabilitation.

## 2023-11-27 ENCOUNTER — Encounter

## 2023-11-29 ENCOUNTER — Encounter

## 2023-12-02 ENCOUNTER — Encounter

## 2023-12-04 ENCOUNTER — Encounter

## 2023-12-06 ENCOUNTER — Encounter

## 2023-12-09 ENCOUNTER — Encounter

## 2023-12-11 ENCOUNTER — Encounter

## 2023-12-13 ENCOUNTER — Encounter

## 2023-12-16 ENCOUNTER — Encounter: Attending: Cardiology

## 2023-12-16 DIAGNOSIS — Z951 Presence of aortocoronary bypass graft: Secondary | ICD-10-CM | POA: Diagnosis present

## 2023-12-16 NOTE — Progress Notes (Signed)
 Daily Session Note  Patient Details  Name: Allen Tapia MRN: [id number] Date of Birth: 1956/02/25 Referring Provider:   Flowsheet Row Cardiac Rehab from 10/30/2023 in [facility] Cardiac and Pulmonary Rehab  Referring Provider Dr. Shelda Bruckner    Encounter Date: 12/16/2023  Check In:  Session Check In - 12/16/23 1144       Check-In   Supervising physician immediately available to respond to emergencies See telemetry face sheet for immediately available ER MD    Location [facility]-Cardiac & Pulmonary Rehab    Staff Present Burnard Davenport RN,BSN,MPA;Joseph Rolinda RCP,RRT,BSRT;Laura Cates RN,BSN;Jarius Dieudonne Dyane BS, ACSM CEP, Exercise Physiologist;Noah Tickle, BS, Exercise Physiologist    Virtual Visit No    Medication changes reported     No    Fall or balance concerns reported    No    Tobacco Cessation No Change    Warm-up and Cool-down Performed on first and last piece of equipment    Resistance Training Performed Yes    VAD Patient? No    PAD/SET Patient? No      Pain Assessment   Currently in Pain? No/denies             Social History   Tobacco Use  Smoking Status Former   Current packs/day: 0.00   Average packs/day: 1 pack/day for 32.5 years (32.5 ttl pk-yrs)   Types: Cigarettes   Start date: 22   Quit date: 09/23/2006   Years since quitting: 17.2  Smokeless Tobacco Never    Goals Met:  Independence with exercise equipment Exercise tolerated well No report of concerns or symptoms today Strength training completed today  Goals Unmet:  Not Applicable  Comments: Pt able to follow exercise prescription today without complaint.  Will continue to monitor for progression.    Dr. Oneil Pinal is Medical Director for [facility] Cardiac Rehabilitation.  Dr. Fuad Aleskerov is Medical Director for [facility] Pulmonary Rehabilitation.

## 2023-12-18 ENCOUNTER — Encounter: Admitting: Emergency Medicine

## 2023-12-18 DIAGNOSIS — Z951 Presence of aortocoronary bypass graft: Secondary | ICD-10-CM

## 2023-12-18 NOTE — Progress Notes (Signed)
 Daily Session Note  Patient Details  Name: Allen Tapia MRN: [id number] Date of Birth: [birth year] Referring Provider:   Flowsheet Row Cardiac Rehab from 10/30/2023 in [facility] Cardiac and Pulmonary Rehab  Referring Provider Dr. Shelda Bruckner    Encounter Date: 12/18/2023  Check In:  Session Check In - 12/18/23 1105       Check-In   Supervising physician immediately available to respond to emergencies See telemetry face sheet for immediately available ER MD    Location [facility]-Cardiac & Pulmonary Rehab    Staff Present Selinda Pereyra RDN,LDN;Margaret Best, MS, Exercise Physiologist;Krissa Utke RN,BSN;Mary Godley, RN, DNP, NE-BC;Maxon Conetta BS, Exercise Physiologist    Virtual Visit No    Medication changes reported     No    Fall or balance concerns reported    No    Tobacco Cessation No Change    Warm-up and Cool-down Performed on first and last piece of equipment    Resistance Training Performed Yes    VAD Patient? No    PAD/SET Patient? No      Pain Assessment   Currently in Pain? No/denies             Social History   Tobacco Use  Smoking Status Former   Current packs/day: 0.00   Average packs/day: 1 pack/day for 32.5 years (32.5 ttl pk-yrs)   Types: Cigarettes   Start date: 62   Quit date: 09/23/2006   Years since quitting: 17.2  Smokeless Tobacco Never    Goals Met:  Independence with exercise equipment Exercise tolerated well No report of concerns or symptoms today Strength training completed today  Goals Unmet:  Not Applicable  Comments: Pt able to follow exercise prescription today without complaint.  Will continue to monitor for progression.    Dr. Oneil Pinal is Medical Director for [facility] Cardiac Rehabilitation.  Dr. Fuad Aleskerov is Medical Director for [facility] Pulmonary Rehabilitation.

## 2023-12-18 NOTE — Progress Notes (Signed)
 Cardiac Individual Treatment Plan  Patient Details  Name: Allen Tapia MRN: [id number] Date of Birth: [birth year] Referring Provider:   Flowsheet Row Cardiac Rehab from 10/30/2023 in [facility] Cardiac and Pulmonary Rehab  Referring Provider Dr. Shelda Bruckner    Initial Encounter Date:  Flowsheet Row Cardiac Rehab from 10/30/2023 in [facility] Cardiac and Pulmonary Rehab  Date 10/30/23    Visit Diagnosis: S/P CABG x 2  Patient's Home Medications on Admission:  Current Outpatient Medications:    acetaminophen  (TYLENOL ) 500 MG tablet, Take 2 tablets (1,000 mg total) by mouth every 6 (six) hours as needed for mild pain., Disp: 60 tablet, Rfl: 0   amiodarone  (PACERONE ) 200 MG tablet, Take 2 tablets (400 mg total) by mouth 2 (two) times daily. For 10 days then take 1 tablet (200mg ) TWICE daily for 14 days then take 1 tablet (200mg ) ONCE daily thereafter. (Patient taking differently: Take 400 mg by mouth daily. For 10 days then take 1 tablet (200mg ) TWICE daily for 14 days then take 1 tablet (200mg ) ONCE daily thereafter.), Disp: 60 tablet, Rfl: 2   amiodarone  (PACERONE ) 200 MG tablet, Take 1 tablet (200 mg total) by mouth daily., Disp: 90 tablet, Rfl: 0   amphetamine -dextroamphetamine (ADDERALL XR) 25 MG 24 hr capsule, Take 25 mg by mouth daily as needed (focus)., Disp: , Rfl:    apixaban  (ELIQUIS ) 5 MG TABS tablet, Take 1 tablet (5 mg total) by mouth 2 (two) times daily., Disp: 180 tablet, Rfl: 0   aspirin  EC 81 MG tablet, Take 81 mg by mouth daily. Swallow whole., Disp: , Rfl:    baclofen  (LIORESAL ) 20 MG tablet, Take 30 mg by mouth 3 (three) times daily., Disp: , Rfl:    BREZTRI  AEROSPHERE 160-9-4.8 MCG/ACT AERO, USE 2 INHALATIONS IN THE MORNING AND AT BEDTIME, Disp: 32.1 g, Rfl: 3   buPROPion  (WELLBUTRIN  XL) 150 MG 24 hr tablet, Take 150 mg by mouth daily., Disp: , Rfl:    gabapentin  (NEURONTIN ) 300 MG capsule, Take 1-2 capsules (300-600 mg total) by mouth 2 (two) times daily. 600 mg in am and  300 mg in afternoon, Disp: 60 capsule, Rfl: 0   metoprolol  tartrate (LOPRESSOR ) 25 MG tablet, Take 0.5 tablets (12.5 mg total) by mouth 2 (two) times daily., Disp: 90 tablet, Rfl: 0   montelukast  (SINGULAIR ) 10 MG tablet, TAKE 1 TABLET AT BEDTIME, Disp: 90 tablet, Rfl: 3   Multiple Vitamins-Minerals (MULTIVITAMIN WITH MINERALS) tablet, Take 1 tablet by mouth daily., Disp: , Rfl:    Ofatumumab (KESIMPTA) 20 MG/0.4ML SOAJ, Inject 20 mg into the skin every 30 (thirty) days. 1st day of every month, Disp: , Rfl:    pantoprazole  (PROTONIX ) 20 MG tablet, Take 20 mg by mouth at bedtime., Disp: , Rfl:    Polyethyl Glycol-Propyl Glycol (SYSTANE) 0.4-0.3 % SOLN, Place 1 drop into both eyes 4 (four) times daily as needed (dry eyes)., Disp: , Rfl:    rosuvastatin  (CRESTOR ) 40 MG tablet, Take 40 mg by mouth at bedtime., Disp: , Rfl:    Spacer/Aero-Holding Chambers (AEROCHAMBER MV) inhaler, Use as instructed, Disp: 1 each, Rfl: 0   tamsulosin  (FLOMAX ) 0.4 MG CAPS capsule, TAKE 1 CAPSULE DAILY, Disp: 90 capsule, Rfl: 3   traZODone  (DESYREL ) 100 MG tablet, Take 100 mg by mouth at bedtime., Disp: , Rfl:    VENTOLIN  HFA 108 (90 Base) MCG/ACT inhaler, Inhale 2 puffs into the lungs every 4 (four) hours as needed for wheezing or shortness of breath., Disp: 3 each, Rfl: 4  Vibegron  (GEMTESA ) 75 MG TABS, Take 1 tablet (75 mg total) by mouth daily., Disp: 90 tablet, Rfl: 3   VITAMIN D  PO, Take 1 capsule by mouth daily., Disp: , Rfl:   Past Medical History: Past Medical History:  Diagnosis Date   Anginal pain    atypical chest pain cardiac cath performed at that time   Arthritis    Asthma    Atypical chest pain    Barrett's esophagus    Bilateral cataracts    BPH (benign prostatic hyperplasia)    Cancer (HCC)    skin ca in face and hand   Chicken pox    Chronic constipation    no current problem per pt 09/12/23   COPD (chronic obstructive pulmonary disease) (HCC)    Coronary artery disease 07/14/2007   a.)  LHC 07/14/2007: 40% pRCA-1, 40% pRCA-2, 40% dRCA, 20% RPL1, 30% LM, 20% mLCx-1, 40% mLCx-2, 20% OM3-1, 20% OM3-2, 20% pLAD, 40% mLAD-1, 70% mLAD-2, 40% mLAD-3, 30% dLAD, 40% D1, 40% D2 --> med mgmt.   Depression    Foreign body (FB) in soft tissue    Former tobacco use    GERD (gastroesophageal reflux disease)    History of 2019 novel coronavirus disease (COVID-19) 04/07/2019   a.) tested (+) at [facility] 04/16/2019, 05/30/2021; b.) 2 other (+) tests at home (dates unknown)   History of kidney stones    Hyperlipidemia    Hyperplastic colon polyp    Hypertension    Infrarenal abdominal aortic aneurysm (AAA) without rupture    a.) MR lumbar spine 12/30/2020: infrarenal saccular AAA measuring 2.2 cm; b.) CTA A/P 01/05/2021: measured 2.6 cm; c.) s/p EVAR 03/03/2021   Ischemic heart disease 12/20/2010   Long term current use of immunosuppressive drug    a.) on ofatumumab for MS   MS (multiple sclerosis)    a.) on ofatumumab   Neuromuscular disorder (HCC)    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 - takes gabapentin    Pneumonia    x several   Pre-diabetes    Shingles    Tubular adenoma of colon 08/23/2016    Tobacco Use: Social History   Tobacco Use  Smoking Status Former   Current packs/day: 0.00   Average packs/day: 1 pack/day for 32.5 years (32.5 ttl pk-yrs)   Types: Cigarettes   Start date: 45   Quit date: 09/23/2006   Years since quitting: 17.2  Smokeless Tobacco Never    Labs: Review Flowsheet  More data may exist      Latest Ref Rng & Units 02/28/2021 06/06/2022 09/12/2023 09/16/2023 09/17/2023  Labs for ITP Cardiac and Pulmonary Rehab  Cholestrol 0 - 200 mg/dL - - - - 56   LDL (calc) 0 - 99 mg/dL - - - - 19   HDL-C >59 mg/dL - - - - 29   Trlycerides <150 mg/dL - - - - 40   Hemoglobin A1c 4.8 - 5.6 % - 6.1  5.8  - 5.9   PH, Arterial 7.35 - 7.45 7.383  - - 7.309  7.284  7.358  7.345  7.406  7.394  7.335  -  PCO2 arterial 32 - 48 mmHg 44.2  - - 40.9  48.1  41.1  42.9  39.4  39.5  42.9   -  Bicarbonate 20.0 - 28.0 mmol/L 25.7  - - 20.6  23.0  23.1  23.4  24.7  24.4  24.2  22.9  -  TCO2 22 - 32 mmol/L - - - 22  24  24  21  25  26  24  26  25  24  22  24  26   -  Acid-base deficit 0.0 - 2.0 mmol/L - - - 5.0  4.0  2.0  2.0  1.0  1.0  3.0  -  O2 Saturation % 97.1  - - 96  100  99  95  100  76  100  100  -    Details       Multiple values from one day are sorted in reverse-chronological order          Exercise Target Goals: Exercise Program Goal: Individual exercise prescription set using results from initial 6 min walk test and THRR while considering  patient's activity barriers and safety.   Exercise Prescription Goal: Initial exercise prescription builds to 30-45 minutes a day of aerobic activity, 2-3 days per week.  Home exercise guidelines will be given to patient during program as part of exercise prescription that the participant will acknowledge.   Education: Aerobic Exercise: - Group verbal and visual presentation on the components of exercise prescription. Introduces F.I.T.T principle from ACSM for exercise prescriptions.  Reviews F.I.T.T. principles of aerobic exercise including progression. Written material provided at class time.   Education: Resistance Exercise: - Group verbal and visual presentation on the components of exercise prescription. Introduces F.I.T.T principle from ACSM for exercise prescriptions  Reviews F.I.T.T. principles of resistance exercise including progression. Written material provided at class time.    Education: Exercise & Equipment Safety: - Individual verbal instruction and demonstration of equipment use and safety with use of the equipment. Flowsheet Row Cardiac Rehab from 11/20/2023 in [facility] Cardiac and Pulmonary Rehab  Date 10/30/23  Educator [facility]  Instruction Review Code 1- Verbalizes Understanding    Education: Exercise Physiology & General Exercise Guidelines: - Group verbal and written instruction with models to review  the exercise physiology of the cardiovascular system and associated critical values. Provides general exercise guidelines with specific guidelines to those with heart or lung disease. Written material provided at class time.   Education: Flexibility, Balance, Mind/Body Relaxation: - Group verbal and visual presentation with interactive activity on the components of exercise prescription. Introduces F.I.T.T principle from ACSM for exercise prescriptions. Reviews F.I.T.T. principles of flexibility and balance exercise training including progression. Also discusses the mind body connection.  Reviews various relaxation techniques to help reduce and manage stress (i.e. Deep breathing, progressive muscle relaxation, and visualization). Balance handout provided to take home. Written material provided at class time.   Activity Barriers & Risk Stratification:  Activity Barriers & Cardiac Risk Stratification - 10/30/23 1059       Activity Barriers & Cardiac Risk Stratification   Activity Barriers Arthritis;Right Knee Replacement;Left Knee Replacement;Joint Problems;Other (comment)    Comments hx of arthritis in R hand, elbow; also has MS but generally has no pain    Cardiac Risk Stratification High          6 Minute Walk:  6 Minute Walk     Row Name 10/30/23 1057         6 Minute Walk   Phase Initial     Distance 1395 feet     Walk Time 6 minutes     # of Rest Breaks 0     MPH 2.64     METS 3.48     RPE 11     Perceived Dyspnea  1     VO2 Peak 12.2     Symptoms No  Resting HR 59 bpm     Resting BP 124/62     Resting Oxygen Saturation  93 %     Exercise Oxygen Saturation  during 6 min walk 97 %     Max Ex. HR 115 bpm     Max Ex. BP 136/62     2 Minute Post BP 128/64        Oxygen Initial Assessment:   Oxygen Re-Evaluation:   Oxygen Discharge (Final Oxygen Re-Evaluation):   Initial Exercise Prescription:  Initial Exercise Prescription - 10/30/23 1000       Date of  Initial Exercise RX and Referring Provider   Date 10/30/23    Referring Provider Dr. Shelda Bruckner      Oxygen   Maintain Oxygen Saturation 88% or higher      Treadmill   MPH 2.5    Grade 0    Minutes 15    METs 2.91      Recumbant Bike   Level 2    RPM 50    Watts 25    Minutes 15    METs 3.5      NuStep   Level 2    SPM 80    Minutes 15    METs 3.5      T5 Nustep   Level 2    SPM 80    Minutes 15    METs 3.5      Biostep-RELP   Level 2    SPM 50    Minutes 15    METs 3.5      Track   Laps 35    Minutes 15    METs 2.9      Prescription Details   Duration Progress to 30 minutes of continuous aerobic without signs/symptoms of physical distress      Intensity   THRR 40-80% of Max Heartrate 96-133    Ratings of Perceived Exertion 11-13    Perceived Dyspnea 0-4      Progression   Progression Continue to progress workloads to maintain intensity without signs/symptoms of physical distress.      Resistance Training   Training Prescription Yes    Weight 5lb    Reps 10-15          Perform Capillary Blood Glucose checks as needed.  Exercise Prescription Changes:   Exercise Prescription Changes     Row Name 10/30/23 1100 11/21/23 1600 12/03/23 1300         Response to Exercise   Blood Pressure (Admit) 124/62 102/54 112/62     Blood Pressure (Exercise) 136/62 126/52 128/70     Blood Pressure (Exit) 128/64 102/54 102/58     Heart Rate (Admit) 59 bpm 58 bpm 53 bpm     Heart Rate (Exercise) 115 bpm 97 bpm 99 bpm     Heart Rate (Exit) 60 bpm 68 bpm 57 bpm     Oxygen Saturation (Admit) 93 % -- --     Oxygen Saturation (Exercise) 97 % -- --     Oxygen Saturation (Exit) 97 % -- --     Rating of Perceived Exertion (Exercise) 11 13 13      Perceived Dyspnea (Exercise) 1 -- --     Symptoms none none none     Comments results First three days of exercise --     Duration -- Continue with 30 min of aerobic exercise without signs/symptoms of  physical distress. Continue with 30 min of aerobic exercise without signs/symptoms of  physical distress.     Intensity -- THRR unchanged THRR unchanged       Progression   Progression -- Continue to progress workloads to maintain intensity without signs/symptoms of physical distress. Continue to progress workloads to maintain intensity without signs/symptoms of physical distress.     Average METs -- 2.82 3.09       Resistance Training   Training Prescription -- Yes Yes     Weight -- 5 lb 5 lb     Reps -- 10-15 10-15       Interval Training   Interval Training -- No No       Treadmill   MPH -- 2.5 2.5     Grade -- 0 0     Minutes -- 15 15     METs -- 2.91 2.91       Recumbant Bike   Level -- 3 5     Watts -- 29 43     Minutes -- 15 15     METs -- 2.86 3.47       NuStep   Level -- 2  T6 nustep 5  T6 nustep     Minutes -- 15 15     METs -- 2.3 3.5       Biostep-RELP   Level -- 1 --     Minutes -- 15 --     METs -- 3 --       Oxygen   Maintain Oxygen Saturation -- 88% or higher 88% or higher        Exercise Comments:   Exercise Comments     Row Name 11/08/23 1110           Exercise Comments First full day of exercise!  Patient was oriented to gym and equipment including functions, settings, policies, and procedures.  Patient's individual exercise prescription and treatment plan were reviewed.  All starting workloads were established based on the results of the 6 minute walk test done at initial orientation visit.  The plan for exercise progression was also introduced and progression will be customized based on patient's performance and goals.          Exercise Goals and Review:   Exercise Goals     Row Name 10/30/23 1104             Exercise Goals   Increase Physical Activity Yes       Intervention Develop an individualized exercise prescription for aerobic and resistive training based on initial evaluation findings, risk stratification, comorbidities  and participant's personal goals.;Provide advice, education, support and counseling about physical activity/exercise needs.       Expected Outcomes Short Term: Attend rehab on a regular basis to increase amount of physical activity.;Long Term: Add in home exercise to make exercise part of routine and to increase amount of physical activity.;Long Term: Exercising regularly at least 3-5 days a week.       Increase Strength and Stamina Yes       Intervention Provide advice, education, support and counseling about physical activity/exercise needs.;Develop an individualized exercise prescription for aerobic and resistive training based on initial evaluation findings, risk stratification, comorbidities and participant's personal goals.       Expected Outcomes Short Term: Increase workloads from initial exercise prescription for resistance, speed, and METs.;Short Term: Perform resistance training exercises routinely during rehab and add in resistance training at home;Long Term: Improve cardiorespiratory fitness, muscular endurance and strength as measured by increased METs and functional capacity ( )  Able to understand and use rate of perceived exertion (RPE) scale Yes       Intervention Provide education and explanation on how to use RPE scale       Expected Outcomes Short Term: Able to use RPE daily in rehab to express subjective intensity level;Long Term:  Able to use RPE to guide intensity level when exercising independently       Able to understand and use Dyspnea scale Yes       Intervention Provide education and explanation on how to use Dyspnea scale       Expected Outcomes Short Term: Able to use Dyspnea scale daily in rehab to express subjective sense of shortness of breath during exertion;Long Term: Able to use Dyspnea scale to guide intensity level when exercising independently       Knowledge and understanding of Target Heart Rate Range (THRR) Yes       Intervention Provide education and  explanation of THRR including how the numbers were predicted and where they are located for reference       Expected Outcomes Short Term: Able to state/look up THRR;Short Term: Able to use daily as guideline for intensity in rehab;Long Term: Able to use THRR to govern intensity when exercising independently       Able to check pulse independently Yes       Intervention Provide education and demonstration on how to check pulse in carotid and radial arteries.;Review the importance of being able to check your own pulse for safety during independent exercise       Expected Outcomes Short Term: Able to explain why pulse checking is important during independent exercise;Long Term: Able to check pulse independently and accurately       Understanding of Exercise Prescription Yes       Intervention Provide education, explanation, and written materials on patient's individual exercise prescription       Expected Outcomes Short Term: Able to explain program exercise prescription;Long Term: Able to explain home exercise prescription to exercise independently          Exercise Goals Re-Evaluation :  Exercise Goals Re-Evaluation     Row Name 11/08/23 1111 11/21/23 1610 12/03/23 1358 12/17/23 1517       Exercise Goal Re-Evaluation   Exercise Goals Review Increase Physical Activity;Able to understand and use rate of perceived exertion (RPE) scale;Knowledge and understanding of Target Heart Rate Range (THRR);Understanding of Exercise Prescription;Increase Strength and Stamina;Able to understand and use Dyspnea scale;Able to check pulse independently Increase Physical Activity;Increase Strength and Stamina;Understanding of Exercise Prescription Increase Physical Activity;Increase Strength and Stamina;Understanding of Exercise Prescription Increase Physical Activity;Increase Strength and Stamina;Understanding of Exercise Prescription    Comments Reviewed RPE and dyspnea scale, THR and program prescription with pt  today.  Pt voiced understanding and was given a copy of goals to take home. Allen Tapia is off to a good start in the program. He began using the treadmill at a speed of 2.5 mph with no incline. He also did well on the recumbent bike at level 3 and the T6 nustep at level 2. We will continue to monitor his progress in the program. Allen Tapia is doing well in rehab. He improved to level 5 on both the T6 nustep and the recumbent bike. He also continues to walk on the treadmill at a speed of 2.5 mph with no incline. We will continue to monitor his progress in the program. Allen Tapia returned to rehab on 12/16/2023 for the first time after not attending  since 11/22/2023. We will continue to monitor his progress in the program.    Expected Outcomes Short: Use RPE daily to regulate intensity. Long: Follow program prescription in THR. Short: Continue to follow current exercise prescription. Long: Continue exercise to improve strength and stamina. Short: Begin to progressively increase treadmill workload. Long: Continue exercise to improve strength and stamina. Short: Return to regular attendance in rehab. Long: Continue exercise to improve strength and stamina.       Discharge Exercise Prescription (Final Exercise Prescription Changes):  Exercise Prescription Changes - 12/03/23 1300       Response to Exercise   Blood Pressure (Admit) 112/62    Blood Pressure (Exercise) 128/70    Blood Pressure (Exit) 102/58    Heart Rate (Admit) 53 bpm    Heart Rate (Exercise) 99 bpm    Heart Rate (Exit) 57 bpm    Rating of Perceived Exertion (Exercise) 13    Symptoms none    Duration Continue with 30 min of aerobic exercise without signs/symptoms of physical distress.    Intensity THRR unchanged      Progression   Progression Continue to progress workloads to maintain intensity without signs/symptoms of physical distress.    Average METs 3.09      Resistance Training   Training Prescription Yes    Weight 5 lb    Reps 10-15       Interval Training   Interval Training No      Treadmill   MPH 2.5    Grade 0    Minutes 15    METs 2.91      Recumbant Bike   Level 5    Watts 43    Minutes 15    METs 3.47      NuStep   Level 5   T6 nustep   Minutes 15    METs 3.5      Oxygen   Maintain Oxygen Saturation 88% or higher          Nutrition:  Target Goals: Understanding of nutrition guidelines, daily intake of sodium 1500mg , cholesterol 200mg , calories 30% from fat and 7% or less from saturated fats, daily to have 5 or more servings of fruits and vegetables.  Education: Nutrition 1 -Group instruction provided by verbal, written material, interactive activities, discussions, models, and posters to present general guidelines for heart healthy nutrition including macronutrients, label reading, and promoting whole foods over processed counterparts. Education serves as Pensions consultant of discussion of heart healthy eating for all. Written material provided at class time.    Education: Nutrition 2 -Group instruction provided by verbal, written material, interactive activities, discussions, models, and posters to present general guidelines for heart healthy nutrition including sodium, cholesterol, and saturated fat. Providing guidance of habit forming to improve blood pressure, cholesterol, and body weight. Written material provided at class time. Flowsheet Row Cardiac Rehab from 11/20/2023 in [facility] Cardiac and Pulmonary Rehab  Date 11/13/23  Educator jg  Instruction Review Code 1- Verbalizes Understanding      Biometrics:  Pre Biometrics - 10/30/23 1104       Pre Biometrics   Height 6' 0.6 (1.844 m)    Weight 202 lb 8 oz (91.9 kg)    Waist Circumference 41 inches    Hip Circumference 41 inches    Waist to Hip Ratio 1 %    BMI (Calculated) 27.01    Single Leg Stand 14.9 seconds           Nutrition Therapy Plan and  Nutrition Goals:   Nutrition Assessments:  MEDIFICTS Score Key: >=70 Need to  make dietary changes  40-70 Heart Healthy Diet <= 40 Therapeutic Level Cholesterol Diet  Flowsheet Row Cardiac Rehab from 10/28/2023 in [facility] Cardiac and Pulmonary Rehab  Picture Your Plate Total Score on Admission 53   Picture Your Plate Scores: <59 Unhealthy dietary pattern with much room for improvement. 41-50 Dietary pattern unlikely to meet recommendations for good health and room for improvement. 51-60 More healthful dietary pattern, with some room for improvement.  >60 Healthy dietary pattern, although there may be some specific behaviors that could be improved.    Nutrition Goals Re-Evaluation:  Nutrition Goals Re-Evaluation     Row Name 12/16/23 1113             Goals   Comment RD deferred          Nutrition Goals Discharge (Final Nutrition Goals Re-Evaluation):  Nutrition Goals Re-Evaluation - 12/16/23 1113       Goals   Comment RD deferred          Psychosocial: Target Goals: Acknowledge presence or absence of significant depression and/or stress, maximize coping skills, provide positive support system. Participant is able to verbalize types and ability to use techniques and skills needed for reducing stress and depression.   Education: Stress, Anxiety, and Depression - Group verbal and visual presentation to define topics covered.  Reviews how body is impacted by stress, anxiety, and depression.  Also discusses healthy ways to reduce stress and to treat/manage anxiety and depression. Written material provided at class time.   Education: Sleep Hygiene -Provides group verbal and written instruction about how sleep can affect your health.  Define sleep hygiene, discuss sleep cycles and impact of sleep habits. Review good sleep hygiene tips.   Initial Review & Psychosocial Screening:  Initial Psych Review & Screening - 10/28/23 1412       Initial Review   Current issues with History of Depression;Current Stress Concerns    Comments Patient stated he has a  good support system and currently takes wellbutrin . 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 He stated his wife has been very attentive to his recovery and health. Patient stated that he is now able to drive and that is helping him get back to normal. 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 Overall, patient stated he is doing well and does not have any thoughts or made any attempts to harm himself.      Family Dynamics   Good Support System? Yes    Comments wife is very supportive      Barriers   Psychosocial barriers to participate in program The patient should benefit from training in stress management and relaxation.   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         Quality of Life Scores:   Quality of Life - 10/28/23 1433       Quality of Life   Select Quality of Life      Quality of Life Scores   Health/Function Pre 22.83 %    Socioeconomic Pre 20.3 %    Psych/Spiritual Pre 24.69 %    Family Pre 23.57 %    GLOBAL Pre 26.4 %  Scores of 19 and below usually indicate a poorer quality of life in these areas.  A difference of  2-3 points is a clinically meaningful difference.  A difference of 2-3 points in the total score of the Quality of Life Index has been associated with significant improvement in overall quality of life, self-image, physical symptoms, and general health in studies assessing change in quality of life.  PHQ-9: Review Flowsheet       12/16/2023 10/30/2023  Depression screen PHQ 2/9  Decreased Interest 1 1  Down, Depressed, Hopeless 0 1  PHQ - 2 Score 1 2  Altered sleeping 1 1  Tired, decreased energy 3 3  Change in appetite 1 2  Feeling bad or failure about yourself  0 0  Trouble concentrating 0 1  Moving slowly or fidgety/restless 1 1   Suicidal thoughts 0 0  PHQ-9 Score 7 10  Difficult doing work/chores Not difficult at all Not difficult at all   Interpretation of Total Score  Total Score Depression Severity:  1-4 = Minimal depression, 5-9 = Mild depression, 10-14 = Moderate depression, 15-19 = Moderately severe depression, 20-27 = Severe depression   Psychosocial Evaluation and Intervention:  Psychosocial Evaluation - 10/28/23 1418       Psychosocial Evaluation & Interventions   Interventions Stress management education;Relaxation education;Encouraged to exercise with the program and follow exercise prescription    Comments Patient stated he has a good support system and currently takes wellbutrin . 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 He stated his wife has been very attentive to his recovery and health. Patient stated that he is now able to drive and that is helping him get back to normal. 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 Overall, patient stated he is doing well and does not have any thoughts or made any attempts to harm himself.    Expected Outcomes ST: Attend cardiac rehab for education and exercise; LT: develop and maintain positive self-care habits    Continue Psychosocial Services  Follow up required by staff          Psychosocial Re-Evaluation:  Psychosocial Re-Evaluation     Row Name 12/16/23 1106             Psychosocial Re-Evaluation   Current issues with Current Sleep Concerns       Comments Allen Tapia is doing well and states he has no stress concerns at this time. He is sleeping as well as he can, but has had ongoing sleep concerns his whole life.       Expected Outcomes Short: Continue to work to improve sleep, and limit stressors. Long: Continue to manage stressors of daily life through healthy means.       Interventions Encouraged to attend Cardiac Rehabilitation for the exercise        Continue Psychosocial Services  Follow up required by staff          Psychosocial Discharge (Final Psychosocial Re-Evaluation):  Psychosocial Re-Evaluation - 12/16/23 1106       Psychosocial Re-Evaluation   Current issues with Current Sleep Concerns    Comments Allen Tapia is doing well and states he has no stress concerns at this time. He is sleeping as well as he can, but has had ongoing sleep concerns his whole life.    Expected Outcomes Short: Continue to work to improve sleep, and limit stressors. Long:  Continue to manage stressors of daily life through healthy means.    Interventions Encouraged to attend Cardiac Rehabilitation for the exercise    Continue Psychosocial Services  Follow up required by staff          Vocational Rehabilitation: Provide vocational rehab assistance to qualifying candidates.   Vocational Rehab Evaluation & Intervention:  Vocational Rehab - 10/28/23 1412       Initial Vocational Rehab Evaluation & Intervention   Assessment shows need for Vocational Rehabilitation No          Education: Education Goals: Education classes will be provided on a variety of topics geared toward better understanding of heart health and risk factor modification. Participant will state understanding/return demonstration of topics presented as noted by education test scores.  Learning Barriers/Preferences:  Learning Barriers/Preferences - 10/28/23 1411       Learning Barriers/Preferences   Learning Barriers None    Learning Preferences Individual Instruction;Group Instruction;Verbal Instruction;Video;Written Material;Computer/Internet;Audio          General Cardiac Education Topics:  AED/CPR: - Group verbal and written instruction with the use of models to demonstrate the basic use of the AED with the basic ABC's of resuscitation.   Test and Procedures: - Group verbal and visual presentation and models provide information about basic cardiac anatomy and  function. Reviews the testing methods done to diagnose heart disease and the outcomes of the test results. Describes the treatment choices: Medical Management, Angioplasty, or Coronary Bypass Surgery for treating various heart conditions including Myocardial Infarction, Angina, Valve Disease, and Cardiac Arrhythmias. Written material provided at class time. Flowsheet Row Cardiac Rehab from 11/20/2023 in [facility] Cardiac and Pulmonary Rehab  Date 11/20/23  Educator KB  Instruction Review Code 1- Verbalizes Understanding    Medication Safety: - Group verbal and visual instruction to review commonly prescribed medications for heart and lung disease. Reviews the medication, class of the drug, and side effects. Includes the steps to properly store meds and maintain the prescription regimen. Written material provided at class time.   Intimacy: - Group verbal instruction through game format to discuss how heart and lung disease can affect sexual intimacy. Written material provided at class time.   Know Your Numbers and Heart Failure: - Group verbal and visual instruction to discuss disease risk factors for cardiac and pulmonary disease and treatment options.  Reviews associated critical values for Overweight/Obesity, Hypertension, Cholesterol, and Diabetes.  Discusses basics of heart failure: signs/symptoms and treatments.  Introduces Heart Failure Zone chart for action plan for heart failure. Written material provided at class time.   Infection Prevention: - Provides verbal and written material to individual with discussion of infection control including proper hand washing and proper equipment cleaning during exercise session. Flowsheet Row Cardiac Rehab from 11/20/2023 in [facility] Cardiac and Pulmonary Rehab  Date 10/30/23  Educator [facility]  Instruction Review Code 1- Verbalizes Understanding    Falls Prevention: - Provides verbal and written material to individual with discussion of falls prevention and  safety. Flowsheet Row Cardiac Rehab from 11/20/2023 in [facility] Cardiac and Pulmonary Rehab  Date 10/30/23  Educator [facility]  Instruction Review Code 1- Verbalizes Understanding    Other: -Provides group and verbal instruction on various topics (see comments)   Knowledge Questionnaire Score:  Knowledge Questionnaire Score - 10/28/23 1432       Knowledge Questionnaire Score   Pre Score 25/26          Core Components/Risk Factors/Patient Goals at Admission:  Personal Goals and Risk Factors  at Admission - 10/28/23 1410       Core Components/Risk Factors/Patient Goals on Admission    Weight Management Weight Loss   current weight 196, would like to weigh 180; Also interested in reducing dose of statin as 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    Expected Outcomes Short Term: Continued assessment and intervention until BP is < 140/103mm HG in hypertensive participants. < 130/5mm HG in hypertensive participants with diabetes, heart failure or chronic kidney disease.;Long Term: Maintenance of blood pressure at goal levels.    Lipids Yes    Intervention Provide education and support for participant on nutrition & aerobic/resistive exercise along with prescribed medications to achieve LDL 70mg , HDL >40mg .    Expected Outcomes Short Term: Participant states understanding of desired cholesterol values and is compliant with medications prescribed. Participant is following exercise prescription and nutrition guidelines.;Long Term: Cholesterol controlled with medications as prescribed, with individualized exercise RX and with personalized nutrition plan. Value goals: LDL < 70mg , HDL > 40 mg.    Stress Yes     Intervention Offer individual and/or small group education and counseling on adjustment to heart disease, stress management and health-related lifestyle change. Teach and support self-help strategies.;Refer participants experiencing significant psychosocial distress to appropriate mental health specialists for further evaluation and treatment. When possible, include family members and significant others in education/counseling sessions.    Expected Outcomes Short Term: Participant demonstrates changes in health-related behavior, relaxation and other stress management skills, ability to obtain effective social support, and compliance with psychotropic medications if prescribed.;Long Term: Emotional wellbeing is indicated by absence of clinically significant psychosocial distress or social isolation.          Education:Diabetes - Individual verbal and written instruction to review signs/symptoms of diabetes, desired ranges of glucose level fasting, after meals and with exercise. Acknowledge that pre and post exercise glucose checks will be done for 3 sessions at entry of program.   Core Components/Risk Factors/Patient Goals Review:   Goals and Risk Factor Review     Row Name 12/16/23 1115             Core Components/Risk Factors/Patient Goals Review   Personal Goals Review Hypertension;Weight Management/Obesity       Review Allen Tapia states that he has not been taking his BP as frequently as of late. He was taking it at least once a day for the first couple of weeks after his surgery but has reduced his frequency to only when he feels off. He also states that he is trying to ultimately lose about 20lbs, but has just recently recovered from a sickness and is excited to start exercising again to work towards his weight goals.       Expected Outcomes Short: Lose 5lbs, take BP at home more frequently. Long: Lose 20lbs.          Core Components/Risk Factors/Patient Goals at Discharge (Final Review):    Goals and Risk Factor Review - 12/16/23 1115       Core Components/Risk Factors/Patient Goals Review   Personal Goals Review Hypertension;Weight Management/Obesity    Review Allen Tapia states that he has not been taking his BP as frequently as of late. He was taking it at least once a day for the first couple of  weeks after his surgery but has reduced his frequency to only when he feels off. He also states that he is trying to ultimately lose about 20lbs, but has just recently recovered from a sickness and is excited to start exercising again to work towards his weight goals.    Expected Outcomes Short: Lose 5lbs, take BP at home more frequently. Long: Lose 20lbs.          ITP Comments:  ITP Comments     Row Name 10/28/23 1424 10/30/23 1056 11/08/23 1110 11/20/23 1045 12/18/23 1119   ITP Comments Initial phone call completed. Diagnosis can be found in [facility] 09/16/2023. EP Orientation scheduled for Wednesday, October 30, 2023, @ 0900. Completed and gym orientation for cardiac rehab. Initial ITP created and sent for review to Dr. Oneil Pinal, Medical Director. First full day of exercise!  Patient was oriented to gym and equipment including functions, settings, policies, and procedures.  Patient's individual exercise prescription and treatment plan were reviewed.  All starting workloads were established based on the results of the 6 minute walk test done at initial orientation visit.  The plan for exercise progression was also introduced and progression will be customized based on patient's performance and goals. Virtual Visit completed. Patient informed on EP and RD appointment and 6 Minute walk test. Patient also informed of patient health questionnaires on My Chart. Patient Verbalizes understanding. For visist on 08/28/2023. New to program. 30 Day review completed. Medical Director ITP review done; changes made as directed and signed approval by Medical Director. New to program.      Comments: 30 day  review

## 2023-12-20 ENCOUNTER — Encounter

## 2023-12-20 DIAGNOSIS — Z951 Presence of aortocoronary bypass graft: Secondary | ICD-10-CM

## 2023-12-20 NOTE — Progress Notes (Signed)
 Daily Session Note  Patient Details  Name: Allen Tapia MRN: [id number] Date of Birth: 09/26/1955 Referring Provider:   Flowsheet Row Cardiac Rehab from 10/30/2023 in [facility] Cardiac and Pulmonary Rehab  Referring Provider Dr. Shelda Bruckner    Encounter Date: 12/20/2023  Check In:  Session Check In - 12/20/23 0905       Check-In   Supervising physician immediately available to respond to emergencies See telemetry face sheet for immediately available ER MD    Location [facility]-Cardiac & Pulmonary Rehab    Staff Present Burnard Davenport RN,BSN,MPA;Joseph Hood RCP,RRT,BSRT;Maxon Conetta BS, Exercise Physiologist;Noah Tickle, BS, Exercise Physiologist    Virtual Visit No    Medication changes reported     No    Fall or balance concerns reported    No    Tobacco Cessation No Change    Warm-up and Cool-down Performed on first and last piece of equipment    Resistance Training Performed Yes    VAD Patient? No    PAD/SET Patient? No      Pain Assessment   Currently in Pain? No/denies             Social History   Tobacco Use  Smoking Status Former   Current packs/day: 0.00   Average packs/day: 1 pack/day for 32.5 years (32.5 ttl pk-yrs)   Types: Cigarettes   Start date: 71   Quit date: 09/23/2006   Years since quitting: 17.2  Smokeless Tobacco Never    Goals Met:  Independence with exercise equipment Exercise tolerated well No report of concerns or symptoms today Strength training completed today  Goals Unmet:  Not Applicable  Comments: Pt able to follow exercise prescription today without complaint.  Will continue to monitor for progression.    Dr. Oneil Pinal is Medical Director for [facility] Cardiac Rehabilitation.  Dr. Fuad Aleskerov is Medical Director for [facility] Pulmonary Rehabilitation.

## 2023-12-23 ENCOUNTER — Encounter

## 2023-12-23 DIAGNOSIS — Z951 Presence of aortocoronary bypass graft: Secondary | ICD-10-CM

## 2023-12-23 NOTE — Progress Notes (Signed)
 Daily Session Note  Patient Details  Name: MILLIE SHORB MRN: [id number] Date of Birth: 05-02-1955 Referring Provider:   Flowsheet Row Cardiac Rehab from 10/30/2023 in [facility] Cardiac and Pulmonary Rehab  Referring Provider Dr. Shelda Bruckner    Encounter Date: 12/23/2023  Check In:  Session Check In - 12/23/23 1059       Check-In   Supervising physician immediately available to respond to emergencies See telemetry face sheet for immediately available ER MD    Location [facility]-Cardiac & Pulmonary Rehab    Staff Present Burnard Davenport [facility] Peggi, RN, DNP, NE-BC;Joseph Hood RCP,RRT,BSRT;Laura Cates RN,BSN;Garrett Bowring [place] BS, ACSM CEP, Exercise Physiologist    Virtual Visit No    Medication changes reported     No    Fall or balance concerns reported    No    Tobacco Cessation No Change    Warm-up and Cool-down Performed on first and last piece of equipment    Resistance Training Performed Yes    VAD Patient? No    PAD/SET Patient? No      Pain Assessment   Currently in Pain? No/denies             Social History   Tobacco Use  Smoking Status Former   Current packs/day: 0.00   Average packs/day: 1 pack/day for 32.5 years (32.5 ttl pk-yrs)   Types: Cigarettes   Start date: 46   Quit date: 09/23/2006   Years since quitting: 17.2  Smokeless Tobacco Never    Goals Met:  Independence with exercise equipment Exercise tolerated well Personal goals reviewed No report of concerns or symptoms today Strength training completed today  Goals Unmet:  Not Applicable  Comments: Pt able to follow exercise prescription today without complaint.  Will continue to monitor for progression.    Reviewed home exercise with pt today.  Pt plans to walk for exercise.  Reviewed THR, pulse, RPE, sign and symptoms, pulse oximetery and when to call 911 or MD.  Also discussed weather considerations and indoor options.  Pt voiced understanding.   Dr. Oneil Pinal is Medical  Director for [facility] Cardiac Rehabilitation.  Dr. Fuad Aleskerov is Medical Director for [facility] Pulmonary Rehabilitation.

## 2023-12-25 ENCOUNTER — Encounter: Attending: Cardiology

## 2023-12-25 DIAGNOSIS — Z951 Presence of aortocoronary bypass graft: Secondary | ICD-10-CM | POA: Insufficient documentation

## 2023-12-25 NOTE — Progress Notes (Signed)
 Daily Session Note  Patient Details  Name: Allen Tapia MRN: [id number] Date of Birth: 12-Mar-1956 Referring Provider:   Flowsheet Row Cardiac Rehab from 10/30/2023 in [facility] Cardiac and Pulmonary Rehab  Referring Provider Dr. Shelda Bruckner    Encounter Date: 12/25/2023  Check In:  Session Check In - 12/25/23 1130       Check-In   Supervising physician immediately available to respond to emergencies See telemetry face sheet for immediately available ER MD    Location [facility]-Cardiac & Pulmonary Rehab    Staff Present Burnard Davenport [facility] Peggi, RN, DNP, NE-BC;Joseph [facility] RN,BSN;Margaret Best, MS, Exercise Physiologist    Virtual Visit No    Medication changes reported     No    Fall or balance concerns reported    No    Tobacco Cessation No Change    Warm-up and Cool-down Performed on first and last piece of equipment    Resistance Training Performed Yes    VAD Patient? No    PAD/SET Patient? No      Pain Assessment   Currently in Pain? No/denies             Social History   Tobacco Use  Smoking Status Former   Current packs/day: 0.00   Average packs/day: 1 pack/day for 32.5 years (32.5 ttl pk-yrs)   Types: Cigarettes   Start date: 58   Quit date: 09/23/2006   Years since quitting: 17.2  Smokeless Tobacco Never    Goals Met:  Proper associated with RPD/PD & O2 Sat Exercise tolerated well No report of concerns or symptoms today Strength training completed today  Goals Unmet:  Not Applicable  Comments: Pt able to follow exercise prescription today without complaint.  Will continue to monitor for progression.    Dr. Oneil Pinal is Medical Director for [facility] Cardiac Rehabilitation.  Dr. Fuad Aleskerov is Medical Director for [facility] Pulmonary Rehabilitation.

## 2023-12-27 ENCOUNTER — Encounter

## 2023-12-27 DIAGNOSIS — Z951 Presence of aortocoronary bypass graft: Secondary | ICD-10-CM

## 2023-12-27 NOTE — Progress Notes (Signed)
 Daily Session Note  Patient Details  Name: Allen Tapia MRN: [id number] Date of Birth: 1955-07-07 Referring Provider:   Flowsheet Row Cardiac Rehab from 10/30/2023 in [facility] Cardiac and Pulmonary Rehab  Referring Provider Dr. Shelda Bruckner    Encounter Date: 12/27/2023  Check In:  Session Check In - 12/27/23 0909       Check-In   Supervising physician immediately available to respond to emergencies See telemetry face sheet for immediately available ER MD    Location [facility]-Cardiac & Pulmonary Rehab    Staff Present Burnard Davenport RN,BSN,MPA;Laura Cates RN,BSN;Joseph Rolinda RCP,RRT,BSRT;Maxon Conetta BS, Exercise Physiologist    Virtual Visit No    Medication changes reported     No    Fall or balance concerns reported    No    Tobacco Cessation No Change    Warm-up and Cool-down Performed on first and last piece of equipment    Resistance Training Performed Yes    VAD Patient? No    PAD/SET Patient? No      Pain Assessment   Currently in Pain? No/denies             Social History   Tobacco Use  Smoking Status Former   Current packs/day: 0.00   Average packs/day: 1 pack/day for 32.5 years (32.5 ttl pk-yrs)   Types: Cigarettes   Start date: 15   Quit date: 09/23/2006   Years since quitting: 17.2  Smokeless Tobacco Never    Goals Met:  Independence with exercise equipment Exercise tolerated well No report of concerns or symptoms today Strength training completed today  Goals Unmet:  Not Applicable  Comments: Pt able to follow exercise prescription today without complaint.  Will continue to monitor for progression.    Dr. Oneil Pinal is Medical Director for [facility] Cardiac Rehabilitation.  Dr. Fuad Aleskerov is Medical Director for [facility] Pulmonary Rehabilitation.

## 2023-12-30 ENCOUNTER — Encounter

## 2023-12-30 DIAGNOSIS — Z951 Presence of aortocoronary bypass graft: Secondary | ICD-10-CM

## 2023-12-30 NOTE — Progress Notes (Signed)
 Daily Session Note  Patient Details  Name: Allen Tapia MRN: [id number] Date of Birth: 02/14/1956 Referring Provider:   Flowsheet Row Cardiac Rehab from 10/30/2023 in [facility] Cardiac and Pulmonary Rehab  Referring Provider Dr. Shelda Bruckner    Encounter Date: 12/30/2023  Check In:  Session Check In - 12/30/23 1101       Check-In   Supervising physician immediately available to respond to emergencies See telemetry face sheet for immediately available ER MD    Location [facility]-Cardiac & Pulmonary Rehab    Staff Present Burnard Davenport RN,BSN,MPA;Joseph Rolinda RCP,RRT,BSRT;Laura Cates RN,BSN;Farley Crooker [place] BS, ACSM CEP, Exercise Physiologist    Virtual Visit No    Medication changes reported     No    Fall or balance concerns reported    No    Tobacco Cessation No Change    Warm-up and Cool-down Performed on first and last piece of equipment    Resistance Training Performed Yes    VAD Patient? No    PAD/SET Patient? No      Pain Assessment   Currently in Pain? No/denies             Social History   Tobacco Use  Smoking Status Former   Current packs/day: 0.00   Average packs/day: 1 pack/day for 32.5 years (32.5 ttl pk-yrs)   Types: Cigarettes   Start date: 72   Quit date: 09/23/2006   Years since quitting: 17.2  Smokeless Tobacco Never    Goals Met:  Independence with exercise equipment Exercise tolerated well No report of concerns or symptoms today Strength training completed today  Goals Unmet:  Not Applicable  Comments: Pt able to follow exercise prescription today without complaint.  Will continue to monitor for progression.    Dr. Oneil Pinal is Medical Director for [facility] Cardiac Rehabilitation.  Dr. Fuad Aleskerov is Medical Director for [facility] Pulmonary Rehabilitation.

## 2024-01-01 ENCOUNTER — Encounter

## 2024-01-01 DIAGNOSIS — Z951 Presence of aortocoronary bypass graft: Secondary | ICD-10-CM

## 2024-01-01 NOTE — Progress Notes (Signed)
 Daily Session Note  Patient Details  Name: Allen Tapia MRN: [id number] Date of Birth: 11-26-1955 Referring Provider:   Flowsheet Row Cardiac Rehab from 10/30/2023 in [facility] Cardiac and Pulmonary Rehab  Referring Provider Dr. Shelda Bruckner    Encounter Date: 01/01/2024  Check In:  Session Check In - 01/01/24 1058       Check-In   Supervising physician immediately available to respond to emergencies See telemetry face sheet for immediately available ER MD    Location [facility]-Cardiac & Pulmonary Rehab    Staff Present Burnard Davenport [facility]gi, RN, DNP, NE-BC;Joseph [facility] RN,BSN;Margaret Best, MS, Exercise Physiologist;Mikiah Durall Dyane BS, ACSM CEP, Exercise Physiologist    Virtual Visit No    Medication changes reported     No    Fall or balance concerns reported    No    Tobacco Cessation No Change    Warm-up and Cool-down Performed on first and last piece of equipment    Resistance Training Performed Yes    VAD Patient? No    PAD/SET Patient? No      Pain Assessment   Currently in Pain? No/denies             Social History   Tobacco Use  Smoking Status Former   Current packs/day: 0.00   Average packs/day: 1 pack/day for 32.5 years (32.5 ttl pk-yrs)   Types: Cigarettes   Start date: 49   Quit date: 09/23/2006   Years since quitting: 17.2  Smokeless Tobacco Never    Goals Met:  Independence with exercise equipment Exercise tolerated well No report of concerns or symptoms today Strength training completed today  Goals Unmet:  Not Applicable  Comments: Pt able to follow exercise prescription today without complaint.  Will continue to monitor for progression.    Dr. Oneil Pinal is Medical Director for [facility] Cardiac Rehabilitation.  Dr. Fuad Aleskerov is Medical Director for [facility] Pulmonary Rehabilitation.

## 2024-01-03 ENCOUNTER — Encounter: Admitting: *Deleted

## 2024-01-03 DIAGNOSIS — Z951 Presence of aortocoronary bypass graft: Secondary | ICD-10-CM

## 2024-01-03 NOTE — Progress Notes (Signed)
 Daily Session Note  Patient Details  Name: RUDELL ORTMAN MRN: [id number] Date of Birth: [birth year] Referring Provider:   Flowsheet Row Cardiac Rehab from 10/30/2023 in [facility] Cardiac and Pulmonary Rehab  Referring Provider Dr. Shelda Bruckner    Encounter Date: 01/03/2024  Check In:  Session Check In - 01/03/24 1112       Check-In   Supervising physician immediately available to respond to emergencies See telemetry face sheet for immediately available ER MD    Location [facility]-Cardiac & Pulmonary Rehab    Staff Present Ronal Picket, RN, DNP, NE-BC;Noah Tickle, BS, Exercise Physiologist;Tanith Dagostino RN,BSN;Maxon Conetta BS, Exercise Physiologist    Virtual Visit No    Medication changes reported     No    Fall or balance concerns reported    No    Warm-up and Cool-down Performed on first and last piece of equipment    Resistance Training Performed Yes    VAD Patient? No    PAD/SET Patient? No      Pain Assessment   Currently in Pain? No/denies             Social History   Tobacco Use  Smoking Status Former   Current packs/day: 0.00   Average packs/day: 1 pack/day for 32.5 years (32.5 ttl pk-yrs)   Types: Cigarettes   Start date: 31   Quit date: 09/23/2006   Years since quitting: 17.2  Smokeless Tobacco Never    Goals Met:  Independence with exercise equipment Exercise tolerated well No report of concerns or symptoms today Strength training completed today  Goals Unmet:  Not Applicable  Comments: Pt able to follow exercise prescription today without complaint.  Will continue to monitor for progression.    Dr. Oneil Pinal is Medical Director for [facility] Cardiac Rehabilitation.  Dr. Fuad Aleskerov is Medical Director for [facility] Pulmonary Rehabilitation.

## 2024-01-06 ENCOUNTER — Encounter

## 2024-01-06 DIAGNOSIS — Z951 Presence of aortocoronary bypass graft: Secondary | ICD-10-CM

## 2024-01-06 NOTE — Progress Notes (Signed)
 Daily Session Note  Patient Details  Name: Allen Tapia MRN: [id number] Date of Birth: 1955/08/18 Referring Provider:   Flowsheet Row Cardiac Rehab from 10/30/2023 in [facility] Cardiac and Pulmonary Rehab  Referring Provider Dr. Shelda Bruckner    Encounter Date: 01/06/2024  Check In:  Session Check In - 01/06/24 1111       Check-In   Supervising physician immediately available to respond to emergencies See telemetry face sheet for immediately available ER MD    Location [facility]-Cardiac & Pulmonary Rehab    Staff Present Burnard Davenport RN,BSN,MPA;Joseph Rolinda RCP,RRT,BSRT;Laura Cates RN,BSN;Laelle Bridgett Dyane BS, ACSM CEP, Exercise Physiologist    Virtual Visit No    Medication changes reported     No    Fall or balance concerns reported    No    Tobacco Cessation No Change    Warm-up and Cool-down Performed on first and last piece of equipment    Resistance Training Performed Yes    VAD Patient? No    PAD/SET Patient? No      Pain Assessment   Currently in Pain? No/denies             Social History   Tobacco Use  Smoking Status Former   Current packs/day: 0.00   Average packs/day: 1 pack/day for 32.5 years (32.5 ttl pk-yrs)   Types: Cigarettes   Start date: 50   Quit date: 09/23/2006   Years since quitting: 17.2  Smokeless Tobacco Never    Goals Met:  Independence with exercise equipment Exercise tolerated well No report of concerns or symptoms today Strength training completed today  Goals Unmet:  Not Applicable  Comments: Pt able to follow exercise prescription today without complaint.  Will continue to monitor for progression.    Dr. Oneil Pinal is Medical Director for [facility] Cardiac Rehabilitation.  Dr. Fuad Aleskerov is Medical Director for [facility] Pulmonary Rehabilitation.

## 2024-01-08 ENCOUNTER — Encounter

## 2024-01-10 ENCOUNTER — Encounter

## 2024-01-10 DIAGNOSIS — Z951 Presence of aortocoronary bypass graft: Secondary | ICD-10-CM | POA: Diagnosis not present

## 2024-01-10 NOTE — Progress Notes (Signed)
 Daily Session Note  Patient Details  Name: Allen Tapia MRN: [id number] Date of Birth: 1955/10/03 Referring Provider:   Flowsheet Row Cardiac Rehab from 10/30/2023 in [facility] Cardiac and Pulmonary Rehab  Referring Provider Dr. Shelda Bruckner    Encounter Date: 01/10/2024  Check In:  Session Check In - 01/10/24 1054       Check-In   Supervising physician immediately available to respond to emergencies See telemetry face sheet for immediately available ER MD    Location [facility]-Cardiac & Pulmonary Rehab    Staff Present Burnard Davenport RN,BSN,MPA;Maxon Conetta BS, Exercise Physiologist;Noah Tickle, BS, Exercise Physiologist;Laureen Delores, BS, RRT, CPFT    Virtual Visit No    Medication changes reported     No    Fall or balance concerns reported    No    Tobacco Cessation No Change    Warm-up and Cool-down Performed on first and last piece of equipment    Resistance Training Performed Yes    VAD Patient? No    PAD/SET Patient? No      Pain Assessment   Currently in Pain? No/denies             Social History   Tobacco Use  Smoking Status Former   Current packs/day: 0.00   Average packs/day: 1 pack/day for 32.5 years (32.5 ttl pk-yrs)   Types: Cigarettes   Start date: 28   Quit date: 09/23/2006   Years since quitting: 17.3  Smokeless Tobacco Never    Goals Met:  Independence with exercise equipment Exercise tolerated well No report of concerns or symptoms today Strength training completed today  Goals Unmet:  Not Applicable  Comments: Pt able to follow exercise prescription today without complaint.  Will continue to monitor for progression.    Dr. Oneil Pinal is Medical Director for [facility] Cardiac Rehabilitation.  Dr. Fuad Aleskerov is Medical Director for [facility] Pulmonary Rehabilitation.

## 2024-01-13 ENCOUNTER — Encounter: Admitting: Emergency Medicine

## 2024-01-13 ENCOUNTER — Encounter (HOSPITAL_BASED_OUTPATIENT_CLINIC_OR_DEPARTMENT_OTHER): Payer: Self-pay | Admitting: Cardiology

## 2024-01-13 ENCOUNTER — Ambulatory Visit (INDEPENDENT_AMBULATORY_CARE_PROVIDER_SITE_OTHER): Admitting: Cardiology

## 2024-01-13 ENCOUNTER — Encounter

## 2024-01-13 VITALS — BP 128/72 | HR 55 | Ht 72.0 in | Wt 207.1 lb

## 2024-01-13 DIAGNOSIS — I77819 Aortic ectasia, unspecified site: Secondary | ICD-10-CM

## 2024-01-13 DIAGNOSIS — I2510 Atherosclerotic heart disease of native coronary artery without angina pectoris: Secondary | ICD-10-CM | POA: Diagnosis not present

## 2024-01-13 DIAGNOSIS — I9789 Other postprocedural complications and disorders of the circulatory system, not elsewhere classified: Secondary | ICD-10-CM | POA: Diagnosis not present

## 2024-01-13 DIAGNOSIS — Z951 Presence of aortocoronary bypass graft: Secondary | ICD-10-CM

## 2024-01-13 DIAGNOSIS — Z9889 Other specified postprocedural states: Secondary | ICD-10-CM

## 2024-01-13 DIAGNOSIS — I4891 Unspecified atrial fibrillation: Secondary | ICD-10-CM

## 2024-01-13 NOTE — Progress Notes (Signed)
 Cardiology Office Note:  .    Date:  01/13/2024  ID:  Allen Tapia, DOB [birth year], MRN [id number] PCP: Diedra Lame, MD  [facility] Providers Cardiologist:  Shelda Bruckner, MD Cardiology APP:  Percy Rosaline HERO, NP     History of Present Illness: .    Allen Tapia is a 68 y.o. male with a hx of coronary disease s/p 2V CABG 08/2023, multiple sclerosis who is seen for follow up. I initially met him 05/04/19 as a new consult at the request of Diedra Lame, MD for the evaluation and management of coronary artery disease.   Cardiac history: Cardiac cath ([facility]) 07/14/2007: obstructive LAD disease only, not intervened on as there was no matching ischemia on stress testing/atypical symptoms, EF 65%. Other vessels with nonobstructive lesions  Had been having chest pain, underwent cardiac PET 05/2023 with small area of apical ischemia. Given symptoms, proceeded with cardiac cath 07/26/2023 which showed 90% mid LAD, 70% D1, 70% D2, 20% pRCA. Lesion in pLAD and D1 was complex, calcified and severely stenosed with ostial D1 disease. Recommended for CABG. Underwent LIMA-LAD and SVG-D1 on 09/16/23 with Dr. Shyrl.  FH: brother had an MI at age 68 resulting in CABG, with multiple cardiac arrests, ultimately requiring ECMO and impella. Hospitalized for a total of 65 days.   Today: Reviewed his hospital course from CABG in 08/2023. He did have post op afib on day 2 and was placed on amiodarone  and apixaban . Had post op wound issues at upper chest and right groin.  He is about halfway through cardiac rehab. He feels like his upper body is strong but his lower body is weak.   He would like to stop the amiodarone , apixaban , and metoprolol . We discussed today. He has not felt atrial fibrillation. Bruises easily on apixaban  but no severe bleeding. We discussed timeline for these meds, see AVS.   Discussed cholesterol goals, statin, see below.  ROS:    Studies Reviewed: SABRA          Physical Exam:    VS:  BP 128/72   Pulse (!) 55   Ht 6' (1.829 m)   Wt 207 lb 1.6 oz (93.9 kg)   SpO2 95%   BMI 28.09 kg/m    Wt Readings from Last 3 Encounters:  01/13/24 207 lb 1.6 oz (93.9 kg)  11/15/23 202 lb 8 oz (91.9 kg)  10/30/23 202 lb 8 oz (91.9 kg)    GEN: Well nourished, well developed in no acute distress HEENT: Normal, moist mucous membranes NECK: No JVD CARDIAC: regular rhythm, normal S1 and S2, no rubs or gallops. No murmur. VASCULAR: Radial and DP pulses 2+ bilaterally. No carotid bruits RESPIRATORY:  Clear to auscultation without rales, wheezing or rhonchi  ABDOMEN: Soft, non-tender, non-distended MUSCULOSKELETAL:  Ambulates independently SKIN: Warm and dry, no edema NEUROLOGIC:  Alert and oriented x 3. No focal neuro deficits noted. PSYCHIATRIC:  Normal affect   ASSESSMENT AND PLAN: .    Coronary artery disease with complex LAD-D1 disease, s/p 2V CABG 09/16/23 (LIMA-LAD, SVG-D1) by Dr. Shyrl Hypercholesterolemia Family history of heart disease -on aspirin  81 mg daily and apixaban  (see below) -Has been counseled on adderall and NSAIDs with CAD. His quality of life on adderall is much better. After shared decision making, he will continue adderall, we will watch his HR/BP and watch for afib and treat these as needed -on low dose metoprolol , but feels weak. Will trial stopping, see below -discussed red flag symptoms that  need immediate medical attention -tolerating rosuvastatin  40 mg daily. He asked about decreasing back to 20 mg dose, but after shared decision making and discussing reasons to be more aggressive, we will continue 40 mg dose  -lpa 64 (normal) -lipids 12/31/23 show LDL 42, HDL 52, TG 67, at goal  Post op atrial fibrillation -initially orally loaded and then IV amiodarone  used after rapid event occurrred -CHA2DS2/VAS Stroke Risk Points=3  -placed on amiodarone  oral -we discussed at length today. After shared decision making, will start by  stopping amiodarone . If no afib symptoms in a week, will taper metoprolol  -we discussed that if afib recurs, it will be better to be on DOAC so that he can be cardioverted. Also discussed PRN metoprolol  for fast heart rates -discussed Kardia mobile for monitoring -if no afib at follow up, we will discuss pros/cons of anticoagulation. I did discuss risk of recurrent afib and risk of CVA, but he would like to minimize medications if possible   Ascending aortic dilation History of AAA repair -we discussed data for beta blockers and ACEI/ARB, but limited by fatigue and blood pressure -has follow up imaging scheduled  Cardiac risk counseling and prevention recommendations: -recommend heart healthy/Mediterranean diet, with whole grains, fruits, vegetable, fish, lean meats, nuts, and olive oil. Limit salt. -recommend moderate walking, 3-5 times/week for 30-50 minutes each session. Aim for at least 150 minutes.week. Goal should be pace of 3 miles/hours, or walking 1.5 miles in 30 minutes -recommend avoidance of tobacco products. Avoid excess alcohol .  Dispo: Follow-up in 2 mos or sooner as needed  Total time of encounter: I spent 43 minutes dedicated to the care of this patient on the date of this encounter to include pre-visit review of records, face-to-face time with the patient discussing conditions above, and clinical documentation with the electronic health record. We specifically spent time today discussing CABG surgery, medications, atrial fibrillation, aortic dilation as above   Signed, Shelda Bruckner, MD

## 2024-01-13 NOTE — Progress Notes (Signed)
 Daily Session Note  Patient Details  Name: JAKOLBY SEDIVY MRN: [id number] Date of Birth: 04-Aug-1955 Referring Provider:   Flowsheet Row Cardiac Rehab from 10/30/2023 in [facility] Cardiac and Pulmonary Rehab  Referring Provider Dr. Shelda Bruckner    Encounter Date: 01/13/2024  Check In:  Session Check In - 01/13/24 1523       Check-In   Supervising physician immediately available to respond to emergencies See telemetry face sheet for immediately available ER MD    Location [facility]-Cardiac & Pulmonary Rehab    Staff Present Leita Franks RN,BSN;Joseph [facility] BS, Exercise Physiologist;Margaret Best, MS, Exercise Physiologist    Virtual Visit No    Medication changes reported     No    Fall or balance concerns reported    No    Tobacco Cessation No Change    Warm-up and Cool-down Performed on first and last piece of equipment    Resistance Training Performed Yes    VAD Patient? No    PAD/SET Patient? No      Pain Assessment   Currently in Pain? No/denies             Social History   Tobacco Use  Smoking Status Former   Current packs/day: 0.00   Average packs/day: 1 pack/day for 32.5 years (32.5 ttl pk-yrs)   Types: Cigarettes   Start date: 54   Quit date: 09/23/2006   Years since quitting: 17.3  Smokeless Tobacco Never    Goals Met:  Independence with exercise equipment Exercise tolerated well No report of concerns or symptoms today Strength training completed today  Goals Unmet:  Not Applicable  Comments: Pt able to follow exercise prescription today without complaint.  Will continue to monitor for progression.    Dr. Oneil Pinal is Medical Director for [facility] Cardiac Rehabilitation.  Dr. Fuad Aleskerov is Medical Director for [facility] Pulmonary Rehabilitation.

## 2024-01-13 NOTE — Patient Instructions (Addendum)
 Stop amiodarone  (doesn't need to be tapered).  A week after stopping the amiodarone , if you haven't felt palpitations/racing beats, you can taper off of the metoprolol . You are on a low dose, so you can start by taking only 12.5 mg metoprolol  in the morning (don't take at night) for three days, then stop.  Keep any remaining metoprolol  that if your heart rate races (over 110 sitting still), you can take 12.5 mg metoprolol  as needed every 6 hours to manage the heart rate. Then call us  and let us  know you've had these episodes so we can discuss next steps.  For right now, continue the apixaban . This way if you go into afib and stay there, we can cardiovert you. If at follow up you haven't had afib off the amiodarone  and metoprolol , we can talk about options for blood thinner.  Look into Kardia mobile to check rhythm.    Schedule a follow up appointment for in about 2 months with Dr. Lonni, [name], NP or Rosaline Bane, NP

## 2024-01-14 ENCOUNTER — Other Ambulatory Visit: Payer: Self-pay

## 2024-01-15 ENCOUNTER — Encounter: Payer: Self-pay | Admitting: *Deleted

## 2024-01-15 ENCOUNTER — Encounter: Admitting: Emergency Medicine

## 2024-01-15 DIAGNOSIS — Z951 Presence of aortocoronary bypass graft: Secondary | ICD-10-CM

## 2024-01-15 NOTE — Progress Notes (Addendum)
 Cardiac Individual Treatment Plan  Patient Details  Name: Allen Tapia MRN: [id number] Date of Birth: 09/30/1955 Referring Provider:   Flowsheet Row Cardiac Rehab from 10/30/2023 in [facility] Cardiac and Pulmonary Rehab  Referring Provider Dr. Shelda Bruckner    Initial Encounter Date:  Flowsheet Row Cardiac Rehab from 10/30/2023 in [facility] Cardiac and Pulmonary Rehab  Date 10/30/23    Visit Diagnosis: S/P CABG x 2  Patient's Home Medications on Admission:  Current Outpatient Medications:    acetaminophen  (TYLENOL ) 500 MG tablet, Take 2 tablets (1,000 mg total) by mouth every 6 (six) hours as needed for mild pain., Disp: 60 tablet, Rfl: 0   amphetamine -dextroamphetamine (ADDERALL XR) 25 MG 24 hr capsule, Take 25 mg by mouth daily as needed (focus)., Disp: , Rfl:    apixaban  (ELIQUIS ) 5 MG TABS tablet, Take 1 tablet (5 mg total) by mouth 2 (two) times daily., Disp: 180 tablet, Rfl: 0   aspirin  EC 81 MG tablet, Take 81 mg by mouth daily. Swallow whole., Disp: , Rfl:    baclofen  (LIORESAL ) 20 MG tablet, Take 30 mg by mouth 3 (three) times daily., Disp: , Rfl:    BREZTRI  AEROSPHERE 160-9-4.8 MCG/ACT AERO, USE 2 INHALATIONS IN THE MORNING AND AT BEDTIME, Disp: 32.1 g, Rfl: 3   buPROPion  (WELLBUTRIN  XL) 150 MG 24 hr tablet, Take 150 mg by mouth daily., Disp: , Rfl:    gabapentin  (NEURONTIN ) 300 MG capsule, Take 1-2 capsules (300-600 mg total) by mouth 2 (two) times daily. 600 mg in am and 300 mg in afternoon, Disp: 60 capsule, Rfl: 0   metoprolol  tartrate (LOPRESSOR ) 25 MG tablet, Take 0.5 tablets (12.5 mg total) by mouth 2 (two) times daily., Disp: 90 tablet, Rfl: 0   montelukast  (SINGULAIR ) 10 MG tablet, TAKE 1 TABLET AT BEDTIME, Disp: 90 tablet, Rfl: 3   Multiple Vitamins-Minerals (MULTIVITAMIN WITH MINERALS) tablet, Take 1 tablet by mouth daily., Disp: , Rfl:    Ofatumumab (KESIMPTA) 20 MG/0.4ML SOAJ, Inject 20 mg into the skin every 30 (thirty) days. 1st day of every month, Disp: , Rfl:     pantoprazole  (PROTONIX ) 20 MG tablet, Take 20 mg by mouth at bedtime., Disp: , Rfl:    Polyethyl Glycol-Propyl Glycol (SYSTANE) 0.4-0.3 % SOLN, Place 1 drop into both eyes 4 (four) times daily as needed (dry eyes). (Patient not taking: Reported on 01/13/2024), Disp: , Rfl:    rosuvastatin  (CRESTOR ) 40 MG tablet, Take 40 mg by mouth at bedtime., Disp: , Rfl:    Spacer/Aero-Holding Chambers (AEROCHAMBER MV) inhaler, Use as instructed, Disp: 1 each, Rfl: 0   tamsulosin  (FLOMAX ) 0.4 MG CAPS capsule, TAKE 1 CAPSULE DAILY, Disp: 90 capsule, Rfl: 3   traZODone  (DESYREL ) 100 MG tablet, Take 100 mg by mouth at bedtime., Disp: , Rfl:    VENTOLIN  HFA 108 (90 Base) MCG/ACT inhaler, Inhale 2 puffs into the lungs every 4 (four) hours as needed for wheezing or shortness of breath., Disp: 3 each, Rfl: 4   Vibegron  (GEMTESA ) 75 MG TABS, Take 1 tablet (75 mg total) by mouth daily., Disp: 90 tablet, Rfl: 3   VITAMIN D  PO, Take 1 capsule by mouth daily., Disp: , Rfl:   Past Medical History: Past Medical History:  Diagnosis Date   Anginal pain    atypical chest pain cardiac cath performed at that time   Arthritis    Asthma    Atypical chest pain    Barrett's esophagus    Bilateral cataracts    BPH (benign  prostatic hyperplasia)    Cancer (HCC)    skin ca in face and hand   Chicken pox    Chronic constipation    no current problem per pt 09/12/23   COPD (chronic obstructive pulmonary disease) (HCC)    Coronary artery disease 07/14/2007   a.) LHC 07/14/2007: 40% pRCA-1, 40% pRCA-2, 40% dRCA, 20% RPL1, 30% LM, 20% mLCx-1, 40% mLCx-2, 20% OM3-1, 20% OM3-2, 20% pLAD, 40% mLAD-1, 70% mLAD-2, 40% mLAD-3, 30% dLAD, 40% D1, 40% D2 --> med mgmt.   Depression    Foreign body (FB) in soft tissue    Former tobacco use    GERD (gastroesophageal reflux disease)    History of 2019 novel coronavirus disease (COVID-19) 04/07/2019   a.) tested (+) at [facility] 04/16/2019, 05/30/2021; b.) 2 other (+) tests at home  (dates unknown)   History of kidney stones    Hyperlipidemia    Hyperplastic colon polyp    Hypertension    Infrarenal abdominal aortic aneurysm (AAA) without rupture    a.) MR lumbar spine 12/30/2020: infrarenal saccular AAA measuring 2.2 cm; b.) CTA A/P 01/05/2021: measured 2.6 cm; c.) s/p EVAR 03/03/2021   Ischemic heart disease 12/20/2010   Long term current use of immunosuppressive drug    a.) on ofatumumab for MS   MS (multiple sclerosis)    a.) on ofatumumab   Neuromuscular disorder (HCC)    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 - takes gabapentin    Pneumonia    x several   Pre-diabetes    Shingles    Tubular adenoma of colon 08/23/2016    Tobacco Use: Social History   Tobacco Use  Smoking Status Former   Current packs/day: 0.00   Average packs/day: 1 pack/day for 32.5 years (32.5 ttl pk-yrs)   Types: Cigarettes   Start date: 65   Quit date: 09/23/2006   Years since quitting: 17.3  Smokeless Tobacco Never    Labs: Review Flowsheet  More data may exist      Latest Ref Rng & Units 02/28/2021 06/06/2022 09/12/2023 09/16/2023 09/17/2023  Labs for ITP Cardiac and Pulmonary Rehab  Cholestrol 0 - 200 mg/dL - - - - 56   LDL (calc) 0 - 99 mg/dL - - - - 19   HDL-C >59 mg/dL - - - - 29   Trlycerides <150 mg/dL - - - - 40   Hemoglobin A1c 4.8 - 5.6 % - 6.1  5.8  - 5.9   PH, Arterial 7.35 - 7.45 7.383  - - 7.309  7.284  7.358  7.345  7.406  7.394  7.335  -  PCO2 arterial 32 - 48 mmHg 44.2  - - 40.9  48.1  41.1  42.9  39.4  39.5  42.9  -  Bicarbonate 20.0 - 28.0 mmol/L 25.7  - - 20.6  23.0  23.1  23.4  24.7  24.4  24.2  22.9  -  TCO2 22 - 32 mmol/L - - - 22  24  24  21  25  26  24  26  25  24  22  24  26   -  Acid-base deficit 0.0 - 2.0 mmol/L - - - 5.0  4.0  2.0  2.0  1.0  1.0  3.0  -  O2 Saturation % 97.1  - - 96  100  99  95  100  76  100  100  -    Details       Multiple values from one day  are sorted in reverse-chronological order          Exercise Target Goals: Exercise Program  Goal: Individual exercise prescription set using results from initial 6 min walk test and THRR while considering  patient's activity barriers and safety.   Exercise Prescription Goal: Initial exercise prescription builds to 30-45 minutes a day of aerobic activity, 2-3 days per week.  Home exercise guidelines will be given to patient during program as part of exercise prescription that the participant will acknowledge.   Education: Aerobic Exercise: - Group verbal and visual presentation on the components of exercise prescription. Introduces F.I.T.T principle from ACSM for exercise prescriptions.  Reviews F.I.T.T. principles of aerobic exercise including progression. Written material provided at class time.   Education: Resistance Exercise: - Group verbal and visual presentation on the components of exercise prescription. Introduces F.I.T.T principle from ACSM for exercise prescriptions  Reviews F.I.T.T. principles of resistance exercise including progression. Written material provided at class time. Flowsheet Row Cardiac Rehab from 01/15/2024 in [facility] Cardiac and Pulmonary Rehab  Date 01/01/24  Educator nt  Instruction Review Code 1- TEFL teacher Understanding     Education: Exercise & Equipment Safety: - Individual verbal instruction and demonstration of equipment use and safety with use of the equipment. Flowsheet Row Cardiac Rehab from 01/15/2024 in [facility] Cardiac and Pulmonary Rehab  Date 10/30/23  Educator [facility]  Instruction Review Code 1- Verbalizes Understanding    Education: Exercise Physiology & General Exercise Guidelines: - Group verbal and written instruction with models to review the exercise physiology of the cardiovascular system and associated critical values. Provides general exercise guidelines with specific guidelines to those with heart or lung disease. Written material provided at class time. Flowsheet Row Cardiac Rehab from 01/15/2024 in [facility] Cardiac and Pulmonary Rehab   Date 12/25/23  Educator nt  Instruction Review Code 1- TEFL teacher Understanding    Education: Flexibility, Balance, Mind/Body Relaxation: - Group verbal and visual presentation with interactive activity on the components of exercise prescription. Introduces F.I.T.T principle from ACSM for exercise prescriptions. Reviews F.I.T.T. principles of flexibility and balance exercise training including progression. Also discusses the mind body connection.  Reviews various relaxation techniques to help reduce and manage stress (i.e. Deep breathing, progressive muscle relaxation, and visualization). Balance handout provided to take home. Written material provided at class time. Flowsheet Row Cardiac Rehab from 01/15/2024 in [facility] Cardiac and Pulmonary Rehab  Date 01/01/24  Educator nt  Instruction Review Code 1- Verbalizes Understanding    Activity Barriers & Risk Stratification:  Activity Barriers & Cardiac Risk Stratification - 10/30/23 1059       Activity Barriers & Cardiac Risk Stratification   Activity Barriers Arthritis;Right Knee Replacement;Left Knee Replacement;Joint Problems;Other (comment)    Comments hx of arthritis in R hand, elbow; also has MS but generally has no pain    Cardiac Risk Stratification High          6 Minute Walk:  6 Minute Walk     Row Name 10/30/23 1057         6 Minute Walk   Phase Initial     Distance 1395 feet     Walk Time 6 minutes     # of Rest Breaks 0     MPH 2.64     METS 3.48     RPE 11     Perceived Dyspnea  1     VO2 Peak 12.2     Symptoms No     Resting HR 59 bpm  Resting BP 124/62     Resting Oxygen Saturation  93 %     Exercise Oxygen Saturation  during 6 min walk 97 %     Max Ex. HR 115 bpm     Max Ex. BP 136/62     2 Minute Post BP 128/64        Oxygen Initial Assessment:   Oxygen Re-Evaluation:   Oxygen Discharge (Final Oxygen Re-Evaluation):   Initial Exercise Prescription:  Initial Exercise Prescription -  10/30/23 1000       Date of Initial Exercise RX and Referring Provider   Date 10/30/23    Referring Provider Dr. Shelda Bruckner      Oxygen   Maintain Oxygen Saturation 88% or higher      Treadmill   MPH 2.5    Grade 0    Minutes 15    METs 2.91      Recumbant Bike   Level 2    RPM 50    Watts 25    Minutes 15    METs 3.5      NuStep   Level 2    SPM 80    Minutes 15    METs 3.5      T5 Nustep   Level 2    SPM 80    Minutes 15    METs 3.5      Biostep-RELP   Level 2    SPM 50    Minutes 15    METs 3.5      Track   Laps 35    Minutes 15    METs 2.9      Prescription Details   Duration Progress to 30 minutes of continuous aerobic without signs/symptoms of physical distress      Intensity   THRR 40-80% of Max Heartrate 96-133    Ratings of Perceived Exertion 11-13    Perceived Dyspnea 0-4      Progression   Progression Continue to progress workloads to maintain intensity without signs/symptoms of physical distress.      Resistance Training   Training Prescription Yes    Weight 5lb    Reps 10-15          Perform Capillary Blood Glucose checks as needed.  Exercise Prescription Changes:   Exercise Prescription Changes     Row Name 10/30/23 1100 11/21/23 1600 12/03/23 1300 01/02/24 1600       Response to Exercise   Blood Pressure (Admit) 124/62 102/54 112/62 104/66    Blood Pressure (Exercise) 136/62 126/52 128/70 112/60    Blood Pressure (Exit) 128/64 102/54 102/58 106/66    Heart Rate (Admit) 59 bpm 58 bpm 53 bpm 58 bpm    Heart Rate (Exercise) 115 bpm 97 bpm 99 bpm 99 bpm    Heart Rate (Exit) 60 bpm 68 bpm 57 bpm 59 bpm    Oxygen Saturation (Admit) 93 % -- -- --    Oxygen Saturation (Exercise) 97 % -- -- --    Oxygen Saturation (Exit) 97 % -- -- --    Rating of Perceived Exertion (Exercise) 11 13 13 14     Perceived Dyspnea (Exercise) 1 -- -- --    Symptoms none none none none    Comments results First three days of  exercise -- --    Duration -- Continue with 30 min of aerobic exercise without signs/symptoms of physical distress. Continue with 30 min of aerobic exercise without signs/symptoms of physical distress. Continue with 30  min of aerobic exercise without signs/symptoms of physical distress.    Intensity -- THRR unchanged THRR unchanged THRR unchanged      Progression   Progression -- Continue to progress workloads to maintain intensity without signs/symptoms of physical distress. Continue to progress workloads to maintain intensity without signs/symptoms of physical distress. Continue to progress workloads to maintain intensity without signs/symptoms of physical distress.    Average METs -- 2.82 3.09 3.35      Resistance Training   Training Prescription -- Yes Yes Yes    Weight -- 5 lb 5 lb 5 lb    Reps -- 10-15 10-15 10-15      Interval Training   Interval Training -- No No No      Treadmill   MPH -- 2.5 2.5 2.5    Grade -- 0 0 1    Minutes -- 15 15 15     METs -- 2.91 2.91 3.26      Recumbant Bike   Level -- 3 5 5     Watts -- 29 43 40    Minutes -- 15 15 15     METs -- 2.86 3.47 3.49      NuStep   Level -- 2  T6 nustep 5  T6 nustep 7    Minutes -- 15 15 15     METs -- 2.3 3.5 5.5      T5 Nustep   Level -- -- -- 6    Minutes -- -- -- 15    METs -- -- -- 3.1      Biostep-RELP   Level -- 1 -- --    Minutes -- 15 -- --    METs -- 3 -- --      Oxygen   Maintain Oxygen Saturation -- 88% or higher 88% or higher 88% or higher       Exercise Comments:   Exercise Comments     Row Name 11/08/23 1110           Exercise Comments First full day of exercise!  Patient was oriented to gym and equipment including functions, settings, policies, and procedures.  Patient's individual exercise prescription and treatment plan were reviewed.  All starting workloads were established based on the results of the 6 minute walk test done at initial orientation visit.  The plan for exercise  progression was also introduced and progression will be customized based on patient's performance and goals.          Exercise Goals and Review:   Exercise Goals     Row Name 10/30/23 1104             Exercise Goals   Increase Physical Activity Yes       Intervention Develop an individualized exercise prescription for aerobic and resistive training based on initial evaluation findings, risk stratification, comorbidities and participant's personal goals.;Provide advice, education, support and counseling about physical activity/exercise needs.       Expected Outcomes Short Term: Attend rehab on a regular basis to increase amount of physical activity.;Long Term: Add in home exercise to make exercise part of routine and to increase amount of physical activity.;Long Term: Exercising regularly at least 3-5 days a week.       Increase Strength and Stamina Yes       Intervention Provide advice, education, support and counseling about physical activity/exercise needs.;Develop an individualized exercise prescription for aerobic and resistive training based on initial evaluation findings, risk stratification, comorbidities and participant's personal goals.  Expected Outcomes Short Term: Increase workloads from initial exercise prescription for resistance, speed, and METs.;Short Term: Perform resistance training exercises routinely during rehab and add in resistance training at home;Long Term: Improve cardiorespiratory fitness, muscular endurance and strength as measured by increased METs and functional capacity ( )       Able to understand and use rate of perceived exertion (RPE) scale Yes       Intervention Provide education and explanation on how to use RPE scale       Expected Outcomes Short Term: Able to use RPE daily in rehab to express subjective intensity level;Long Term:  Able to use RPE to guide intensity level when exercising independently       Able to understand and use Dyspnea scale  Yes       Intervention Provide education and explanation on how to use Dyspnea scale       Expected Outcomes Short Term: Able to use Dyspnea scale daily in rehab to express subjective sense of shortness of breath during exertion;Long Term: Able to use Dyspnea scale to guide intensity level when exercising independently       Knowledge and understanding of Target Heart Rate Range (THRR) Yes       Intervention Provide education and explanation of THRR including how the numbers were predicted and where they are located for reference       Expected Outcomes Short Term: Able to state/look up THRR;Short Term: Able to use daily as guideline for intensity in rehab;Long Term: Able to use THRR to govern intensity when exercising independently       Able to check pulse independently Yes       Intervention Provide education and demonstration on how to check pulse in carotid and radial arteries.;Review the importance of being able to check your own pulse for safety during independent exercise       Expected Outcomes Short Term: Able to explain why pulse checking is important during independent exercise;Long Term: Able to check pulse independently and accurately       Understanding of Exercise Prescription Yes       Intervention Provide education, explanation, and written materials on patient's individual exercise prescription       Expected Outcomes Short Term: Able to explain program exercise prescription;Long Term: Able to explain home exercise prescription to exercise independently          Exercise Goals Re-Evaluation :  Exercise Goals Re-Evaluation     Row Name 11/08/23 1111 11/21/23 1610 12/03/23 1358 12/17/23 1517 12/23/23 1127     Exercise Goal Re-Evaluation   Exercise Goals Review Increase Physical Activity;Able to understand and use rate of perceived exertion (RPE) scale;Knowledge and understanding of Target Heart Rate Range (THRR);Understanding of Exercise Prescription;Increase Strength and  Stamina;Able to understand and use Dyspnea scale;Able to check pulse independently Increase Physical Activity;Increase Strength and Stamina;Understanding of Exercise Prescription Increase Physical Activity;Increase Strength and Stamina;Understanding of Exercise Prescription Increase Physical Activity;Increase Strength and Stamina;Understanding of Exercise Prescription Increase Physical Activity;Able to understand and use rate of perceived exertion (RPE) scale;Knowledge and understanding of Target Heart Rate Range (THRR);Understanding of Exercise Prescription;Increase Strength and Stamina;Able to understand and use Dyspnea scale;Able to check pulse independently   Comments Reviewed RPE and dyspnea scale, THR and program prescription with pt today.  Pt voiced understanding and was given a copy of goals to take home. Allen Tapia is off to a good start in the program. He began using the treadmill at a speed of 2.5 mph with no  incline. He also did well on the recumbent bike at level 3 and the T6 nustep at level 2. We will continue to monitor his progress in the program. Allen Tapia is doing well in rehab. He improved to level 5 on both the T6 nustep and the recumbent bike. He also continues to walk on the treadmill at a speed of 2.5 mph with no incline. We will continue to monitor his progress in the program. Allen Tapia returned to rehab on 12/16/2023 for the first time after not attending since 11/22/2023. We will continue to monitor his progress in the program. Reviewed home exercise with pt today.  Pt plans to walk for exercise.  Reviewed THR, pulse, RPE, sign and symptoms, pulse oximetery and when to call 911 or MD.  Also discussed weather considerations and indoor options.  Pt voiced understanding.   Expected Outcomes Short: Use RPE daily to regulate intensity. Long: Follow program prescription in THR. Short: Continue to follow current exercise prescription. Long: Continue exercise to improve strength and stamina. Short: Begin to  progressively increase treadmill workload. Long: Continue exercise to improve strength and stamina. Short: Return to regular attendance in rehab. Long: Continue exercise to improve strength and stamina. Short: add 1-2 days a week of exercise at home on off days of rehab. Long: maintain independent exercise routine upon graduation from cardiac rehab.    Row Name 01/02/24 1645             Exercise Goal Re-Evaluation   Exercise Goals Review Increase Physical Activity;Understanding of Exercise Prescription;Increase Strength and Stamina       Comments Allen Tapia is doing well in rehab. He increased his workload on the treadmill to a speed of 2.5 mph and incline of 1%. He increased to level 6 on the T5 nustep and level 7 on the T4 nustep. He maintained level 5 on the bike. We will continue to monitor his progress in the program.       Expected Outcomes Short: Continue to progressively increase treadmill and nustep workloads. Long: Continue exercise to improve strength and stamina.          Discharge Exercise Prescription (Final Exercise Prescription Changes):  Exercise Prescription Changes - 01/02/24 1600       Response to Exercise   Blood Pressure (Admit) 104/66    Blood Pressure (Exercise) 112/60    Blood Pressure (Exit) 106/66    Heart Rate (Admit) 58 bpm    Heart Rate (Exercise) 99 bpm    Heart Rate (Exit) 59 bpm    Rating of Perceived Exertion (Exercise) 14    Symptoms none    Duration Continue with 30 min of aerobic exercise without signs/symptoms of physical distress.    Intensity THRR unchanged      Progression   Progression Continue to progress workloads to maintain intensity without signs/symptoms of physical distress.    Average METs 3.35      Resistance Training   Training Prescription Yes    Weight 5 lb    Reps 10-15      Interval Training   Interval Training No      Treadmill   MPH 2.5    Grade 1    Minutes 15    METs 3.26      Recumbant Bike   Level 5    Watts  40    Minutes 15    METs 3.49      NuStep   Level 7    Minutes 15    METs  5.5      T5 Nustep   Level 6    Minutes 15    METs 3.1      Oxygen   Maintain Oxygen Saturation 88% or higher          Nutrition:  Target Goals: Understanding of nutrition guidelines, daily intake of sodium 1500mg , cholesterol 200mg , calories 30% from fat and 7% or less from saturated fats, daily to have 5 or more servings of fruits and vegetables.  Education: Nutrition 1 -Group instruction provided by verbal, written material, interactive activities, discussions, models, and posters to present general guidelines for heart healthy nutrition including macronutrients, label reading, and promoting whole foods over processed counterparts. Education serves as Pensions consultant of discussion of heart healthy eating for all. Written material provided at class time. Flowsheet Row Cardiac Rehab from 01/15/2024 in [facility] Cardiac and Pulmonary Rehab  Date 01/15/24  Educator jg  Instruction Review Code 1- Verbalizes Understanding     Education: Nutrition 2 -Group instruction provided by verbal, written material, interactive activities, discussions, models, and posters to present general guidelines for heart healthy nutrition including sodium, cholesterol, and saturated fat. Providing guidance of habit forming to improve blood pressure, cholesterol, and body weight. Written material provided at class time. Flowsheet Row Cardiac Rehab from 01/15/2024 in [facility] Cardiac and Pulmonary Rehab  Date 11/13/23  Educator jg  Instruction Review Code 1- Verbalizes Understanding      Biometrics:  Pre Biometrics - 10/30/23 1104       Pre Biometrics   Height 6' 0.6 (1.844 m)    Weight 202 lb 8 oz (91.9 kg)    Waist Circumference 41 inches    Hip Circumference 41 inches    Waist to Hip Ratio 1 %    BMI (Calculated) 27.01    Single Leg Stand 14.9 seconds           Nutrition Therapy Plan and Nutrition  Goals:   Nutrition Assessments:  MEDIFICTS Score Key: >=70 Need to make dietary changes  40-70 Heart Healthy Diet <= 40 Therapeutic Level Cholesterol Diet  Flowsheet Row Cardiac Rehab from 10/28/2023 in [facility] Cardiac and Pulmonary Rehab  Picture Your Plate Total Score on Admission 53   Picture Your Plate Scores: <59 Unhealthy dietary pattern with much room for improvement. 41-50 Dietary pattern unlikely to meet recommendations for good health and room for improvement. 51-60 More healthful dietary pattern, with some room for improvement.  >60 Healthy dietary pattern, although there may be some specific behaviors that could be improved.    Nutrition Goals Re-Evaluation:  Nutrition Goals Re-Evaluation     Row Name 12/16/23 1113 01/15/24 1138           Goals   Comment RD deferred RD deferred         Nutrition Goals Discharge (Final Nutrition Goals Re-Evaluation):  Nutrition Goals Re-Evaluation - 01/15/24 1138       Goals   Comment RD deferred          Psychosocial: Target Goals: Acknowledge presence or absence of significant depression and/or stress, maximize coping skills, provide positive support system. Participant is able to verbalize types and ability to use techniques and skills needed for reducing stress and depression.   Education: Stress, Anxiety, and Depression - Group verbal and visual presentation to define topics covered.  Reviews how body is impacted by stress, anxiety, and depression.  Also discusses healthy ways to reduce stress and to treat/manage anxiety and depression. Written material provided at class time.  Education: Sleep Hygiene -Provides group verbal and written instruction about how sleep can affect your health.  Define sleep hygiene, discuss sleep cycles and impact of sleep habits. Review good sleep hygiene tips.   Initial Review & Psychosocial Screening:  Initial Psych Review & Screening - 10/28/23 1412       Initial Review   Current  issues with History of Depression;Current Stress Concerns    Comments Patient stated he has a good support system and currently takes wellbutrin . 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 He stated his wife has been very attentive to his recovery and health. Patient stated that he is now able to drive and that is helping him get back to normal. 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 Overall, patient stated he is doing well and does not have any thoughts or made any attempts to harm himself.      Family Dynamics   Good Support System? Yes    Comments wife is very supportive      Barriers   Psychosocial barriers to participate in program The patient should benefit from training in stress management and relaxation.   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         Quality of Life Scores:   Quality of Life - 10/28/23 1433       Quality of Life   Select Quality of Life      Quality of Life Scores   Health/Function Pre 22.83 %    Socioeconomic Pre 20.3 %    Psych/Spiritual Pre 24.69 %    Family Pre 23.57 %    GLOBAL Pre 26.4 %         Scores of 19 and below usually indicate a poorer quality of life in these areas.  A difference of  2-3 points is a clinically meaningful difference.  A difference of 2-3 points in the total score of the Quality of Life Index has been associated with significant improvement in overall quality of life, self-image, physical symptoms, and general health in studies assessing change in quality of life.  PHQ-9: Review Flowsheet       01/15/2024 12/16/2023 10/30/2023  Depression screen PHQ 2/9  Decreased Interest 1 1 1   Down, Depressed, Hopeless 0 0 1  PHQ - 2 Score 1 1 2   Altered sleeping 1 1 1   Tired, decreased energy 2 3 3   Change in appetite 1  1 2   Feeling bad or failure about yourself  1 0 0  Trouble concentrating 1 0 1  Moving slowly or fidgety/restless 1 1 1   Suicidal thoughts 0 0 0  PHQ-9 Score 8 7 10   Difficult doing work/chores Not difficult at all Not difficult at all Not difficult at all   Interpretation of Total Score  Total Score Depression Severity:  1-4 = Minimal depression, 5-9 = Mild depression, 10-14 = Moderate depression, 15-19 = Moderately severe depression, 20-27 = Severe depression   Psychosocial Evaluation and Intervention:  Psychosocial Evaluation - 10/28/23 1418       Psychosocial Evaluation & Interventions   Interventions Stress management education;Relaxation education;Encouraged to exercise with the program and follow exercise prescription    Comments Patient stated he has a good support system and currently  takes wellbutrin . 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 He stated his wife has been very attentive to his recovery and health. Patient stated that he is now able to drive and that is helping him get back to normal. 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 Overall, patient stated he is doing well and does not have any thoughts or made any attempts to harm himself.    Expected Outcomes ST: Attend cardiac rehab for education and exercise; LT: develop and maintain positive self-care habits    Continue Psychosocial Services  Follow up required by staff          Psychosocial Re-Evaluation:  Psychosocial Re-Evaluation     Row Name 12/16/23 1106 01/15/24 1143           Psychosocial Re-Evaluation   Current issues with Current Sleep Concerns Current Sleep Concerns      Comments Allen Tapia is doing well and states he has no stress concerns at this time. He is sleeping as well as he can, but has had ongoing sleep concerns his whole life. Allen Tapia's PHQ 9 was re-done. His score went from a 7 to an 8. He does  reports that he has seasonal mood changes an takes something for that. He is not sure if he still needs to take it. He stated that he manages his symptoms and has  good support system. He was told by staff that if he needed any mental health resources to reach out to staff and community resources could be provided.      Expected Outcomes Short: Continue to work to improve sleep, and limit stressors. Long: Continue to manage stressors of daily life through healthy means. Short: continue to attend cardiac rehab for the mental health benefits of exercise. Long: maintain good mental health routine.      Interventions Encouraged to attend Cardiac Rehabilitation for the exercise Encouraged to attend Cardiac Rehabilitation for the exercise      Continue Psychosocial Services  Follow up required by staff No Follow up required         Psychosocial Discharge (Final Psychosocial Re-Evaluation):  Psychosocial Re-Evaluation - 01/15/24 1143       Psychosocial Re-Evaluation   Current issues with Current Sleep Concerns    Comments Allen Tapia's PHQ 9 was re-done. His score went from a 7 to an 8. He does reports that he has seasonal mood changes an takes something for that. He is not sure if he still needs to take it. He stated that he manages his symptoms and has  good support system. He was told by staff that if he needed any mental health resources to reach out to staff and community resources could be provided.    Expected Outcomes Short: continue to attend cardiac rehab for the mental health benefits of exercise. Long: maintain good mental health routine.    Interventions Encouraged to attend Cardiac Rehabilitation for the exercise    Continue Psychosocial Services  No Follow up required          Vocational Rehabilitation: Provide vocational rehab assistance to qualifying candidates.   Vocational Rehab Evaluation & Intervention:  Vocational Rehab - 10/28/23 1412       Initial Vocational Rehab Evaluation &  Intervention   Assessment shows need for Vocational Rehabilitation No          Education: Education Goals: Education classes will be  provided on a variety of topics geared toward better understanding of heart health and risk factor modification. Participant will state understanding/return demonstration of topics presented as noted by education test scores.  Learning Barriers/Preferences:  Learning Barriers/Preferences - 10/28/23 1411       Learning Barriers/Preferences   Learning Barriers None    Learning Preferences Individual Instruction;Group Instruction;Verbal Instruction;Video;Written Material;Computer/Internet;Audio          General Cardiac Education Topics:  AED/CPR: - Group verbal and written instruction with the use of models to demonstrate the basic use of the AED with the basic ABC's of resuscitation.   Test and Procedures: - Group verbal and visual presentation and models provide information about basic cardiac anatomy and function. Reviews the testing methods done to diagnose heart disease and the outcomes of the test results. Describes the treatment choices: Medical Management, Angioplasty, or Coronary Bypass Surgery for treating various heart conditions including Myocardial Infarction, Angina, Valve Disease, and Cardiac Arrhythmias. Written material provided at class time. Flowsheet Row Cardiac Rehab from 01/15/2024 in [facility] Cardiac and Pulmonary Rehab  Date 11/20/23  Educator KB  Instruction Review Code 1- Verbalizes Understanding    Medication Safety: - Group verbal and visual instruction to review commonly prescribed medications for heart and lung disease. Reviews the medication, class of the drug, and side effects. Includes the steps to properly store meds and maintain the prescription regimen. Written material provided at class time.   Intimacy: - Group verbal instruction through game format to discuss how heart and lung disease can affect sexual intimacy.  Written material provided at class time.   Know Your Numbers and Heart Failure: - Group verbal and visual instruction to discuss disease risk factors for cardiac and pulmonary disease and treatment options.  Reviews associated critical values for Overweight/Obesity, Hypertension, Cholesterol, and Diabetes.  Discusses basics of heart failure: signs/symptoms and treatments.  Introduces Heart Failure Zone chart for action plan for heart failure. Written material provided at class time.   Infection Prevention: - Provides verbal and written material to individual with discussion of infection control including proper hand washing and proper equipment cleaning during exercise session. Flowsheet Row Cardiac Rehab from 01/15/2024 in [facility] Cardiac and Pulmonary Rehab  Date 10/30/23  Educator [facility]  Instruction Review Code 1- Verbalizes Understanding    Falls Prevention: - Provides verbal and written material to individual with discussion of falls prevention and safety. Flowsheet Row Cardiac Rehab from 01/15/2024 in [facility] Cardiac and Pulmonary Rehab  Date 10/30/23  Educator [facility]  Instruction Review Code 1- Verbalizes Understanding    Other: -Provides group and verbal instruction on various topics (see comments)   Knowledge Questionnaire Score:  Knowledge Questionnaire Score - 10/28/23 1432       Knowledge Questionnaire Score   Pre Score 25/26          Core Components/Risk Factors/Patient Goals at Admission:  Personal Goals and Risk Factors at Admission - 10/28/23 1410       Core Components/Risk Factors/Patient Goals on Admission    Weight Management Weight Loss   current weight 196, would like to weigh 180; Also interested in reducing dose of statin as was recently increased from 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    Expected Outcomes Short Term: Continued assessment and intervention until BP  is < 140/15mm HG in hypertensive participants. < 130/66mm HG in hypertensive participants with diabetes, heart failure or chronic kidney disease.;Long Term: Maintenance of blood pressure at goal levels.    Lipids Yes    Intervention Provide education and support for participant on nutrition & aerobic/resistive exercise along with prescribed medications to achieve LDL 70mg , HDL >40mg .    Expected Outcomes Short Term: Participant states understanding of desired cholesterol values and is compliant with medications prescribed. Participant is following exercise prescription and nutrition guidelines.;Long Term: Cholesterol controlled with medications as prescribed, with individualized exercise RX and with personalized nutrition plan. Value goals: LDL < 70mg , HDL > 40 mg.    Stress Yes    Intervention Offer individual and/or small group education and counseling on adjustment to heart disease, stress management and health-related lifestyle change. Teach and support self-help strategies.;Refer participants experiencing significant psychosocial distress to appropriate mental health specialists for further evaluation and treatment. When possible, include family members and significant others in education/counseling sessions.    Expected Outcomes Short Term: Participant demonstrates changes in health-related behavior, relaxation and other stress management skills, ability to obtain effective social support, and compliance with psychotropic medications if prescribed.;Long Term: Emotional wellbeing is indicated by absence of clinically significant psychosocial distress or social isolation.          Education:Diabetes - Individual verbal and written instruction to review signs/symptoms of diabetes, desired ranges of glucose level  fasting, after meals and with exercise. Acknowledge that pre and post exercise glucose checks will be done for 3 sessions at entry of program.   Core Components/Risk Factors/Patient Goals Review:   Goals and Risk Factor Review     Row Name 12/16/23 1115 01/15/24 1138           Core Components/Risk Factors/Patient Goals Review   Personal Goals Review Hypertension;Weight Management/Obesity Hypertension;Weight Management/Obesity      Review Allen Tapia states that he has not been taking his BP as frequently as of late. He was taking it at least once a day for the first couple of weeks after his surgery but has reduced his frequency to only when he feels off. He also states that he is trying to ultimately lose about 20lbs, but has just recently recovered from a sickness and is excited to start exercising again to work towards his weight goals. Allen Tapia was encouraged to start getting in the habit of taking his BP at home a few days a week. He is not currently doing this. He states his weight has not decreased yet.      Expected Outcomes Short: Lose 5lbs, take BP at home more frequently. Long: Lose 20lbs. Short: Lose 5lbs, state taking BP at home. Long: control cardaic risk factors.         Core Components/Risk Factors/Patient Goals at Discharge (Final Review):   Goals and Risk Factor Review - 01/15/24 1138       Core Components/Risk Factors/Patient Goals Review   Personal Goals Review Hypertension;Weight Management/Obesity    Review Allen Tapia was encouraged to start getting in the habit of taking his BP at home a few days a week. He is not currently doing this. He states his weight has not decreased yet.    Expected Outcomes Short: Lose 5lbs, state taking BP at home. Long: control cardaic risk factors.          ITP Comments:  ITP Comments     Row Name 10/28/23 1424 10/30/23 1056 11/08/23 1110 11/20/23 1045 12/18/23 1119   ITP Comments  Initial phone call completed. Diagnosis can be found in [facility]  09/16/2023. EP Orientation scheduled for Wednesday, October 30, 2023, @ 0900. Completed and gym orientation for cardiac rehab. Initial ITP created and sent for review to Dr. Oneil Pinal, Medical Director. First full day of exercise!  Patient was oriented to gym and equipment including functions, settings, policies, and procedures.  Patient's individual exercise prescription and treatment plan were reviewed.  All starting workloads were established based on the results of the 6 minute walk test done at initial orientation visit.  The plan for exercise progression was also introduced and progression will be customized based on patient's performance and goals. Virtual Visit completed. Patient informed on EP and RD appointment and 6 Minute walk test. Patient also informed of patient health questionnaires on My Chart. Patient Verbalizes understanding. For visist on 08/28/2023. New to program. 30 Day review completed. Medical Director ITP review done; changes made as directed and signed approval by Medical Director. New to program.    Row Name 01/15/24 1056           ITP Comments 30 Day review completed. Medical Director ITP review done, changes made as directed, and signed approval by Medical Director.          Comments: 30 day review

## 2024-01-15 NOTE — Progress Notes (Signed)
 Daily Session Note  Patient Details  Name: Allen Tapia MRN: [id number] Date of Birth: [birth year] Referring Provider:   Flowsheet Row Cardiac Rehab from 10/30/2023 in [facility] Cardiac and Pulmonary Rehab  Referring Provider Dr. Shelda Bruckner    Encounter Date: 01/15/2024  Check In:  Session Check In - 01/15/24 1114       Check-In   Supervising physician immediately available to respond to emergencies See telemetry face sheet for immediately available ER MD    Location [facility]-Cardiac & Pulmonary Rehab    Staff Present Devaughn Jaeger, BS, Exercise Physiologist;Kelly Dyane HECKLE, ACSM CEP, Exercise Physiologist;Emmary Culbreath RN,BSN;[employer]    Virtual Visit No    Medication changes reported     No    Fall or balance concerns reported    No    Tobacco Cessation No Change    Warm-up and Cool-down Performed on first and last piece of equipment    Resistance Training Performed Yes    VAD Patient? No    PAD/SET Patient? No      Pain Assessment   Currently in Pain? No/denies             Social History   Tobacco Use  Smoking Status Former   Current packs/day: 0.00   Average packs/day: 1 pack/day for 32.5 years (32.5 ttl pk-yrs)   Types: Cigarettes   Start date: 26   Quit date: 09/23/2006   Years since quitting: 17.3  Smokeless Tobacco Never    Goals Met:  Independence with exercise equipment Exercise tolerated well No report of concerns or symptoms today Strength training completed today  Goals Unmet:  Not Applicable  Comments: Pt able to follow exercise prescription today without complaint.  Will continue to monitor for progression.    Dr. Oneil Pinal is Medical Director for [facility] Cardiac Rehabilitation.  Dr. Fuad Aleskerov is Medical Director for [facility] Pulmonary Rehabilitation.

## 2024-01-17 ENCOUNTER — Encounter: Admitting: Emergency Medicine

## 2024-01-17 DIAGNOSIS — Z951 Presence of aortocoronary bypass graft: Secondary | ICD-10-CM | POA: Diagnosis not present

## 2024-01-17 NOTE — Progress Notes (Signed)
 Daily Session Note  Patient Details  Name: ZAYAN DELVECCHIO MRN: [id number] Date of Birth: Aug 01, 1955 Referring Provider:   Flowsheet Row Cardiac Rehab from 10/30/2023 in [facility] Cardiac and Pulmonary Rehab  Referring Provider Dr. Shelda Bruckner    Encounter Date: 01/17/2024  Check In:  Session Check In - 01/17/24 0915       Check-In   Supervising physician immediately available to respond to emergencies See telemetry face sheet for immediately available ER MD    Location [facility]-Cardiac & Pulmonary Rehab    Staff Present Devaughn Jaeger, BS, Exercise Physiologist;Latissa Frick RN,BSN;Joseph Hood RCP,RRT,BSRT;Maxon Sherwood BS, Exercise Physiologist    Virtual Visit No    Medication changes reported     No    Fall or balance concerns reported    No    Tobacco Cessation No Change    Warm-up and Cool-down Performed on first and last piece of equipment    Resistance Training Performed Yes    VAD Patient? No    PAD/SET Patient? No      Pain Assessment   Currently in Pain? No/denies             Social History   Tobacco Use  Smoking Status Former   Current packs/day: 0.00   Average packs/day: 1 pack/day for 32.5 years (32.5 ttl pk-yrs)   Types: Cigarettes   Start date: 20   Quit date: 09/23/2006   Years since quitting: 17.3  Smokeless Tobacco Never    Goals Met:  Independence with exercise equipment Exercise tolerated well No report of concerns or symptoms today Strength training completed today  Goals Unmet:  Not Applicable  Comments: .[email or web address]    Dr. Oneil Pinal is Medical Director for [facility] Cardiac Rehabilitation.  Dr. Fuad Aleskerov is Medical Director for [facility] Pulmonary Rehabilitation.

## 2024-01-18 ENCOUNTER — Encounter (HOSPITAL_BASED_OUTPATIENT_CLINIC_OR_DEPARTMENT_OTHER): Payer: Self-pay

## 2024-01-20 ENCOUNTER — Encounter

## 2024-01-20 DIAGNOSIS — Z951 Presence of aortocoronary bypass graft: Secondary | ICD-10-CM | POA: Diagnosis not present

## 2024-01-20 MED ORDER — APIXABAN 5 MG PO TABS: 5.0000 mg | ORAL_TABLET | Freq: Two times a day (BID) | ORAL | 1 refills | Status: DC

## 2024-01-20 NOTE — Progress Notes (Signed)
 Daily Session Note  Patient Details  Name: Allen Tapia MRN: [id number] Date of Birth: Sep 06, 1955 Referring Provider:   Flowsheet Row Cardiac Rehab from 10/30/2023 in [facility] Cardiac and Pulmonary Rehab  Referring Provider Dr. Shelda Bruckner    Encounter Date: 01/20/2024  Check In:  Session Check In - 01/20/24 1108       Check-In   Supervising physician immediately available to respond to emergencies See telemetry face sheet for immediately available ER MD    Location [facility]-Cardiac & Pulmonary Rehab    Staff Present Burnard Davenport RN,BSN,MPA;Joseph Rolinda RCP,RRT,BSRT;Laura Cates RN,BSN;Raniya Golembeski Dyane BS, ACSM CEP, Exercise Physiologist    Virtual Visit No    Medication changes reported     Yes    Comments no longer taking amioderone    Fall or balance concerns reported    No    Tobacco Cessation No Change    Warm-up and Cool-down Performed on first and last piece of equipment    Resistance Training Performed Yes    VAD Patient? No    PAD/SET Patient? No      Pain Assessment   Currently in Pain? No/denies             Social History   Tobacco Use  Smoking Status Former   Current packs/day: 0.00   Average packs/day: 1 pack/day for 32.5 years (32.5 ttl pk-yrs)   Types: Cigarettes   Start date: 15   Quit date: 09/23/2006   Years since quitting: 17.3  Smokeless Tobacco Never    Goals Met:  Independence with exercise equipment Exercise tolerated well No report of concerns or symptoms today Strength training completed today  Goals Unmet:  Not Applicable  Comments: Pt able to follow exercise prescription today without complaint.  Will continue to monitor for progression.    Dr. Oneil Pinal is Medical Director for [facility] Cardiac Rehabilitation.  Dr. Fuad Aleskerov is Medical Director for [facility] Pulmonary Rehabilitation.

## 2024-01-22 ENCOUNTER — Encounter: Admitting: Emergency Medicine

## 2024-01-22 DIAGNOSIS — Z951 Presence of aortocoronary bypass graft: Secondary | ICD-10-CM

## 2024-01-22 NOTE — Progress Notes (Signed)
 Daily Session Note  Patient Details  Name: Allen Tapia MRN: [id number] Date of Birth: Sep 03, 1955 Referring Provider:   Flowsheet Row Cardiac Rehab from 10/30/2023 in [facility] Cardiac and Pulmonary Rehab  Referring Provider Dr. Shelda Bruckner    Encounter Date: 01/22/2024  Check In:  Session Check In - 01/22/24 1121       Check-In   Supervising physician immediately available to respond to emergencies See telemetry face sheet for immediately available ER MD    Location [facility]-Cardiac & Pulmonary Rehab    Staff Present Devaughn Jaeger, BS, Exercise Physiologist;Margaret Best, MS, Exercise Physiologist;Lynzi Meulemans RN,BSN;Joseph Rolinda RCP,RRT,BSRT    Virtual Visit No    Medication changes reported     Yes    Comments double buproprion, cut metoprolol  dose in half    Fall or balance concerns reported    No    Tobacco Cessation No Change    Warm-up and Cool-down Performed on first and last piece of equipment    Resistance Training Performed Yes    VAD Patient? No    PAD/SET Patient? No      Pain Assessment   Currently in Pain? No/denies             Social History   Tobacco Use  Smoking Status Former   Current packs/day: 0.00   Average packs/day: 1 pack/day for 32.5 years (32.5 ttl pk-yrs)   Types: Cigarettes   Start date: 16   Quit date: 09/23/2006   Years since quitting: 17.3  Smokeless Tobacco Never    Goals Met:  Independence with exercise equipment Exercise tolerated well No report of concerns or symptoms today Strength training completed today  Goals Unmet:  Not Applicable  Comments: Pt able to follow exercise prescription today without complaint.  Will continue to monitor for progression.    Dr. Oneil Pinal is Medical Director for [facility] Cardiac Rehabilitation.  Dr. Fuad Aleskerov is Medical Director for [facility] Pulmonary Rehabilitation.

## 2024-01-24 ENCOUNTER — Encounter: Admitting: Emergency Medicine

## 2024-01-24 DIAGNOSIS — Z951 Presence of aortocoronary bypass graft: Secondary | ICD-10-CM | POA: Diagnosis not present

## 2024-01-24 NOTE — Progress Notes (Signed)
 Daily Session Note  Patient Details  Name: Allen Tapia MRN: [id number] Date of Birth: November 09, 1955 Referring Provider:   Flowsheet Row Cardiac Rehab from 10/30/2023 in [facility] Cardiac and Pulmonary Rehab  Referring Provider Dr. Shelda Bruckner    Encounter Date: 01/24/2024  Check In:  Session Check In - 01/24/24 1058       Check-In   Supervising physician immediately available to respond to emergencies See telemetry face sheet for immediately available ER MD    Location [facility]-Cardiac & Pulmonary Rehab    Staff Present Devaughn Jaeger, BS, Exercise Physiologist;Caydon Feasel RN,BSN;Joseph Hood RCP,RRT,BSRT;Maxon [place] BS, Exercise Physiologist    Virtual Visit No    Medication changes reported     No    Fall or balance concerns reported    No    Tobacco Cessation No Change    Warm-up and Cool-down Performed on first and last piece of equipment    Resistance Training Performed Yes    VAD Patient? No    PAD/SET Patient? No      Pain Assessment   Currently in Pain? No/denies             Social History   Tobacco Use  Smoking Status Former   Current packs/day: 0.00   Average packs/day: 1 pack/day for 32.5 years (32.5 ttl pk-yrs)   Types: Cigarettes   Start date: 61   Quit date: 09/23/2006   Years since quitting: 17.3  Smokeless Tobacco Never    Goals Met:  Independence with exercise equipment Exercise tolerated well No report of concerns or symptoms today Strength training completed today  Goals Unmet:  Not Applicable  Comments: Pt able to follow exercise prescription today without complaint.  Will continue to monitor for progression.    Dr. Oneil Pinal is Medical Director for [facility] Cardiac Rehabilitation.  Dr. Fuad Aleskerov is Medical Director for [facility] Pulmonary Rehabilitation.

## 2024-01-27 ENCOUNTER — Other Ambulatory Visit: Payer: Self-pay | Admitting: Pulmonary Disease

## 2024-01-27 ENCOUNTER — Encounter: Attending: Cardiology | Admitting: *Deleted

## 2024-01-27 VITALS — Wt 211.3 lb

## 2024-01-27 DIAGNOSIS — Z48812 Encounter for surgical aftercare following surgery on the circulatory system: Secondary | ICD-10-CM | POA: Insufficient documentation

## 2024-01-27 DIAGNOSIS — Z951 Presence of aortocoronary bypass graft: Secondary | ICD-10-CM | POA: Insufficient documentation

## 2024-01-27 NOTE — Patient Instructions (Signed)
 Discharge Patient Instructions  Patient Details  Name: Allen Tapia MRN: [id number] Date of Birth: 05-09-1955 Referring Provider:  Diedra Lame, MD   Number of Visits: 49  Reason for Discharge:  Patient reached a stable level of exercise. Patient independent in their exercise. Patient has met program and personal goals.  Smoking History:  Social History   Tobacco Use  Smoking Status Former   Current packs/day: 0.00   Average packs/day: 1 pack/day for 32.5 years (32.5 ttl pk-yrs)   Types: Cigarettes   Start date: 79   Quit date: 09/23/2006   Years since quitting: 17.3  Smokeless Tobacco Never    Diagnosis:  S/P CABG x 2  Initial Exercise Prescription:  Initial Exercise Prescription - 10/30/23 1000       Date of Initial Exercise RX and Referring Provider   Date 10/30/23    Referring Provider Dr. Shelda Bruckner      Oxygen   Maintain Oxygen Saturation 88% or higher      Treadmill   MPH 2.5    Grade 0    Minutes 15    METs 2.91      Recumbant Bike   Level 2    RPM 50    Watts 25    Minutes 15    METs 3.5      NuStep   Level 2    SPM 80    Minutes 15    METs 3.5      T5 Nustep   Level 2    SPM 80    Minutes 15    METs 3.5      Biostep-RELP   Level 2    SPM 50    Minutes 15    METs 3.5      Track   Laps 35    Minutes 15    METs 2.9      Prescription Details   Duration Progress to 30 minutes of continuous aerobic without signs/symptoms of physical distress      Intensity   THRR 40-80% of Max Heartrate 96-133    Ratings of Perceived Exertion 11-13    Perceived Dyspnea 0-4      Progression   Progression Continue to progress workloads to maintain intensity without signs/symptoms of physical distress.      Resistance Training   Training Prescription Yes    Weight 5lb    Reps 10-15          Discharge Exercise Prescription (Final Exercise Prescription Changes):  Exercise Prescription Changes - 01/27/24 1000        Response to Exercise   Blood Pressure (Admit) 100/58    Blood Pressure (Exit) 100/52    Heart Rate (Admit) 57 bpm    Heart Rate (Exercise) 102 bpm    Heart Rate (Exit) 78 bpm    Rating of Perceived Exertion (Exercise) 13    Symptoms none    Duration Continue with 30 min of aerobic exercise without signs/symptoms of physical distress.    Intensity THRR unchanged      Progression   Progression Continue to progress workloads to maintain intensity without signs/symptoms of physical distress.    Average METs 3.66      Resistance Training   Training Prescription Yes    Weight 5 lb    Reps 10-15      Interval Training   Interval Training No      Treadmill   MPH 2.8    Grade 0    Minutes  15    METs 3.14      Recumbant Bike   Level 4    Watts 33    Minutes 15    METs 3.1      NuStep   Level 5    Minutes 15    METs 4.7      REL-XR   Level 1    Minutes 15    METs 6.9      T5 Nustep   Level 3    Minutes 15    METs 2.2      Biostep-RELP   Level 7    Minutes 15    METs 4      Oxygen   Maintain Oxygen Saturation 88% or higher          Functional Capacity:  6 Minute Walk     Row Name 10/30/23 1057 01/27/24 1121       6 Minute Walk   Phase Initial Discharge    Distance 1395 feet 1505 feet    Distance % Change -- 8 %    Distance Feet Change -- 110 ft    Walk Time 6 minutes 6 minutes    # of Rest Breaks 0 0    MPH 2.64 2.85    METS 3.48 3.4    RPE 11 13    Perceived Dyspnea  1 1    VO2 Peak 12.2 11.9    Symptoms No No    Resting HR 59 bpm 65 bpm    Resting BP 124/62 122/82    Resting Oxygen Saturation  93 % 93 %    Exercise Oxygen Saturation  during 6 min walk 97 % 91 %    Max Ex. HR 115 bpm 105 bpm    Max Ex. BP 136/62 124/72    2 Minute Post BP 128/64 100/62       Nutrition & Weight - Outcomes:  Pre Biometrics - 10/30/23 1104       Pre Biometrics   Height 6' 0.6 (1.844 m)    Weight 202 lb 8 oz (91.9 kg)    Waist Circumference 41 inches     Hip Circumference 41 inches    Waist to Hip Ratio 1 %    BMI (Calculated) 27.01    Single Leg Stand 14.9 seconds          Post Biometrics - 01/27/24 1124        Post  Biometrics   Weight 211 lb 4.8 oz (95.8 kg)    Waist Circumference 43 inches    Hip Circumference 43 inches    Waist to Hip Ratio 1 %    BMI (Calculated) 28.65    Single Leg Stand 2.06 seconds           Goals reviewed with patient; copy given to patient.

## 2024-01-27 NOTE — Progress Notes (Signed)
 Daily Session Note  Patient Details  Name: Allen Tapia MRN: [id number] Date of Birth: [birth year] Referring Provider:   Flowsheet Row Cardiac Rehab from 10/30/2023 in [facility] Cardiac and Pulmonary Rehab  Referring Provider Dr. Shelda Bruckner    Encounter Date: 01/27/2024  Check In:  Session Check In - 01/27/24 1110       Check-In   Supervising physician immediately available to respond to emergencies See telemetry face sheet for immediately available ER MD    Location [facility]-Cardiac & Pulmonary Rehab    Staff Present Othel Durand, RN, BSN, CCRP;Laura Cates RN,BSN;Joseph [employer] BS, ACSM CEP, Exercise Physiologist    Virtual Visit No    Medication changes reported     No    Fall or balance concerns reported    No    Warm-up and Cool-down Performed on first and last piece of equipment    Resistance Training Performed Yes    VAD Patient? No    PAD/SET Patient? No      Pain Assessment   Currently in Pain? No/denies             Social History   Tobacco Use  Smoking Status Former   Current packs/day: 0.00   Average packs/day: 1 pack/day for 32.5 years (32.5 ttl pk-yrs)   Types: Cigarettes   Start date: 53   Quit date: 09/23/2006   Years since quitting: 17.3  Smokeless Tobacco Never    Goals Met:  Independence with exercise equipment Exercise tolerated well No report of concerns or symptoms today  Goals Unmet:  Not Applicable  Comments: Pt able to follow exercise prescription today without complaint.  Will continue to monitor for progression.   6 Minute Walk     Row Name 10/30/23 1057 01/27/24 1121       6 Minute Walk   Phase Initial Discharge    Distance 1395 feet 1505 feet    Distance % Change -- 8 %    Distance Feet Change -- 110 ft    Walk Time 6 minutes 6 minutes    # of Rest Breaks 0 0    MPH 2.64 2.85    METS 3.48 3.4    RPE 11 13    Perceived Dyspnea  1 1    VO2 Peak 12.2 11.9    Symptoms No No    Resting HR 59  bpm 65 bpm    Resting BP 124/62 122/82    Resting Oxygen Saturation  93 % 93 %    Exercise Oxygen Saturation  during 6 min walk 97 % 91 %    Max Ex. HR 115 bpm 105 bpm    Max Ex. BP 136/62 124/72    2 Minute Post BP 128/64 100/62        Dr. Oneil Pinal is Medical Director for [facility] Cardiac Rehabilitation.  Dr. Fuad Aleskerov is Medical Director for [facility] Pulmonary Rehabilitation.

## 2024-01-29 ENCOUNTER — Encounter

## 2024-01-31 ENCOUNTER — Encounter: Admitting: Emergency Medicine

## 2024-01-31 DIAGNOSIS — Z951 Presence of aortocoronary bypass graft: Secondary | ICD-10-CM

## 2024-01-31 DIAGNOSIS — Z48812 Encounter for surgical aftercare following surgery on the circulatory system: Secondary | ICD-10-CM | POA: Diagnosis not present

## 2024-01-31 NOTE — Progress Notes (Signed)
 Daily Session Note  Patient Details  Name: Allen Tapia MRN: [id number] Date of Birth: 08-30-1955 Referring Provider:   Flowsheet Row Cardiac Rehab from 10/30/2023 in [facility] Cardiac and Pulmonary Rehab  Referring Provider Dr. Shelda Bruckner    Encounter Date: 01/31/2024  Check In:  Session Check In - 01/31/24 0919       Check-In   Supervising physician immediately available to respond to emergencies See telemetry face sheet for immediately available ER MD    Location [facility]-Cardiac & Pulmonary Rehab    Staff Present Leita Franks RN,BSN;Joseph [facility] BS, Exercise Physiologist;Noah Tickle, BS, Exercise Physiologist    Virtual Visit No    Medication changes reported     No    Fall or balance concerns reported    No    Tobacco Cessation No Change    Warm-up and Cool-down Performed on first and last piece of equipment    Resistance Training Performed Yes    VAD Patient? No    PAD/SET Patient? No      Pain Assessment   Currently in Pain? No/denies             Social History   Tobacco Use  Smoking Status Former   Current packs/day: 0.00   Average packs/day: 1 pack/day for 32.5 years (32.5 ttl pk-yrs)   Types: Cigarettes   Start date: 53   Quit date: 09/23/2006   Years since quitting: 17.3  Smokeless Tobacco Never    Goals Met:  Independence with exercise equipment Exercise tolerated well No report of concerns or symptoms today Strength training completed today  Goals Unmet:  Not Applicable  Comments: Pt able to follow exercise prescription today without complaint.  Will continue to monitor for progression.    Dr. Oneil Pinal is Medical Director for [facility] Cardiac Rehabilitation.  Dr. Fuad Aleskerov is Medical Director for [facility] Pulmonary Rehabilitation.

## 2024-02-03 ENCOUNTER — Encounter

## 2024-02-03 DIAGNOSIS — Z48812 Encounter for surgical aftercare following surgery on the circulatory system: Secondary | ICD-10-CM | POA: Diagnosis not present

## 2024-02-03 DIAGNOSIS — Z951 Presence of aortocoronary bypass graft: Secondary | ICD-10-CM

## 2024-02-03 NOTE — Progress Notes (Signed)
 Daily Session Note  Patient Details  Name: Allen Tapia MRN: [id number] Date of Birth: [birth year] Referring Provider:   Flowsheet Row Cardiac Rehab from 10/30/2023 in [facility] Cardiac and Pulmonary Rehab  Referring Provider Dr. Shelda Bruckner    Encounter Date: 02/03/2024  Check In:  Session Check In - 02/03/24 1105       Check-In   Supervising physician immediately available to respond to emergencies See telemetry face sheet for immediately available ER MD    Location [facility]-Cardiac & Pulmonary Rehab    Staff Present Burnard Hint BS, ACSM CEP, Exercise Physiologist;Joseph Rolinda RCP,RRT,BSRT;Laura Cates RN,BSN;Anamarie Hunn RN,BSN,MPA;Mary Godley, RN, DNP, NE-BC    Virtual Visit No    Medication changes reported     No    Fall or balance concerns reported    No    Tobacco Cessation No Change    Warm-up and Cool-down Performed on first and last piece of equipment    Resistance Training Performed Yes    VAD Patient? No    PAD/SET Patient? No      Pain Assessment   Currently in Pain? No/denies             Social History   Tobacco Use  Smoking Status Former   Current packs/day: 0.00   Average packs/day: 1 pack/day for 32.5 years (32.5 ttl pk-yrs)   Types: Cigarettes   Start date: 49   Quit date: 09/23/2006   Years since quitting: 17.3  Smokeless Tobacco Never    Goals Met:  Independence with exercise equipment Exercise tolerated well No report of concerns or symptoms today Strength training completed today  Goals Unmet:  Not Applicable  Comments: Pt able to follow exercise prescription today without complaint.  Will continue to monitor for progression.    Dr. Oneil Pinal is Medical Director for [facility] Cardiac Rehabilitation.  Dr. Fuad Aleskerov is Medical Director for [facility] Pulmonary Rehabilitation.

## 2024-02-05 ENCOUNTER — Encounter: Admitting: Emergency Medicine

## 2024-02-05 DIAGNOSIS — Z951 Presence of aortocoronary bypass graft: Secondary | ICD-10-CM

## 2024-02-05 DIAGNOSIS — Z48812 Encounter for surgical aftercare following surgery on the circulatory system: Secondary | ICD-10-CM | POA: Diagnosis not present

## 2024-02-05 NOTE — Progress Notes (Signed)
 Daily Session Note  Patient Details  Name: Allen Tapia MRN: [id number] Date of Birth: [birth year] Referring Provider:   Flowsheet Row Cardiac Rehab from 10/30/2023 in [facility] Cardiac and Pulmonary Rehab  Referring Provider Dr. Shelda Bruckner    Encounter Date: 02/05/2024  Check In:  Session Check In - 02/05/24 1135       Check-In   Supervising physician immediately available to respond to emergencies See telemetry face sheet for immediately available ER MD    Location [facility]-Cardiac & Pulmonary Rehab    Staff Present Leita Franks RN,BSN;Joseph [facility] BS, Exercise Physiologist;Margaret Best, MS, Exercise Physiologist    Virtual Visit No    Medication changes reported     No    Fall or balance concerns reported    No    Tobacco Cessation No Change    Warm-up and Cool-down Performed on first and last piece of equipment    Resistance Training Performed Yes    VAD Patient? No    PAD/SET Patient? No      Pain Assessment   Currently in Pain? No/denies             Social History   Tobacco Use  Smoking Status Former   Current packs/day: 0.00   Average packs/day: 1 pack/day for 32.5 years (32.5 ttl pk-yrs)   Types: Cigarettes   Start date: 50   Quit date: 09/23/2006   Years since quitting: 17.3  Smokeless Tobacco Never    Goals Met:  Independence with exercise equipment Exercise tolerated well No report of concerns or symptoms today Strength training completed today  Goals Unmet:  Not Applicable  Comments: Pt able to follow exercise prescription today without complaint.  Will continue to monitor for progression.    Dr. Oneil Pinal is Medical Director for [facility] Cardiac Rehabilitation.  Dr. Fuad Aleskerov is Medical Director for [facility] Pulmonary Rehabilitation.

## 2024-02-07 ENCOUNTER — Encounter

## 2024-02-07 DIAGNOSIS — Z951 Presence of aortocoronary bypass graft: Secondary | ICD-10-CM

## 2024-02-07 DIAGNOSIS — Z48812 Encounter for surgical aftercare following surgery on the circulatory system: Secondary | ICD-10-CM | POA: Diagnosis not present

## 2024-02-07 NOTE — Progress Notes (Signed)
 Daily Session Note  Patient Details  Name: Allen Tapia MRN: [id number] Date of Birth: December 17, 1955 Referring Provider:   Flowsheet Row Cardiac Rehab from 10/30/2023 in [facility] Cardiac and Pulmonary Rehab  Referring Provider Dr. Shelda Bruckner    Encounter Date: 02/07/2024  Check In:  Session Check In - 02/07/24 0917       Check-In   Supervising physician immediately available to respond to emergencies See telemetry face sheet for immediately available ER MD    Location [facility]-Cardiac & Pulmonary Rehab    Staff Present Burnard Davenport RN,BSN,MPA;Joseph Rolinda RCP,RRT,BSRT;Meredith Tressa RN,BSN;Noah Tickle, BS, Exercise Physiologist    Virtual Visit No    Medication changes reported     No    Fall or balance concerns reported    No    Tobacco Cessation No Change    Warm-up and Cool-down Performed on first and last piece of equipment    Resistance Training Performed Yes    VAD Patient? No    PAD/SET Patient? No      Pain Assessment   Currently in Pain? No/denies             Social History   Tobacco Use  Smoking Status Former   Current packs/day: 0.00   Average packs/day: 1 pack/day for 32.5 years (32.5 ttl pk-yrs)   Types: Cigarettes   Start date: 78   Quit date: 09/23/2006   Years since quitting: 17.3  Smokeless Tobacco Never    Goals Met:  Proper associated with RPD/PD & O2 Sat Independence with exercise equipment Exercise tolerated well No report of concerns or symptoms today Strength training completed today  Goals Unmet:  Not Applicable  Comments: Pt able to follow exercise prescription today without complaint.  Will continue to monitor for progression.    Dr. Oneil Pinal is Medical Director for [facility] Cardiac Rehabilitation.  Dr. Fuad Aleskerov is Medical Director for [facility] Pulmonary Rehabilitation.

## 2024-02-10 ENCOUNTER — Encounter

## 2024-02-12 ENCOUNTER — Encounter: Admitting: *Deleted

## 2024-02-12 DIAGNOSIS — Z48812 Encounter for surgical aftercare following surgery on the circulatory system: Secondary | ICD-10-CM | POA: Diagnosis not present

## 2024-02-12 DIAGNOSIS — Z951 Presence of aortocoronary bypass graft: Secondary | ICD-10-CM

## 2024-02-12 NOTE — Progress Notes (Signed)
 Cardiac Individual Treatment Plan  Patient Details  Name: PAVAN BRING MRN: [id number] Date of Birth: 1956-01-28 Referring Provider:   Flowsheet Row Cardiac Rehab from 10/30/2023 in [facility] Cardiac and Pulmonary Rehab  Referring Provider Dr. Shelda Bruckner    Initial Encounter Date:  Flowsheet Row Cardiac Rehab from 10/30/2023 in [facility] Cardiac and Pulmonary Rehab  Date 10/30/23    Visit Diagnosis: S/P CABG x 2  Patient's Home Medications on Admission:  Current Outpatient Medications:    acetaminophen  (TYLENOL ) 500 MG tablet, Take 2 tablets (1,000 mg total) by mouth every 6 (six) hours as needed for mild pain., Disp: 60 tablet, Rfl: 0   amphetamine -dextroamphetamine (ADDERALL XR) 25 MG 24 hr capsule, Take 25 mg by mouth daily as needed (focus)., Disp: , Rfl:    apixaban  (ELIQUIS ) 5 MG TABS tablet, Take 1 tablet (5 mg total) by mouth 2 (two) times daily., Disp: 180 tablet, Rfl: 1   aspirin  EC 81 MG tablet, Take 81 mg by mouth daily. Swallow whole., Disp: , Rfl:    baclofen  (LIORESAL ) 20 MG tablet, Take 30 mg by mouth 3 (three) times daily., Disp: , Rfl:    BREZTRI  AEROSPHERE 160-9-4.8 MCG/ACT AERO, USE 2 INHALATIONS IN THE MORNING AND AT BEDTIME, Disp: 32.1 g, Rfl: 3   buPROPion  (WELLBUTRIN  XL) 150 MG 24 hr tablet, Take 150 mg by mouth daily., Disp: , Rfl:    gabapentin  (NEURONTIN ) 300 MG capsule, Take 1-2 capsules (300-600 mg total) by mouth 2 (two) times daily. 600 mg in am and 300 mg in afternoon, Disp: 60 capsule, Rfl: 0   metoprolol  tartrate (LOPRESSOR ) 25 MG tablet, Take 0.5 tablets (12.5 mg total) by mouth 2 (two) times daily., Disp: 90 tablet, Rfl: 0   montelukast  (SINGULAIR ) 10 MG tablet, TAKE 1 TABLET AT BEDTIME, Disp: 90 tablet, Rfl: 0   Multiple Vitamins-Minerals (MULTIVITAMIN WITH MINERALS) tablet, Take 1 tablet by mouth daily., Disp: , Rfl:    Ofatumumab (KESIMPTA) 20 MG/0.4ML SOAJ, Inject 20 mg into the skin every 30 (thirty) days. 1st day of every month, Disp: , Rfl:     pantoprazole  (PROTONIX ) 20 MG tablet, Take 20 mg by mouth at bedtime., Disp: , Rfl:    Polyethyl Glycol-Propyl Glycol (SYSTANE) 0.4-0.3 % SOLN, Place 1 drop into both eyes 4 (four) times daily as needed (dry eyes). (Patient not taking: Reported on 01/13/2024), Disp: , Rfl:    rosuvastatin  (CRESTOR ) 40 MG tablet, Take 40 mg by mouth at bedtime., Disp: , Rfl:    Spacer/Aero-Holding Chambers (AEROCHAMBER MV) inhaler, Use as instructed, Disp: 1 each, Rfl: 0   tamsulosin  (FLOMAX ) 0.4 MG CAPS capsule, TAKE 1 CAPSULE DAILY, Disp: 90 capsule, Rfl: 3   traZODone  (DESYREL ) 100 MG tablet, Take 100 mg by mouth at bedtime., Disp: , Rfl:    VENTOLIN  HFA 108 (90 Base) MCG/ACT inhaler, Inhale 2 puffs into the lungs every 4 (four) hours as needed for wheezing or shortness of breath., Disp: 3 each, Rfl: 4   Vibegron  (GEMTESA ) 75 MG TABS, Take 1 tablet (75 mg total) by mouth daily., Disp: 90 tablet, Rfl: 3   VITAMIN D  PO, Take 1 capsule by mouth daily., Disp: , Rfl:   Past Medical History: Past Medical History:  Diagnosis Date   Anginal pain    atypical chest pain cardiac cath performed at that time   Arthritis    Asthma    Atypical chest pain    Barrett's esophagus    Bilateral cataracts    BPH (benign  prostatic hyperplasia)    Cancer (HCC)    skin ca in face and hand   Chicken pox    Chronic constipation    no current problem per pt 09/12/23   COPD (chronic obstructive pulmonary disease) (HCC)    Coronary artery disease 07/14/2007   a.) LHC 07/14/2007: 40% pRCA-1, 40% pRCA-2, 40% dRCA, 20% RPL1, 30% LM, 20% mLCx-1, 40% mLCx-2, 20% OM3-1, 20% OM3-2, 20% pLAD, 40% mLAD-1, 70% mLAD-2, 40% mLAD-3, 30% dLAD, 40% D1, 40% D2 --> med mgmt.   Depression    Foreign body (FB) in soft tissue    Former tobacco use    GERD (gastroesophageal reflux disease)    History of 2019 novel coronavirus disease (COVID-19) 04/07/2019   a.) tested (+) at [facility] 04/16/2019, 05/30/2021; b.) 2 other (+) tests at home  (dates unknown)   History of kidney stones    Hyperlipidemia    Hyperplastic colon polyp    Hypertension    Infrarenal abdominal aortic aneurysm (AAA) without rupture    a.) MR lumbar spine 12/30/2020: infrarenal saccular AAA measuring 2.2 cm; b.) CTA A/P 01/05/2021: measured 2.6 cm; c.) s/p EVAR 03/03/2021   Ischemic heart disease 12/20/2010   Long term current use of immunosuppressive drug    a.) on ofatumumab for MS   MS (multiple sclerosis)    a.) on ofatumumab   Neuromuscular disorder (HCC)    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 - takes gabapentin    Pneumonia    x several   Pre-diabetes    Shingles    Tubular adenoma of colon 08/23/2016    Tobacco Use: Social History   Tobacco Use  Smoking Status Former   Current packs/day: 0.00   Average packs/day: 1 pack/day for 32.5 years (32.5 ttl pk-yrs)   Types: Cigarettes   Start date: 64   Quit date: 09/23/2006   Years since quitting: 17.4  Smokeless Tobacco Never    Labs: Review Flowsheet  More data may exist      Latest Ref Rng & Units 02/28/2021 06/06/2022 09/12/2023 09/16/2023 09/17/2023  Labs for ITP Cardiac and Pulmonary Rehab  Cholestrol 0 - 200 mg/dL - - - - 56   LDL (calc) 0 - 99 mg/dL - - - - 19   HDL-C >59 mg/dL - - - - 29   Trlycerides <150 mg/dL - - - - 40   Hemoglobin A1c 4.8 - 5.6 % - 6.1  5.8  - 5.9   PH, Arterial 7.35 - 7.45 7.383  - - 7.309  7.284  7.358  7.345  7.406  7.394  7.335  -  PCO2 arterial 32 - 48 mmHg 44.2  - - 40.9  48.1  41.1  42.9  39.4  39.5  42.9  -  Bicarbonate 20.0 - 28.0 mmol/L 25.7  - - 20.6  23.0  23.1  23.4  24.7  24.4  24.2  22.9  -  TCO2 22 - 32 mmol/L - - - 22  24  24  21  25  26  24  26  25  24  22  24  26   -  Acid-base deficit 0.0 - 2.0 mmol/L - - - 5.0  4.0  2.0  2.0  1.0  1.0  3.0  -  O2 Saturation % 97.1  - - 96  100  99  95  100  76  100  100  -    Details       Multiple values from one day  are sorted in reverse-chronological order          Exercise Target Goals: Exercise Program  Goal: Individual exercise prescription set using results from initial 6 min walk test and THRR while considering  patient's activity barriers and safety.   Exercise Prescription Goal: Initial exercise prescription builds to 30-45 minutes a day of aerobic activity, 2-3 days per week.  Home exercise guidelines will be given to patient during program as part of exercise prescription that the participant will acknowledge.   Education: Aerobic Exercise: - Group verbal and visual presentation on the components of exercise prescription. Introduces F.I.T.T principle from ACSM for exercise prescriptions.  Reviews F.I.T.T. principles of aerobic exercise including progression. Written material provided at class time.   Education: Resistance Exercise: - Group verbal and visual presentation on the components of exercise prescription. Introduces F.I.T.T principle from ACSM for exercise prescriptions  Reviews F.I.T.T. principles of resistance exercise including progression. Written material provided at class time. Flowsheet Row Cardiac Rehab from 02/05/2024 in [facility] Cardiac and Pulmonary Rehab  Date 01/01/24  Educator nt  Instruction Review Code 1- Tefl Teacher Understanding     Education: Exercise & Equipment Safety: - Individual verbal instruction and demonstration of equipment use and safety with use of the equipment. Flowsheet Row Cardiac Rehab from 02/05/2024 in [facility] Cardiac and Pulmonary Rehab  Date 10/30/23  Educator [facility]  Instruction Review Code 1- Verbalizes Understanding    Education: Exercise Physiology & General Exercise Guidelines: - Group verbal and written instruction with models to review the exercise physiology of the cardiovascular system and associated critical values. Provides general exercise guidelines with specific guidelines to those with heart or lung disease. Written material provided at class time. Flowsheet Row Cardiac Rehab from 02/05/2024 in [facility] Cardiac and Pulmonary Rehab   Date 12/25/23  Educator nt  Instruction Review Code 1- Tefl Teacher Understanding    Education: Flexibility, Balance, Mind/Body Relaxation: - Group verbal and visual presentation with interactive activity on the components of exercise prescription. Introduces F.I.T.T principle from ACSM for exercise prescriptions. Reviews F.I.T.T. principles of flexibility and balance exercise training including progression. Also discusses the mind body connection.  Reviews various relaxation techniques to help reduce and manage stress (i.e. Deep breathing, progressive muscle relaxation, and visualization). Balance handout provided to take home. Written material provided at class time. Flowsheet Row Cardiac Rehab from 02/05/2024 in [facility] Cardiac and Pulmonary Rehab  Date 01/01/24  Educator nt  Instruction Review Code 1- Verbalizes Understanding    Activity Barriers & Risk Stratification:  Activity Barriers & Cardiac Risk Stratification - 10/30/23 1059       Activity Barriers & Cardiac Risk Stratification   Activity Barriers Arthritis;Right Knee Replacement;Left Knee Replacement;Joint Problems;Other (comment)    Comments hx of arthritis in R hand, elbow; also has MS but generally has no pain    Cardiac Risk Stratification High          6 Minute Walk:  6 Minute Walk     Row Name 10/30/23 1057 01/27/24 1121       6 Minute Walk   Phase Initial Discharge    Distance 1395 feet 1505 feet    Distance % Change -- 8 %    Distance Feet Change -- 110 ft    Walk Time 6 minutes 6 minutes    # of Rest Breaks 0 0    MPH 2.64 2.85    METS 3.48 3.4    RPE 11 13    Perceived Dyspnea  1 1  VO2 Peak 12.2 11.9    Symptoms No No    Resting HR 59 bpm 65 bpm    Resting BP 124/62 122/82    Resting Oxygen Saturation  93 % 93 %    Exercise Oxygen Saturation  during 6 min walk 97 % 91 %    Max Ex. HR 115 bpm 105 bpm    Max Ex. BP 136/62 124/72    2 Minute Post BP 128/64 100/62       Oxygen Initial  Assessment:   Oxygen Re-Evaluation:   Oxygen Discharge (Final Oxygen Re-Evaluation):   Initial Exercise Prescription:  Initial Exercise Prescription - 10/30/23 1000       Date of Initial Exercise RX and Referring Provider   Date 10/30/23    Referring Provider Dr. Shelda Bruckner      Oxygen   Maintain Oxygen Saturation 88% or higher      Treadmill   MPH 2.5    Grade 0    Minutes 15    METs 2.91      Recumbant Bike   Level 2    RPM 50    Watts 25    Minutes 15    METs 3.5      NuStep   Level 2    SPM 80    Minutes 15    METs 3.5      T5 Nustep   Level 2    SPM 80    Minutes 15    METs 3.5      Biostep-RELP   Level 2    SPM 50    Minutes 15    METs 3.5      Track   Laps 35    Minutes 15    METs 2.9      Prescription Details   Duration Progress to 30 minutes of continuous aerobic without signs/symptoms of physical distress      Intensity   THRR 40-80% of Max Heartrate 96-133    Ratings of Perceived Exertion 11-13    Perceived Dyspnea 0-4      Progression   Progression Continue to progress workloads to maintain intensity without signs/symptoms of physical distress.      Resistance Training   Training Prescription Yes    Weight 5lb    Reps 10-15          Perform Capillary Blood Glucose checks as needed.  Exercise Prescription Changes:   Exercise Prescription Changes     Row Name 10/30/23 1100 11/21/23 1600 12/03/23 1300 01/02/24 1600 01/16/24 1500     Response to Exercise   Blood Pressure (Admit) 124/62 102/54 112/62 104/66 112/68   Blood Pressure (Exercise) 136/62 126/52 128/70 112/60 --   Blood Pressure (Exit) 128/64 102/54 102/58 106/66 104/56   Heart Rate (Admit) 59 bpm 58 bpm 53 bpm 58 bpm 56 bpm   Heart Rate (Exercise) 115 bpm 97 bpm 99 bpm 99 bpm 106 bpm   Heart Rate (Exit) 60 bpm 68 bpm 57 bpm 59 bpm 71 bpm   Oxygen Saturation (Admit) 93 % -- -- -- --   Oxygen Saturation (Exercise) 97 % -- -- -- --   Oxygen  Saturation (Exit) 97 % -- -- -- --   Rating of Perceived Exertion (Exercise) 11 13 13 14 13    Perceived Dyspnea (Exercise) 1 -- -- -- --   Symptoms none none none none none   Comments results First three days of exercise -- -- --   Duration --  Continue with 30 min of aerobic exercise without signs/symptoms of physical distress. Continue with 30 min of aerobic exercise without signs/symptoms of physical distress. Continue with 30 min of aerobic exercise without signs/symptoms of physical distress. Continue with 30 min of aerobic exercise without signs/symptoms of physical distress.   Intensity -- THRR unchanged THRR unchanged THRR unchanged THRR unchanged     Progression   Progression -- Continue to progress workloads to maintain intensity without signs/symptoms of physical distress. Continue to progress workloads to maintain intensity without signs/symptoms of physical distress. Continue to progress workloads to maintain intensity without signs/symptoms of physical distress. Continue to progress workloads to maintain intensity without signs/symptoms of physical distress.   Average METs -- 2.82 3.09 3.35 3.2     Resistance Training   Training Prescription -- Yes Yes Yes Yes   Weight -- 5 lb 5 lb 5 lb 5 lb   Reps -- 10-15 10-15 10-15 10-15     Interval Training   Interval Training -- No No No No     Treadmill   MPH -- 2.5 2.5 2.5 2.7   Grade -- 0 0 1 0.5   Minutes -- 15 15 15 15    METs -- 2.91 2.91 3.26 3.25     Recumbant Bike   Level -- 3 5 5  --   Watts -- 29 43 40 --   Minutes -- 15 15 15  --   METs -- 2.86 3.47 3.49 --     NuStep   Level -- 2  T6 nustep 5  T6 nustep 7 7   Minutes -- 15 15 15 15    METs -- 2.3 3.5 5.5 4.5     T5 Nustep   Level -- -- -- 6 --   Minutes -- -- -- 15 --   METs -- -- -- 3.1 --     Biostep-RELP   Level -- 1 -- -- 7   Minutes -- 15 -- -- 15   METs -- 3 -- -- 4     Rower   Level -- -- -- -- 1   Watts -- -- -- -- 18   Minutes -- -- -- -- 15    METs -- -- -- -- 4.12     Track   Laps -- -- -- -- 29   Minutes -- -- -- -- 15   METs -- -- -- -- 2.58     Oxygen   Maintain Oxygen Saturation -- 88% or higher 88% or higher 88% or higher 88% or higher    Row Name 01/27/24 1000 01/30/24 1700           Response to Exercise   Blood Pressure (Admit) 100/58 100/58      Blood Pressure (Exit) 100/52 100/52      Heart Rate (Admit) 57 bpm 57 bpm      Heart Rate (Exercise) 102 bpm 102 bpm      Heart Rate (Exit) 78 bpm 78 bpm      Rating of Perceived Exertion (Exercise) 13 13      Symptoms none none      Duration Continue with 30 min of aerobic exercise without signs/symptoms of physical distress. Continue with 30 min of aerobic exercise without signs/symptoms of physical distress.      Intensity THRR unchanged THRR unchanged        Progression   Progression Continue to progress workloads to maintain intensity without signs/symptoms of physical distress. Continue to progress workloads to maintain intensity  without signs/symptoms of physical distress.      Average METs 3.66 3.66        Resistance Training   Training Prescription Yes Yes      Weight 5 lb 5 lb      Reps 10-15 10-15        Interval Training   Interval Training No No        Oxygen   Oxygen -- Continuous        Treadmill   MPH 2.8 2.8      Grade 0 0      Minutes 15 15      METs 3.14 3.14        Recumbant Bike   Level 4 4      Watts 33 33      Minutes 15 15      METs 3.1 3.1        NuStep   Level 5 5      Minutes 15 15      METs 4.7 4.7        REL-XR   Level 1 --      Minutes 15 --      METs 6.9 --        T5 Nustep   Level 3 3      Minutes 15 15      METs 2.2 2.2        Biostep-RELP   Level 7 7      Minutes 15 15      METs 4 4        Oxygen   Maintain Oxygen Saturation 88% or higher 88% or higher         Exercise Comments:   Exercise Comments     Row Name 11/08/23 1110           Exercise Comments First full day of exercise!   Patient was oriented to gym and equipment including functions, settings, policies, and procedures.  Patient's individual exercise prescription and treatment plan were reviewed.  All starting workloads were established based on the results of the 6 minute walk test done at initial orientation visit.  The plan for exercise progression was also introduced and progression will be customized based on patient's performance and goals.          Exercise Goals and Review:   Exercise Goals     Row Name 10/30/23 1104             Exercise Goals   Increase Physical Activity Yes       Intervention Develop an individualized exercise prescription for aerobic and resistive training based on initial evaluation findings, risk stratification, comorbidities and participant's personal goals.;Provide advice, education, support and counseling about physical activity/exercise needs.       Expected Outcomes Short Term: Attend rehab on a regular basis to increase amount of physical activity.;Long Term: Add in home exercise to make exercise part of routine and to increase amount of physical activity.;Long Term: Exercising regularly at least 3-5 days a week.       Increase Strength and Stamina Yes       Intervention Provide advice, education, support and counseling about physical activity/exercise needs.;Develop an individualized exercise prescription for aerobic and resistive training based on initial evaluation findings, risk stratification, comorbidities and participant's personal goals.       Expected Outcomes Short Term: Increase workloads from initial exercise prescription for resistance, speed, and METs.;Short Term: Perform resistance training exercises routinely during  rehab and add in resistance training at home;Long Term: Improve cardiorespiratory fitness, muscular endurance and strength as measured by increased METs and functional capacity ( )       Able to understand and use rate of perceived exertion (RPE)  scale Yes       Intervention Provide education and explanation on how to use RPE scale       Expected Outcomes Short Term: Able to use RPE daily in rehab to express subjective intensity level;Long Term:  Able to use RPE to guide intensity level when exercising independently       Able to understand and use Dyspnea scale Yes       Intervention Provide education and explanation on how to use Dyspnea scale       Expected Outcomes Short Term: Able to use Dyspnea scale daily in rehab to express subjective sense of shortness of breath during exertion;Long Term: Able to use Dyspnea scale to guide intensity level when exercising independently       Knowledge and understanding of Target Heart Rate Range (THRR) Yes       Intervention Provide education and explanation of THRR including how the numbers were predicted and where they are located for reference       Expected Outcomes Short Term: Able to state/look up THRR;Short Term: Able to use daily as guideline for intensity in rehab;Long Term: Able to use THRR to govern intensity when exercising independently       Able to check pulse independently Yes       Intervention Provide education and demonstration on how to check pulse in carotid and radial arteries.;Review the importance of being able to check your own pulse for safety during independent exercise       Expected Outcomes Short Term: Able to explain why pulse checking is important during independent exercise;Long Term: Able to check pulse independently and accurately       Understanding of Exercise Prescription Yes       Intervention Provide education, explanation, and written materials on patient's individual exercise prescription       Expected Outcomes Short Term: Able to explain program exercise prescription;Long Term: Able to explain home exercise prescription to exercise independently          Exercise Goals Re-Evaluation :  Exercise Goals Re-Evaluation     Row Name 11/08/23 1111 11/21/23  1610 12/03/23 1358 12/17/23 1517 12/23/23 1127     Exercise Goal Re-Evaluation   Exercise Goals Review Increase Physical Activity;Able to understand and use rate of perceived exertion (RPE) scale;Knowledge and understanding of Target Heart Rate Range (THRR);Understanding of Exercise Prescription;Increase Strength and Stamina;Able to understand and use Dyspnea scale;Able to check pulse independently Increase Physical Activity;Increase Strength and Stamina;Understanding of Exercise Prescription Increase Physical Activity;Increase Strength and Stamina;Understanding of Exercise Prescription Increase Physical Activity;Increase Strength and Stamina;Understanding of Exercise Prescription Increase Physical Activity;Able to understand and use rate of perceived exertion (RPE) scale;Knowledge and understanding of Target Heart Rate Range (THRR);Understanding of Exercise Prescription;Increase Strength and Stamina;Able to understand and use Dyspnea scale;Able to check pulse independently   Comments Reviewed RPE and dyspnea scale, THR and program prescription with pt today.  Pt voiced understanding and was given a copy of goals to take home. Marcey is off to a good start in the program. He began using the treadmill at a speed of 2.5 mph with no incline. He also did well on the recumbent bike at level 3 and the T6 nustep at level 2. We will continue  to monitor his progress in the program. Marcey is doing well in rehab. He improved to level 5 on both the T6 nustep and the recumbent bike. He also continues to walk on the treadmill at a speed of 2.5 mph with no incline. We will continue to monitor his progress in the program. Marcey returned to rehab on 12/16/2023 for the first time after not attending since 11/22/2023. We will continue to monitor his progress in the program. Reviewed home exercise with pt today.  Pt plans to walk for exercise.  Reviewed THR, pulse, RPE, sign and symptoms, pulse oximetery and when to call 911 or MD.   Also discussed weather considerations and indoor options.  Pt voiced understanding.   Expected Outcomes Short: Use RPE daily to regulate intensity. Long: Follow program prescription in THR. Short: Continue to follow current exercise prescription. Long: Continue exercise to improve strength and stamina. Short: Begin to progressively increase treadmill workload. Long: Continue exercise to improve strength and stamina. Short: Return to regular attendance in rehab. Long: Continue exercise to improve strength and stamina. Short: add 1-2 days a week of exercise at home on off days of rehab. Long: maintain independent exercise routine upon graduation from cardiac rehab.    Row Name 01/02/24 1645 01/16/24 1546 01/27/24 1049 01/27/24 1123 01/30/24 1720     Exercise Goal Re-Evaluation   Exercise Goals Review Increase Physical Activity;Understanding of Exercise Prescription;Increase Strength and Stamina Increase Physical Activity;Understanding of Exercise Prescription;Increase Strength and Stamina Increase Physical Activity;Understanding of Exercise Prescription;Increase Strength and Stamina Increase Physical Activity;Increase Strength and Stamina;Understanding of Exercise Prescription Increase Physical Activity;Increase Strength and Stamina;Understanding of Exercise Prescription   Comments Marcey is doing well in rehab. He increased his workload on the treadmill to a speed of 2.5 mph and incline of 1%. He increased to level 6 on the T5 nustep and level 7 on the T4 nustep. He maintained level 5 on the bike. We will continue to monitor his progress in the program. Marcey continues to do well in rehab. He has recently been able to increase his treadmill workload to a speed of 2. and 0.5% incline. He was also able to increase from level 1 to 7 on the biostep. We will continue to monitor his progress in the program. Marcey continues to do well in rehab. He is due for his post soon and hopes to improve. He was able to  maintain level 7 on the biostep and added the XR at level 1. He increased his speed on the treadmill to 2.8 mph with no incline. He decreased slightly on the recumbent bike to level 4 and down to level 5 on the T4 nustep. We will continue to monitor his progress in the program. Marcey did his post 6 MWT today. He improved by 110 ft. He plans to continue to exercise at [employer] upon graduation from cardaic rehab. Marcey is doing well in rehab. He was recently able to complete his post-6MWT where he improved by 164ft. He was able to maintain his treadmill workload at a speed of 2. and 0% incline. We will continue to monitor his progress in the program.   Expected Outcomes Short: Continue to progressively increase treadmill and nustep workloads. Long: Continue exercise to improve strength and stamina. Short: Continue to increase treadmill workload. Long: Continue exercise to improve strength and stamina. Short: Improve on post . Long: Continue to increase overall METs and stamina. Short: graduate. Long: maintain independent exercise routine upon graduation. Short: Graduate. Long: Continue  exercise to improve strength and stamina.      Discharge Exercise Prescription (Final Exercise Prescription Changes):  Exercise Prescription Changes - 01/30/24 1700       Response to Exercise   Blood Pressure (Admit) 100/58    Blood Pressure (Exit) 100/52    Heart Rate (Admit) 57 bpm    Heart Rate (Exercise) 102 bpm    Heart Rate (Exit) 78 bpm    Rating of Perceived Exertion (Exercise) 13    Symptoms none    Duration Continue with 30 min of aerobic exercise without signs/symptoms of physical distress.    Intensity THRR unchanged      Progression   Progression Continue to progress workloads to maintain intensity without signs/symptoms of physical distress.    Average METs 3.66      Resistance Training   Training Prescription Yes    Weight 5 lb    Reps 10-15      Interval Training   Interval  Training No      Oxygen   Oxygen Continuous      Treadmill   MPH 2.8    Grade 0    Minutes 15    METs 3.14      Recumbant Bike   Level 4    Watts 33    Minutes 15    METs 3.1      NuStep   Level 5    Minutes 15    METs 4.7      T5 Nustep   Level 3    Minutes 15    METs 2.2      Biostep-RELP   Level 7    Minutes 15    METs 4      Oxygen   Maintain Oxygen Saturation 88% or higher          Nutrition:  Target Goals: Understanding of nutrition guidelines, daily intake of sodium 1500mg , cholesterol 200mg , calories 30% from fat and 7% or less from saturated fats, daily to have 5 or more servings of fruits and vegetables.  Education: Nutrition 1 -Group instruction provided by verbal, written material, interactive activities, discussions, models, and posters to present general guidelines for heart healthy nutrition including macronutrients, label reading, and promoting whole foods over processed counterparts. Education serves as pensions consultant of discussion of heart healthy eating for all. Written material provided at class time. Flowsheet Row Cardiac Rehab from 02/05/2024 in [facility] Cardiac and Pulmonary Rehab  Date 01/15/24  Educator jg  Instruction Review Code 1- Verbalizes Understanding     Education: Nutrition 2 -Group instruction provided by verbal, written material, interactive activities, discussions, models, and posters to present general guidelines for heart healthy nutrition including sodium, cholesterol, and saturated fat. Providing guidance of habit forming to improve blood pressure, cholesterol, and body weight. Written material provided at class time. Flowsheet Row Cardiac Rehab from 02/05/2024 in [facility] Cardiac and Pulmonary Rehab  Date 11/13/23  Educator jg  Instruction Review Code 1- Verbalizes Understanding      Biometrics:  Pre Biometrics - 10/30/23 1104       Pre Biometrics   Height 6' 0.6 (1.844 m)    Weight 202 lb 8 oz (91.9 kg)    Waist  Circumference 41 inches    Hip Circumference 41 inches    Waist to Hip Ratio 1 %    BMI (Calculated) 27.01    Single Leg Stand 14.9 seconds          Post Biometrics - 01/27/24 1124  Post  Biometrics   Weight 211 lb 4.8 oz (95.8 kg)    Waist Circumference 43 inches    Hip Circumference 43 inches    Waist to Hip Ratio 1 %    BMI (Calculated) 28.65    Single Leg Stand 2.06 seconds          Nutrition Therapy Plan and Nutrition Goals:   Nutrition Assessments:  MEDIFICTS Score Key: >=70 Need to make dietary changes  40-70 Heart Healthy Diet <= 40 Therapeutic Level Cholesterol Diet  Flowsheet Row Cardiac Rehab from 01/31/2024 in [facility] Cardiac and Pulmonary Rehab  Picture Your Plate Total Score on Admission 53  Picture Your Plate Total Score on Discharge 61   Picture Your Plate Scores: <59 Unhealthy dietary pattern with much room for improvement. 41-50 Dietary pattern unlikely to meet recommendations for good health and room for improvement. 51-60 More healthful dietary pattern, with some room for improvement.  >60 Healthy dietary pattern, although there may be some specific behaviors that could be improved.    Nutrition Goals Re-Evaluation:  Nutrition Goals Re-Evaluation     Row Name 12/16/23 1113 01/15/24 1138 01/27/24 1137         Goals   Comment RD deferred RD deferred RD deferred        Nutrition Goals Discharge (Final Nutrition Goals Re-Evaluation):  Nutrition Goals Re-Evaluation - 01/27/24 1137       Goals   Comment RD deferred          Psychosocial: Target Goals: Acknowledge presence or absence of significant depression and/or stress, maximize coping skills, provide positive support system. Participant is able to verbalize types and ability to use techniques and skills needed for reducing stress and depression.   Education: Stress, Anxiety, and Depression - Group verbal and visual presentation to define topics covered.  Reviews how body is  impacted by stress, anxiety, and depression.  Also discusses healthy ways to reduce stress and to treat/manage anxiety and depression. Written material provided at class time.   Education: Sleep Hygiene -Provides group verbal and written instruction about how sleep can affect your health.  Define sleep hygiene, discuss sleep cycles and impact of sleep habits. Review good sleep hygiene tips.   Initial Review & Psychosocial Screening:  Initial Psych Review & Screening - 10/28/23 1412       Initial Review   Current issues with History of Depression;Current Stress Concerns    Comments Patient stated he has a good support system and currently takes wellbutrin . 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 He stated his wife has been very attentive to his recovery and health. Patient stated that he is now able to drive and that is helping him get back to normal. 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 Overall, patient stated he is doing well and does not have any thoughts or made any attempts to harm himself.      Family Dynamics   Good Support System? Yes    Comments wife is very supportive      Barriers   Psychosocial barriers to participate in program The patient should benefit from training in stress management and relaxation.   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  Quality of Life Scores:   Quality of Life - 01/31/24 0935       Quality of Life Scores   Health/Function Pre 22.83 %    Health/Function Post 19.87 %    Health/Function % Change -12.97 %    Socioeconomic Pre 20.3 %    Socioeconomic Post 27.5 %    Socioeconomic % Change  35.47 %    Psych/Spiritual Pre 24.69 %    Psych/Spiritual Post 21.36 %    Psych/Spiritual % Change -13.49 %    Family Pre 23.57 %     Family Post 21.6 %    Family % Change -8.36 %    GLOBAL Pre 26.4 %    GLOBAL Post 22 %    GLOBAL % Change -16.67 %         Scores of 19 and below usually indicate a poorer quality of life in these areas.  A difference of  2-3 points is a clinically meaningful difference.  A difference of 2-3 points in the total score of the Quality of Life Index has been associated with significant improvement in overall quality of life, self-image, physical symptoms, and general health in studies assessing change in quality of life.  PHQ-9: Review Flowsheet       01/27/2024 01/15/2024 12/16/2023 10/30/2023  Depression screen PHQ 2/9  Decreased Interest 1 1 1 1   Down, Depressed, Hopeless 0 0 0 1  PHQ - 2 Score 1 1 1 2   Altered sleeping 1 1 1 1   Tired, decreased energy 2 2 3 3   Change in appetite 1 1 1 2   Feeling bad or failure about yourself  0 1 0 0  Trouble concentrating 1 1 0 1  Moving slowly or fidgety/restless 1 1 1 1   Suicidal thoughts 0 0 0 0  PHQ-9 Score 7  8  7  10    Difficult doing work/chores Somewhat difficult Not difficult at all Not difficult at all Not difficult at all    Details       Data saved with a previous flowsheet row definition        Interpretation of Total Score  Total Score Depression Severity:  1-4 = Minimal depression, 5-9 = Mild depression, 10-14 = Moderate depression, 15-19 = Moderately severe depression, 20-27 = Severe depression   Psychosocial Evaluation and Intervention:  Psychosocial Evaluation - 10/28/23 1418       Psychosocial Evaluation & Interventions   Interventions Stress management education;Relaxation education;Encouraged to exercise with the program and follow exercise prescription    Comments Patient stated he has a good support system and currently takes wellbutrin . 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 He stated his wife has been very attentive to his recovery and health. Patient stated that he is now able to drive and  that is helping him get back to normal. 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 Overall, patient stated he is doing well and does not have any thoughts or made any attempts to harm himself.    Expected Outcomes ST: Attend cardiac rehab for education and exercise; LT: develop and maintain positive self-care habits    Continue Psychosocial Services  Follow up required by staff          Psychosocial Re-Evaluation:  Psychosocial Re-Evaluation     Row Name 12/16/23 1106 01/15/24 1143 01/27/24 1141  Psychosocial Re-Evaluation   Current issues with Current Sleep Concerns Current Sleep Concerns Current Sleep Concerns     Comments Marcey is doing well and states he has no stress concerns at this time. He is sleeping as well as he can, but has had ongoing sleep concerns his whole life. Steve's PHQ 9 was re-done. His score went from a 7 to an 8. He does reports that he has seasonal mood changes an takes something for that. He is not sure if he still needs to take it. He stated that he manages his symptoms and has  good support system. He was told by staff that if he needed any mental health resources to reach out to staff and community resources could be provided. Marcey reports that he does continue to take meds for depression and feels like it is stable. His PHQ went from an 8 to a 7. He states he has all the resources he needs to manage this area of his life and will continue to do so upon graduation from cardiac rehab.     Expected Outcomes Short: Continue to work to improve sleep, and limit stressors. Long: Continue to manage stressors of daily life through healthy means. Short: continue to attend cardiac rehab for the mental health benefits of exercise. Long: maintain good mental health routine. Short:: graduate. Long: maintain good mental health.     Interventions Encouraged to attend Cardiac Rehabilitation for the  exercise Encouraged to attend Cardiac Rehabilitation for the exercise Encouraged to attend Cardiac Rehabilitation for the exercise     Continue Psychosocial Services  Follow up required by staff No Follow up required --        Psychosocial Discharge (Final Psychosocial Re-Evaluation):  Psychosocial Re-Evaluation - 01/27/24 1141       Psychosocial Re-Evaluation   Current issues with Current Sleep Concerns    Comments Marcey reports that he does continue to take meds for depression and feels like it is stable. His PHQ went from an 8 to a 7. He states he has all the resources he needs to manage this area of his life and will continue to do so upon graduation from cardiac rehab.    Expected Outcomes Short:: graduate. Long: maintain good mental health.    Interventions Encouraged to attend Cardiac Rehabilitation for the exercise          Vocational Rehabilitation: Provide vocational rehab assistance to qualifying candidates.   Vocational Rehab Evaluation & Intervention:  Vocational Rehab - 10/28/23 1412       Initial Vocational Rehab Evaluation & Intervention   Assessment shows need for Vocational Rehabilitation No          Education: Education Goals: Education classes will be provided on a variety of topics geared toward better understanding of heart health and risk factor modification. Participant will state understanding/return demonstration of topics presented as noted by education test scores.  Learning Barriers/Preferences:  Learning Barriers/Preferences - 10/28/23 1411       Learning Barriers/Preferences   Learning Barriers None    Learning Preferences Individual Instruction;Group Instruction;Verbal Instruction;Video;Written Material;Computer/Internet;Audio          General Cardiac Education Topics:  AED/CPR: - Group verbal and written instruction with the use of models to demonstrate the basic use of the AED with the basic ABC's of resuscitation.   Test and  Procedures: - Group verbal and visual presentation and models provide information about basic cardiac anatomy and function. Reviews the testing methods done to diagnose heart disease and  the outcomes of the test results. Describes the treatment choices: Medical Management, Angioplasty, or Coronary Bypass Surgery for treating various heart conditions including Myocardial Infarction, Angina, Valve Disease, and Cardiac Arrhythmias. Written material provided at class time. Flowsheet Row Cardiac Rehab from 02/05/2024 in [facility] Cardiac and Pulmonary Rehab  Date 11/20/23  Educator KB  Instruction Review Code 1- Verbalizes Understanding    Medication Safety: - Group verbal and visual instruction to review commonly prescribed medications for heart and lung disease. Reviews the medication, class of the drug, and side effects. Includes the steps to properly store meds and maintain the prescription regimen. Written material provided at class time. Flowsheet Row Cardiac Rehab from 02/05/2024 in [facility] Cardiac and Pulmonary Rehab  Date 02/05/24  Educator KB  Instruction Review Code 1- Verbalizes Understanding    Intimacy: - Group verbal instruction through game format to discuss how heart and lung disease can affect sexual intimacy. Written material provided at class time.   Know Your Numbers and Heart Failure: - Group verbal and visual instruction to discuss disease risk factors for cardiac and pulmonary disease and treatment options.  Reviews associated critical values for Overweight/Obesity, Hypertension, Cholesterol, and Diabetes.  Discusses basics of heart failure: signs/symptoms and treatments.  Introduces Heart Failure Zone chart for action plan for heart failure. Written material provided at class time.   Infection Prevention: - Provides verbal and written material to individual with discussion of infection control including proper hand washing and proper equipment cleaning during exercise  session. Flowsheet Row Cardiac Rehab from 02/05/2024 in [facility] Cardiac and Pulmonary Rehab  Date 10/30/23  Educator [facility]  Instruction Review Code 1- Verbalizes Understanding    Falls Prevention: - Provides verbal and written material to individual with discussion of falls prevention and safety. Flowsheet Row Cardiac Rehab from 02/05/2024 in [facility] Cardiac and Pulmonary Rehab  Date 10/30/23  Educator [facility]  Instruction Review Code 1- Verbalizes Understanding    Other: -Provides group and verbal instruction on various topics (see comments)   Knowledge Questionnaire Score:  Knowledge Questionnaire Score - 01/31/24 0935       Knowledge Questionnaire Score   Pre Score 25/26    Post Score 26/26          Core Components/Risk Factors/Patient Goals at Admission:  Personal Goals and Risk Factors at Admission - 10/28/23 1410       Core Components/Risk Factors/Patient Goals on Admission    Weight Management Weight Loss   current weight 196, would like to weigh 180; Also interested in reducing dose of statin as 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    Expected Outcomes Short Term: Continued assessment and intervention until BP is < 140/34mm HG in hypertensive participants. < 130/17mm HG in hypertensive participants with diabetes, heart failure or chronic kidney disease.;Long Term: Maintenance of blood pressure at goal levels.    Lipids Yes    Intervention Provide education and support for participant on nutrition & aerobic/resistive exercise along with prescribed medications to achieve LDL 70mg , HDL >40mg .    Expected Outcomes Short Term: Participant states understanding of desired cholesterol values  and is compliant with medications prescribed. Participant is following exercise prescription and nutrition guidelines.;Long Term: Cholesterol controlled with medications as prescribed, with individualized exercise RX and with personalized nutrition  plan. Value goals: LDL < 70mg , HDL > 40 mg.    Stress Yes    Intervention Offer individual and/or small group education and counseling on adjustment to heart disease, stress management and health-related lifestyle change. Teach and support self-help strategies.;Refer participants experiencing significant psychosocial distress to appropriate mental health specialists for further evaluation and treatment. When possible, include family members and significant others in education/counseling sessions.    Expected Outcomes Short Term: Participant demonstrates changes in health-related behavior, relaxation and other stress management skills, ability to obtain effective social support, and compliance with psychotropic medications if prescribed.;Long Term: Emotional wellbeing is indicated by absence of clinically significant psychosocial distress or social isolation.          Education:Diabetes - Individual verbal and written instruction to review signs/symptoms of diabetes, desired ranges of glucose level fasting, after meals and with exercise. Acknowledge that pre and post exercise glucose checks will be done for 3 sessions at entry of program.   Core Components/Risk Factors/Patient Goals Review:   Goals and Risk Factor Review     Row Name 12/16/23 1115 01/15/24 1138 01/27/24 1135         Core Components/Risk Factors/Patient Goals Review   Personal Goals Review Hypertension;Weight Management/Obesity Hypertension;Weight Management/Obesity Hypertension     Review Marcey states that he has not been taking his BP as frequently as of late. He was taking it at least once a day for the first couple of weeks after his surgery but has reduced his frequency to only  when he feels off. He also states that he is trying to ultimately lose about 20lbs, but has just recently recovered from a sickness and is excited to start exercising again to work towards his weight goals. Marcey was encouraged to start getting in the habit of taking his BP at home a few days a week. He is not currently doing this. He states his weight has not decreased yet. Marcey is getting ready to graduate from cardiac rehab. He states he occationally checks his BP at home. He was encouarged to do so consistently upon graduation.     Expected Outcomes Short: Lose 5lbs, take BP at home more frequently. Long: Lose 20lbs. Short: Lose 5lbs, state taking BP at home. Long: control cardaic risk factors. Short: graduate Long: control cardiac risk factors and continue to monitor BP at home.        Core Components/Risk Factors/Patient Goals at Discharge (Final Review):   Goals and Risk Factor Review - 01/27/24 1135       Core Components/Risk Factors/Patient Goals Review   Personal Goals Review Hypertension    Review Marcey is getting ready to graduate from cardiac rehab. He states he occationally checks his BP at home. He was encouarged to do so consistently upon graduation.    Expected Outcomes Short: graduate Long: control cardiac risk factors and continue to monitor BP at home.          ITP Comments:  ITP Comments     Row Name 10/28/23 1424 10/30/23 1056 11/08/23 1110 11/20/23 1045 12/18/23 1119   ITP Comments Initial phone call completed. Diagnosis can be found in [facility] 09/16/2023. EP Orientation scheduled for Wednesday, October 30, 2023, @ 0900. Completed and gym orientation for cardiac rehab. Initial ITP created and sent for review to Dr. Oneil Pinal, Medical Director. First full day of exercise!  Patient was oriented to gym and equipment including functions, settings, policies, and procedures.  Patient's individual exercise prescription and treatment plan were  reviewed.  All starting workloads  were established based on the results of the 6 minute walk test done at initial orientation visit.  The plan for exercise progression was also introduced and progression will be customized based on patient's performance and goals. Virtual Visit completed. Patient informed on EP and RD appointment and 6 Minute walk test. Patient also informed of patient health questionnaires on My Chart. Patient Verbalizes understanding. For visist on 08/28/2023. New to program. 30 Day review completed. Medical Director ITP review done; changes made as directed and signed approval by Medical Director. New to program.    Row Name 01/15/24 1056 02/12/24 1003         ITP Comments 30 Day review completed. Medical Director ITP review done, changes made as directed, and signed approval by Medical Director. 30 Day review completed. Medical Director ITP review done, changes made as directed, and signed approval by Medical Director.         Comments: 30 day review.

## 2024-02-12 NOTE — Progress Notes (Signed)
 Cardiac Individual Treatment Plan  Patient Details  Name: Allen Tapia MRN: [id number] Date of Birth: 11-May-1955 Referring Provider:   Flowsheet Row Cardiac Rehab from 10/30/2023 in [facility] Cardiac and Pulmonary Rehab  Referring Provider Dr. Shelda Bruckner    Initial Encounter Date:  Flowsheet Row Cardiac Rehab from 10/30/2023 in [facility] Cardiac and Pulmonary Rehab  Date 10/30/23    Visit Diagnosis: S/P CABG x 2  Patient's Home Medications on Admission:  Current Outpatient Medications:    acetaminophen  (TYLENOL ) 500 MG tablet, Take 2 tablets (1,000 mg total) by mouth every 6 (six) hours as needed for mild pain., Disp: 60 tablet, Rfl: 0   amphetamine -dextroamphetamine (ADDERALL XR) 25 MG 24 hr capsule, Take 25 mg by mouth daily as needed (focus)., Disp: , Rfl:    apixaban  (ELIQUIS ) 5 MG TABS tablet, Take 1 tablet (5 mg total) by mouth 2 (two) times daily., Disp: 180 tablet, Rfl: 1   aspirin  EC 81 MG tablet, Take 81 mg by mouth daily. Swallow whole., Disp: , Rfl:    baclofen  (LIORESAL ) 20 MG tablet, Take 30 mg by mouth 3 (three) times daily., Disp: , Rfl:    BREZTRI  AEROSPHERE 160-9-4.8 MCG/ACT AERO, USE 2 INHALATIONS IN THE MORNING AND AT BEDTIME, Disp: 32.1 g, Rfl: 3   buPROPion  (WELLBUTRIN  XL) 150 MG 24 hr tablet, Take 150 mg by mouth daily., Disp: , Rfl:    gabapentin  (NEURONTIN ) 300 MG capsule, Take 1-2 capsules (300-600 mg total) by mouth 2 (two) times daily. 600 mg in am and 300 mg in afternoon, Disp: 60 capsule, Rfl: 0   metoprolol  tartrate (LOPRESSOR ) 25 MG tablet, Take 0.5 tablets (12.5 mg total) by mouth 2 (two) times daily., Disp: 90 tablet, Rfl: 0   montelukast  (SINGULAIR ) 10 MG tablet, TAKE 1 TABLET AT BEDTIME, Disp: 90 tablet, Rfl: 0   Multiple Vitamins-Minerals (MULTIVITAMIN WITH MINERALS) tablet, Take 1 tablet by mouth daily., Disp: , Rfl:    Ofatumumab (KESIMPTA) 20 MG/0.4ML SOAJ, Inject 20 mg into the skin every 30 (thirty) days. 1st day of every month, Disp: , Rfl:     pantoprazole  (PROTONIX ) 20 MG tablet, Take 20 mg by mouth at bedtime., Disp: , Rfl:    Polyethyl Glycol-Propyl Glycol (SYSTANE) 0.4-0.3 % SOLN, Place 1 drop into both eyes 4 (four) times daily as needed (dry eyes). (Patient not taking: Reported on 01/13/2024), Disp: , Rfl:    rosuvastatin  (CRESTOR ) 40 MG tablet, Take 40 mg by mouth at bedtime., Disp: , Rfl:    Spacer/Aero-Holding Chambers (AEROCHAMBER MV) inhaler, Use as instructed, Disp: 1 each, Rfl: 0   tamsulosin  (FLOMAX ) 0.4 MG CAPS capsule, TAKE 1 CAPSULE DAILY, Disp: 90 capsule, Rfl: 3   traZODone  (DESYREL ) 100 MG tablet, Take 100 mg by mouth at bedtime., Disp: , Rfl:    VENTOLIN  HFA 108 (90 Base) MCG/ACT inhaler, Inhale 2 puffs into the lungs every 4 (four) hours as needed for wheezing or shortness of breath., Disp: 3 each, Rfl: 4   Vibegron  (GEMTESA ) 75 MG TABS, Take 1 tablet (75 mg total) by mouth daily., Disp: 90 tablet, Rfl: 3   VITAMIN D  PO, Take 1 capsule by mouth daily., Disp: , Rfl:   Past Medical History: Past Medical History:  Diagnosis Date   Anginal pain    atypical chest pain cardiac cath performed at that time   Arthritis    Asthma    Atypical chest pain    Barrett's esophagus    Bilateral cataracts    BPH (benign  prostatic hyperplasia)    Cancer (HCC)    skin ca in face and hand   Chicken pox    Chronic constipation    no current problem per pt 09/12/23   COPD (chronic obstructive pulmonary disease) (HCC)    Coronary artery disease 07/14/2007   a.) LHC 07/14/2007: 40% pRCA-1, 40% pRCA-2, 40% dRCA, 20% RPL1, 30% LM, 20% mLCx-1, 40% mLCx-2, 20% OM3-1, 20% OM3-2, 20% pLAD, 40% mLAD-1, 70% mLAD-2, 40% mLAD-3, 30% dLAD, 40% D1, 40% D2 --> med mgmt.   Depression    Foreign body (FB) in soft tissue    Former tobacco use    GERD (gastroesophageal reflux disease)    History of 2019 novel coronavirus disease (COVID-19) 04/07/2019   a.) tested (+) at [facility] 04/16/2019, 05/30/2021; b.) 2 other (+) tests at home  (dates unknown)   History of kidney stones    Hyperlipidemia    Hyperplastic colon polyp    Hypertension    Infrarenal abdominal aortic aneurysm (AAA) without rupture    a.) MR lumbar spine 12/30/2020: infrarenal saccular AAA measuring 2.2 cm; b.) CTA A/P 01/05/2021: measured 2.6 cm; c.) s/p EVAR 03/03/2021   Ischemic heart disease 12/20/2010   Long term current use of immunosuppressive drug    a.) on ofatumumab for MS   MS (multiple sclerosis)    a.) on ofatumumab   Neuromuscular disorder (HCC)    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 - takes gabapentin    Pneumonia    x several   Pre-diabetes    Shingles    Tubular adenoma of colon 08/23/2016    Tobacco Use: Social History   Tobacco Use  Smoking Status Former   Current packs/day: 0.00   Average packs/day: 1 pack/day for 32.5 years (32.5 ttl pk-yrs)   Types: Cigarettes   Start date: 37   Quit date: 09/23/2006   Years since quitting: 17.4  Smokeless Tobacco Never    Labs: Review Flowsheet  More data may exist      Latest Ref Rng & Units 02/28/2021 06/06/2022 09/12/2023 09/16/2023 09/17/2023  Labs for ITP Cardiac and Pulmonary Rehab  Cholestrol 0 - 200 mg/dL - - - - 56   LDL (calc) 0 - 99 mg/dL - - - - 19   HDL-C >59 mg/dL - - - - 29   Trlycerides <150 mg/dL - - - - 40   Hemoglobin A1c 4.8 - 5.6 % - 6.1  5.8  - 5.9   PH, Arterial 7.35 - 7.45 7.383  - - 7.309  7.284  7.358  7.345  7.406  7.394  7.335  -  PCO2 arterial 32 - 48 mmHg 44.2  - - 40.9  48.1  41.1  42.9  39.4  39.5  42.9  -  Bicarbonate 20.0 - 28.0 mmol/L 25.7  - - 20.6  23.0  23.1  23.4  24.7  24.4  24.2  22.9  -  TCO2 22 - 32 mmol/L - - - 22  24  24  21  25  26  24  26  25  24  22  24  26   -  Acid-base deficit 0.0 - 2.0 mmol/L - - - 5.0  4.0  2.0  2.0  1.0  1.0  3.0  -  O2 Saturation % 97.1  - - 96  100  99  95  100  76  100  100  -    Details       Multiple values from one day  are sorted in reverse-chronological order          Exercise Target Goals: Exercise Program  Goal: Individual exercise prescription set using results from initial 6 min walk test and THRR while considering  patient's activity barriers and safety.   Exercise Prescription Goal: Initial exercise prescription builds to 30-45 minutes a day of aerobic activity, 2-3 days per week.  Home exercise guidelines will be given to patient during program as part of exercise prescription that the participant will acknowledge.   Education: Aerobic Exercise: - Group verbal and visual presentation on the components of exercise prescription. Introduces F.I.T.T principle from ACSM for exercise prescriptions.  Reviews F.I.T.T. principles of aerobic exercise including progression. Written material provided at class time.   Education: Resistance Exercise: - Group verbal and visual presentation on the components of exercise prescription. Introduces F.I.T.T principle from ACSM for exercise prescriptions  Reviews F.I.T.T. principles of resistance exercise including progression. Written material provided at class time. Flowsheet Row Cardiac Rehab from 02/05/2024 in [facility] Cardiac and Pulmonary Rehab  Date 01/01/24  Educator nt  Instruction Review Code 1- Tefl Teacher Understanding     Education: Exercise & Equipment Safety: - Individual verbal instruction and demonstration of equipment use and safety with use of the equipment. Flowsheet Row Cardiac Rehab from 02/05/2024 in [facility] Cardiac and Pulmonary Rehab  Date 10/30/23  Educator [facility]  Instruction Review Code 1- Verbalizes Understanding    Education: Exercise Physiology & General Exercise Guidelines: - Group verbal and written instruction with models to review the exercise physiology of the cardiovascular system and associated critical values. Provides general exercise guidelines with specific guidelines to those with heart or lung disease. Written material provided at class time. Flowsheet Row Cardiac Rehab from 02/05/2024 in [facility] Cardiac and Pulmonary Rehab   Date 12/25/23  Educator nt  Instruction Review Code 1- Tefl Teacher Understanding    Education: Flexibility, Balance, Mind/Body Relaxation: - Group verbal and visual presentation with interactive activity on the components of exercise prescription. Introduces F.I.T.T principle from ACSM for exercise prescriptions. Reviews F.I.T.T. principles of flexibility and balance exercise training including progression. Also discusses the mind body connection.  Reviews various relaxation techniques to help reduce and manage stress (i.e. Deep breathing, progressive muscle relaxation, and visualization). Balance handout provided to take home. Written material provided at class time. Flowsheet Row Cardiac Rehab from 02/05/2024 in [facility] Cardiac and Pulmonary Rehab  Date 01/01/24  Educator nt  Instruction Review Code 1- Verbalizes Understanding    Activity Barriers & Risk Stratification:  Activity Barriers & Cardiac Risk Stratification - 10/30/23 1059       Activity Barriers & Cardiac Risk Stratification   Activity Barriers Arthritis;Right Knee Replacement;Left Knee Replacement;Joint Problems;Other (comment)    Comments hx of arthritis in R hand, elbow; also has MS but generally has no pain    Cardiac Risk Stratification High          6 Minute Walk:  6 Minute Walk     Row Name 10/30/23 1057 01/27/24 1121       6 Minute Walk   Phase Initial Discharge    Distance 1395 feet 1505 feet    Distance % Change -- 8 %    Distance Feet Change -- 110 ft    Walk Time 6 minutes 6 minutes    # of Rest Breaks 0 0    MPH 2.64 2.85    METS 3.48 3.4    RPE 11 13    Perceived Dyspnea  1 1  VO2 Peak 12.2 11.9    Symptoms No No    Resting HR 59 bpm 65 bpm    Resting BP 124/62 122/82    Resting Oxygen Saturation  93 % 93 %    Exercise Oxygen Saturation  during 6 min walk 97 % 91 %    Max Ex. HR 115 bpm 105 bpm    Max Ex. BP 136/62 124/72    2 Minute Post BP 128/64 100/62       Oxygen Initial  Assessment:   Oxygen Re-Evaluation:   Oxygen Discharge (Final Oxygen Re-Evaluation):   Initial Exercise Prescription:  Initial Exercise Prescription - 10/30/23 1000       Date of Initial Exercise RX and Referring Provider   Date 10/30/23    Referring Provider Dr. Shelda Bruckner      Oxygen   Maintain Oxygen Saturation 88% or higher      Treadmill   MPH 2.5    Grade 0    Minutes 15    METs 2.91      Recumbant Bike   Level 2    RPM 50    Watts 25    Minutes 15    METs 3.5      NuStep   Level 2    SPM 80    Minutes 15    METs 3.5      T5 Nustep   Level 2    SPM 80    Minutes 15    METs 3.5      Biostep-RELP   Level 2    SPM 50    Minutes 15    METs 3.5      Track   Laps 35    Minutes 15    METs 2.9      Prescription Details   Duration Progress to 30 minutes of continuous aerobic without signs/symptoms of physical distress      Intensity   THRR 40-80% of Max Heartrate 96-133    Ratings of Perceived Exertion 11-13    Perceived Dyspnea 0-4      Progression   Progression Continue to progress workloads to maintain intensity without signs/symptoms of physical distress.      Resistance Training   Training Prescription Yes    Weight 5lb    Reps 10-15          Perform Capillary Blood Glucose checks as needed.  Exercise Prescription Changes:   Exercise Prescription Changes     Row Name 10/30/23 1100 11/21/23 1600 12/03/23 1300 01/02/24 1600 01/16/24 1500     Response to Exercise   Blood Pressure (Admit) 124/62 102/54 112/62 104/66 112/68   Blood Pressure (Exercise) 136/62 126/52 128/70 112/60 --   Blood Pressure (Exit) 128/64 102/54 102/58 106/66 104/56   Heart Rate (Admit) 59 bpm 58 bpm 53 bpm 58 bpm 56 bpm   Heart Rate (Exercise) 115 bpm 97 bpm 99 bpm 99 bpm 106 bpm   Heart Rate (Exit) 60 bpm 68 bpm 57 bpm 59 bpm 71 bpm   Oxygen Saturation (Admit) 93 % -- -- -- --   Oxygen Saturation (Exercise) 97 % -- -- -- --   Oxygen  Saturation (Exit) 97 % -- -- -- --   Rating of Perceived Exertion (Exercise) 11 13 13 14 13    Perceived Dyspnea (Exercise) 1 -- -- -- --   Symptoms none none none none none   Comments results First three days of exercise -- -- --   Duration --  Continue with 30 min of aerobic exercise without signs/symptoms of physical distress. Continue with 30 min of aerobic exercise without signs/symptoms of physical distress. Continue with 30 min of aerobic exercise without signs/symptoms of physical distress. Continue with 30 min of aerobic exercise without signs/symptoms of physical distress.   Intensity -- THRR unchanged THRR unchanged THRR unchanged THRR unchanged     Progression   Progression -- Continue to progress workloads to maintain intensity without signs/symptoms of physical distress. Continue to progress workloads to maintain intensity without signs/symptoms of physical distress. Continue to progress workloads to maintain intensity without signs/symptoms of physical distress. Continue to progress workloads to maintain intensity without signs/symptoms of physical distress.   Average METs -- 2.82 3.09 3.35 3.2     Resistance Training   Training Prescription -- Yes Yes Yes Yes   Weight -- 5 lb 5 lb 5 lb 5 lb   Reps -- 10-15 10-15 10-15 10-15     Interval Training   Interval Training -- No No No No     Treadmill   MPH -- 2.5 2.5 2.5 2.7   Grade -- 0 0 1 0.5   Minutes -- 15 15 15 15    METs -- 2.91 2.91 3.26 3.25     Recumbant Bike   Level -- 3 5 5  --   Watts -- 29 43 40 --   Minutes -- 15 15 15  --   METs -- 2.86 3.47 3.49 --     NuStep   Level -- 2  T6 nustep 5  T6 nustep 7 7   Minutes -- 15 15 15 15    METs -- 2.3 3.5 5.5 4.5     T5 Nustep   Level -- -- -- 6 --   Minutes -- -- -- 15 --   METs -- -- -- 3.1 --     Biostep-RELP   Level -- 1 -- -- 7   Minutes -- 15 -- -- 15   METs -- 3 -- -- 4     Rower   Level -- -- -- -- 1   Watts -- -- -- -- 18   Minutes -- -- -- -- 15    METs -- -- -- -- 4.12     Track   Laps -- -- -- -- 29   Minutes -- -- -- -- 15   METs -- -- -- -- 2.58     Oxygen   Maintain Oxygen Saturation -- 88% or higher 88% or higher 88% or higher 88% or higher    Row Name 01/27/24 1000 01/30/24 1700           Response to Exercise   Blood Pressure (Admit) 100/58 100/58      Blood Pressure (Exit) 100/52 100/52      Heart Rate (Admit) 57 bpm 57 bpm      Heart Rate (Exercise) 102 bpm 102 bpm      Heart Rate (Exit) 78 bpm 78 bpm      Rating of Perceived Exertion (Exercise) 13 13      Symptoms none none      Duration Continue with 30 min of aerobic exercise without signs/symptoms of physical distress. Continue with 30 min of aerobic exercise without signs/symptoms of physical distress.      Intensity THRR unchanged THRR unchanged        Progression   Progression Continue to progress workloads to maintain intensity without signs/symptoms of physical distress. Continue to progress workloads to maintain intensity  without signs/symptoms of physical distress.      Average METs 3.66 3.66        Resistance Training   Training Prescription Yes Yes      Weight 5 lb 5 lb      Reps 10-15 10-15        Interval Training   Interval Training No No        Oxygen   Oxygen -- Continuous        Treadmill   MPH 2.8 2.8      Grade 0 0      Minutes 15 15      METs 3.14 3.14        Recumbant Bike   Level 4 4      Watts 33 33      Minutes 15 15      METs 3.1 3.1        NuStep   Level 5 5      Minutes 15 15      METs 4.7 4.7        REL-XR   Level 1 --      Minutes 15 --      METs 6.9 --        T5 Nustep   Level 3 3      Minutes 15 15      METs 2.2 2.2        Biostep-RELP   Level 7 7      Minutes 15 15      METs 4 4        Oxygen   Maintain Oxygen Saturation 88% or higher 88% or higher         Exercise Comments:   Exercise Comments     Row Name 11/08/23 1110           Exercise Comments First full day of exercise!   Patient was oriented to gym and equipment including functions, settings, policies, and procedures.  Patient's individual exercise prescription and treatment plan were reviewed.  All starting workloads were established based on the results of the 6 minute walk test done at initial orientation visit.  The plan for exercise progression was also introduced and progression will be customized based on patient's performance and goals.          Exercise Goals and Review:   Exercise Goals     Row Name 10/30/23 1104             Exercise Goals   Increase Physical Activity Yes       Intervention Develop an individualized exercise prescription for aerobic and resistive training based on initial evaluation findings, risk stratification, comorbidities and participant's personal goals.;Provide advice, education, support and counseling about physical activity/exercise needs.       Expected Outcomes Short Term: Attend rehab on a regular basis to increase amount of physical activity.;Long Term: Add in home exercise to make exercise part of routine and to increase amount of physical activity.;Long Term: Exercising regularly at least 3-5 days a week.       Increase Strength and Stamina Yes       Intervention Provide advice, education, support and counseling about physical activity/exercise needs.;Develop an individualized exercise prescription for aerobic and resistive training based on initial evaluation findings, risk stratification, comorbidities and participant's personal goals.       Expected Outcomes Short Term: Increase workloads from initial exercise prescription for resistance, speed, and METs.;Short Term: Perform resistance training exercises routinely during  rehab and add in resistance training at home;Long Term: Improve cardiorespiratory fitness, muscular endurance and strength as measured by increased METs and functional capacity ( )       Able to understand and use rate of perceived exertion (RPE)  scale Yes       Intervention Provide education and explanation on how to use RPE scale       Expected Outcomes Short Term: Able to use RPE daily in rehab to express subjective intensity level;Long Term:  Able to use RPE to guide intensity level when exercising independently       Able to understand and use Dyspnea scale Yes       Intervention Provide education and explanation on how to use Dyspnea scale       Expected Outcomes Short Term: Able to use Dyspnea scale daily in rehab to express subjective sense of shortness of breath during exertion;Long Term: Able to use Dyspnea scale to guide intensity level when exercising independently       Knowledge and understanding of Target Heart Rate Range (THRR) Yes       Intervention Provide education and explanation of THRR including how the numbers were predicted and where they are located for reference       Expected Outcomes Short Term: Able to state/look up THRR;Short Term: Able to use daily as guideline for intensity in rehab;Long Term: Able to use THRR to govern intensity when exercising independently       Able to check pulse independently Yes       Intervention Provide education and demonstration on how to check pulse in carotid and radial arteries.;Review the importance of being able to check your own pulse for safety during independent exercise       Expected Outcomes Short Term: Able to explain why pulse checking is important during independent exercise;Long Term: Able to check pulse independently and accurately       Understanding of Exercise Prescription Yes       Intervention Provide education, explanation, and written materials on patient's individual exercise prescription       Expected Outcomes Short Term: Able to explain program exercise prescription;Long Term: Able to explain home exercise prescription to exercise independently          Exercise Goals Re-Evaluation :  Exercise Goals Re-Evaluation     Row Name 11/08/23 1111 11/21/23  1610 12/03/23 1358 12/17/23 1517 12/23/23 1127     Exercise Goal Re-Evaluation   Exercise Goals Review Increase Physical Activity;Able to understand and use rate of perceived exertion (RPE) scale;Knowledge and understanding of Target Heart Rate Range (THRR);Understanding of Exercise Prescription;Increase Strength and Stamina;Able to understand and use Dyspnea scale;Able to check pulse independently Increase Physical Activity;Increase Strength and Stamina;Understanding of Exercise Prescription Increase Physical Activity;Increase Strength and Stamina;Understanding of Exercise Prescription Increase Physical Activity;Increase Strength and Stamina;Understanding of Exercise Prescription Increase Physical Activity;Able to understand and use rate of perceived exertion (RPE) scale;Knowledge and understanding of Target Heart Rate Range (THRR);Understanding of Exercise Prescription;Increase Strength and Stamina;Able to understand and use Dyspnea scale;Able to check pulse independently   Comments Reviewed RPE and dyspnea scale, THR and program prescription with pt today.  Pt voiced understanding and was given a copy of goals to take home. Allen Tapia is off to a good start in the program. He began using the treadmill at a speed of 2.5 mph with no incline. He also did well on the recumbent bike at level 3 and the T6 nustep at level 2. We will continue  to monitor his progress in the program. Allen Tapia is doing well in rehab. He improved to level 5 on both the T6 nustep and the recumbent bike. He also continues to walk on the treadmill at a speed of 2.5 mph with no incline. We will continue to monitor his progress in the program. Allen Tapia returned to rehab on 12/16/2023 for the first time after not attending since 11/22/2023. We will continue to monitor his progress in the program. Reviewed home exercise with pt today.  Pt plans to walk for exercise.  Reviewed THR, pulse, RPE, sign and symptoms, pulse oximetery and when to call 911 or MD.   Also discussed weather considerations and indoor options.  Pt voiced understanding.   Expected Outcomes Short: Use RPE daily to regulate intensity. Long: Follow program prescription in THR. Short: Continue to follow current exercise prescription. Long: Continue exercise to improve strength and stamina. Short: Begin to progressively increase treadmill workload. Long: Continue exercise to improve strength and stamina. Short: Return to regular attendance in rehab. Long: Continue exercise to improve strength and stamina. Short: add 1-2 days a week of exercise at home on off days of rehab. Long: maintain independent exercise routine upon graduation from cardiac rehab.    Row Name 01/02/24 1645 01/16/24 1546 01/27/24 1049 01/27/24 1123 01/30/24 1720     Exercise Goal Re-Evaluation   Exercise Goals Review Increase Physical Activity;Understanding of Exercise Prescription;Increase Strength and Stamina Increase Physical Activity;Understanding of Exercise Prescription;Increase Strength and Stamina Increase Physical Activity;Understanding of Exercise Prescription;Increase Strength and Stamina Increase Physical Activity;Increase Strength and Stamina;Understanding of Exercise Prescription Increase Physical Activity;Increase Strength and Stamina;Understanding of Exercise Prescription   Comments Allen Tapia is doing well in rehab. He increased his workload on the treadmill to a speed of 2.5 mph and incline of 1%. He increased to level 6 on the T5 nustep and level 7 on the T4 nustep. He maintained level 5 on the bike. We will continue to monitor his progress in the program. Allen Tapia continues to do well in rehab. He has recently been able to increase his treadmill workload to a speed of 2. and 0.5% incline. He was also able to increase from level 1 to 7 on the biostep. We will continue to monitor his progress in the program. Allen Tapia continues to do well in rehab. He is due for his post soon and hopes to improve. He was able to  maintain level 7 on the biostep and added the XR at level 1. He increased his speed on the treadmill to 2.8 mph with no incline. He decreased slightly on the recumbent bike to level 4 and down to level 5 on the T4 nustep. We will continue to monitor his progress in the program. Allen Tapia did his post 6 MWT today. He improved by 110 ft. He plans to continue to exercise at [employer] upon graduation from cardaic rehab. Allen Tapia is doing well in rehab. He was recently able to complete his post-6MWT where he improved by 172ft. He was able to maintain his treadmill workload at a speed of 2. and 0% incline. We will continue to monitor his progress in the program.   Expected Outcomes Short: Continue to progressively increase treadmill and nustep workloads. Long: Continue exercise to improve strength and stamina. Short: Continue to increase treadmill workload. Long: Continue exercise to improve strength and stamina. Short: Improve on post . Long: Continue to increase overall METs and stamina. Short: graduate. Long: maintain independent exercise routine upon graduation. Short: Graduate. Long: Continue  exercise to improve strength and stamina.      Discharge Exercise Prescription (Final Exercise Prescription Changes):  Exercise Prescription Changes - 01/30/24 1700       Response to Exercise   Blood Pressure (Admit) 100/58    Blood Pressure (Exit) 100/52    Heart Rate (Admit) 57 bpm    Heart Rate (Exercise) 102 bpm    Heart Rate (Exit) 78 bpm    Rating of Perceived Exertion (Exercise) 13    Symptoms none    Duration Continue with 30 min of aerobic exercise without signs/symptoms of physical distress.    Intensity THRR unchanged      Progression   Progression Continue to progress workloads to maintain intensity without signs/symptoms of physical distress.    Average METs 3.66      Resistance Training   Training Prescription Yes    Weight 5 lb    Reps 10-15      Interval Training   Interval  Training No      Oxygen   Oxygen Continuous      Treadmill   MPH 2.8    Grade 0    Minutes 15    METs 3.14      Recumbant Bike   Level 4    Watts 33    Minutes 15    METs 3.1      NuStep   Level 5    Minutes 15    METs 4.7      T5 Nustep   Level 3    Minutes 15    METs 2.2      Biostep-RELP   Level 7    Minutes 15    METs 4      Oxygen   Maintain Oxygen Saturation 88% or higher          Nutrition:  Target Goals: Understanding of nutrition guidelines, daily intake of sodium 1500mg , cholesterol 200mg , calories 30% from fat and 7% or less from saturated fats, daily to have 5 or more servings of fruits and vegetables.  Education: Nutrition 1 -Group instruction provided by verbal, written material, interactive activities, discussions, models, and posters to present general guidelines for heart healthy nutrition including macronutrients, label reading, and promoting whole foods over processed counterparts. Education serves as pensions consultant of discussion of heart healthy eating for all. Written material provided at class time. Flowsheet Row Cardiac Rehab from 02/05/2024 in [facility] Cardiac and Pulmonary Rehab  Date 01/15/24  Educator jg  Instruction Review Code 1- Verbalizes Understanding     Education: Nutrition 2 -Group instruction provided by verbal, written material, interactive activities, discussions, models, and posters to present general guidelines for heart healthy nutrition including sodium, cholesterol, and saturated fat. Providing guidance of habit forming to improve blood pressure, cholesterol, and body weight. Written material provided at class time. Flowsheet Row Cardiac Rehab from 02/05/2024 in [facility] Cardiac and Pulmonary Rehab  Date 11/13/23  Educator jg  Instruction Review Code 1- Verbalizes Understanding      Biometrics:  Pre Biometrics - 10/30/23 1104       Pre Biometrics   Height 6' 0.6 (1.844 m)    Weight 202 lb 8 oz (91.9 kg)    Waist  Circumference 41 inches    Hip Circumference 41 inches    Waist to Hip Ratio 1 %    BMI (Calculated) 27.01    Single Leg Stand 14.9 seconds          Post Biometrics - 01/27/24 1124  Post  Biometrics   Weight 211 lb 4.8 oz (95.8 kg)    Waist Circumference 43 inches    Hip Circumference 43 inches    Waist to Hip Ratio 1 %    BMI (Calculated) 28.65    Single Leg Stand 2.06 seconds          Nutrition Therapy Plan and Nutrition Goals:   Nutrition Assessments:  MEDIFICTS Score Key: >=70 Need to make dietary changes  40-70 Heart Healthy Diet <= 40 Therapeutic Level Cholesterol Diet  Flowsheet Row Cardiac Rehab from 01/31/2024 in [facility] Cardiac and Pulmonary Rehab  Picture Your Plate Total Score on Admission 53  Picture Your Plate Total Score on Discharge 61   Picture Your Plate Scores: <59 Unhealthy dietary pattern with much room for improvement. 41-50 Dietary pattern unlikely to meet recommendations for good health and room for improvement. 51-60 More healthful dietary pattern, with some room for improvement.  >60 Healthy dietary pattern, although there may be some specific behaviors that could be improved.    Nutrition Goals Re-Evaluation:  Nutrition Goals Re-Evaluation     Row Name 12/16/23 1113 01/15/24 1138 01/27/24 1137         Goals   Comment RD deferred RD deferred RD deferred        Nutrition Goals Discharge (Final Nutrition Goals Re-Evaluation):  Nutrition Goals Re-Evaluation - 01/27/24 1137       Goals   Comment RD deferred          Psychosocial: Target Goals: Acknowledge presence or absence of significant depression and/or stress, maximize coping skills, provide positive support system. Participant is able to verbalize types and ability to use techniques and skills needed for reducing stress and depression.   Education: Stress, Anxiety, and Depression - Group verbal and visual presentation to define topics covered.  Reviews how body is  impacted by stress, anxiety, and depression.  Also discusses healthy ways to reduce stress and to treat/manage anxiety and depression. Written material provided at class time.   Education: Sleep Hygiene -Provides group verbal and written instruction about how sleep can affect your health.  Define sleep hygiene, discuss sleep cycles and impact of sleep habits. Review good sleep hygiene tips.   Initial Review & Psychosocial Screening:  Initial Psych Review & Screening - 10/28/23 1412       Initial Review   Current issues with History of Depression;Current Stress Concerns    Comments Patient stated he has a good support system and currently takes wellbutrin . 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 He stated his wife has been very attentive to his recovery and health. Patient stated that he is now able to drive and that is helping him get back to normal. 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 Overall, patient stated he is doing well and does not have any thoughts or made any attempts to harm himself.      Family Dynamics   Good Support System? Yes    Comments wife is very supportive      Barriers   Psychosocial barriers to participate in program The patient should benefit from training in stress management and relaxation.   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  Quality of Life Scores:   Quality of Life - 01/31/24 0935       Quality of Life Scores   Health/Function Pre 22.83 %    Health/Function Post 19.87 %    Health/Function % Change -12.97 %    Socioeconomic Pre 20.3 %    Socioeconomic Post 27.5 %    Socioeconomic % Change  35.47 %    Psych/Spiritual Pre 24.69 %    Psych/Spiritual Post 21.36 %    Psych/Spiritual % Change -13.49 %    Family Pre 23.57 %     Family Post 21.6 %    Family % Change -8.36 %    GLOBAL Pre 26.4 %    GLOBAL Post 22 %    GLOBAL % Change -16.67 %         Scores of 19 and below usually indicate a poorer quality of life in these areas.  A difference of  2-3 points is a clinically meaningful difference.  A difference of 2-3 points in the total score of the Quality of Life Index has been associated with significant improvement in overall quality of life, self-image, physical symptoms, and general health in studies assessing change in quality of life.  PHQ-9: Review Flowsheet       01/27/2024 01/15/2024 12/16/2023 10/30/2023  Depression screen PHQ 2/9  Decreased Interest 1 1 1 1   Down, Depressed, Hopeless 0 0 0 1  PHQ - 2 Score 1 1 1 2   Altered sleeping 1 1 1 1   Tired, decreased energy 2 2 3 3   Change in appetite 1 1 1 2   Feeling bad or failure about yourself  0 1 0 0  Trouble concentrating 1 1 0 1  Moving slowly or fidgety/restless 1 1 1 1   Suicidal thoughts 0 0 0 0  PHQ-9 Score 7  8  7  10    Difficult doing work/chores Somewhat difficult Not difficult at all Not difficult at all Not difficult at all    Details       Data saved with a previous flowsheet row definition        Interpretation of Total Score  Total Score Depression Severity:  1-4 = Minimal depression, 5-9 = Mild depression, 10-14 = Moderate depression, 15-19 = Moderately severe depression, 20-27 = Severe depression   Psychosocial Evaluation and Intervention:  Psychosocial Evaluation - 10/28/23 1418       Psychosocial Evaluation & Interventions   Interventions Stress management education;Relaxation education;Encouraged to exercise with the program and follow exercise prescription    Comments Patient stated he has a good support system and currently takes wellbutrin . 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 He stated his wife has been very attentive to his recovery and health. Patient stated that he is now able to drive and  that is helping him get back to normal. 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 Overall, patient stated he is doing well and does not have any thoughts or made any attempts to harm himself.    Expected Outcomes ST: Attend cardiac rehab for education and exercise; LT: develop and maintain positive self-care habits    Continue Psychosocial Services  Follow up required by staff          Psychosocial Re-Evaluation:  Psychosocial Re-Evaluation     Row Name 12/16/23 1106 01/15/24 1143 01/27/24 1141  Psychosocial Re-Evaluation   Current issues with Current Sleep Concerns Current Sleep Concerns Current Sleep Concerns     Comments Allen Tapia is doing well and states he has no stress concerns at this time. He is sleeping as well as he can, but has had ongoing sleep concerns his whole life. Allen Tapia's PHQ 9 was re-done. His score went from a 7 to an 8. He does reports that he has seasonal mood changes an takes something for that. He is not sure if he still needs to take it. He stated that he manages his symptoms and has  good support system. He was told by staff that if he needed any mental health resources to reach out to staff and community resources could be provided. Allen Tapia reports that he does continue to take meds for depression and feels like it is stable. His PHQ went from an 8 to a 7. He states he has all the resources he needs to manage this area of his life and will continue to do so upon graduation from cardiac rehab.     Expected Outcomes Short: Continue to work to improve sleep, and limit stressors. Long: Continue to manage stressors of daily life through healthy means. Short: continue to attend cardiac rehab for the mental health benefits of exercise. Long: maintain good mental health routine. Short:: graduate. Long: maintain good mental health.     Interventions Encouraged to attend Cardiac Rehabilitation for the  exercise Encouraged to attend Cardiac Rehabilitation for the exercise Encouraged to attend Cardiac Rehabilitation for the exercise     Continue Psychosocial Services  Follow up required by staff No Follow up required --        Psychosocial Discharge (Final Psychosocial Re-Evaluation):  Psychosocial Re-Evaluation - 01/27/24 1141       Psychosocial Re-Evaluation   Current issues with Current Sleep Concerns    Comments Allen Tapia reports that he does continue to take meds for depression and feels like it is stable. His PHQ went from an 8 to a 7. He states he has all the resources he needs to manage this area of his life and will continue to do so upon graduation from cardiac rehab.    Expected Outcomes Short:: graduate. Long: maintain good mental health.    Interventions Encouraged to attend Cardiac Rehabilitation for the exercise          Vocational Rehabilitation: Provide vocational rehab assistance to qualifying candidates.   Vocational Rehab Evaluation & Intervention:  Vocational Rehab - 10/28/23 1412       Initial Vocational Rehab Evaluation & Intervention   Assessment shows need for Vocational Rehabilitation No          Education: Education Goals: Education classes will be provided on a variety of topics geared toward better understanding of heart health and risk factor modification. Participant will state understanding/return demonstration of topics presented as noted by education test scores.  Learning Barriers/Preferences:  Learning Barriers/Preferences - 10/28/23 1411       Learning Barriers/Preferences   Learning Barriers None    Learning Preferences Individual Instruction;Group Instruction;Verbal Instruction;Video;Written Material;Computer/Internet;Audio          General Cardiac Education Topics:  AED/CPR: - Group verbal and written instruction with the use of models to demonstrate the basic use of the AED with the basic ABC's of resuscitation.   Test and  Procedures: - Group verbal and visual presentation and models provide information about basic cardiac anatomy and function. Reviews the testing methods done to diagnose heart disease and  the outcomes of the test results. Describes the treatment choices: Medical Management, Angioplasty, or Coronary Bypass Surgery for treating various heart conditions including Myocardial Infarction, Angina, Valve Disease, and Cardiac Arrhythmias. Written material provided at class time. Flowsheet Row Cardiac Rehab from 02/05/2024 in [facility] Cardiac and Pulmonary Rehab  Date 11/20/23  Educator KB  Instruction Review Code 1- Verbalizes Understanding    Medication Safety: - Group verbal and visual instruction to review commonly prescribed medications for heart and lung disease. Reviews the medication, class of the drug, and side effects. Includes the steps to properly store meds and maintain the prescription regimen. Written material provided at class time. Flowsheet Row Cardiac Rehab from 02/05/2024 in [facility] Cardiac and Pulmonary Rehab  Date 02/05/24  Educator KB  Instruction Review Code 1- Verbalizes Understanding    Intimacy: - Group verbal instruction through game format to discuss how heart and lung disease can affect sexual intimacy. Written material provided at class time.   Know Your Numbers and Heart Failure: - Group verbal and visual instruction to discuss disease risk factors for cardiac and pulmonary disease and treatment options.  Reviews associated critical values for Overweight/Obesity, Hypertension, Cholesterol, and Diabetes.  Discusses basics of heart failure: signs/symptoms and treatments.  Introduces Heart Failure Zone chart for action plan for heart failure. Written material provided at class time.   Infection Prevention: - Provides verbal and written material to individual with discussion of infection control including proper hand washing and proper equipment cleaning during exercise  session. Flowsheet Row Cardiac Rehab from 02/05/2024 in [facility] Cardiac and Pulmonary Rehab  Date 10/30/23  Educator [facility]  Instruction Review Code 1- Verbalizes Understanding    Falls Prevention: - Provides verbal and written material to individual with discussion of falls prevention and safety. Flowsheet Row Cardiac Rehab from 02/05/2024 in [facility] Cardiac and Pulmonary Rehab  Date 10/30/23  Educator [facility]  Instruction Review Code 1- Verbalizes Understanding    Other: -Provides group and verbal instruction on various topics (see comments)   Knowledge Questionnaire Score:  Knowledge Questionnaire Score - 01/31/24 0935       Knowledge Questionnaire Score   Pre Score 25/26    Post Score 26/26          Core Components/Risk Factors/Patient Goals at Admission:  Personal Goals and Risk Factors at Admission - 10/28/23 1410       Core Components/Risk Factors/Patient Goals on Admission    Weight Management Weight Loss   current weight 196, would like to weigh 180; Also interested in reducing dose of statin as 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    Expected Outcomes Short Term: Continued assessment and intervention until BP is < 140/66mm HG in hypertensive participants. < 130/3mm HG in hypertensive participants with diabetes, heart failure or chronic kidney disease.;Long Term: Maintenance of blood pressure at goal levels.    Lipids Yes    Intervention Provide education and support for participant on nutrition & aerobic/resistive exercise along with prescribed medications to achieve LDL 70mg , HDL >40mg .    Expected Outcomes Short Term: Participant states understanding of desired cholesterol values  and is compliant with medications prescribed. Participant is following exercise prescription and nutrition guidelines.;Long Term: Cholesterol controlled with medications as prescribed, with individualized exercise RX and with personalized nutrition  plan. Value goals: LDL < 70mg , HDL > 40 mg.    Stress Yes    Intervention Offer individual and/or small group education and counseling on adjustment to heart disease, stress management and health-related lifestyle change. Teach and support self-help strategies.;Refer participants experiencing significant psychosocial distress to appropriate mental health specialists for further evaluation and treatment. When possible, include family members and significant others in education/counseling sessions.    Expected Outcomes Short Term: Participant demonstrates changes in health-related behavior, relaxation and other stress management skills, ability to obtain effective social support, and compliance with psychotropic medications if prescribed.;Long Term: Emotional wellbeing is indicated by absence of clinically significant psychosocial distress or social isolation.          Education:Diabetes - Individual verbal and written instruction to review signs/symptoms of diabetes, desired ranges of glucose level fasting, after meals and with exercise. Acknowledge that pre and post exercise glucose checks will be done for 3 sessions at entry of program.   Core Components/Risk Factors/Patient Goals Review:   Goals and Risk Factor Review     Row Name 12/16/23 1115 01/15/24 1138 01/27/24 1135         Core Components/Risk Factors/Patient Goals Review   Personal Goals Review Hypertension;Weight Management/Obesity Hypertension;Weight Management/Obesity Hypertension     Review Allen Tapia states that he has not been taking his BP as frequently as of late. He was taking it at least once a day for the first couple of weeks after his surgery but has reduced his frequency to only  when he feels off. He also states that he is trying to ultimately lose about 20lbs, but has just recently recovered from a sickness and is excited to start exercising again to work towards his weight goals. Allen Tapia was encouraged to start getting in the habit of taking his BP at home a few days a week. He is not currently doing this. He states his weight has not decreased yet. Allen Tapia is getting ready to graduate from cardiac rehab. He states he occationally checks his BP at home. He was encouarged to do so consistently upon graduation.     Expected Outcomes Short: Lose 5lbs, take BP at home more frequently. Long: Lose 20lbs. Short: Lose 5lbs, state taking BP at home. Long: control cardaic risk factors. Short: graduate Long: control cardiac risk factors and continue to monitor BP at home.        Core Components/Risk Factors/Patient Goals at Discharge (Final Review):   Goals and Risk Factor Review - 01/27/24 1135       Core Components/Risk Factors/Patient Goals Review   Personal Goals Review Hypertension    Review Allen Tapia is getting ready to graduate from cardiac rehab. He states he occationally checks his BP at home. He was encouarged to do so consistently upon graduation.    Expected Outcomes Short: graduate Long: control cardiac risk factors and continue to monitor BP at home.          ITP Comments:  ITP Comments     Row Name 10/28/23 1424 10/30/23 1056 11/08/23 1110 11/20/23 1045 12/18/23 1119   ITP Comments Initial phone call completed. Diagnosis can be found in [facility] 09/16/2023. EP Orientation scheduled for Wednesday, October 30, 2023, @ 0900. Completed and gym orientation for cardiac rehab. Initial ITP created and sent for review to Dr. Oneil Pinal, Medical Director. First full day of exercise!  Patient was oriented to gym and equipment including functions, settings, policies, and procedures.  Patient's individual exercise prescription and treatment plan were  reviewed.  All starting workloads  were established based on the results of the 6 minute walk test done at initial orientation visit.  The plan for exercise progression was also introduced and progression will be customized based on patient's performance and goals. Virtual Visit completed. Patient informed on EP and RD appointment and 6 Minute walk test. Patient also informed of patient health questionnaires on My Chart. Patient Verbalizes understanding. For visist on 08/28/2023. New to program. 30 Day review completed. Medical Director ITP review done; changes made as directed and signed approval by Medical Director. New to program.    Row Name 01/15/24 1056 02/12/24 1003 02/12/24 1203       ITP Comments 30 Day review completed. Medical Director ITP review done, changes made as directed, and signed approval by Medical Director. 30 Day review completed. Medical Director ITP review done, changes made as directed, and signed approval by Medical Director. Allen Tapia graduated today from  rehab with 36 sessions completed.  Details of the patient's exercise prescription and what He needs to do in order to continue the prescription and progress were discussed with patient.  Patient was given a copy of prescription and goals.  Patient verbalized understanding. Allen Tapia plans to continue to exercise by going to [employer].        Comments: Discharge ITP

## 2024-02-12 NOTE — Progress Notes (Signed)
 Daily Session Note  Patient Details  Name: Allen Tapia MRN: [id number] Date of Birth: Nov 13, 1955 Referring Provider:   Flowsheet Row Cardiac Rehab from 10/30/2023 in [facility] Cardiac and Pulmonary Rehab  Referring Provider Dr. Shelda Bruckner    Encounter Date: 02/12/2024  Check In:  Session Check In - 02/12/24 1200       Check-In   Supervising physician immediately available to respond to emergencies See telemetry face sheet for immediately available ER MD    Location [facility]-Cardiac & Pulmonary Rehab    Staff Present Rollene Paterson, MS, Exercise Physiologist;Noah Tickle, BS, Exercise Physiologist;Laura Cates RN,BSN;Delane Stalling, RN, DNP, NE-BC;Kelly Bollinger RN,BSN,MPA    Virtual Visit No    Medication changes reported     No    Fall or balance concerns reported    No    Warm-up and Cool-down Performed on first and last piece of equipment    Resistance Training Performed Yes    VAD Patient? No    PAD/SET Patient? No      Pain Assessment   Currently in Pain? No/denies             Social History   Tobacco Use  Smoking Status Former   Current packs/day: 0.00   Average packs/day: 1 pack/day for 32.5 years (32.5 ttl pk-yrs)   Types: Cigarettes   Start date: 64   Quit date: 09/23/2006   Years since quitting: 17.4  Smokeless Tobacco Never    Goals Met:  Independence with exercise equipment Exercise tolerated well No report of concerns or symptoms today Strength training completed today  Goals Unmet:  Not Applicable  Comments: [name] graduated today from  rehab with 36 sessions completed.  Details of the patient's exercise prescription and what He needs to do in order to continue the prescription and progress were discussed with patient.  Patient was given a copy of prescription and goals.  Patient verbalized understanding. [name] plans to continue to exercise by going to [employer].    Dr. Oneil Pinal is Medical Director for [facility] Cardiac  Rehabilitation.  Dr. Fuad Aleskerov is Medical Director for [facility] Pulmonary Rehabilitation.

## 2024-02-12 NOTE — Progress Notes (Signed)
 Discharge summary Pt Allen Tapia DOB 09/22/1955  [name] graduated today from  rehab with 36 sessions completed.  Details of the patient's exercise prescription and what He needs to do in order to continue the prescription and progress were discussed with patient.  Patient was given a copy of prescription and goals.  Patient verbalized understanding. [name] plans to continue to exercise by going to [employer].   6 Minute Walk     Row Name 10/30/23 1057 01/27/24 1121       6 Minute Walk   Phase Initial Discharge    Distance 1395 feet 1505 feet    Distance % Change -- 8 %    Distance Feet Change -- 110 ft    Walk Time 6 minutes 6 minutes    # of Rest Breaks 0 0    MPH 2.64 2.85    METS 3.48 3.4    RPE 11 13    Perceived Dyspnea  1 1    VO2 Peak 12.2 11.9    Symptoms No No    Resting HR 59 bpm 65 bpm    Resting BP 124/62 122/82    Resting Oxygen Saturation  93 % 93 %    Exercise Oxygen Saturation  during 6 min walk 97 % 91 %    Max Ex. HR 115 bpm 105 bpm    Max Ex. BP 136/62 124/72    2 Minute Post BP 128/64 100/62

## 2024-02-14 ENCOUNTER — Encounter

## 2024-02-17 ENCOUNTER — Encounter

## 2024-02-17 ENCOUNTER — Other Ambulatory Visit: Payer: Self-pay | Admitting: Urology

## 2024-02-17 DIAGNOSIS — N3281 Overactive bladder: Secondary | ICD-10-CM

## 2024-02-27 NOTE — Progress Notes (Signed)
 / Cardiology Office Note:  .   Date:  03/04/2024  ID:  Allen Tapia, DOB 26-Oct-1955, MRN [id number] PCP: Allen Lame, MD  [facility] Providers Cardiologist:  Allen Bruckner, MD Cardiology APP:  Allen Tapia HERO, NP    Patient Profile: .      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  Other vessels with nonobstructive disease.  He established with Allen Tapia 05/04/2019 at which time he reported taking Gilemya for multiple sclerosis for a number of years.  Initially had heart pounding but now tolerates.  He reported feeling like he was moving slower than in the past, losing strength/fine motor skills.  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  He had been retired for about 7 years at that time.  Seen in clinic 05/20/2023 by Allen Finder, NP. Having chest pain and elevated BP at home 135-150/80-90. Pain at rest in his chair that occurs also when up and moving around at times. Pain midsternal to left side with occasional radiation down his left arm. Infrequent palpitations when resting.  Has chronic musculoskeletal pain secondary to MS but notes present pain feels different than previous musculoskeletal pain. Frustrated by immobility and weight gain, but ultimately attributes this to the  season.  Family history of father and brother having CABG in the past. Amlodipine  5 mg daily was added. EKG was unremarkable but due to chest pain, cardiac PET was ordered. LDL was at goal.   Cardiac PET 06/06/23 revealed small area of apical ischemia but otherwise low risk with normal heart function. He was advised to start Imdur  30 mg daily.   Seen in clinic by me on 07/18/23 reported continued chest discomfort particularly in two locations, mid sternal and left breast. Pain most frequently at rest, but admittedly less active since starting Imdur . Pain improved with Imdur  but he is having some concerning side effects. Notes a constant dull headache and pain in the back of his neck. Also feels more fatigued since starting it. Symptoms have not significantly inhibited his activities. BP readings at home have been elevated. He brought his machine for calibration and his machine is reading higher than ours. No shortness of breath, palpitations, orthopnea, PND, edema, presyncope or syncope. He is very concerned about his chest pain due to the fact that both his brother and father had CABG and did not have significant symptoms prior. He also had LAD stenosis on cath in 2009 which he believes was in the 70% range.  His brother was hospitalized for 67 days due to complications post CABG.  He would like to undergo cardiac catheterization for further evaluation. Discussed plan with primary cardiologist, Allen Tapia, who agreed with diagnostic cath.  LHC completed 07/26/23 revealed mid LAD 90% stenosis, first diagonal 70% stenosis, second diagonal 70% stenosis, and proximal RCA lesion 20% stenosis.  Given the complex disease  and calcified mid LAD involving the diagonal branch, recommendation for CABG.  He underwent CABG x 2 (LIMA-LAD, RVSG-diagonal) with Allen Tapia on 09/16/2023 with postop A-fib started on amiodarone  and Eliquis  for stroke prevention.  He was discharged 09/21/2023.  He was seen by CT surgery on  09/23/2023 for redness and thigh wound, not overly concerning.  BP was soft but he was scheduled to take his last dose of Lasix .  Seen via telehealth by Allen Tapia on 09/24/2023 with no major concerns.  Seen in follow-up by me on 10/07/23, feeling fatigued and unmotivated. No symptoms of angina. Metoprolol  was decreased to 12.5 mg twice daily.   Seen by Allen Tapia on 01/13/24, in cardiac rehab and feeling lower body is weak.  He wanted to come off amiodarone , apixaban , and metoprolol .  He was having bruising on apixaban  but no severe bleeding.  Lengthy discussion about stroke risk and monitoring of A-fib.  He was advised to stop amiodarone  at the time of the visit and if no A-fib symptoms in a week, taper off metoprolol .  He also noted quality of life much better when taking Adderall.  He was counseled on Adderall and NSAIDs with CAD and after shared decision making, decided he would continue Adderall closely monitoring heart rate and blood pressure.  LDL was at goal of 42 and although he wanted to consider reducing rosuvastatin  to 20 mg daily, 40 mg dose was continued.       History of Present Illness: .    History of Present Illness Allen Tapia is a 68 year old male who is here today for follow-up of CAD. He wants to review his medications. He reports no recent atrial fibrillation episodes or recurrence of symptoms similar to those during his hospitalization. He has an ascending aortic aneurysm and a repaired abdominal aortic aneurysm. He is due for CT of the thoracic aorta in March, with the last CT done the prior March. He has regular abdominal aorta ultrasound surveillance, last performed in January 2025. Reports generally feeling well with regular physical activity. He completed cardiac rehab. Has been a little less active since weather has been cold. He denies dyspnea, chest pain or other symptoms concerning for angina. He had pneumonia twice in 2024 and a possible COVID-19 infection during  cardiac rehab, which limited participation. Currently has a tremor, worse in the morning and later in the day, and is worried about Parkinson's disease. He wonders if the tremor is related to medications, including Wellbutrin  and possibly Breztri . He has taken trazodone  long term. BP is stable without antihypertensives, typically at or below 130 systolic. He denies palpitations, orthopnea, PND, edema, presyncope or syncope.   Discussed the use of AI scribe software for clinical note transcription with the patient, who gave verbal consent to proceed.   ROS: See HPI       Studies Reviewed: SABRA         Lipoprotein (a)  Date/Time Value Ref Range Status  10/24/2022 10:22 AM 64.0 <75.0 nmol/L Final    Comment:    Note:  Values greater than or equal to 75.0 nmol/L may        indicate an independent risk factor for CHD,        but must be evaluated with caution when applied        to non-Caucasian populations due to the        influence of genetic factors on Lp(a) across        ethnicities.  Risk Assessment/Calculations:             Physical Exam:   VS:  BP (!) 118/58 (BP Location: Left Arm, Patient Position: Sitting, Cuff Size: Normal)   Pulse 71   Ht 6' (1.829 m)   Wt 209 lb (94.8 kg)   SpO2 93%   BMI 28.35 kg/m    Wt Readings from Last 3 Encounters:  03/04/24 209 lb (94.8 kg)  01/27/24 211 lb 4.8 oz (95.8 kg)  01/13/24 207 lb 1.6 oz (93.9 kg)    GEN: Well nourished, well developed in no acute distress NECK: No JVD; No carotid bruits CARDIAC: RRR, no murmurs, rubs, gallops RESPIRATORY:  Clear to auscultation without rales, wheezing or rhonchi  ABDOMEN: Soft, non-tender, non-distended EXTREMITIES:  No edema; No deformity     ASSESSMENT AND PLAN: .    Assessment & Plan Coronary artery disease with stable angina  S/p CABG S/p CABG x 2 on 09/16/23 (LIMA-LAD, RSVG-diagonal), minimal residual disease in RCA.  He denies chest pain, dyspnea, or other symptoms concerning for  angina. BP and HR are well controlled. No bleeding concerns.  He completed cardiac rehab without complication and has remained active. He denies chest pain, dyspnea, or other symptoms concerning for angina.  No indication for further ischemic evaluation at this time. Metoprolol  was discontinued in the setting of soft BP and fatigue. He is feeling well.  - Continue rosuvastatin , aspirin   Postop atrial fibrillation No recurrence of atrial fibrillation since hospital discharge to his awareness.  He stopped amiodarone  and a few days later stopped metoprolol  after the 01/13/2024 office visit with Allen Tapia.  He would like to discontinue Eliquis .  We reviewed stroke risk with a CHA2DS2-VASc score of at least 2 that would be associated with return of A-fib. He is monitoring at home with [facility] monitor and will resume Eliquis  if A-fib returns. - Continue routine monitoring - Continue aspirin   Thoracic Aortic dilatation   Mild aortic dilatation is noted on CT 06/08/23, measuring 4.2 cm. He is asymptomatic. BP is well controlled.  - We will get CTA aorta in March 2026 to monitor aortic dilatation  History of AAA History of EVAR to saccular aneurysm of infrarenal abdominal aorta 02/2021. No acute concerns today.  - Management per [facility] - Schedule office visit due 03/2024  Tremor   He notes increased tremor recently.  He is concerned about Parkinson's disease.  Advised intermittent tremor possibly due to medication side effects. Was previously on metoprolol , but discontinued due to fatigue and soft BP.  - Propranolol 10 mg up to 3 times per day as needed for tremor  Chronic cough Asthma History of pneumonia x 2 in 2024 and chronic cough. Bilateral basilar scarring noted on CT 05/2023.  Seen by pulmonology and started on Breztri . No acute concerns today. Advised potential side effect of tremor with Breztri . He is starting propranolol as needed for tremor.  - Management per pulmonology  Hyperlipidemia  LDL goal < 55 Lipid panel completed 12/31/2023 with total cholesterol 108, triglycerides 67, LDL-C 42, and HDL 52.  Lipids are well-controlled.  He is tolerating rosuvastatin  without concerning side effects. - Continue rosuvastatin         Disposition: 6 months with Allen Tapia or APP  Signed, Tapia Bane, NP-C

## 2024-03-04 ENCOUNTER — Encounter (HOSPITAL_BASED_OUTPATIENT_CLINIC_OR_DEPARTMENT_OTHER): Payer: Self-pay | Admitting: Nurse Practitioner

## 2024-03-04 ENCOUNTER — Ambulatory Visit (INDEPENDENT_AMBULATORY_CARE_PROVIDER_SITE_OTHER): Admitting: Nurse Practitioner

## 2024-03-04 VITALS — BP 118/58 | HR 71 | Ht 72.0 in | Wt 209.0 lb

## 2024-03-04 DIAGNOSIS — I4891 Unspecified atrial fibrillation: Secondary | ICD-10-CM

## 2024-03-04 DIAGNOSIS — R251 Tremor, unspecified: Secondary | ICD-10-CM | POA: Diagnosis not present

## 2024-03-04 DIAGNOSIS — J4520 Mild intermittent asthma, uncomplicated: Secondary | ICD-10-CM

## 2024-03-04 DIAGNOSIS — I77819 Aortic ectasia, unspecified site: Secondary | ICD-10-CM

## 2024-03-04 DIAGNOSIS — I9789 Other postprocedural complications and disorders of the circulatory system, not elsewhere classified: Secondary | ICD-10-CM | POA: Diagnosis not present

## 2024-03-04 DIAGNOSIS — I2510 Atherosclerotic heart disease of native coronary artery without angina pectoris: Secondary | ICD-10-CM | POA: Diagnosis not present

## 2024-03-04 DIAGNOSIS — Z9889 Other specified postprocedural states: Secondary | ICD-10-CM | POA: Diagnosis not present

## 2024-03-04 DIAGNOSIS — Z951 Presence of aortocoronary bypass graft: Secondary | ICD-10-CM | POA: Diagnosis not present

## 2024-03-04 MED ORDER — PROPRANOLOL HCL 10 MG PO TABS: 10.0000 mg | ORAL_TABLET | Freq: Three times a day (TID) | ORAL | 3 refills | Status: DC | PRN

## 2024-03-04 NOTE — Patient Instructions (Signed)
 Medication Instructions:    DISCONTINUE Eliquis .  START Propranolol 1 tablet (10 mg total) by mouth 3 (three) times daily as needed (for tremors). - Oral *If you need a refill on your cardiac medications before your next appointment, please call your pharmacy*  Lab Work:  Please get a lipid panel drawn at your PCP, fasting after midnight.   If you have labs (blood work) drawn today and your tests are completely normal, you will receive your results only by: MyChart Message (if you have MyChart) OR A paper copy in the mail If you have any lab test that is abnormal or we need to change your treatment, we will call you to review the results.  Testing/Procedures:  Non-Cardiac CT Angiography (CTA), is a special type of CT scan that uses a computer to produce multi-dimensional views of major blood vessels throughout the body. In CT angiography, a contrast material is injected through an IV to help visualize the blood vessels   Follow-Up: At [facility], you and your health needs are our priority.  As part of our continuing mission to provide you with exceptional heart care, our providers are all part of one team.  This team includes your primary Cardiologist (physician) and Advanced Practice Providers or APPs (Physician Assistants and Nurse Practitioners) who all work together to provide you with the care you need, when you need it.  Your next appointment:   6 month(s)  Provider:   Shelda Bruckner, MD, Rosaline Bane, NP, or [name], NP    We recommend signing up for the patient portal called MyChart.  Sign up information is provided on this After Visit Summary.  MyChart is used to connect with patients for Virtual Visits (Telemedicine).  Patients are able to view lab/test results, encounter notes, upcoming appointments, etc.  Non-urgent messages can be sent to your provider as well.   To learn more about what you can do with MyChart, go to  [email or web address].   Other Instructions  Your physician wants you to follow-up in: 6 months.  You will receive a reminder letter in the mail two months in advance. If you don't receive a letter, please call our office to schedule the follow-up appointment.  Adopting a Healthy Lifestyle.   Weight: Know what a healthy weight is for you (roughly BMI <25) and aim to maintain this. You can calculate your body mass index on your smart phone. Unfortunately, this is not the most accurate measure of healthy weight, but it is the simplest measurement to use. A more accurate measurement involves body scanning which measures lean muscle, fat tissue and bony density. We do not have this equipment at [facility].    Diet: Aim for 7+ servings of fruits and vegetables daily Limit animal fats in diet for cholesterol and heart health - choose grass fed whenever available Avoid highly processed foods (fast food burgers, tacos, fried chicken, pizza, hot dogs, french fries)  Saturated fat comes in the form of butter, lard, coconut oil, margarine, partially hydrogenated oils, dairy products, and fat in meat. These increase your risk of cardiovascular disease.  Use healthy plant oils, such as olive, canola, soy, corn, sunflower and peanut.  Whole foods such as fruits, vegetables and whole grains have fiber  Men need > 38 grams of fiber per day Women need > 25 grams of fiber per day  Load up on vegetables and fruits - one-half of your plate: Aim for color and variety, and remember that potatoes dont count. Go for  whole grains - one-quarter of your plate: Whole wheat, barley, wheat berries, quinoa, oats, brown rice, and foods made with them. If you want pasta, go with whole wheat pasta. Protein power - one-quarter of your plate: Fish, chicken, beans, and nuts are all healthy, versatile protein sources. Limit red meat. You need carbohydrates for energy! The type of carbohydrate is more important than the amount.  Choose carbohydrates such as vegetables, fruits, whole grains, beans, and nuts in the place of white rice, white pasta, potatoes (baked or fried), macaroni and cheese, cakes, cookies, and donuts.  If youre thirsty, drink water. Coffee and tea are good in moderation, but skip sugary drinks and limit milk and dairy products to one or two daily servings. Keep sugar intake at 6 teaspoons or 24 grams or LESS       Exercise: Aim for 150 min of moderate intensity exercise weekly for heart health, and weights twice weekly for bone health Stay active - any steps are better than no steps! Aim for 7-9 hours of sleep daily   Sleep: This provides your body with the reset and relaxation that it needs!  Aim to get 7-8 hours of sleep each night. Limit caffeine, screen time, and other distractions prior to bedtime.  Keep your bedroom cool and dark and do not wear heavy clothing to bed or use heavy bed covers - layer if needed.

## 2024-03-05 ENCOUNTER — Encounter (HOSPITAL_BASED_OUTPATIENT_CLINIC_OR_DEPARTMENT_OTHER): Payer: Self-pay

## 2024-03-05 ENCOUNTER — Other Ambulatory Visit (HOSPITAL_BASED_OUTPATIENT_CLINIC_OR_DEPARTMENT_OTHER): Payer: Self-pay

## 2024-03-05 MED ORDER — PROPRANOLOL HCL 10 MG PO TABS
10.0000 mg | ORAL_TABLET | Freq: Three times a day (TID) | ORAL | 1 refills | Status: DC | PRN
  Filled 2024-03-05: qty 90, 30d supply, fill #0

## 2024-03-05 MED ORDER — PROPRANOLOL HCL 10 MG PO TABS: 10.0000 mg | ORAL_TABLET | Freq: Three times a day (TID) | ORAL | 1 refills | Status: AC | PRN

## 2024-04-03 ENCOUNTER — Other Ambulatory Visit: Payer: Self-pay

## 2024-04-03 DIAGNOSIS — Z9889 Other specified postprocedural states: Secondary | ICD-10-CM

## 2024-04-09 ENCOUNTER — Ambulatory Visit (HOSPITAL_COMMUNITY)
Admission: RE | Admit: 2024-04-09 | Discharge: 2024-04-09 | Disposition: A | Discharge status: Home / Self Care | Source: Ambulatory Visit | Attending: Vascular Surgery | Admitting: Vascular Surgery

## 2024-04-09 DIAGNOSIS — Z9889 Other specified postprocedural states: Secondary | ICD-10-CM | POA: Insufficient documentation

## 2024-04-09 MED ORDER — IOHEXOL 350 MG/ML SOLN
80.0000 mL | Freq: Once | INTRAVENOUS | Status: AC | PRN
  Administered 2024-04-09: 80 mL via INTRAVENOUS

## 2024-04-15 ENCOUNTER — Ambulatory Visit: Admitting: Urology

## 2024-04-15 VITALS — BP 115/69 | HR 80 | Ht 72.0 in | Wt 201.0 lb

## 2024-04-15 DIAGNOSIS — N401 Enlarged prostate with lower urinary tract symptoms: Secondary | ICD-10-CM | POA: Diagnosis not present

## 2024-04-15 DIAGNOSIS — N138 Other obstructive and reflux uropathy: Secondary | ICD-10-CM

## 2024-04-15 DIAGNOSIS — N3281 Overactive bladder: Secondary | ICD-10-CM

## 2024-04-15 DIAGNOSIS — Z125 Encounter for screening for malignant neoplasm of prostate: Secondary | ICD-10-CM | POA: Diagnosis not present

## 2024-04-15 DIAGNOSIS — N200 Calculus of kidney: Secondary | ICD-10-CM | POA: Diagnosis not present

## 2024-04-15 LAB — BLADDER SCAN AMB NON-IMAGING

## 2024-04-15 MED ORDER — TAMSULOSIN HCL 0.4 MG PO CAPS: 0.4000 mg | ORAL_CAPSULE | Freq: Every day | ORAL | 3 refills | Status: AC

## 2024-04-15 MED ORDER — GEMTESA 75 MG PO TABS: 1.0000 | ORAL_TABLET | Freq: Every day | ORAL | 3 refills | Status: AC

## 2024-04-15 NOTE — Progress Notes (Signed)
" ° °  04/15/2024 9:48 AM   Allen Tapia [birth year] [id number]  Reason for visit: Follow up OAB, BPH status post UroLift, nephrolithiasis, PSA screening  HPI: 69 year old male with long-term well-controlled multiple sclerosis as well as a long history of urinary symptoms.  He has a history of urodynamics showing an obstructed flow pattern with some unstable contractions, and mildly elevated PVRs ranging around 200 mL in the past.  His prostate measured 33 g with lateral lobe hypertrophy. He previously had had some mild improvement on Flomax . After extensive counseling, he opted to undergo a UroLift, and this was performed on 05/20/2020.     Overall, he feels his urination has improved significantly since surgery.  PVRs have really been normal since surgery.  Bladder scan today , but last void was a few hours ago and he denies any urge to urinate at this time.  When we had originally met he was voiding 25-30 times per day, now after UroLift is down to <10 times per day.  Has rare urge incontinence.  He continues to take both Flomax  and Gemtesa , as he feels these both improve his urinary symptoms.   He also has a history of an incidental 1 cm nonobstructing right lower pole stone seen on CT January 2023 and opted for surveillance alone.  I personally viewed and interpreted the recent CT 04/09/2024 showing a stable right lower pole stone, decompressed bladder, no hydronephrosis  PSA has been normal, most recently 0.37 from October 2025.  Unlikely to need further screening past age 16 per the guideline recommendations.  -Flomax  refilled -Gemtesa  refilled -RTC 1 year PVR, will get KUB every other year(next 2028)  Allen JAYSON Burnet, MD  [facility] [address] [place], [place] [address] [phone]  "

## 2024-04-16 ENCOUNTER — Ambulatory Visit: Payer: Self-pay | Admitting: Urology

## 2024-04-17 ENCOUNTER — Encounter (HOSPITAL_BASED_OUTPATIENT_CLINIC_OR_DEPARTMENT_OTHER): Payer: Self-pay

## 2024-04-24 ENCOUNTER — Other Ambulatory Visit: Payer: Self-pay | Admitting: Pulmonary Disease

## 2024-04-28 ENCOUNTER — Ambulatory Visit: Admitting: Pulmonary Disease

## 2024-04-28 ENCOUNTER — Encounter: Payer: Self-pay | Admitting: Pulmonary Disease

## 2024-04-28 VITALS — BP 126/78 | HR 55 | Temp 97.5°F | Ht 72.0 in | Wt 208.0 lb

## 2024-04-28 DIAGNOSIS — R251 Tremor, unspecified: Secondary | ICD-10-CM

## 2024-04-28 DIAGNOSIS — R053 Chronic cough: Secondary | ICD-10-CM

## 2024-04-28 DIAGNOSIS — Z87891 Personal history of nicotine dependence: Secondary | ICD-10-CM

## 2024-04-28 DIAGNOSIS — Z8701 Personal history of pneumonia (recurrent): Secondary | ICD-10-CM

## 2024-04-28 DIAGNOSIS — J4520 Mild intermittent asthma, uncomplicated: Secondary | ICD-10-CM

## 2024-04-28 MED ORDER — PREDNISONE 20 MG PO TABS: ORAL_TABLET | ORAL | 0 refills | Status: AC

## 2024-04-28 MED ORDER — MONTELUKAST SODIUM 10 MG PO TABS: 10.0000 mg | ORAL_TABLET | Freq: Every day | ORAL | 4 refills | Status: AC

## 2024-04-28 MED ORDER — DOXYCYCLINE HYCLATE 100 MG PO TABS: 100.0000 mg | ORAL_TABLET | Freq: Two times a day (BID) | ORAL | 0 refills | Status: AC

## 2024-04-29 ENCOUNTER — Ambulatory Visit: Admitting: Vascular Surgery

## 2024-04-29 ENCOUNTER — Encounter: Payer: Self-pay | Admitting: Vascular Surgery

## 2024-04-29 VITALS — BP 115/76 | HR 67 | Temp 97.8°F | Ht 72.0 in | Wt 205.0 lb

## 2024-04-29 DIAGNOSIS — Z9889 Other specified postprocedural states: Secondary | ICD-10-CM

## 2024-04-29 NOTE — Progress Notes (Signed)
 "  Patient ID: Allen Tapia, male   DOB: Feb 06, 1956, 69 y.o.   MRN: [id number]  Reason for Consult: No chief complaint on file.   Referred by Diedra Lame, MD  Subjective:     HPI:  Allen Tapia is a 69 y.o. male with history of endovascular repair of saccular aortic  aneurysm.  He also has a known small splenic artery aneurysm.  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  He has undergone CABG last July and recovered well.  No new abdominal or back complaints.  He stays very busy working on a house he has planned for his son to live in the future.  Past Medical History:  Diagnosis Date   Anginal pain    atypical chest pain cardiac cath performed at that time   Arthritis    Asthma    Atypical chest pain    Barrett's esophagus    Bilateral cataracts    BPH (benign prostatic hyperplasia)    Cancer (HCC)    skin ca in face and hand   Chicken pox    Chronic constipation    no current problem per pt 09/12/23   COPD (chronic obstructive pulmonary disease) (HCC)    Coronary artery disease 07/14/2007   a.) LHC 07/14/2007: 40% pRCA-1, 40% pRCA-2, 40% dRCA, 20% RPL1, 30% LM, 20% mLCx-1, 40% mLCx-2, 20% OM3-1, 20% OM3-2, 20% pLAD, 40% mLAD-1, 70% mLAD-2, 40% mLAD-3, 30% dLAD, 40% D1, 40% D2 --> med mgmt.   Depression    Foreign body (FB) in soft tissue    Former tobacco use    GERD (gastroesophageal reflux disease)    History of 2019 novel coronavirus disease (COVID-19) 04/07/2019   a.) tested (+) at [facility] 04/16/2019, 05/30/2021; b.) 2 other (+) tests at home (dates unknown)   History of kidney stones    Hyperlipidemia    Hyperplastic colon polyp    Hypertension    Infrarenal abdominal aortic aneurysm (AAA) without rupture    a.) MR lumbar spine 12/30/2020: infrarenal saccular AAA measuring 2.2 cm; b.) CTA A/P 01/05/2021: measured 2.6 cm; c.) s/p EVAR 03/03/2021   Ischemic heart disease 12/20/2010   Long term current use of immunosuppressive drug     a.) on ofatumumab for MS   MS (multiple sclerosis)    a.) on ofatumumab   Neuromuscular disorder (HCC)    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 - takes gabapentin    Pneumonia    x several   Pre-diabetes    Shingles    Tubular adenoma of colon 08/23/2016   Family History  Problem Relation Age of Onset   Heart disease Mother    Glaucoma Mother    Irregular heart beat Mother    Cardiomyopathy Mother    Colon polyps Mother    Cataracts Mother    Hypertension Father    Heart disease Father    Cancer Father    Esophageal cancer Cousin    Past Surgical History:  Procedure Laterality Date   ABDOMINAL AORTIC ENDOVASCULAR STENT GRAFT N/A 03/03/2021   Procedure: ABDOMINAL AORTIC ENDOVASCULAR STENT GRAFT;  Surgeon: Sheree Penne Bruckner, MD;  Location: [facility] OR;  Service: Vascular;  Laterality: N/A;   ADENOIDECTOMY  1960   APPENDECTOMY  1979   CATARACT EXTRACTION, BILATERAL  03/12/2019   COLONOSCOPY  02/05/2008   COLONOSCOPY WITH PROPOFOL  N/A 08/23/2016   Procedure: COLONOSCOPY WITH PROPOFOL ;  Surgeon: Gaylyn Gladis PENNER, MD;  Location: [facility] ENDOSCOPY;  Service: Endoscopy;  Laterality: N/A;  COLONOSCOPY WITH PROPOFOL  N/A 04/29/2020   Procedure: COLONOSCOPY WITH PROPOFOL ;  Surgeon: Maryruth Ole DASEN, MD;  Location: [facility] ENDOSCOPY;  Service: Endoscopy;  Laterality: N/A;   CORONARY ARTERY BYPASS GRAFT N/A 09/16/2023   Procedure: CORONARY ARTERY BYPASS GRAFTING X 2, USING LEFT INTERNAL MAMMARY ARTERY AND ENDOSCOPIC HARVESTED RIGHT GREATER SAPHENOUS VEIN;  Surgeon: Shyrl Linnie KIDD, MD;  Location: [facility] OR;  Service: Open Heart Surgery;  Laterality: N/A;   CYSTOSCOPY WITH INSERTION OF UROLIFT N/A 05/20/2020   Procedure: CYSTOSCOPY WITH INSERTION OF UROLIFT;  Surgeon: Francisca Redell BROCKS, MD;  Location: [facility] ORS;  Service: Urology;  Laterality: N/A;   ESOPHAGOGASTRODUODENOSCOPY (EGD) WITH PROPOFOL  N/A 08/23/2016   Procedure: ESOPHAGOGASTRODUODENOSCOPY (EGD) WITH PROPOFOL ;  Surgeon: Gaylyn Gladis PENNER, MD;   Location[facility] ENDOSCOPY;  Service: Endoscopy;  Laterality: N/A;   ESOPHAGOGASTRODUODENOSCOPY (EGD) WITH PROPOFOL  N/A 04/29/2020   Procedure: ESOPHAGOGASTRODUODENOSCOPY (EGD) WITH PROPOFOL ;  Surgeon: Maryruth Ole DASEN, MD;  Location: [facility] ENDOSCOPY;  Service: Endoscopy;  Laterality: N/A;   ESOPHAGOGASTRODUODENOSCOPY ENDOSCOPY  02/05/2008   EYE SURGERY     eye trauma  1970   piece of metal to eye removed in clinic   JOINT REPLACEMENT     KNEE ARTHROPLASTY Right 10/05/2020   Procedure: COMPUTER ASSISTED TOTAL KNEE ARTHROPLASTY;  Surgeon: Mardee Lynwood SQUIBB, MD;  Location: [facility] ORS;  Service: Orthopedics;  Laterality: Right;   KNEE ARTHROPLASTY Left 06/08/2022   Procedure: COMPUTER ASSISTED TOTAL KNEE ARTHROPLASTY;  Surgeon: Mardee Lynwood SQUIBB, MD;  Location: [facility] ORS;  Service: Orthopedics;  Laterality: Left;   KNEE ARTHROSCOPY Left 12/11/2021   Procedure: ARTHROSCOPY KNEE;  Surgeon: Mardee Lynwood SQUIBB, MD;  Location: [facility] ORS;  Service: Orthopedics;  Laterality: Left;   KNEE ARTHROSCOPY W/ PARTIAL MEDIAL MENISCECTOMY Right 01/02/2011   LEFT HEART CATH AND CORONARY ANGIOGRAPHY Left 07/14/2007   Procedure: LEFT HEART CATH AND CORONARY ANGIOGRAPHY; Location: [facility]; Surgeon: Miquel Ellen, MD   LEFT HEART CATH AND CORONARY ANGIOGRAPHY N/A 07/26/2023   Procedure: LEFT HEART CATH AND CORONARY ANGIOGRAPHY;  Surgeon: Verlin Lonni BIRCH, MD;  Location: [facility] INVASIVE CV LAB;  Service: Cardiovascular;  Laterality: N/A;   SEPTOPLASTY     TEE WITHOUT CARDIOVERSION N/A 09/16/2023   Procedure: ECHOCARDIOGRAM, TRANSESOPHAGEAL;  Surgeon: Shyrl Linnie KIDD, MD;  Location: [facility] OR;  Service: Open Heart Surgery;  Laterality: N/A;   TONSILLECTOMY  02/05/2008   ULTRASOUND GUIDANCE FOR VASCULAR ACCESS Left 03/03/2021   Procedure: ULTRASOUND GUIDANCE FOR VASCULAR ACCESS, LEFT FEMORAL ARTERY;  Surgeon: Sheree Penne Lonni, MD;  Location[facility] OR;  Service: Vascular;  Laterality: Left;   WRIST FRACTURE SURGERY Right      Short Social History:  Social History   Tobacco Use   Smoking status: Former    Current packs/day: 0.00    Average packs/day: 1 pack/day for 32.5 years (32.5 ttl pk-yrs)    Types: Cigarettes    Start date: 66    Quit date: 09/23/2006    Years since quitting: 17.6    Passive exposure: Current (rarely, just when out in public)   Smokeless tobacco: Never  Substance Use Topics   Alcohol  use: Not Currently    Comment: none in last 15 yrs per pt 09/12/23    Allergies[1]  Current Outpatient Medications  Medication Sig Dispense Refill   acetaminophen  (TYLENOL ) 500 MG tablet Take 2 tablets (1,000 mg total) by mouth every 6 (six) hours as needed for mild pain. 60 tablet 0   amphetamine -dextroamphetamine (ADDERALL XR) 25 MG 24 hr capsule Take 25 mg  by mouth daily as needed (focus).     aspirin  EC 81 MG tablet Take 81 mg by mouth daily. Swallow whole.     baclofen  (LIORESAL ) 20 MG tablet Take 30 mg by mouth 3 (three) times daily.     BREZTRI  AEROSPHERE 160-9-4.8 MCG/ACT AERO USE 2 INHALATIONS IN THE MORNING AND AT BEDTIME 32.1 g 3   buPROPion  (WELLBUTRIN  XL) 300 MG 24 hr tablet Take 300 mg by mouth daily.     doxycycline  (VIBRA -TABS) 100 MG tablet Take 1 tablet (100 mg total) by mouth 2 (two) times daily. 14 tablet 0   gabapentin  (NEURONTIN ) 300 MG capsule Take 1-2 capsules (300-600 mg total) by mouth 2 (two) times daily. 600 mg in am and 300 mg in afternoon 60 capsule 0   montelukast  (SINGULAIR ) 10 MG tablet Take 1 tablet (10 mg total) by mouth at bedtime. 90 tablet 4   Multiple Vitamins-Minerals (MULTIVITAMIN WITH MINERALS) tablet Take 1 tablet by mouth daily.     Ofatumumab (KESIMPTA) 20 MG/0.4ML SOAJ Inject 20 mg into the skin every 30 (thirty) days. 1st day of every month     pantoprazole  (PROTONIX ) 20 MG tablet Take 20 mg by mouth at bedtime.     Polyethyl Glycol-Propyl Glycol (SYSTANE) 0.4-0.3 % SOLN Place 1 drop into both eyes 4 (four) times daily as needed (dry eyes).      predniSONE  (DELTASONE ) 20 MG tablet Take 2 tablets (40 mg total) by mouth daily with breakfast for 5 days, THEN 1 tablet (20 mg total) daily with breakfast for 5 days. 15 tablet 0   propranolol  (INDERAL ) 10 MG tablet Take 1 tablet (10 mg total) by mouth 3 (three) times daily as needed (for tremors). (Patient not taking: Reported on 04/28/2024) 90 tablet 1   rosuvastatin  (CRESTOR ) 40 MG tablet Take 40 mg by mouth at bedtime.     Spacer/Aero-Holding Chambers (AEROCHAMBER MV) inhaler Use as instructed 1 each 0   tamsulosin  (FLOMAX ) 0.4 MG CAPS capsule Take 1 capsule (0.4 mg total) by mouth daily. 90 capsule 3   traZODone  (DESYREL ) 100 MG tablet Take 100 mg by mouth at bedtime.     VENTOLIN  HFA 108 (90 Base) MCG/ACT inhaler Inhale 2 puffs into the lungs every 4 (four) hours as needed for wheezing or shortness of breath. 3 each 4   Vibegron  (GEMTESA ) 75 MG TABS Take 1 tablet (75 mg total) by mouth daily. 90 tablet 3   VITAMIN D  PO Take 1 capsule by mouth daily.     No current facility-administered medications for this visit.    Review of Systems  Constitutional:  Constitutional negative. HENT: HENT negative.  Eyes: Eyes negative.  Respiratory: Respiratory negative.  Cardiovascular: Cardiovascular negative.  GI: Gastrointestinal negative.  Musculoskeletal: Musculoskeletal negative.  Neurological: Neurological negative. Hematologic: Hematologic/lymphatic negative.  Psychiatric: Psychiatric negative.        Objective:  Objective   Vitals:   04/29/24 0830  BP: 115/76  Pulse: 67  Temp: 97.8 F (36.6 C)  SpO2: 92%     Physical Exam HENT:     Head: Normocephalic.     Nose: Nose normal.  Eyes:     Pupils: Pupils are equal, round, and reactive to light.  Cardiovascular:     Rate and Rhythm: Normal rate.     Pulses:          Popliteal pulses are 3+ on the right side and 3+ on the left side.  Pulmonary:     Effort: Pulmonary effort is  normal.  Abdominal:     General: Abdomen is  flat.  Musculoskeletal:        General: Normal range of motion.     Right lower leg: No edema.     Left lower leg: No edema.  Skin:    General: Skin is warm.     Capillary Refill: Capillary refill takes less than 2 seconds.  Neurological:     General: No focal deficit present.     Mental Status: He is alert.  Psychiatric:        Mood and Affect: Mood normal.     Data: CTA ABDOMEN AND PELVIS WITHOUT AND WITH CONTRAST   TECHNIQUE: Multidetector CT imaging of the abdomen and pelvis was performed using the standard protocol during bolus administration of intravenous contrast. Multiplanar reconstructed images and MIPs were obtained and reviewed to evaluate the vascular anatomy.   RADIATION DOSE REDUCTION: This exam was performed according to the departmental dose-optimization program which includes automated exposure control, adjustment of the mA and/or kV according to patient size and/or use of iterative reconstruction technique.   CONTRAST:  80mL OMNIPAQUE  IOHEXOL  350 MG/ML SOLN   COMPARISON:  CT 04/05/2021, 01/05/2021   FINDINGS: VASCULAR   Aorta: Status post endovascular repair of infrarenal abdominal aortic saccular aneurysm. Patent graft. Further decrease in size of previously noted excluded aneurysm sac, on the current exam measures 14 x 6 mm on series 302, image 85, previously 15 x 23 mm. No dissection.   Celiac: Patent without evidence of aneurysm, dissection, vasculitis or significant stenosis.   SMA: Patent without evidence of aneurysm, dissection, vasculitis or significant stenosis.   Renals: Both renal arteries are patent without evidence of aneurysm, dissection, vasculitis, fibromuscular dysplasia or significant stenosis.   IMA: Occluded at the origin. Retrograde opacification of distal branches likely via collateral circulation as before.   Inflow: Patent without evidence of aneurysm, dissection, vasculitis or significant stenosis.   Proximal  Outflow: Bilateral common femoral and visualized portions of the superficial and profunda femoral arteries are patent without evidence of aneurysm, dissection, vasculitis or significant stenosis.   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   Review of the MIP images confirms the above findings.   NON-VASCULAR   Lower chest: Lung bases demonstrate scarring or atelectasis at the bases. No acute airspace disease   Hepatobiliary: No focal liver abnormality is seen. No gallstones, gallbladder wall thickening, or biliary dilatation.   Pancreas: Unremarkable. No pancreatic ductal dilatation or surrounding inflammatory changes.   Spleen: Normal in size without focal abnormality.   Adrenals/Urinary Tract: Adrenal glands are normal. No hydronephrosis. Cysts in the kidneys for which no imaging follow-up is recommended. Right-sided kidney stones measuring up to 13 mm. The bladder is slightly thick walled   Stomach/Bowel: Stomach within normal limits. No dilated small bowel. No acute bowel wall thickening.   Lymphatic: No suspicious lymph nodes.   Reproductive: Multiple prostate seeds. Prostate appears slightly enlarged   Other: No ascites or free air   Musculoskeletal: No acute or suspicious osseous abnormality. Retrolisthesis L3 on L4 with multilevel degenerative change   IMPRESSION: VASCULAR   1. Status post endovascular stent graft repair of saccular aneurysm of the infrarenal abdominal aorta with further decrease in the excluded sac diameter and no complicating features on this exam. 2. Atherosclerosis without significant stenosis. 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   NON-VASCULAR   1. No CT evidence for acute intra-abdominal or pelvic abnormality     Assessment/Plan:  69 year old male with history as above with excluded saccular aneurysm of the infrarenal aorta with very small splenic artery aneurysm that was not commented on by CT.  We reviewed  his CT together today.  Plan will be for follow-up in 2 years with EVAR duplex.  We can consider CT in the future to reevaluate splenic artery aneurysm however we discussed that he is low risk for rupture.  Popliteal arteries remain prominent however normal size by duplex last year.  All questions were answered he demonstrates good understanding we will follow-up in 2 years.     Penne Lonni [place] MD [facility]      [1]  Allergies Allergen Reactions   Quinolones Other (See Comments)    Patient stated he was advised that medications in this drug class may cause a decrease in elasticity of arteries (contraindicated d/t hx of AAA and current aortic route aneurysm    "

## 2024-04-30 ENCOUNTER — Telehealth: Payer: Self-pay

## 2024-04-30 NOTE — Telephone Encounter (Signed)
 [facility] called and left a message that Gemtesa  is the Non-preferred medication due to cost. They stated that Myrbetriq  is a preferred option and they could be called back with that change at [phone]. After calling them back it did not have any cost savings to change patient to Myrbetriq . Both would be $0 out of pocket. I let them know that we will keep patient on current therapy of Gemtesa .

## 2024-06-25 ENCOUNTER — Other Ambulatory Visit (HOSPITAL_COMMUNITY)

## 2025-04-15 ENCOUNTER — Ambulatory Visit: Admitting: Urology
